# Patient Record
Sex: Male | Born: 1955 | Race: Black or African American | Hispanic: No | Marital: Single | Smoking: Former smoker
Health system: Southern US, Community
[De-identification: ages and names within clinical notes are randomized; demographics above are authoritative.]

## PROBLEM LIST (undated history)

## (undated) DIAGNOSIS — G4733 Obstructive sleep apnea (adult) (pediatric): Secondary | ICD-10-CM

## (undated) DIAGNOSIS — I5189 Other ill-defined heart diseases: Secondary | ICD-10-CM

## (undated) DIAGNOSIS — E119 Type 2 diabetes mellitus without complications: Secondary | ICD-10-CM

## (undated) DIAGNOSIS — Z9289 Personal history of other medical treatment: Secondary | ICD-10-CM

## (undated) DIAGNOSIS — I10 Essential (primary) hypertension: Secondary | ICD-10-CM

## (undated) DIAGNOSIS — I2510 Atherosclerotic heart disease of native coronary artery without angina pectoris: Secondary | ICD-10-CM

## (undated) DIAGNOSIS — E785 Hyperlipidemia, unspecified: Secondary | ICD-10-CM

## (undated) HISTORY — PX: HERNIA REPAIR: SHX51

## (undated) HISTORY — DX: Hyperlipidemia, unspecified: E78.5

## (undated) HISTORY — DX: Type 2 diabetes mellitus without complications: E11.9

## (undated) HISTORY — DX: Other ill-defined heart diseases: I51.89

---

## 2015-01-05 ENCOUNTER — Observation Stay
Admission: EM | Admit: 2015-01-05 | Discharge: 2015-01-09 | Disposition: A | Discharge status: Other Acute Inpatient Hospital | Payer: Medicaid Other | Attending: Internal Medicine | Admitting: Internal Medicine

## 2015-01-05 ENCOUNTER — Encounter: Payer: Self-pay | Admitting: *Deleted

## 2015-01-05 ENCOUNTER — Emergency Department: Payer: Medicaid Other

## 2015-01-05 DIAGNOSIS — Z6841 Body Mass Index (BMI) 40.0 and over, adult: Secondary | ICD-10-CM | POA: Diagnosis not present

## 2015-01-05 DIAGNOSIS — I25110 Atherosclerotic heart disease of native coronary artery with unstable angina pectoris: Secondary | ICD-10-CM | POA: Diagnosis not present

## 2015-01-05 DIAGNOSIS — M25512 Pain in left shoulder: Secondary | ICD-10-CM | POA: Diagnosis not present

## 2015-01-05 DIAGNOSIS — I10 Essential (primary) hypertension: Secondary | ICD-10-CM | POA: Diagnosis not present

## 2015-01-05 DIAGNOSIS — E119 Type 2 diabetes mellitus without complications: Secondary | ICD-10-CM | POA: Insufficient documentation

## 2015-01-05 DIAGNOSIS — I252 Old myocardial infarction: Secondary | ICD-10-CM | POA: Diagnosis not present

## 2015-01-05 DIAGNOSIS — I200 Unstable angina: Secondary | ICD-10-CM | POA: Diagnosis present

## 2015-01-05 DIAGNOSIS — G4733 Obstructive sleep apnea (adult) (pediatric): Secondary | ICD-10-CM | POA: Insufficient documentation

## 2015-01-05 DIAGNOSIS — I259 Chronic ischemic heart disease, unspecified: Secondary | ICD-10-CM | POA: Diagnosis not present

## 2015-01-05 DIAGNOSIS — R079 Chest pain, unspecified: Secondary | ICD-10-CM | POA: Diagnosis not present

## 2015-01-05 DIAGNOSIS — E1151 Type 2 diabetes mellitus with diabetic peripheral angiopathy without gangrene: Secondary | ICD-10-CM | POA: Diagnosis present

## 2015-01-05 DIAGNOSIS — Z8249 Family history of ischemic heart disease and other diseases of the circulatory system: Secondary | ICD-10-CM | POA: Diagnosis not present

## 2015-01-05 DIAGNOSIS — E785 Hyperlipidemia, unspecified: Secondary | ICD-10-CM | POA: Insufficient documentation

## 2015-01-05 DIAGNOSIS — R9431 Abnormal electrocardiogram [ECG] [EKG]: Secondary | ICD-10-CM | POA: Diagnosis not present

## 2015-01-05 HISTORY — DX: Personal history of other medical treatment: Z92.89

## 2015-01-05 HISTORY — DX: Morbid (severe) obesity due to excess calories: E66.01

## 2015-01-05 HISTORY — DX: Obstructive sleep apnea (adult) (pediatric): G47.33

## 2015-01-05 HISTORY — DX: Essential (primary) hypertension: I10

## 2015-01-05 HISTORY — DX: Atherosclerotic heart disease of native coronary artery without angina pectoris: I25.10

## 2015-01-05 MED ORDER — ONDANSETRON HCL 4 MG PO TABS
4.0000 mg | ORAL_TABLET | Freq: Four times a day (QID) | ORAL | Status: DC | PRN
Start: 2015-01-05 — End: 2015-01-09

## 2015-01-05 MED ORDER — NITROGLYCERIN 2 % TD OINT
0.5000 [in_us] | TOPICAL_OINTMENT | Freq: Once | TRANSDERMAL | Status: AC
  Administered 2015-01-05: 0.5 [in_us] via TOPICAL

## 2015-01-05 MED ORDER — SODIUM CHLORIDE 0.9 % IJ SOLN
3.0000 mL | Freq: Two times a day (BID) | INTRAMUSCULAR | Status: DC
Start: 2015-01-05 — End: 2015-01-09
  Administered 2015-01-06 – 2015-01-08 (×5): 3 mL via INTRAVENOUS

## 2015-01-05 MED ORDER — ACETAMINOPHEN 325 MG PO TABS
650.0000 mg | ORAL_TABLET | Freq: Four times a day (QID) | ORAL | Status: DC | PRN
Start: 2015-01-05 — End: 2015-01-09
  Administered 2015-01-07: 650 mg via ORAL
  Filled 2015-01-05: qty 2

## 2015-01-05 MED ORDER — SODIUM CHLORIDE 0.9 % IV SOLN
Freq: Once | INTRAVENOUS | Status: AC
  Administered 2015-01-05: 22:00:00 via INTRAVENOUS

## 2015-01-05 MED ORDER — ASPIRIN EC 81 MG PO TBEC
81.0000 mg | DELAYED_RELEASE_TABLET | Freq: Every day | ORAL | Status: DC
Start: 2015-01-06 — End: 2015-01-09
  Administered 2015-01-06 – 2015-01-09 (×4): 81 mg via ORAL
  Filled 2015-01-05 (×4): qty 1

## 2015-01-05 MED ORDER — ACETAMINOPHEN 650 MG RE SUPP
650.0000 mg | Freq: Four times a day (QID) | RECTAL | Status: DC | PRN
Start: 2015-01-05 — End: 2015-01-09

## 2015-01-05 MED ORDER — ONDANSETRON HCL 4 MG/2ML IJ SOLN: 4.0000 mg | Freq: Four times a day (QID) | INTRAMUSCULAR | Status: DC | PRN

## 2015-01-05 MED ORDER — NITROGLYCERIN 2 % TD OINT
TOPICAL_OINTMENT | TRANSDERMAL | Status: AC
  Filled 2015-01-05: qty 1

## 2015-01-05 MED ORDER — MORPHINE SULFATE 2 MG/ML IJ SOLN: 2.0000 mg | INTRAMUSCULAR | Status: DC | PRN

## 2015-01-05 MED ORDER — HEPARIN SODIUM (PORCINE) 5000 UNIT/ML IJ SOLN
5000.0000 [IU] | Freq: Three times a day (TID) | INTRAMUSCULAR | Status: DC
  Administered 2015-01-06 – 2015-01-09 (×12): 5000 [IU] via SUBCUTANEOUS
  Filled 2015-01-05 (×11): qty 1

## 2015-01-05 NOTE — ED Provider Notes (Signed)
[facility] Emergency Department Provider Note  ____________________________________________  Time seen: Approximately 11:07 PM  I have reviewed the triage vital signs and the nursing notes.   HISTORY  Chief Complaint Chest Pain    HPI Craig Harvey is a 59 y.o. male who reports he developed pain in his chest and left shoulder starting last week while he was walking. The pain comes on with shortness of breath and some lightheadedness it appears to get worse with exercise and better when he rests. It also hurts worse if he moves his left arm. HEENT she is becoming more intense and come on after less exercise in the last few days. The patient does not smoke. He does have a history of hypertension. The pain is heavy and tight in nature. Currently there is no pain. Although there was some pain with any came into the emergency room. He reported there was pain initially and went away with nitroglycerin in EMS and then came back again patient has had aspirin with EMS.   Past Medical History  Diagnosis Date  . Hypertension     Patient Active Problem List   Diagnosis Date Noted  . Chest pain, central 01/05/2015    Past Surgical History  Procedure Laterality Date  . Hernia repair      No current outpatient prescriptions on file.  Allergies Review of patient's allergies indicates no known allergies.  Family History  Problem Relation Age of Onset  . Diabetes Neg Hx   . CAD Mother   . CAD Brother     Social History History  Substance Use Topics  . Smoking status: Never Smoker   . Smokeless tobacco: Not on file  . Alcohol Use: No    Review of Systems Constitutional: No fever/chills Eyes: No visual changes. ENT: No sore throat. Cardiovascular: Denies chest pain. Respiratory: Denies shortness of breath. Gastrointestinal: No abdominal pain.  No nausea, no vomiting.  No diarrhea.  No constipation. Genitourinary: Negative for  dysuria. Musculoskeletal: Negative for back pain. Skin: Negative for rash. Neurological: Negative for headaches, focal weakness or numbness.  10-point ROS otherwise negative.  ____________________________________________   PHYSICAL EXAM:  VITAL SIGNS: ED Triage Vitals  Enc Vitals Group     BP 01/05/15 2201 174/92 mmHg     Pulse Rate 01/05/15 2201 101     Resp 01/05/15 2201 16     Temp 01/05/15 2201 99.5 F (37.5 C)     Temp Source 01/05/15 2201 Oral     SpO2 01/05/15 2201 96 %     Weight 01/05/15 2201 320 lb (145.151 kg)     Height 01/05/15 2201 5\' 9"  (1.753 m)     Head Cir --      Peak Flow --      Pain Score --      Pain Loc --      Pain Edu? --      Excl. in GC? --    Constitutional: Alert and oriented. Well appearing and in no acute distress. Eyes: Conjunctivae are normal. PERRL. EOMI. Head: Atraumatic. Nose: No congestion/rhinnorhea. Mouth/Throat: Mucous membranes are moist.  Oropharynx non-erythematous. Neck: No stridor.  Cardiovascular: Normal rate, regular rhythm. Grossly normal heart sounds.  Good peripheral circulation. Respiratory: Normal respiratory effort.  No retractions. Lungs CTAB. Gastrointestinal: Soft and nontender. No distention. No abdominal bruits. No CVA tenderness. Musculoskeletal: No lower extremity tenderness nor edema.  No joint effusions. Neurologic:  Normal speech and language. No gross focal neurologic deficits are appreciated.  Speech is normal. No gait instability. Skin:  Skin is warm, dry and intact. No rash noted. Psychiatric: Mood and affect are normal. Speech and behavior are normal.  ____________________________________________   LABS (all labs ordered are listed, but only abnormal results are displayed)  Labs Reviewed  COMPREHENSIVE METABOLIC PANEL - Abnormal; Notable for the following:    Sodium 132 (*)    Chloride 97 (*)    Glucose, Bld 240 (*)    AST 86 (*)    ALT 102 (*)    All other components within normal limits   CBC WITH DIFFERENTIAL/PLATELET - Abnormal; Notable for the following:    Platelets 135 (*)    All other components within normal limits  TROPONIN I  TROPONIN I  TROPONIN I  TROPONIN I   ____________________________________________  EKG  EKG read and interpreted by me shows sinus tach at a rate of 101 normal axis he has flattened T waves in lead 2 and flipped T waves in lead III and F possibly inferior ischemic changes is also flattening in V5 and V6 of the T waves ____________________________________________  RADIOLOGY  Radiology reads chest x-ray is normal   PROCEDURES  ____________________________________________   INITIAL IMPRESSION / ASSESSMENT AND PLAN / ED COURSE  Pertinent labs & imaging results that were available during my care of the patient were reviewed by me and considered in my medical decision making (see chart for details).   ____________________________________________   FINAL CLINICAL IMPRESSION(S) / ED DIAGNOSES  Final diagnoses:  Crescendo angina      Arnaldo Natal, MD 01/06/15 [phone]

## 2015-01-05 NOTE — H&P (Signed)
[facility] - [place] at [facility]   PATIENT NAME: Craig Harvey    MR#:  [id number]  DATE OF BIRTH:  06/25/1956   DATE OF ADMISSION:  01/05/2015  PRIMARY CARE PHYSICIAN: No primary care provider on file.   REQUESTING/REFERRING PHYSICIAN: Malinda  CHIEF COMPLAINT:   Chief Complaint  Patient presents with  . Chest Pain    HISTORY OF PRESENT ILLNESS:  Craig Harvey  is a 59 y.o. male with a known history of essential hypertension who is presenting with chest pain. Patient presented complaining of chest pain which is retrosternal in location, described as tightness in quality, 7 out of 10 intensity, radiation to left sided chest shoulder no worsening factors some relief with rest. Describes chest pain as exertional in nature worsening in intensity over the last 1 day. He had intermittent symptoms for about one week and total however as intensity worsens at present to Hospital further workup and evaluation. Denies any shortness of breath, nausea, vomiting, diaphoresis for this pathology at this time currently chest pain-free  PAST MEDICAL HISTORY:   Past Medical History  Diagnosis Date  . Hypertension     PAST SURGICAL HISTORY:   Past Surgical History  Procedure Laterality Date  . Hernia repair      SOCIAL HISTORY:   History  Substance Use Topics  . Smoking status: Never Smoker   . Smokeless tobacco: Not on file  . Alcohol Use: No    FAMILY HISTORY:   Family History  Problem Relation Age of Onset  . Diabetes Neg Hx   . CAD Mother   . CAD Brother     DRUG ALLERGIES:  No Known Allergies  REVIEW OF SYSTEMS:  REVIEW OF SYSTEMS:  CONSTITUTIONAL: Denies fevers, chills, fatigue, weakness.  EYES: Denies blurred vision, double vision, or eye pain.  EARS, NOSE, THROAT: Denies tinnitus, ear pain, hearing loss.  RESPIRATORY: denies cough, shortness of breath, wheezing  CARDIOVASCULAR: Positive chest pain, denies palpitations, edema.   GASTROINTESTINAL: Denies nausea, vomiting, diarrhea, abdominal pain.  GENITOURINARY: Denies dysuria, hematuria.  ENDOCRINE: Denies nocturia or thyroid problems. HEMATOLOGIC AND LYMPHATIC: Denies easy bruising or bleeding.  SKIN: Denies rash or lesions.  MUSCULOSKELETAL: Denies pain in neck, back, shoulder, knees, hips, or further arthritic symptoms.  NEUROLOGIC: Denies paralysis, paresthesias.  PSYCHIATRIC: Denies anxiety or depressive symptoms. Otherwise full review of systems performed by me is negative.   MEDICATIONS AT HOME:   Prior to Admission medications   Not on File      VITAL SIGNS:  Blood pressure 148/93, pulse 92, temperature 99.5 F (37.5 C), temperature source Oral, resp. rate 22, height  (1.753 m), weight 320 lb (145.151 kg), SpO2 97 %.  PHYSICAL EXAMINATION:  VITAL SIGNS: Filed Vitals:   01/05/15 2300  BP: 148/93  Pulse: 92  Temp:   Resp: 22   GENERAL:58 y.o.male currently in no acute distress.  HEAD: Normocephalic, atraumatic.  EYES: Pupils equal, round, reactive to light. Extraocular muscles intact. No scleral icterus.  MOUTH: Moist mucosal membrane. Dentition intact. No abscess noted.  EAR, NOSE, THROAT: Clear without exudates. No external lesions.  NECK: Supple. No thyromegaly. No nodules. No JVD.  PULMONARY: Clear to ascultation, without wheeze rails or rhonci. No use of accessory muscles, Good respiratory effort. good air entry bilaterally CHEST: Nontender to palpation.  CARDIOVASCULAR: S1 and S2. Regular rate and rhythm. No murmurs, rubs, or gallops. No edema. Pedal pulses 2+ bilaterally.  GASTROINTESTINAL: Soft, nontender, nondistended. No masses. Positive bowel  sounds. No hepatosplenomegaly.  MUSCULOSKELETAL: No swelling, clubbing, or edema. Range of motion full in all extremities. Able to elicit some pain left shoulder with abduction and internal rotation however minimal and not in the same spot/quality compared to previous  symptoms NEUROLOGIC: Cranial nerves II through XII are intact. No gross focal neurological deficits. Sensation intact. Reflexes intact.  SKIN: No ulceration, lesions, rashes, or cyanosis. Skin warm and dry. Turgor intact.  PSYCHIATRIC: Mood, affect within normal limits. The patient is awake, alert and oriented x 3. Insight, judgment intact.    LABORATORY PANEL:   CBC No results for input(s): WBC, HGB, HCT, PLT in the last 168 hours. ------------------------------------------------------------------------------------------------------------------  Chemistries  No results for input(s): NA, K, CL, CO2, GLUCOSE, BUN, CREATININE, CALCIUM, MG, AST, ALT, ALKPHOS, BILITOT in the last 168 hours.  Invalid input(s): GFRCGP ------------------------------------------------------------------------------------------------------------------  Cardiac Enzymes No results for input(s): TROPONINI in the last 168 hours. ------------------------------------------------------------------------------------------------------------------  RADIOLOGY:  Dg Chest Portable 1 View  01/05/2015   CLINICAL DATA:  Intermittent chest pain for 3 days. History of hypertension.  EXAM: PORTABLE CHEST - 1 VIEW  COMPARISON:  None.  FINDINGS: The heart size and mediastinal contours are within normal limits. Both lungs are clear. The visualized skeletal structures are unremarkable.  IMPRESSION: Normal portable chest radiograph.   Electronically Signed   By: Awilda Metroourtnay  Bloomer M.D.   On: 01/05/2015 22:39    EKG:   Orders placed or performed during the hospital encounter of 01/05/15  . ED EKG  . ED EKG    IMPRESSION AND PLAN:   59 year old gentleman history of essential hypertension with chest pain.  1. Chest pain, central: Aspirin statin therapy, place on telemetry, trend cardiac enzymes 3, exercise stress test if enzymes are normal 2. Venous thromboembolism prophylactic: Heparin subcutaneous    All the records are  reviewed and case discussed with ED provider. Management plans discussed with the patient, family and they are in agreement.  CODE STATUS: Full  TOTAL TIME TAKING CARE OF THIS PATIENT: 35 minutes.    Hower,  Mardi MainlandDavid K M.D on 01/05/2015 at 11:55 PM  Between 7am to 6pm - Pager - [phone]  After 6pm: House Pager: - [phone]  [facility]  Office  [phone]  CC: Primary care physician; No primary care provider on file.

## 2015-01-05 NOTE — ED Notes (Signed)
MD Malinda at bedside at this time.  

## 2015-01-05 NOTE — ED Notes (Signed)
Pt to ED from home with chest pain intermittent since Friday. Pt denies SOB, n/v, diaphoresis. Pt states pain left upper central chest, "tightness" Pt given 325 mg aspirin and 1 nitro and feeling better since then. Pt AAOx3, vitals wnl. Pt with hx of hypertension, under extra stress recently.

## 2015-01-06 ENCOUNTER — Observation Stay (HOSPITAL_BASED_OUTPATIENT_CLINIC_OR_DEPARTMENT_OTHER)
Admit: 2015-01-06 | Discharge: 2015-01-06 | Disposition: A | Discharge status: Home / Self Care | Payer: Medicaid Other | Attending: Cardiovascular Disease | Admitting: Cardiovascular Disease

## 2015-01-06 DIAGNOSIS — G4733 Obstructive sleep apnea (adult) (pediatric): Secondary | ICD-10-CM | POA: Insufficient documentation

## 2015-01-06 DIAGNOSIS — E119 Type 2 diabetes mellitus without complications: Secondary | ICD-10-CM

## 2015-01-06 DIAGNOSIS — E1151 Type 2 diabetes mellitus with diabetic peripheral angiopathy without gangrene: Secondary | ICD-10-CM | POA: Diagnosis present

## 2015-01-06 DIAGNOSIS — Z6841 Body Mass Index (BMI) 40.0 and over, adult: Secondary | ICD-10-CM

## 2015-01-06 DIAGNOSIS — R9431 Abnormal electrocardiogram [ECG] [EKG]: Secondary | ICD-10-CM

## 2015-01-06 DIAGNOSIS — R079 Chest pain, unspecified: Secondary | ICD-10-CM

## 2015-01-06 LAB — CBC WITH DIFFERENTIAL/PLATELET
BASOS ABS: 0.1 10*3/uL (ref 0–0.1)
Basophils Relative: 2 %
Eosinophils Absolute: 0.1 10*3/uL (ref 0–0.7)
Eosinophils Relative: 2 %
HEMATOCRIT: 44.1 % (ref 40.0–52.0)
Hemoglobin: 14.4 g/dL (ref 13.0–18.0)
Lymphocytes Relative: 28 %
Lymphs Abs: 1.5 10*3/uL (ref 1.0–3.6)
MCH: 28.6 pg (ref 26.0–34.0)
MCHC: 32.5 g/dL (ref 32.0–36.0)
MCV: 88 fL (ref 80.0–100.0)
Monocytes Absolute: 0.5 10*3/uL (ref 0.2–1.0)
Monocytes Relative: 8 %
NEUTROS ABS: 3.3 10*3/uL (ref 1.4–6.5)
NEUTROS PCT: 60 %
Platelets: 135 10*3/uL — ABNORMAL LOW (ref 150–440)
RBC: 5.01 MIL/uL (ref 4.40–5.90)
RDW: 14.3 % (ref 11.5–14.5)
WBC: 5.6 10*3/uL (ref 3.8–10.6)

## 2015-01-06 LAB — COMPREHENSIVE METABOLIC PANEL
ALT: 102 U/L — ABNORMAL HIGH (ref 17–63)
AST: 86 U/L — AB (ref 15–41)
Albumin: 4.1 g/dL (ref 3.5–5.0)
Alkaline Phosphatase: 71 U/L (ref 38–126)
Anion gap: 10 (ref 5–15)
BILIRUBIN TOTAL: 0.5 mg/dL (ref 0.3–1.2)
BUN: 11 mg/dL (ref 6–20)
CALCIUM: 8.9 mg/dL (ref 8.9–10.3)
CHLORIDE: 97 mmol/L — AB (ref 101–111)
CO2: 25 mmol/L (ref 22–32)
Creatinine, Ser: 0.92 mg/dL (ref 0.61–1.24)
GFR calc non Af Amer: >60 mL/min (ref >60)
GLUCOSE: 240 mg/dL — AB (ref 65–99)
POTASSIUM: 3.7 mmol/L (ref 3.5–5.1)
Sodium: 132 mmol/L — ABNORMAL LOW (ref 135–145)
Total Protein: 7.7 g/dL (ref 6.5–8.1)

## 2015-01-06 LAB — TROPONIN I
Troponin I: <0.03 ng/mL (ref <0.031)
Troponin I: <0.03 ng/mL (ref <0.031)

## 2015-01-06 MED ORDER — NITROGLYCERIN 0.4 MG SL SUBL
0.4000 mg | SUBLINGUAL_TABLET | SUBLINGUAL | Status: DC | PRN
Start: 2015-01-06 — End: 2015-01-09

## 2015-01-06 MED ORDER — HEPARIN SODIUM (PORCINE) 5000 UNIT/ML IJ SOLN
INTRAMUSCULAR | Status: AC
  Filled 2015-01-06: qty 1

## 2015-01-06 MED ORDER — METOPROLOL SUCCINATE ER 25 MG PO TB24
25.0000 mg | ORAL_TABLET | Freq: Every day | ORAL | Status: DC
  Administered 2015-01-06 – 2015-01-09 (×4): 25 mg via ORAL
  Filled 2015-01-06 (×4): qty 1

## 2015-01-06 NOTE — ED Notes (Signed)
Report given to 2A 

## 2015-01-06 NOTE — H&P (Addendum)
[facility] - [place] at [facility]  Progress note   PATIENT NAME: Craig Harvey    MR#:  [id number]  DATE OF BIRTH:  [birth year]  SUBJECTIVE:  CHIEF COMPLAINT:   Chief Complaint  Patient presents with  . Chest Pain   No further chest pain today at rest. Has typical symptoms  left-sided pain radiating to left arm on exertion. Relieved with rest. REVIEW OF SYSTEMS:    Review of Systems  Constitutional: Negative for fever and chills.  HENT: Negative for sore throat.   Eyes: Negative for blurred vision, double vision and pain.  Respiratory: Negative for cough, hemoptysis, shortness of breath and wheezing.   Cardiovascular: Positive for chest pain (now resolved). Negative for palpitations, orthopnea and leg swelling.  Gastrointestinal: Negative for heartburn, nausea, vomiting, abdominal pain, diarrhea and constipation.  Genitourinary: Negative for dysuria and hematuria.  Musculoskeletal: Negative for back pain and joint pain.  Skin: Negative for rash.  Neurological: Negative for sensory change, speech change, focal weakness and headaches.  Endo/Heme/Allergies: Does not bruise/bleed easily.  Psychiatric/Behavioral: Negative for depression. The patient is not nervous/anxious.       DRUG ALLERGIES:  No Known Allergies  VITALS:  Blood pressure 149/88, pulse 71, temperature 97.7 F (36.5 C), temperature source Oral, resp. rate 20, height  (1.753 m), weight 145.151 kg (320 lb), SpO2 99 %.  PHYSICAL EXAMINATION:   Physical Exam  GENERAL:  59 y.o.-year-old patient lying in the bed with no acute distress. OBESE EYES: Pupils equal, round, reactive to light and accommodation. No scleral icterus. Extraocular muscles intact.  HEENT: Head atraumatic, normocephalic. Oropharynx and nasopharynx clear.  NECK:  Supple, no jugular venous distention. No thyroid enlargement, no tenderness.  LUNGS: Normal breath sounds bilaterally, no wheezing, rales, rhonchi. No  use of accessory muscles of respiration.  CARDIOVASCULAR: S1, S2 normal. No murmurs, rubs, or gallops.  ABDOMEN: Soft, nontender, nondistended. Bowel sounds present. No organomegaly or mass.  EXTREMITIES: No cyanosis, clubbing or edema b/l.    NEUROLOGIC: Cranial nerves II through XII are intact. No focal Motor or sensory deficits b/l.   PSYCHIATRIC: The patient is alert and oriented x 3.  SKIN: No obvious rash, lesion, or ulcer.    LABORATORY PANEL:   CBC  Recent Labs Lab 01/05/15 2206  WBC 5.6  HGB 14.4  HCT 44.1  PLT 135*   ------------------------------------------------------------------------------------------------------------------  Chemistries   Recent Labs Lab 01/05/15 2206  NA 132*  K 3.7  CL 97*  CO2 25  GLUCOSE 240*  BUN 11  CREATININE 0.92  CALCIUM 8.9  AST 86*  ALT 102*  ALKPHOS 71  BILITOT 0.5   ------------------------------------------------------------------------------------------------------------------  Cardiac Enzymes  Recent Labs Lab 01/06/15 0703  TROPONINI <0.03   ------------------------------------------------------------------------------------------------------------------  RADIOLOGY:  Dg Chest Portable 1 View  01/05/2015   CLINICAL DATA:  Intermittent chest pain for 3 days. History of hypertension.  EXAM: PORTABLE CHEST - 1 VIEW  COMPARISON:  None.  FINDINGS: The heart size and mediastinal contours are within normal limits. Both lungs are clear. The visualized skeletal structures are unremarkable.  IMPRESSION: Normal portable chest radiograph.   Electronically Signed   By: Awilda Metro M.D.   On: 01/05/2015 22:39     ASSESSMENT AND PLAN:   59 year old obese patient with untreated hypertension and sleep apnea admitted for chest pain  * Typical chest pain Exertional, left-sided, radiates to his left arm. Significant risk factors. Although his troponin and EKG are normal which are reassuring I believe  his typical chest  pain needs further investigation. I discussed with Dr. Mariah MillingGollan regarding a cardiac catheterization. Further management as per cardiology recommendation. Start aspirin, statin, beta blocker. Nitroglycerin when necessary  * Hypertension Start Toprol. Add hydrochlorothiazide if still uncontrolled.  * Sleep apnea Patient is on CPAP at home.  All the records are reviewed and case discussed with Care Management/Social Workerr. Management plans discussed with the patient, family and they are in agreement.  CODE STATUS: FULL  DVT Prophylaxis: SCDs  TOTAL TIME TAKING CARE OF THIS PATIENT: 40 minutes.   POSSIBLE D/C IN 1-2 DAYS, DEPENDING ON CLINICAL CONDITION.   Milagros LollSudini, Merik Mignano R M.D on 01/06/2015 at 10:59 AM  Between 7am to 6pm - Pager - [phone]  After 6pm go to [email or web address] - password [email or web address] [facility]  [facility]  Office  [phone]  CC: Primary care physician; No primary care provider on file.

## 2015-01-06 NOTE — Consult Note (Signed)
Cardiology Consultation Note  Patient ID: Craig Harvey, MRN: [id number], DOB/AGE: 57-Jun-1957 57 y.o. Admit date: 01/05/2015   Date of Consult: 01/06/2015 Primary Physician: No primary care provider on file. Primary Cardiologist: none  Chief Complaint: nasuea, hot, left shoulder pain Reason for Consult: chest and left shoulder pain  HPI: 59 y.o. male with h/o known history of essential hypertension who is presenting with nausea, feeling hot, left shoulder and left chest pain.Cardiology consulted for left side chest pain, possible angina.  He reports lifting heavy newspapers several weeks ago, 4 bags, each was 100 to 150 pounds. He is uncertain, but his left shoulder and chest pain seemed to start after that time. He lifts weight, and has noticed that doing standing butterflies also hurts in the same area. He goes walking daily, typically with no problems, recently felt hot. He reports mother is a Ecologist"hoarder" and barely enough room to get around in his room. Minimal AC in the house. Brother sits in front of his [facility] all day. He is very uncomfortably hot without AC, yesterday was hot and developed nausea, felt like he was going to pass out.  Since he has been in the hospital, he was felt well with no sx of SOB, feeling hot or chest pain. Cardiac enz neg x 3   Past Medical History  Diagnosis Date  . Hypertension       Most Recent Cardiac Studies: ECHO pending   Surgical History:  Past Surgical History  Procedure Laterality Date  . Hernia repair       Home Meds: Prior to Admission medications   Not on File    Inpatient Medications:  . aspirin EC  81 mg Oral Daily  . heparin      . heparin      . heparin  5,000 Units Subcutaneous 3 times per day  . metoprolol succinate  25 mg Oral Daily  . sodium chloride  3 mL Intravenous Q12H      Allergies: No Known Allergies  History   Social History  . Marital Status: Single    Spouse Name: N/A  . Number of Children: N/A  . Years  of Education: N/A   Occupational History  . Not on file.   Social History Main Topics  . Smoking status: Never Smoker   . Smokeless tobacco: Not on file  . Alcohol Use: No  . Drug Use: Not on file  . Sexual Activity: Not on file   Other Topics Concern  . Not on file   Social History Narrative  . No narrative on file     Family History  Problem Relation Age of Onset  . Diabetes Neg Hx   . CAD Mother   . CAD Brother      Review of Systems:NSR rate 82 bpm Review of Systems  Constitutional: Negative.   Respiratory: Negative.   Cardiovascular: Positive for chest pain.  Gastrointestinal: Positive for nausea.  Musculoskeletal: Positive for myalgias.  Neurological: Negative.    All other systems were reviewed and negative.   Labs:  Recent Labs  01/05/15 2206 01/06/15 0144 01/06/15 0703  TROPONINI <0.03 <0.03 <0.03   Lab Results  Component Value Date   WBC 5.6 01/05/2015   HGB 14.4 01/05/2015   HCT 44.1 01/05/2015   MCV 88.0 01/05/2015   PLT 135* 01/05/2015    Recent Labs Lab 01/05/15 2206  NA 132*  K 3.7  CL 97*  CO2 25  BUN 11  CREATININE 0.92  CALCIUM 8.9  PROT 7.7  BILITOT 0.5  ALKPHOS 71  ALT 102*  AST 86*  GLUCOSE 240*   No results found for: CHOL, HDL, LDLCALC, TRIG No results found for: DDIMER  Radiology/Studies:  Dg Chest Portable 1 View  01/05/2015   CLINICAL DATA:  Intermittent chest pain for 3 days. History of hypertension.  EXAM: PORTABLE CHEST - 1 VIEW  COMPARISON:  None.  FINDINGS: The heart size and mediastinal contours are within normal limits. Both lungs are clear. The visualized skeletal structures are unremarkable.  IMPRESSION: Normal portable chest radiograph.   Electronically Signed   By: Awilda Metro M.D.   On: 01/05/2015 22:39    EKG: NSR with possible old inferior MI  Weights: Filed Weights   01/05/15 2201  Weight: 320 lb (145.151 kg)     Physical Exam:NSR ON TELE  Blood pressure 149/88, pulse 71, temperature  97.7 F (36.5 C), temperature source Oral, resp. rate 20, height  (1.753 m), weight 320 lb (145.151 kg), SpO2 99 %. Body mass index is 47.23 kg/(m^2). General: Well developed, well nourished, in no acute distress. Head: Normocephalic, atraumatic, sclera non-icteric, no xanthomas, nares are without discharge.  Neck: Negative for carotid bruits. JVD not elevated. Lungs: Clear bilaterally to auscultation without wheezes, rales, or rhonchi. Breathing is unlabored. Heart: RRR with S1 S2. No murmurs, rubs, or gallops appreciated. Abdomen: Soft, non-tender, non-distended with normoactive bowel sounds. No hepatomegaly. No rebound/guarding. No obvious abdominal masses. Msk:  Strength and tone appear normal for age. Extremities: No clubbing or cyanosis. No edema.  Distal pedal pulses are 2+ and equal bilaterally. Neuro: Alert and oriented X 3. No facial asymmetry. No focal deficit. Moves all extremities spontaneously. Psych:  Responds to questions appropriately with a normal affect.    Assessment and Plan:  1) Chest pain, Some atypical features, exacerbated by weight lifting, carrying wet papers, carrying jugs of water Left pectoral area, some discomfort on palpation at rest family hx of CAD, ABN EKG (inferior area),  --To rule out ischemia will order a treadmill myoview tomorrow.  2) Nausea, Felt hot yesterday No AC in his house, mom does not turn it on, house is hot Felt like he was going to pass out (main reason he came in to hospital) He and his other brother live with mom  3) HTN: He has no PMD No primary care Not on meds to the notes On metoprolol here, Could add amlodipine 10 mg daily  4) OSA: Does not use CPAP. Often wakes up feeling SOB Recommended compliance  5)Morbid obesity: Likely contributing to most of his medical issues: OSA, HTN, diabetes  6) Diabetes: Possibly undiagnosed, Needs HGA1C   Signed, Dossie Arbour [facility] 01/06/2015, 10:57 AM

## 2015-01-07 ENCOUNTER — Observation Stay: Payer: Medicaid Other

## 2015-01-07 ENCOUNTER — Other Ambulatory Visit: Payer: Self-pay

## 2015-01-07 LAB — GLUCOSE, CAPILLARY
GLUCOSE-CAPILLARY: 189 mg/dL — AB (ref 65–99)
GLUCOSE-CAPILLARY: 253 mg/dL — AB (ref 65–99)
Glucose-Capillary: 193 mg/dL — ABNORMAL HIGH (ref 65–99)
Glucose-Capillary: 135 mg/dL — ABNORMAL HIGH (ref 65–99)

## 2015-01-07 LAB — HEMOGLOBIN A1C: Hgb A1c MFr Bld: 10.1 % — ABNORMAL HIGH (ref 4.0–6.0)

## 2015-01-07 MED ORDER — ASPIRIN 81 MG PO TBEC: 81.0000 mg | DELAYED_RELEASE_TABLET | Freq: Every day | ORAL | Status: DC

## 2015-01-07 MED ORDER — AMLODIPINE BESYLATE 5 MG PO TABS: 5.0000 mg | ORAL_TABLET | Freq: Every day | ORAL | Status: DC

## 2015-01-07 MED ORDER — METFORMIN HCL 1000 MG PO TABS: 1000.0000 mg | ORAL_TABLET | Freq: Two times a day (BID) | ORAL | Status: DC

## 2015-01-07 MED ORDER — INSULIN ASPART 100 UNIT/ML ~~LOC~~ SOLN: 0.0000 [IU] | Freq: Every day | SUBCUTANEOUS | Status: DC

## 2015-01-07 MED ORDER — TECHNETIUM TC 99M SESTAMIBI - CARDIOLITE
30.6050 | Freq: Once | INTRAVENOUS | Status: AC | PRN
  Administered 2015-01-07: 30.605 via INTRAVENOUS

## 2015-01-07 MED ORDER — METOPROLOL SUCCINATE ER 25 MG PO TB24: 25.0000 mg | ORAL_TABLET | Freq: Every day | ORAL | Status: DC

## 2015-01-07 MED ORDER — METFORMIN HCL 500 MG PO TABS
1000.0000 mg | ORAL_TABLET | Freq: Two times a day (BID) | ORAL | Status: DC
  Administered 2015-01-07: 1000 mg via ORAL
  Filled 2015-01-07: qty 2

## 2015-01-07 MED ORDER — INSULIN ASPART 100 UNIT/ML ~~LOC~~ SOLN
0.0000 [IU] | Freq: Three times a day (TID) | SUBCUTANEOUS | Status: DC
  Administered 2015-01-07: 8 [IU] via SUBCUTANEOUS
  Administered 2015-01-07: 3 [IU] via SUBCUTANEOUS
  Administered 2015-01-08: 2 [IU] via SUBCUTANEOUS
  Administered 2015-01-08 – 2015-01-09 (×3): 3 [IU] via SUBCUTANEOUS
  Filled 2015-01-07: qty 3
  Filled 2015-01-07: qty 8
  Filled 2015-01-07 (×2): qty 3
  Filled 2015-01-07 (×2): qty 2

## 2015-01-07 NOTE — Progress Notes (Signed)
Stress test ordered to rule out ischemia Given his size, he requires a 2 day Myoview perfusion study stress images performed today showing mid to distal anterior wall, apical perfusion defect This will require resting images performed tomorrow for comparison If resting images show normal perfusion, this would imply ischemia and the need for cardiac catheterization

## 2015-01-07 NOTE — Progress Notes (Signed)
[facility] - [place] at [facility]   PATIENT NAME: Craig Harvey    MR#:  [id number]  DATE OF BIRTH:  [birth year]  SUBJECTIVE:  CHIEF COMPLAINT:   Chief Complaint  Patient presents with  . Chest Pain   Had some chest pain with stress test. No SOB. REVIEW OF SYSTEMS:    Review of Systems  Constitutional: Negative for fever, chills, weight loss and malaise/fatigue.  HENT: Negative for hearing loss and nosebleeds.   Eyes: Negative for blurred vision, double vision and pain.  Respiratory: Negative for cough, hemoptysis, sputum production, shortness of breath and wheezing.   Cardiovascular: Negative for chest pain, palpitations, orthopnea and leg swelling.  Gastrointestinal: Negative for nausea, vomiting, abdominal pain, diarrhea and constipation.  Genitourinary: Negative for dysuria and hematuria.  Musculoskeletal: Negative for myalgias, back pain and falls.  Skin: Negative for rash.  Neurological: Negative for dizziness, tremors, sensory change, speech change, focal weakness, seizures and headaches.  Endo/Heme/Allergies: Does not bruise/bleed easily.  Psychiatric/Behavioral: Negative for depression and memory loss. The patient is not nervous/anxious.       DRUG ALLERGIES:  No Known Allergies  VITALS:  Blood pressure 143/83, pulse 77, temperature 98.3 F (36.8 C), temperature source Oral, resp. rate 18, height  (1.753 m), weight 145.151 kg (320 lb), SpO2 97 %.  PHYSICAL EXAMINATION:   Physical Exam  GENERAL:  59 y.o.-year-old patient lying in the bed with no acute distress.  EYES: Pupils equal, round, reactive to light and accommodation. No scleral icterus. Extraocular muscles intact.  HEENT: Head atraumatic, normocephalic. Oropharynx and nasopharynx clear.  NECK:  Supple, no jugular venous distention. No thyroid enlargement, no tenderness.  LUNGS: Normal breath sounds bilaterally, no wheezing, rales, rhonchi. No use of accessory muscles of  respiration.  CARDIOVASCULAR: S1, S2 normal. No murmurs, rubs, or gallops.  ABDOMEN: Soft, nontender, nondistended. Bowel sounds present. No organomegaly or mass.  EXTREMITIES: No cyanosis, clubbing or edema b/l.    NEUROLOGIC: Cranial nerves II through XII are intact. No focal Motor or sensory deficits b/l.   PSYCHIATRIC: The patient is alert and oriented x 3.  SKIN: No obvious rash, lesion, or ulcer.    LABORATORY PANEL:   CBC  Recent Labs Lab 01/05/15 2206  WBC 5.6  HGB 14.4  HCT 44.1  PLT 135*   ------------------------------------------------------------------------------------------------------------------  Chemistries   Recent Labs Lab 01/05/15 2206  NA 132*  K 3.7  CL 97*  CO2 25  GLUCOSE 240*  BUN 11  CREATININE 0.92  CALCIUM 8.9  AST 86*  ALT 102*  ALKPHOS 71  BILITOT 0.5   ------------------------------------------------------------------------------------------------------------------  Cardiac Enzymes  Recent Labs Lab 01/06/15 0703  TROPONINI <0.03   ------------------------------------------------------------------------------------------------------------------  RADIOLOGY:  Dg Chest Portable 1 View  01/05/2015   CLINICAL DATA:  Intermittent chest pain for 3 days. History of hypertension.  EXAM: PORTABLE CHEST - 1 VIEW  COMPARISON:  None.  FINDINGS: The heart size and mediastinal contours are within normal limits. Both lungs are clear. The visualized skeletal structures are unremarkable.  IMPRESSION: Normal portable chest radiograph.   Electronically Signed   By: Awilda Metro M.D.   On: 01/05/2015 22:39     ASSESSMENT AND PLAN:   * Chest pain Has abnormal stress images. Due to obesity he has 2 day test. Rest images tomorrow. Further cath or medical management per tomorrows results. ASA, Statin, BB.  * HTN, Uncontrolled ON Toprol. Added norvasc  * New onset DM Start metformin Check HbA1c  *  Sleep apnea  * Morbid  obesity     All the records are reviewed and case discussed with Care Management/Social Workerr. Management plans discussed with the patient, family and they are in agreement.  CODE STATUS: FULL  DVT Prophylaxis: SCDs  TOTAL TIME TAKING CARE OF THIS PATIENT: 30 minutes.   POSSIBLE D/C IN 1-2 DAYS, DEPENDING ON CLINICAL CONDITION.   Milagros LollSudini, Birdia Jaycox R M.D on 01/07/2015 at 4:04 PM  Between 7am to 6pm - Pager - [phone]  After 6pm go to [email or web address] - password [email or web address] [facility]  [facility]  Office  [phone]  CC: Primary care physician; No primary care provider on file.

## 2015-01-08 ENCOUNTER — Encounter: Payer: Self-pay | Admitting: Physician Assistant

## 2015-01-08 ENCOUNTER — Encounter: Payer: Medicaid Other | Attending: Cardiovascular Disease

## 2015-01-08 DIAGNOSIS — Z951 Presence of aortocoronary bypass graft: Secondary | ICD-10-CM | POA: Diagnosis not present

## 2015-01-08 DIAGNOSIS — R079 Chest pain, unspecified: Secondary | ICD-10-CM

## 2015-01-08 LAB — NM MYOCAR MULTI W/SPECT W/WALL MOTION / EF
CHL CUP RESTING HR STRESS: 82 {beats}/min
Estimated workload: 6 METS
Exercise duration (min): 4 min
LVDIAVOL: 125 mL
LVSYSVOL: 50 mL
Peak HR: 144 {beats}/min
Percent HR: 87 %
SDS: 12
SRS: 0
SSS: 12
TID: 1

## 2015-01-08 LAB — GLUCOSE, CAPILLARY
Glucose-Capillary: 141 mg/dL — ABNORMAL HIGH (ref 65–99)
Glucose-Capillary: 176 mg/dL — ABNORMAL HIGH (ref 65–99)
Glucose-Capillary: 200 mg/dL — ABNORMAL HIGH (ref 65–99)
Glucose-Capillary: 177 mg/dL — ABNORMAL HIGH (ref 65–99)

## 2015-01-08 MED ORDER — SODIUM CHLORIDE 0.9 % IV SOLN: 250.0000 mL | INTRAVENOUS | Status: DC | PRN

## 2015-01-08 MED ORDER — SODIUM CHLORIDE 0.9 % IJ SOLN: 3.0000 mL | INTRAMUSCULAR | Status: DC | PRN

## 2015-01-08 MED ORDER — SODIUM CHLORIDE 0.9 % IJ SOLN
3.0000 mL | Freq: Two times a day (BID) | INTRAMUSCULAR | Status: DC
  Administered 2015-01-08 (×2): 3 mL via INTRAVENOUS

## 2015-01-08 MED ORDER — LIVING WELL WITH DIABETES BOOK
Freq: Once | Status: AC
  Administered 2015-01-08: 12:00:00
  Filled 2015-01-08: qty 1

## 2015-01-08 MED ORDER — ASPIRIN 81 MG PO CHEW
81.0000 mg | CHEWABLE_TABLET | ORAL | Status: AC
  Administered 2015-01-09: 81 mg via ORAL
  Filled 2015-01-08: qty 1

## 2015-01-08 MED ORDER — SODIUM CHLORIDE 0.9 % WEIGHT BASED INFUSION
1.0000 mL/kg/h | INTRAVENOUS | Status: DC
  Administered 2015-01-09 (×2): 1 mL/kg/h via INTRAVENOUS

## 2015-01-08 MED ORDER — ATORVASTATIN CALCIUM 20 MG PO TABS
40.0000 mg | ORAL_TABLET | Freq: Every day | ORAL | Status: DC
  Administered 2015-01-08: 40 mg via ORAL
  Filled 2015-01-08: qty 2

## 2015-01-08 MED ORDER — TECHNETIUM TC 99M SESTAMIBI - CARDIOLITE
32.3280 | Freq: Once | INTRAVENOUS | Status: AC | PRN
  Administered 2015-01-08: 10:00:00 32.328 via INTRAVENOUS

## 2015-01-08 MED ORDER — SODIUM CHLORIDE 0.9 % WEIGHT BASED INFUSION
3.0000 mL/kg/h | INTRAVENOUS | Status: AC
  Administered 2015-01-09: 3 mL/kg/h via INTRAVENOUS

## 2015-01-08 NOTE — Plan of Care (Signed)
Problem: Consults Goal: Nutrition Consult-if indicated Outcome: Progressing Pt placed on carb control diet, dietician consulted for diabetic diet education, pt eager to learn

## 2015-01-08 NOTE — Progress Notes (Signed)
    Nuclear stress test is high risk study with a large defect of moderate severity in the mid anterior, mid anteroseptal, apical anterior, apical inferior, apical lateral, and apex location. Findings are consistent with ischemia and prior myocardial infarction with peri-infarct ischemia. There was upsloping ST segment depression noted during stress in the inferolateral leads, returning to baseline after 5-9 minutes of recovery. Blood pressure demonstrated a hypertensive response with exercise with poor exercise tolerance. LVEF estimated at 54% (45-54%).   Based on the above findings it was recommended that he undergo cardiac catheterization. He is scheduled for cardiac cath on 7/7, third case with Dr. Mariah MillingGollan, MD. Risks and benefits of cardiac catheterization have been discussed with the patient including risks of bleeding, bruising, infection, kidney damage, stroke, heart attack, and death. The patient understands these risks and is willing to proceed with the procedure. All questions have been answered and concerns listened to. He is currently chest pain free. Should he develop any chest pain would start heparin gtt and work to get chest pain free. His metformin has been stopped as of this morning and will need to be held for 48 hours post cardiac cath. Long discussion with patient regarding his risk factors and lifestyle modification.    Eula Listenyan Taliana Mersereau, PA-C 01/08/2015 2:10 PM

## 2015-01-08 NOTE — Progress Notes (Signed)
Pt alert and oriented, NSR, no complaints on pain.  Lungs clear on room air, tolerating diet, bm today.  Pt to undergo cardiac cath tomorrow, consent obtained and pt educated on cath.  Pt newly diagnosed diabetic, pt provided education on diabetes and lifestyle modifications.  Dietician consulted pt for healthy eating habits, and awaiting diabetes nurse to come and see pt.  Pt very eager to learn about diabetes and make changes.

## 2015-01-08 NOTE — Discharge Instructions (Signed)

## 2015-01-08 NOTE — Progress Notes (Signed)
  RD consulted for nutrition education regarding diabetes.   Lab Results  Component Value Date   HGBA1C 10.1* 01/06/2015    RD provided "Carbohydrate Counting for People with Diabetes" handout from the Academy of Nutrition and Dietetics. Discussed different food groups and their effects on blood sugar, emphasizing carbohydrate-containing foods. Provided list of carbohydrates and recommended serving sizes of common foods.  Discussed importance of controlled and consistent carbohydrate intake throughout the day. Provided examples of ways to balance meals/snacks and encouraged intake of high-fiber, whole grain complex carbohydrates. Teach back method used.  Expect good compliance.  Romelle Starcherate Randon Somera MS, RD, LDN [phone] Pager

## 2015-01-08 NOTE — Progress Notes (Signed)
[facility] - [place] at [facility]   PATIENT NAME: Craig Harvey    MR#:  [id number]  DATE OF BIRTH:  12-12-1955  SUBJECTIVE:  CHIEF COMPLAINT:   Chief Complaint  Patient presents with  . Chest Pain   No CP today.  REVIEW OF SYSTEMS:    Review of Systems  Constitutional: Negative for fever, chills, weight loss and malaise/fatigue.  HENT: Negative for hearing loss and nosebleeds.   Eyes: Negative for blurred vision, double vision and pain.  Respiratory: Negative for cough, hemoptysis, sputum production, shortness of breath and wheezing.   Cardiovascular: Negative for chest pain, palpitations, orthopnea and leg swelling.  Gastrointestinal: Negative for nausea, vomiting, abdominal pain, diarrhea and constipation.  Genitourinary: Negative for dysuria and hematuria.  Musculoskeletal: Negative for myalgias, back pain and falls.  Skin: Negative for rash.  Neurological: Negative for dizziness, tremors, sensory change, speech change, focal weakness, seizures and headaches.  Endo/Heme/Allergies: Does not bruise/bleed easily.  Psychiatric/Behavioral: Negative for depression and memory loss. The patient is not nervous/anxious.       DRUG ALLERGIES:  No Known Allergies  VITALS:  Blood pressure 115/67, pulse 61, temperature 97.9 F (36.6 C), temperature source Oral, resp. rate 18, height  (1.753 m), weight 145.151 kg (320 lb), SpO2 98 %.  PHYSICAL EXAMINATION:   Physical Exam  GENERAL:  59 y.o.-year-old patient lying in the bed with no acute distress.  EYES: Pupils equal, round, reactive to light and accommodation. No scleral icterus. Extraocular muscles intact.  HEENT: Head atraumatic, normocephalic. Oropharynx and nasopharynx clear.  NECK:  Supple, no jugular venous distention. No thyroid enlargement, no tenderness.  LUNGS: Normal breath sounds bilaterally, no wheezing, rales, rhonchi. No use of accessory muscles of respiration.  CARDIOVASCULAR:  S1, S2 normal. No murmurs, rubs, or gallops.  ABDOMEN: Soft, nontender, nondistended. Bowel sounds present. No organomegaly or mass.  EXTREMITIES: No cyanosis, clubbing or edema b/l.    NEUROLOGIC: Cranial nerves II through XII are intact. No focal Motor or sensory deficits b/l.   PSYCHIATRIC: The patient is alert and oriented x 3.  SKIN: No obvious rash, lesion, or ulcer.    LABORATORY PANEL:   CBC  Recent Labs Lab 01/05/15 2206  WBC 5.6  HGB 14.4  HCT 44.1  PLT 135*   ------------------------------------------------------------------------------------------------------------------  Chemistries   Recent Labs Lab 01/05/15 2206  NA 132*  K 3.7  CL 97*  CO2 25  GLUCOSE 240*  BUN 11  CREATININE 0.92  CALCIUM 8.9  AST 86*  ALT 102*  ALKPHOS 71  BILITOT 0.5   ------------------------------------------------------------------------------------------------------------------  Cardiac Enzymes  Recent Labs Lab 01/06/15 0703  TROPONINI <0.03   ------------------------------------------------------------------------------------------------------------------  RADIOLOGY:  Nm Myocar Multi W/spect W/wall Motion / Ef  01/08/2015    This is a HIGH RISK study.  Defect 1: There is a large defect of moderate severity present in the  mid anterior, mid anteroseptal, mid anterolateral, apical anterior, apical  inferior, apical lateral and apex location.  Findings consistent with ischemia and prior myocardial infarction with  peri-infarct ischemia.  Upsloping ST segment depression ST segment depression was noted during  stress in the inferolateral leads (II, III, aVF, V5 and V6), and returning  to baseline after 5-9 minutes of recovery.  Blood pressure demonstrated a hypertensive response to exercise with  Poor Exercise tolerance.  The left ventricular ejection fraction is low normal - estimated EF ~53%  (45-54%).   HIGH RISK Test due to Poor Exercise Tolerance, Large-partially  reversible  perfusion defect in the likely LAD distribution.  Would consider Cardiac Catheterization.     ASSESSMENT AND PLAN:   * Chest pain Has abnormal stress test. Cath tomorrow ASA, Statin, BB.  * HTN, Uncontrolled On Toprol. Added norvasc  * New onset DM Started metformin. Now on hold for cath. HbA1c - 10  * Sleep apnea  * Morbid obesity     All the records are reviewed and case discussed with Care Management/Social Workerr. Management plans discussed with the patient, family and they are in agreement.  CODE STATUS: FULL  DVT Prophylaxis: SCDs  TOTAL TIME TAKING CARE OF THIS PATIENT: 30 minutes.   POSSIBLE D/C IN 1-2 DAYS, DEPENDING ON CLINICAL CONDITION.   Milagros LollSudini, Tanishi Nault R M.D on 01/08/2015 at 2:42 PM  Between 7am to 6pm - Pager - [phone]  After 6pm go to [email or web address] - password [email or web address] [facility]  [facility]  Office  [phone]  CC: Primary care physician; No primary care provider on file.

## 2015-01-08 NOTE — Progress Notes (Signed)
Nutrition Follow-up     INTERVENTION:   Education: provided verbal/written education on diabetic diet; discussed sources of carbs, basic carb counting, meal planning, label reading. Pt very receptive to education, all questions answered. Pt received Living Well with DM booklet and was reviewing upon visit this afternoon. Adherance likely. Pt would benefit from further education as outpatient; noted diabetes coordinator consulted this admission as well.   NUTRITION DIAGNOSIS:  Food and nutrition related knowledge deficit related to  (diabetes) as evidenced by  (newly dx DM, no previous education).   GOAL:  Patient will meet greater than or equal to 90% of their needs  MONITOR:   (Energy Intake, Anthropometrics, Glucose Profile, Electrolyte/Renal profile, Digestive System, Knowledge)  REASON FOR ASSESSMENT:  Consult Diet education  ASSESSMENT:  Pt admitted chest pain,stress test today, new onset DM with HgbA1c 10.1  PMHx:  Past Medical History  Diagnosis Date  . Hypertension   . History of echocardiogram     a. echo 01/2015: EF 60-65%, no RWMA, LA nl, nl PASP - nl study    Diet Order:  Diet - low sodium heart healthy Diet Carb Modified Diet Carb Modified Fluid consistency:: Thin; Room service appropriate?: Yes  Current Nutrition: reports good appetite, ate 100% at lunch today  Food/Nutrition-Related History: good appetite   Medications: metformin  Electrolyte/Renal Profile and Glucose Profile:   Recent Labs Lab 01/05/15 2206  NA 132*  K 3.7  CL 97*  CO2 25  BUN 11  CREATININE 0.92  CALCIUM 8.9  GLUCOSE 240*   Lab Results  Component Value Date   HGBA1C 10.1* 01/06/2015     Protein Profile:  Recent Labs Lab 01/05/15 2206  ALBUMIN 4.1    Nutrition-Focused Physical Exam Findings: Nutrition-Focused physical exam completed. Findings are WDL for fat depletion, muscle depletion, and edema.     Weight Change: pt reports weight fluctuates up and down  but no major changes Anthropometrics:   Height:  Ht Readings from Last 1 Encounters:  01/05/15 5\' 9"  (1.753 m)    Weight:  Wt Readings from Last 1 Encounters:  01/05/15 320 lb (145.151 kg)    Ideal Body Weight:     Wt Readings from Last 10 Encounters:  01/05/15 320 lb (145.151 kg)    BMI:  Body mass index is 47.23 kg/(m^2).  Diet Order:  Diet - low sodium heart healthy Diet Carb Modified Diet Carb Modified Fluid consistency:: Thin; Room service appropriate?: Yes  LOW Care Level  Romelle Starcherate Rhylei Mcquaig MS, RD, LDN [phone] Pager

## 2015-01-08 NOTE — Plan of Care (Signed)
Problem: Consults Goal: Tobacco Cessation referral if indicated Outcome: Not Applicable Date Met:  25/05/39 Pt non-smoker

## 2015-01-08 NOTE — Progress Notes (Signed)
Inpatient Diabetes Program Recommendations  AACE/ADA: New Consensus Statement on Inpatient Glycemic Control (2013)  Target Ranges:  Prepandial:   less than 140 mg/dL      Peak postprandial:   less than 180 mg/dL (1-2 hours)      Critically ill patients:  140 - 180 mg/dL   Reason for Visit: Consult for New-Onset DM This coordinator met with patient to discuss basic survival skills for DM management.  Pt is thinking about his "procedure" on his heart and it appears he is feeling somewhat overwhelmed.  He was open to the DM information and asked appropriate questions.  Living Well with Diabetes patient ed workbook was at bedside.  We reviewed the basics: what an A1C means, foot care, healthy eating, and exercise.  Pt has plans to increase his exercise routine (he walks almost everyday), and make healthy changes to his diet.  He is aware that he will need a glucose meter, lancets and strips RX at discharge. No further questions/concerns at the end of our visit.  Thank you  Raoul Pitch BSN, RN,CDE Inpatient Diabetes Coordinator [phone] (team pager)

## 2015-01-08 NOTE — Plan of Care (Signed)
Problem: Consults Goal: Cardiac Cath Patient Education (See Patient Education module for education specifics.)  Outcome: Completed/Met Date Met:  01/08/15 Pt educated on cardiac anatomy and disease process, cath procedure with possibility of stents

## 2015-01-08 NOTE — Progress Notes (Signed)
Patient: Craig Harvey / Admit Date: 01/05/2015 / Date of Encounter: 01/08/2015, 9:36 AM   Subjective: No further chest pain since his admission. He underwent treadmill nuclear stress test on 7/5 that needed to be 2 day study given body habitus. He exercised for 4+ minutes without any reproducibility of his symptoms, though did have a hypertensive response upon receiving target heart rate, thus the study was stopped. Stress images were abnormal showing distal anterior wall and apical perfusion defect. He will undergo resting images today for comparison. If final study is abnormal he will need cardiac catheterization.   Review of Systems: Review of Systems  Constitutional: Negative for fever, chills, weight loss, malaise/fatigue and diaphoresis.  HENT: Negative for congestion.   Eyes: Negative for discharge and redness.  Respiratory: Negative for cough, hemoptysis, sputum production, shortness of breath and wheezing.   Cardiovascular: Negative for chest pain, palpitations, orthopnea, claudication, leg swelling and PND.  Gastrointestinal: Negative for heartburn, nausea and vomiting.  Musculoskeletal: Negative for myalgias and falls.  Skin: Negative for rash.  Neurological: Negative for sensory change, speech change, weakness and headaches.  Endo/Heme/Allergies: Does not bruise/bleed easily.  Psychiatric/Behavioral: Negative for substance abuse. The patient is not nervous/anxious.   All other systems reviewed and are negative.   Objective: Telemetry: NSR 60's to 70's Physical Exam: Blood pressure 132/84, pulse 67, temperature 97.9 F (36.6 C), temperature source Oral, resp. rate 16, height  (1.753 m), weight 320 lb (145.151 kg), SpO2 97 %. Body mass index is 47.23 kg/(m^2). General: Well developed, well nourished, in no acute distress. Head: Normocephalic, atraumatic, sclera non-icteric, no xanthomas, nares are without discharge. Neck: Negative for carotid bruits. JVP not  elevated. Lungs: Clear bilaterally to auscultation without wheezes, rales, or rhonchi. Breathing is unlabored. Heart: RRR S1 S2 without murmurs, rubs, or gallops.  Abdomen: Obese, soft, non-tender, non-distended with normoactive bowel sounds. No rebound/guarding. Extremities: No clubbing or cyanosis. No edema. Distal pedal pulses are 2+ and equal bilaterally. Neuro: Alert and oriented X 3. Moves all extremities spontaneously. Psych:  Responds to questions appropriately with a normal affect.   Intake/Output Summary (Last 24 hours) at 01/08/15 0936 Last data filed at 01/08/15 0918  Gross per 24 hour  Intake    480 ml  Output   2575 ml  Net  -2095 ml    Inpatient Medications:  . aspirin EC  81 mg Oral Daily  . heparin  5,000 Units Subcutaneous 3 times per day  . insulin aspart  0-15 Units Subcutaneous TID WC  . insulin aspart  0-5 Units Subcutaneous QHS  . metoprolol succinate  25 mg Oral Daily  . sodium chloride  3 mL Intravenous Q12H   Infusions:    Labs:  Recent Labs  01/05/15 2206  NA 132*  K 3.7  CL 97*  CO2 25  GLUCOSE 240*  BUN 11  CREATININE 0.92  CALCIUM 8.9    Recent Labs  01/05/15 2206  AST 86*  ALT 102*  ALKPHOS 71  BILITOT 0.5  PROT 7.7  ALBUMIN 4.1    Recent Labs  01/05/15 2206  WBC 5.6  NEUTROABS 3.3  HGB 14.4  HCT 44.1  MCV 88.0  PLT 135*    Recent Labs  01/05/15 2206 01/06/15 0144 01/06/15 0703  TROPONINI <0.03 <0.03 <0.03   Invalid input(s): POCBNP  Recent Labs  01/06/15 0703  HGBA1C 10.1*     Weights: Filed Weights   01/05/15 2201  Weight: 320 lb (145.151 kg)  Radiology/Studies:  Dg Chest Portable 1 View  01/05/2015   CLINICAL DATA:  Intermittent chest pain for 3 days. History of hypertension.  EXAM: PORTABLE CHEST - 1 VIEW  COMPARISON:  None.  FINDINGS: The heart size and mediastinal contours are within normal limits. Both lungs are clear. The visualized skeletal structures are unremarkable.  IMPRESSION: Normal  portable chest radiograph.   Electronically Signed   By: Awilda Metroourtnay  Bloomer M.D.   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 - though he was able to perform on the treadmill without any reproducibility) Stress images abnormal showing mid to distal anterior wall , apical perfusion defect, await resting images for comparison - if abnormal he will need cardiac cath given abnormal nuc and risk factors  family hx of CAD, ABN EKG (inferior area),   2) Nausea: Resolved No AC in his house, mom does not turn it on, house is hot Felt like he was going to pass out (main reason he came in to hospital) He and his other brother live with mom  3) HTN: Hypertensive response to exercise without reproducibility of symptoms He has no PMD No primary care Not on meds to the notes On metoprolol here, Could add amlodipine 10 mg daily  4) OSA: Does not use CPAP. Often wakes up feeling SOB Recommended compliance  5) Morbid obesity: Likely contributing to most of his medical issues: OSA, HTN, diabetes  6) Diabetes: Poorly controlled HGA1C 10.1 Per IM Held metformin for possible cardiac cath  SignedEula Listen, Ayelen Sciortino, PA-C Pager: [phone] 01/08/2015, 9:36 AM

## 2015-01-09 ENCOUNTER — Encounter
Admission: EM | Disposition: A | Discharge status: Other Acute Inpatient Hospital | Payer: Self-pay | Source: Home / Self Care | Attending: Emergency Medicine

## 2015-01-09 ENCOUNTER — Encounter (HOSPITAL_COMMUNITY): Payer: Self-pay | Admitting: Family Medicine

## 2015-01-09 ENCOUNTER — Encounter: Payer: Self-pay | Admitting: Physician Assistant

## 2015-01-09 ENCOUNTER — Inpatient Hospital Stay (HOSPITAL_COMMUNITY)
Admission: AD | Admit: 2015-01-09 | Discharge: 2015-01-23 | DRG: 236 | Disposition: A | Discharge status: Home / Self Care | Payer: Medicaid Other | Source: Other Acute Inpatient Hospital | Attending: Cardiothoracic Surgery | Admitting: Cardiothoracic Surgery

## 2015-01-09 DIAGNOSIS — I4891 Unspecified atrial fibrillation: Secondary | ICD-10-CM | POA: Diagnosis not present

## 2015-01-09 DIAGNOSIS — I2510 Atherosclerotic heart disease of native coronary artery without angina pectoris: Secondary | ICD-10-CM

## 2015-01-09 DIAGNOSIS — E1151 Type 2 diabetes mellitus with diabetic peripheral angiopathy without gangrene: Secondary | ICD-10-CM

## 2015-01-09 DIAGNOSIS — I10 Essential (primary) hypertension: Secondary | ICD-10-CM | POA: Diagnosis present

## 2015-01-09 DIAGNOSIS — I25110 Atherosclerotic heart disease of native coronary artery with unstable angina pectoris: Secondary | ICD-10-CM

## 2015-01-09 DIAGNOSIS — Z01818 Encounter for other preprocedural examination: Secondary | ICD-10-CM

## 2015-01-09 DIAGNOSIS — Z951 Presence of aortocoronary bypass graft: Secondary | ICD-10-CM

## 2015-01-09 DIAGNOSIS — R0789 Other chest pain: Secondary | ICD-10-CM | POA: Diagnosis present

## 2015-01-09 DIAGNOSIS — Z6841 Body Mass Index (BMI) 40.0 and over, adult: Secondary | ICD-10-CM

## 2015-01-09 DIAGNOSIS — J9811 Atelectasis: Secondary | ICD-10-CM | POA: Diagnosis not present

## 2015-01-09 DIAGNOSIS — E785 Hyperlipidemia, unspecified: Secondary | ICD-10-CM | POA: Diagnosis present

## 2015-01-09 DIAGNOSIS — Z9689 Presence of other specified functional implants: Secondary | ICD-10-CM

## 2015-01-09 DIAGNOSIS — E119 Type 2 diabetes mellitus without complications: Secondary | ICD-10-CM | POA: Diagnosis present

## 2015-01-09 DIAGNOSIS — E8770 Fluid overload, unspecified: Secondary | ICD-10-CM | POA: Diagnosis not present

## 2015-01-09 DIAGNOSIS — G4733 Obstructive sleep apnea (adult) (pediatric): Secondary | ICD-10-CM | POA: Diagnosis present

## 2015-01-09 DIAGNOSIS — R7989 Other specified abnormal findings of blood chemistry: Secondary | ICD-10-CM | POA: Diagnosis present

## 2015-01-09 DIAGNOSIS — D62 Acute posthemorrhagic anemia: Secondary | ICD-10-CM | POA: Diagnosis not present

## 2015-01-09 DIAGNOSIS — Z8249 Family history of ischemic heart disease and other diseases of the circulatory system: Secondary | ICD-10-CM | POA: Diagnosis not present

## 2015-01-09 DIAGNOSIS — Z23 Encounter for immunization: Secondary | ICD-10-CM

## 2015-01-09 HISTORY — PX: CARDIAC CATHETERIZATION: SHX172

## 2015-01-09 LAB — CBC WITH DIFFERENTIAL/PLATELET
BASOS ABS: 0 10*3/uL (ref 0.0–0.1)
BASOS PCT: 1 % (ref 0–1)
Eosinophils Absolute: 0.1 10*3/uL (ref 0.0–0.7)
Eosinophils Relative: 1 % (ref 0–5)
HCT: 42.2 % (ref 39.0–52.0)
Hemoglobin: 14.6 g/dL (ref 13.0–17.0)
Lymphocytes Relative: 35 % (ref 12–46)
Lymphs Abs: 1.9 10*3/uL (ref 0.7–4.0)
MCH: 29 pg (ref 26.0–34.0)
MCHC: 34.6 g/dL (ref 30.0–36.0)
MCV: 83.9 fL (ref 78.0–100.0)
MONOS PCT: 10 % (ref 3–12)
Monocytes Absolute: 0.5 10*3/uL (ref 0.1–1.0)
NEUTROS PCT: 53 % (ref 43–77)
Neutro Abs: 2.9 10*3/uL (ref 1.7–7.7)
Platelets: 155 10*3/uL (ref 150–400)
RBC: 5.03 MIL/uL (ref 4.22–5.81)
RDW: 13.8 % (ref 11.5–15.5)
WBC: 5.3 10*3/uL (ref 4.0–10.5)

## 2015-01-09 LAB — LIPID PANEL
CHOL/HDL RATIO: 5.8 ratio
Cholesterol: 269 mg/dL — ABNORMAL HIGH (ref 0–200)
HDL: 46 mg/dL (ref >40)
LDL Cholesterol: 193 mg/dL — ABNORMAL HIGH (ref 0–99)
Triglycerides: 148 mg/dL (ref <150)
VLDL: 30 mg/dL (ref 0–40)

## 2015-01-09 LAB — MAGNESIUM: Magnesium: 1.9 mg/dL (ref 1.7–2.4)

## 2015-01-09 LAB — COMPREHENSIVE METABOLIC PANEL
ALBUMIN: 3.3 g/dL — AB (ref 3.5–5.0)
ALT: 84 U/L — ABNORMAL HIGH (ref 17–63)
ANION GAP: 9 (ref 5–15)
AST: 53 U/L — ABNORMAL HIGH (ref 15–41)
Alkaline Phosphatase: 60 U/L (ref 38–126)
BUN: 9 mg/dL (ref 6–20)
CHLORIDE: 98 mmol/L — AB (ref 101–111)
CO2: 24 mmol/L (ref 22–32)
CREATININE: 0.8 mg/dL (ref 0.61–1.24)
Calcium: 9.3 mg/dL (ref 8.9–10.3)
GFR calc Af Amer: >60 mL/min (ref >60)
GFR calc non Af Amer: >60 mL/min (ref >60)
Glucose, Bld: 201 mg/dL — ABNORMAL HIGH (ref 65–99)
POTASSIUM: 3.9 mmol/L (ref 3.5–5.1)
SODIUM: 131 mmol/L — AB (ref 135–145)
Total Bilirubin: 0.6 mg/dL (ref 0.3–1.2)
Total Protein: 7 g/dL (ref 6.5–8.1)

## 2015-01-09 LAB — GLUCOSE, CAPILLARY
GLUCOSE-CAPILLARY: 153 mg/dL — AB (ref 65–99)
GLUCOSE-CAPILLARY: 180 mg/dL — AB (ref 65–99)
GLUCOSE-CAPILLARY: 264 mg/dL — AB (ref 65–99)
Glucose-Capillary: 225 mg/dL — ABNORMAL HIGH (ref 65–99)

## 2015-01-09 LAB — MRSA PCR SCREENING: MRSA BY PCR: NEGATIVE

## 2015-01-09 LAB — BRAIN NATRIURETIC PEPTIDE: B Natriuretic Peptide: 13.2 pg/mL (ref 0.0–100.0)

## 2015-01-09 LAB — TSH: TSH: 0.741 u[IU]/mL (ref 0.350–4.500)

## 2015-01-09 LAB — PLATELET COUNT: Platelets: 156 10*3/uL (ref 150–440)

## 2015-01-09 SURGERY — LEFT HEART CATH AND CORONARY ANGIOGRAPHY
Anesthesia: Moderate Sedation

## 2015-01-09 MED ORDER — ASPIRIN EC 81 MG PO TBEC
81.0000 mg | DELAYED_RELEASE_TABLET | Freq: Every day | ORAL | Status: DC
  Administered 2015-01-10 – 2015-01-13 (×4): 81 mg via ORAL
  Filled 2015-01-09 (×6): qty 1

## 2015-01-09 MED ORDER — HEPARIN (PORCINE) IN NACL 100-0.45 UNIT/ML-% IJ SOLN
1650.0000 [IU]/h | INTRAMUSCULAR | Status: DC
  Administered 2015-01-09: 1800 [IU]/h via INTRAVENOUS
  Administered 2015-01-10: 1650 [IU]/h via INTRAVENOUS
  Administered 2015-01-10: 1800 [IU]/h via INTRAVENOUS
  Administered 2015-01-11 – 2015-01-13 (×4): 1650 [IU]/h via INTRAVENOUS
  Filled 2015-01-09 (×15): qty 250

## 2015-01-09 MED ORDER — IOHEXOL 300 MG/ML  SOLN
INTRAMUSCULAR | Status: DC | PRN
  Administered 2015-01-09: 100 mL via INTRA_ARTERIAL
  Administered 2015-01-09: 40 mL via INTRA_ARTERIAL

## 2015-01-09 MED ORDER — MIDAZOLAM HCL 2 MG/2ML IJ SOLN
INTRAMUSCULAR | Status: AC
  Filled 2015-01-09: qty 2

## 2015-01-09 MED ORDER — HEPARIN (PORCINE) IN NACL 2-0.9 UNIT/ML-% IJ SOLN
INTRAMUSCULAR | Status: AC
  Filled 2015-01-09: qty 1000

## 2015-01-09 MED ORDER — ASPIRIN 81 MG PO CHEW
CHEWABLE_TABLET | ORAL | Status: AC
  Administered 2015-01-09: 81 mg
  Filled 2015-01-09: qty 1

## 2015-01-09 MED ORDER — ONDANSETRON HCL 4 MG/2ML IJ SOLN
4.0000 mg | Freq: Four times a day (QID) | INTRAMUSCULAR | Status: DC | PRN
  Administered 2015-01-12: 4 mg via INTRAVENOUS
  Filled 2015-01-09: qty 2

## 2015-01-09 MED ORDER — SODIUM CHLORIDE 0.9 % WEIGHT BASED INFUSION: 3.0000 mL/kg/h | INTRAVENOUS | Status: DC

## 2015-01-09 MED ORDER — LABETALOL HCL 5 MG/ML IV SOLN
INTRAVENOUS | Status: DC | PRN
  Administered 2015-01-09 (×2): 10 mg via INTRAVENOUS

## 2015-01-09 MED ORDER — FENTANYL CITRATE (PF) 100 MCG/2ML IJ SOLN
INTRAMUSCULAR | Status: DC | PRN
  Administered 2015-01-09 (×2): 50 ug via INTRAVENOUS

## 2015-01-09 MED ORDER — ACETAMINOPHEN 325 MG PO TABS: 650.0000 mg | ORAL_TABLET | ORAL | Status: DC | PRN

## 2015-01-09 MED ORDER — MIDAZOLAM HCL 2 MG/2ML IJ SOLN
INTRAMUSCULAR | Status: DC | PRN
  Administered 2015-01-09: 1 mg via INTRAVENOUS
  Administered 2015-01-09: 0.5 mg via INTRAVENOUS

## 2015-01-09 MED ORDER — AMLODIPINE BESYLATE 5 MG PO TABS
5.0000 mg | ORAL_TABLET | Freq: Every day | ORAL | Status: DC
  Administered 2015-01-09: 5 mg via ORAL
  Filled 2015-01-09 (×2): qty 1

## 2015-01-09 MED ORDER — NITROGLYCERIN 0.4 MG SL SUBL
0.4000 mg | SUBLINGUAL_TABLET | SUBLINGUAL | Status: DC | PRN
  Administered 2015-01-11 – 2015-01-12 (×2): 0.4 mg via SUBLINGUAL
  Filled 2015-01-09 (×2): qty 1

## 2015-01-09 MED ORDER — ONDANSETRON HCL 4 MG/2ML IJ SOLN: 4.0000 mg | Freq: Four times a day (QID) | INTRAMUSCULAR | Status: DC | PRN

## 2015-01-09 MED ORDER — FENTANYL CITRATE (PF) 100 MCG/2ML IJ SOLN
INTRAMUSCULAR | Status: AC
  Filled 2015-01-09: qty 2

## 2015-01-09 MED ORDER — ROSUVASTATIN CALCIUM 20 MG PO TABS: 40.0000 mg | ORAL_TABLET | Freq: Every day | ORAL | Status: DC

## 2015-01-09 MED ORDER — PNEUMOCOCCAL VAC POLYVALENT 25 MCG/0.5ML IJ INJ
0.5000 mL | INJECTION | INTRAMUSCULAR | Status: AC
  Administered 2015-01-12: 0.5 mL via INTRAMUSCULAR
  Filled 2015-01-09: qty 0.5

## 2015-01-09 MED ORDER — INSULIN ASPART 100 UNIT/ML ~~LOC~~ SOLN
0.0000 [IU] | Freq: Three times a day (TID) | SUBCUTANEOUS | Status: DC
  Administered 2015-01-10 (×2): 3 [IU] via SUBCUTANEOUS

## 2015-01-09 MED ORDER — ASPIRIN 81 MG PO CHEW: 81.0000 mg | CHEWABLE_TABLET | Freq: Once | ORAL | Status: DC

## 2015-01-09 MED ORDER — METOPROLOL SUCCINATE ER 25 MG PO TB24
25.0000 mg | ORAL_TABLET | Freq: Every day | ORAL | Status: DC
  Administered 2015-01-09 – 2015-01-13 (×5): 25 mg via ORAL
  Filled 2015-01-09 (×6): qty 1

## 2015-01-09 MED ORDER — LABETALOL HCL 5 MG/ML IV SOLN
INTRAVENOUS | Status: AC
  Filled 2015-01-09: qty 4

## 2015-01-09 SURGICAL SUPPLY — 10 items
CATH INFINITI 5FR ANG PIGTAIL (CATHETERS) ×3 IMPLANT
CATH INFINITI 5FR JL4 (CATHETERS) ×3 IMPLANT
CATH INFINITI JR4 5F (CATHETERS) ×3 IMPLANT
DEVICE CLOSURE MYNXGRIP 5F (Vascular Products) ×3 IMPLANT
KIT MANI 3VAL PERCEP (MISCELLANEOUS) ×3 IMPLANT
NEEDLE PERC 18GX7CM (NEEDLE) ×3 IMPLANT
NEEDLE SMART 18G ACCESS (NEEDLE) IMPLANT
PACK CARDIAC CATH (CUSTOM PROCEDURE TRAY) ×3 IMPLANT
SHEATH AVANTI 5FR X 11CM (SHEATH) ×3 IMPLANT
WIRE EMERALD 3MM-J .035X150CM (WIRE) ×3 IMPLANT

## 2015-01-09 NOTE — Progress Notes (Signed)
ANTICOAGULATION CONSULT NOTE - Initial Consult  Pharmacy Consult for Heparin  Indication: CP -- needs CABG  No Known Allergies  Patient Measurements: Height: 5\' 9"  (175.3 cm) Weight: (!) 317 lb 3.9 oz (143.9 kg) IBW/kg (Calculated) : 70.7 Heparin Dosing Weight: 107 kg  Vital Signs: Temp: 98.4 F (36.9 C) (07/07 1520) Temp Source: Oral (07/07 1520) BP: 129/71 mmHg (07/07 1700) Pulse Rate: 100 (07/07 1700)  Labs:  Recent Labs  01/09/15 0425  PLT 156    Estimated Creatinine Clearance: 126.8 mL/min (by C-G formula based on Cr of 0.92).   Medical History: Past Medical History  Diagnosis Date  . Hypertension   . History of echocardiogram     a. echo 01/2015: EF 60-65%, no RWMA, LA nl, nl PASP - nl study  . CAD (coronary artery disease)     a. treadmill myoview 01/2015: high risk study, lg defect of mod severity present along mid ant, mid anteroseptal, mid anterolateral, apical ant, apical inf, apical lat and apex, c/w ischemia & prior MI with peri-infarct ischemia, hypertensive response; b. cardiac cath 01/09/2015: ostLAD 70:, mLAD 99%, ost to pLCx 95%, mLCx 80%, LVEF nl, recommend CABG  . Morbid obesity   . DM (diabetes mellitus)     a. newly diagnosed 01/2015  . HLD (hyperlipidemia)   . OSA (obstructive sleep apnea)     a. does not use cpap    Assessment: 59 year old male transferred [facility].  Pharmacy asked to begin heparin.  Goal of Therapy:  Heparin level 0.3-0.7 units/ml Monitor platelets by anticoagulation protocol: Yes   Plan:  Heparin at 1800 units / hr Daily Heparin level, CBC  Thank you. Okey RegalLisa Diamonds L[place]ard, PharmD [phone]  01/09/2015,5:07 PM

## 2015-01-09 NOTE — Progress Notes (Signed)
Inpatient Diabetes Program Recommendations  AACE/ADA: New Consensus Statement on Inpatient Glycemic Control (2013)  Target Ranges:  Prepandial:   less than 140 mg/dL      Peak postprandial:   less than 180 mg/dL (1-2 hours)      Critically ill patients:  140 - 180 mg/dL   Please consider ordering outpatient diabetes education at the [facility] for diabetes and diet education.  Susette RacerJulie Carsyn Taubman, RN, BA, MHA, CDE Diabetes Coordinator Inpatient Diabetes Program  [phone] (Team Pager) [phone] [facility] Office) 01/09/2015 12:07 PM

## 2015-01-09 NOTE — Care Management (Signed)
Informed that arrangements have been made for patient [facility].  Completed transfer form

## 2015-01-09 NOTE — Progress Notes (Signed)
Patient transfer to [place]  [facility], report call to receiving nurse.

## 2015-01-09 NOTE — Progress Notes (Signed)
Interval Coverage Note:  Patient arrived from [place] [facility] for consideration of CABG. Cath note below:  Conclusion     Ost Cx to Prox Cx lesion, 95% stenosed.  Ost LAD lesion, 70% stenosed.  The left ventricular systolic function is normal.  Mid Cx lesion, 80% stenosed.  Mid LAD lesion, 99% stenosed.  Right dominant coronary arterial system Severe two-vessel disease: Ostial LAD estimated at 70-80%, eccentric calcification, LAD is a large vessel Also with critical proximal LAD lesion estimated at 95-99%. Proximal circumflex with severe 95% lesion after the takeoff of OM1, long, calcified, circumflex is a large vessel Mid circumflex also with 80% lesion, focal, prior to the takeoff of OM 2 Right coronary artery with mild diffuse disease  Case was discussed with Dr. Kirke CorinArida. Given his unstable angina, severity of the proximal LAD lesion, presence of a calcified ostial LAD stenosis, as well as multiple regions of stenosis requiring intervention, it was suggested he be evaluated for possible bypass surgery. This would likely provide him a better long-term solution to his diffuse 2 vessel CAD.  Given his unstable angina, degree of stenosis, he will be [facility].   Results were discussed with the patient       Discussed with Nancie Neasyan Dun PA-C at [facility] who has placed admission order.   Subjective: Comfortable, no chest pain or SOB at this time. Family at bedside.   Physical exam:  Heart: RRR, no murmur Lung: CTA anteriorly General: NAD, pleasant HEENT: normal Abdomen: Nontender, nondistended Extremity: no edema  Plan:  Workup underway by CT surgery. Continue Heparin  Craig DialSigned, Craig Clarkston PA Pager: [phone]

## 2015-01-09 NOTE — Progress Notes (Signed)
Cardiac catheterization Details severe ostial LAD, critical proximal LAD disease Also with severe proximal left circumflex disease Severe mid to distal left circumflex disease  Case discussed with Dr. Kirke CorinArida, Given his unstable angina, positive stress test, Disease in the ostial LAD, numerous lesions requiring intervention, One of which is a critical LAD lesion, Would recommend transfer to [facility] for [facility]G

## 2015-01-09 NOTE — Care Management (Signed)
Patient has positive stress test 7/6 and scheduled for cardiac cath today

## 2015-01-09 NOTE — OR Nursing (Signed)
md informed that received asa 81 mg twice, and Heparin 5000 units sq at [phone]

## 2015-01-09 NOTE — Progress Notes (Addendum)
Patient: Craig Harvey / Admit Date: 01/05/2015 / Date of Encounter: 01/09/2015, 7:47 AM   Subjective: No chest pain or SOB overnight or since his admission. He is for cardiac cath today in the setting of high risk nuclear stress test that showed large defect of moderate severity in the mid anterior, mid anteroseptal, apical anterior, apical inferior, apical lateral, and apex location. Findings are consistent with ischemia and prior myocardial infarction with peri-infarct ischemia.    Review of Systems: Review of Systems  Constitutional: Negative for fever, chills, weight loss, malaise/fatigue and diaphoresis.  HENT: Negative for congestion.   Eyes: Negative for blurred vision, discharge and redness.  Respiratory: Negative for cough, hemoptysis, sputum production, shortness of breath and wheezing.   Cardiovascular: Negative for chest pain, palpitations, orthopnea, claudication, leg swelling and PND.  Gastrointestinal: Negative for heartburn, nausea, vomiting, blood in stool and melena.  Genitourinary: Negative for hematuria.  Musculoskeletal: Negative for myalgias and falls.  Skin: Negative for rash.  Neurological: Negative for sensory change, speech change, weakness and headaches.  Endo/Heme/Allergies: Does not bruise/bleed easily.  Psychiatric/Behavioral: Negative for substance abuse. The patient is not nervous/anxious.   All other systems reviewed and are negative.   Objective: Telemetry: NSR, 60's to 70's, 1st degree heart block  Physical Exam: Blood pressure 107/65, pulse 74, temperature 98.1 F (36.7 C), temperature source Oral, resp. rate 19, height 5\' 9"  (1.753 m), weight 320 lb (145.151 kg), SpO2 96 %. Body mass index is 47.23 kg/(m^2). General: Well developed, well nourished, in no acute distress. Head: Normocephalic, atraumatic, sclera non-icteric, no xanthomas, nares are without discharge. Neck: Negative for carotid bruits. JVP not elevated. Lungs: Clear bilaterally to  auscultation without wheezes, rales, or rhonchi. Breathing is unlabored. Heart: RRR S1 S2 without murmurs, rubs, or gallops.  Abdomen: Obese, soft, non-tender, non-distended with normoactive bowel sounds. No rebound/guarding. Extremities: No clubbing or cyanosis. No edema. Distal pedal pulses are 2+ and equal bilaterally. Neuro: Alert and oriented X 3. Moves all extremities spontaneously. Psych:  Responds to questions appropriately with a normal affect.   Intake/Output Summary (Last 24 hours) at 01/09/15 0747 Last data filed at 01/09/15 0500  Gross per 24 hour  Intake    486 ml  Output   2925 ml  Net  -2439 ml    Inpatient Medications:  . aspirin EC  81 mg Oral Daily  . heparin  5,000 Units Subcutaneous 3 times per day  . insulin aspart  0-15 Units Subcutaneous TID WC  . insulin aspart  0-5 Units Subcutaneous QHS  . metoprolol succinate  25 mg Oral Daily  . rosuvastatin  40 mg Oral q1800  . sodium chloride  3 mL Intravenous Q12H  . sodium chloride  3 mL Intravenous Q12H   Infusions:  . sodium chloride 1 mL/kg/hr (01/09/15 0727)    Labs: No results for input(s): NA, K, CL, CO2, GLUCOSE, BUN, CREATININE, CALCIUM, MG, PHOS in the last 72 hours. No results for input(s): AST, ALT, ALKPHOS, BILITOT, PROT, ALBUMIN in the last 72 hours.  Recent Labs  01/09/15 0425  PLT 156   No results for input(s): CKTOTAL, CKMB, TROPONINI in the last 72 hours. Invalid input(s): POCBNP No results for input(s): HGBA1C in the last 72 hours.   Weights: Filed Weights   01/05/15 2201  Weight: 320 lb (145.151 kg)     Radiology/Studies:  Nm Myocar Multi W/spect W/wall Motion / Ef  01/08/2015    This is a HIGH RISK study.  Defect  1: There is a large defect of moderate severity present in the  mid anterior, mid anteroseptal, mid anterolateral, apical anterior, apical  inferior, apical lateral and apex location.  Findings consistent with ischemia and prior myocardial infarction with  peri-infarct  ischemia.  Upsloping ST segment depression ST segment depression was noted during  stress in the inferolateral leads (II, III, aVF, V5 and V6), and returning  to baseline after 5-9 minutes of recovery.  Blood pressure demonstrated a hypertensive response to exercise with  Poor Exercise tolerance.  The left ventricular ejection fraction is low normal - estimated EF ~53%  (45-54%).   HIGH RISK Test due to Poor Exercise Tolerance, Large-partially reversible  perfusion defect in the likely LAD distribution.  Would consider Cardiac Catheterization.   Dg Chest Portable 1 View  01/05/2015   CLINICAL DATA:  Intermittent chest pain for 3 days. History of hypertension.  EXAM: PORTABLE CHEST - 1 VIEW  COMPARISON:  None.  FINDINGS: The heart size and mediastinal contours are within normal limits. Both lungs are clear. The visualized skeletal structures are unremarkable.  IMPRESSION: Normal portable chest radiograph.   Electronically Signed   By: Awilda Metro M.D.   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 Findings are consistent with ischemia and prior myocardial infarction with peri-infarct ischemia. There was upsloping ST segment depression noted during stress in the inferolateral leads, returning to baseline after 5-9 minutes of recovery. Blood pressure demonstrated a hypertensive response with exercise with poor exercise tolerance. LVEF estimated at 54% (45-54%)  -He is for  cardiac cath today -Risks and benefits of cardiac catheterization have been discussed with the patient including risks of bleeding, bruising, infection, kidney damage, stroke, heart attack, and death. The patient understands these risks and is willing to proceed with the procedure. All questions have been answered and concerns listened to.   2) Nausea: -Resolved -No AC in his house, mom does not turn it on, house is hot -Felt like he was going to pass out (main reason he came in to hospital) -He and his other brother live with mom  3) HTN: -Hypertensive response to exercise without reproducibility of symptoms -He has no PMD -No primary care -Not on meds to the notes -On metoprolol here, -Could add amlodipine 10 mg daily  4) OSA: -Does not use CPAP. -Often wakes up feeling SOB -Recommended compliance  5) Morbid obesity: -Likely contributing to most of his medical issues: -OSA, HTN, diabetes  6) Newly diagnosed diabetes: -Poorly controlled -HGA1C 10.1 -Per IM -Held metformin for possible cardiac cath -Diabetic education consult placed  7) HLD: -Poorly controlled -LDL 193 -Changed Lipitor to Crestor 40 mg daily   Signed, Eula Listen, PA-C Pager: [phone] 01/09/2015, 7:47 AM  Attending Note Patient seen and examined, agree with detailed note above,  Patient presentation and plan discussed on rounds.   For cardiac catheterization today Dr. Herbie Baltimore red stress test yesterday showing  Ischemia,  Also with underlying new diabetes, poorly controlled, hyperlipidemia High  risk of CAD  Signed: Dossie Arbourim Gollan  M.D., Ph.D.

## 2015-01-09 NOTE — Discharge Summary (Signed)
[facility] - [place] at [facility]   PATIENT NAME: Craig Harvey    MR#:  [id number]  DATE OF BIRTH:  [birth year]  DATE OF ADMISSION:  01/05/2015 ADMITTING PHYSICIAN: Wyatt Haste, MD  DATE OF DISCHARGE/  Transfer 01/09/2015 PRIMARY CARE PHYSICIAN: No primary care provider on file.    ADMISSION DIAGNOSIS:  Crescendo angina [I20.0]  DISCHARGE DIAGNOSIS:  Principal Problem:  Unstable angina with abnormal cardiac catheterization needing coronary artery bypass grafting  Active Problems:   Chest pain on exertion   Type 2 diabetes mellitus without complication   OSA (obstructive sleep apnea)   Morbid obesity   Abnormal EKG   SECONDARY DIAGNOSIS:   Past Medical History  Diagnosis Date  . Hypertension   . History of echocardiogram     a. echo 01/2015: EF 60-65%, no RWMA, LA nl, nl PASP - nl study  . CAD (coronary artery disease)     a. treadmill myoview 01/2015: high risk study, lg defect of mod severity present along mid ant, mid anteroseptal, mid anterolateral, apical ant, apical inf, apical lat and apex, c/w ischemia & prior MI with peri-infarct ischemia, hypertensive response; b. cardiac cath 01/09/2015: ostLAD 70:, mLAD 99%, ost to pLCx 95%, mLCx 80%, LVEF nl, recommend CABG  . Morbid obesity   . DM (diabetes mellitus)     a. newly diagnosed 01/2015  . HLD (hyperlipidemia)   . OSA (obstructive sleep apnea)     a. does not use cpap    HOSPITAL COURSE:  Unstable angina  Nuclear stress test is high risk study with a large defect of moderate severity in the mid anterior, mid anteroseptal, apical anterior, apical inferior, apical lateral, and apex location. Findings are consistent with ischemia and prior myocardial infarction with peri-infarct ischemia. There was upsloping ST segment depression noted during stress in the inferolateral leads, returning to baseline after 5-9 minutes of recovery. Blood pressure demonstrated a hypertensive response with exercise  with poor exercise tolerance. LVEF estimated at 54%  Abnormal cardiac cathete today, cardiology has recommended three-vessel coronary artery bypass grafting at [facility]. Patient will be transferred to [facility] soon after bed is available Continue current management  * HTN, Uncontrolled On Toprol. Added norvasc and titrate as needed,  * New onset DM Started metformin. Now on hold for cath. HbA1c - 10  * Sleep apnea- outpatient follow-up with pulmonology  * Morbid obesity- life still changes once patient is clinically stable     DISCHARGE CONDITIONS:   transfer to [facility] for coronary artery bypass grafting  CONSULTS OBTAINED:  Treatment Team:  Wyatt Haste, MD Antonieta Iba, MD Ramonita Lab, MD   PROCEDURES cardiac catheterization-01/09/2015  DRUG ALLERGIES:  No Known Allergies  DISCHARGE MEDICATIONS:   Current Discharge Medication List    START taking these medications   Details  amLODipine (NORVASC) 5 MG tablet Take 1 tablet (5 mg total) by mouth daily. Qty: 30 tablet, Refills: 0    aspirin EC 81 MG EC tablet Take 1 tablet (81 mg total) by mouth daily.    metFORMIN (GLUCOPHAGE) 1000 MG tablet Take 1 tablet (1,000 mg total) by mouth 2 (two) times daily with a meal. Qty: 60 tablet, Refills: 0    metoprolol succinate (TOPROL-XL) 25 MG 24 hr tablet Take 1 tablet (25 mg total) by mouth daily. Qty: 30 tablet, Refills: 0         DISCHARGE INSTRUCTIONS:    transfer to [facility]  for coronary artery bypass grafting   DIET:  Cardiac and ADA   DISCHARGE CONDITION:  Satisfactory   ACTIVITY:  Bedrest   OXYGEN:   2 L via nasal cannula  DISCHARGE LOCATION:   [facility] artery [facility]   If you experience worsening of your admission symptoms, develop shortness of breath, life threatening emergency, suicidal or homicidal thoughts you must seek medical attention immediately  by calling 911 or calling your MD immediately  if symptoms less severe.  You Must read complete instructions/literature along with all the possible adverse reactions/side effects for all the Medicines you take and that have been prescribed to you. Take any new Medicines after you have completely understood and accpet all the possible adverse reactions/side effects.   Please note  You were cared for by a hospitalist during your hospital stay. If you have any questions about your discharge medications or the care you received while you were in the hospital after you are discharged, you can call the unit and asked to speak with the hospitalist on call if the hospitalist that took care of you is not available. Once you are discharged, your primary care physician will handle any further medical issues. Please note that NO REFILLS for any discharge medications will be authorized once you are discharged, as it is imperative that you return to your primary care physician (or establish a relationship with a primary care physician if you do not have one) for your aftercare needs so that they can reassess your need for medications and monitor your lab values.     Today  Chief Complaint  Patient presents with  . Chest Pain     ROS:  CONSTITUTIONAL: Denies fevers, chills. Denies any fatigue, weakness.  EYES: Denies blurry vision, double vision, eye pain. EARS, NOSE, THROAT: Denies tinnitus, ear pain, hearing loss. RESPIRATORY: Denies cough, wheeze, shortness of breath.  CARDIOVASCULAR: Denies chest pain, palpitations, edema.  GASTROINTESTINAL: Denies nausea, vomiting, diarrhea, abdominal pain. Denies bright red blood per rectum. GENITOURINARY: Denies dysuria, hematuria. ENDOCRINE: Denies nocturia or thyroid problems. HEMATOLOGIC AND LYMPHATIC: Denies easy bruising or bleeding. SKIN: Denies rash or lesion. MUSCULOSKELETAL: Denies pain in neck, back, shoulder, knees, hips or arthritic symptoms.   NEUROLOGIC: Denies paralysis, paresthesias.  PSYCHIATRIC: Denies anxiety or depressive symptoms.   VITAL SIGNS:  Blood pressure 151/98, pulse 71, temperature 98.2 F (36.8 C), temperature source Oral, resp. rate 18, height 5\' 9"  (1.753 m), weight 145.151 kg (320 lb), SpO2 97 %.  I/O:   Intake/Output Summary (Last 24 hours) at 01/09/15 1338 Last data filed at 01/09/15 1332  Gross per 24 hour  Intake    246 ml  Output   2850 ml  Net  -2604 ml    PHYSICAL EXAMINATION:  GENERAL:  59 y.o.-year-old patient lying in the bed with no acute distress.  EYES: Pupils equal, round, reactive to light and accommodation. No scleral icterus. Extraocular muscles intact.  HEENT: Head atraumatic, normocephalic. Oropharynx and nasopharynx clear.  NECK:  Supple, no jugular venous distention. No thyroid enlargement, no tenderness.  LUNGS: Normal breath sounds bilaterally, no wheezing, rales,rhonchi or crepitation. No use of accessory muscles of respiration.  CARDIOVASCULAR: S1, S2 normal. No murmurs, rubs, or gallops.  ABDOMEN: Soft, non-tender, non-distended. Bowel sounds present. No organomegaly or mass.  EXTREMITIES: No pedal edema, cyanosis, or clubbing.  NEUROLOGIC: Cranial nerves II through XII are intact. Muscle strength 5/5 in all extremities. Sensation intact. Gait not checked.  PSYCHIATRIC: The  patient is alert and oriented x 3.  SKIN: No obvious rash, lesion, or ulcer.   DATA REVIEW:   CBC  Recent Labs Lab 01/05/15 2206 01/09/15 0425  WBC 5.6  --   HGB 14.4  --   HCT 44.1  --   PLT 135* 156    Chemistries   Recent Labs Lab 01/05/15 2206  NA 132*  K 3.7  CL 97*  CO2 25  GLUCOSE 240*  BUN 11  CREATININE 0.92  CALCIUM 8.9  AST 86*  ALT 102*  ALKPHOS 71  BILITOT 0.5    Cardiac Enzymes  Recent Labs Lab 01/06/15 0703  TROPONINI <0.03    Microbiology Results  No results found for this or any previous visit.  RADIOLOGY:  Nm Myocar Multi W/spect W/wall Motion /  Ef  01/08/2015    This is a HIGH RISK study.  Defect 1: There is a large defect of moderate severity present in the  mid anterior, mid anteroseptal, mid anterolateral, apical anterior, apical  inferior, apical lateral and apex location.  Findings consistent with ischemia and prior myocardial infarction with  peri-infarct ischemia.  Upsloping ST segment depression ST segment depression was noted during  stress in the inferolateral leads (II, III, aVF, V5 and V6), and returning  to baseline after 5-9 minutes of recovery.  Blood pressure demonstrated a hypertensive response to exercise with  Poor Exercise tolerance.  The left ventricular ejection fraction is low normal - estimated EF ~53%  (45-54%).   HIGH RISK Test due to Poor Exercise Tolerance, Large-partially reversible  perfusion defect in the likely LAD distribution.  Would consider Cardiac Catheterization.   Dg Chest Portable 1 View  01/05/2015   CLINICAL DATA:  Intermittent chest pain for 3 days. History of hypertension.  EXAM: PORTABLE CHEST - 1 VIEW  COMPARISON:  None.  FINDINGS: The heart size and mediastinal contours are within normal limits. Both lungs are clear. The visualized skeletal structures are unremarkable.  IMPRESSION: Normal portable chest radiograph.   Electronically Signed   By: Awilda Metro M.D.   On: 01/05/2015 22:39    EKG:   Orders placed or performed during the hospital encounter of 01/05/15  . ED EKG  . ED EKG  . EKG 12-Lead  . EKG 12-Lead  . EKG      Management plans discussed with the patient, family and they are in agreement.  CODE STATUS:     Code Status Orders        Start     Ordered   01/09/15 1327  Full code   Continuous     01/09/15 1327      TOTAL TIME TAKING CARE OF THIS PATIENT: 45 minutes.    @[facility] @  on 01/09/2015 at 1:38 PM  Between 7am to 6pm - Pager - [phone]  After 6pm go to [email or web address] - password [email or web address] [facility]  [facility]  Office   [phone]  CC: Primary care physician; No primary care provider on file.

## 2015-01-09 NOTE — OR Nursing (Signed)
Ns infusing via left arm piv, right saline lock flushed without difficulty

## 2015-01-09 NOTE — Progress Notes (Signed)
Patient C/O chest soreness (8/10) when he pushes on his sternum.  EKG obtained and no obvious changes. PA notified.  Will continue to monitor. [place], Mitzi HansenJessica Marie

## 2015-01-10 ENCOUNTER — Other Ambulatory Visit: Payer: Self-pay | Admitting: *Deleted

## 2015-01-10 ENCOUNTER — Encounter: Payer: Self-pay | Admitting: Cardiovascular Disease

## 2015-01-10 ENCOUNTER — Inpatient Hospital Stay (HOSPITAL_COMMUNITY): Payer: Medicaid Other

## 2015-01-10 DIAGNOSIS — I200 Unstable angina: Secondary | ICD-10-CM

## 2015-01-10 DIAGNOSIS — I2510 Atherosclerotic heart disease of native coronary artery without angina pectoris: Secondary | ICD-10-CM

## 2015-01-10 DIAGNOSIS — E119 Type 2 diabetes mellitus without complications: Secondary | ICD-10-CM

## 2015-01-10 DIAGNOSIS — G4733 Obstructive sleep apnea (adult) (pediatric): Secondary | ICD-10-CM

## 2015-01-10 LAB — SPIROMETRY WITH GRAPH
FEF 25-75 Post: 3.2 L/sec
FEF 25-75 Pre: 2.96 L/sec
FEF2575-%Change-Post: 8 %
FEF2575-%Pred-Post: 108 %
FEF2575-%Pred-Pre: 99 %
FEV1-%Change-Post: 2 %
FEV1-%Pred-Post: 103 %
FEV1-%Pred-Pre: 100 %
FEV1-Post: 3.21 L
FEV1-Pre: 3.11 L
FEV1FVC-%Change-Post: 1 %
FEV1FVC-%Pred-Pre: 100 %
FEV6-%Change-Post: 2 %
FEV6-%Pred-Post: 104 %
FEV6-%Pred-Pre: 102 %
FEV6-Post: 3.97 L
FEV6-Pre: 3.88 L
FEV6FVC-%Change-Post: 0 %
FEV6FVC-%Pred-Post: 103 %
FEV6FVC-%Pred-Pre: 102 %
FVC-%Change-Post: 1 %
FVC-%Pred-Post: 101 %
FVC-%Pred-Pre: 99 %
FVC-Post: 3.97 L
FVC-Pre: 3.93 L
Post FEV1/FVC ratio: 81 %
Post FEV6/FVC ratio: 100 %
Pre FEV1/FVC ratio: 79 %
Pre FEV6/FVC Ratio: 99 %

## 2015-01-10 LAB — GLUCOSE, CAPILLARY
GLUCOSE-CAPILLARY: 177 mg/dL — AB (ref 65–99)
GLUCOSE-CAPILLARY: 165 mg/dL — AB (ref 65–99)
GLUCOSE-CAPILLARY: 220 mg/dL — AB (ref 65–99)
Glucose-Capillary: 192 mg/dL — ABNORMAL HIGH (ref 65–99)

## 2015-01-10 LAB — BASIC METABOLIC PANEL
ANION GAP: 9 (ref 5–15)
BUN: 7 mg/dL (ref 6–20)
CHLORIDE: 95 mmol/L — AB (ref 101–111)
CO2: 27 mmol/L (ref 22–32)
Calcium: 8.8 mg/dL — ABNORMAL LOW (ref 8.9–10.3)
Creatinine, Ser: 0.87 mg/dL (ref 0.61–1.24)
GFR calc Af Amer: >60 mL/min (ref >60)
Glucose, Bld: 172 mg/dL — ABNORMAL HIGH (ref 65–99)
POTASSIUM: 3.6 mmol/L (ref 3.5–5.1)
Sodium: 131 mmol/L — ABNORMAL LOW (ref 135–145)

## 2015-01-10 LAB — CBC
HCT: 41.9 % (ref 39.0–52.0)
Hemoglobin: 14.3 g/dL (ref 13.0–17.0)
MCH: 29.4 pg (ref 26.0–34.0)
MCHC: 34.1 g/dL (ref 30.0–36.0)
MCV: 86 fL (ref 78.0–100.0)
PLATELETS: 138 10*3/uL — AB (ref 150–400)
RBC: 4.87 MIL/uL (ref 4.22–5.81)
RDW: 14 % (ref 11.5–15.5)
WBC: 6.4 10*3/uL (ref 4.0–10.5)

## 2015-01-10 LAB — HEPARIN LEVEL (UNFRACTIONATED)
HEPARIN UNFRACTIONATED: 0.76 [IU]/mL — AB (ref 0.30–0.70)
Heparin Unfractionated: 0.56 IU/mL (ref 0.30–0.70)
Heparin Unfractionated: 0.66 IU/mL (ref 0.30–0.70)

## 2015-01-10 MED ORDER — ATORVASTATIN CALCIUM 80 MG PO TABS
80.0000 mg | ORAL_TABLET | Freq: Every day | ORAL | Status: DC
  Administered 2015-01-10 – 2015-01-22 (×12): 80 mg via ORAL
  Filled 2015-01-10 (×14): qty 1

## 2015-01-10 MED ORDER — INSULIN GLARGINE 100 UNIT/ML ~~LOC~~ SOLN
24.0000 [IU] | Freq: Every day | SUBCUTANEOUS | Status: DC
  Administered 2015-01-10 – 2015-01-13 (×4): 24 [IU] via SUBCUTANEOUS
  Filled 2015-01-10 (×5): qty 0.24

## 2015-01-10 MED ORDER — ALBUTEROL SULFATE (2.5 MG/3ML) 0.083% IN NEBU
2.5000 mg | INHALATION_SOLUTION | Freq: Once | RESPIRATORY_TRACT | Status: AC
  Administered 2015-01-10: 2.5 mg via RESPIRATORY_TRACT

## 2015-01-10 MED ORDER — INSULIN ASPART 100 UNIT/ML ~~LOC~~ SOLN
0.0000 [IU] | Freq: Three times a day (TID) | SUBCUTANEOUS | Status: DC
  Administered 2015-01-10: 5 [IU] via SUBCUTANEOUS
  Administered 2015-01-10: 3 [IU] via SUBCUTANEOUS
  Administered 2015-01-11: 5 [IU] via SUBCUTANEOUS
  Administered 2015-01-11 (×2): 2 [IU] via SUBCUTANEOUS
  Administered 2015-01-11 – 2015-01-12 (×4): 3 [IU] via SUBCUTANEOUS
  Administered 2015-01-13 (×2): 2 [IU] via SUBCUTANEOUS
  Administered 2015-01-13: 3 [IU] via SUBCUTANEOUS

## 2015-01-10 NOTE — Progress Notes (Signed)
CARDIAC REHAB PHASE I   PRE:  Rate/Rhythm: 72 SR  BP:  Supine:   Sitting: 151/84  Standing:    SaO2: 96%RA  MODE:  Ambulation: 350 ft   POST:  Rate/Rhythm: 88 SR  BP:  Supine:   Sitting: 131/81  Standing:    SaO2: 99%RA 1110-1145 Pt walked 350 ft with steady gait. No CP. Tolerated well. Discussed importance of walking and using IS after surgery. Discussed sternal precautions. Gave pt IS and able to get to 2500 ml. Stated has already seen pre op video. Gave [facility] booklet and care guide. Pt will have mother and brother available after discharge. Pt thought he was going to be here 6 to 7 weeks to recover. Discussed that we will take good care of him and make sure he is ready before sending home but usually about 6 days after surgery. To chair with call bell.   Luetta Nuttingharlene Nelda Luckey, RN BSN  01/10/2015 11:41 AM

## 2015-01-10 NOTE — Progress Notes (Addendum)
CPAP setup at bedside with nasal mask. 10cm H20 per home settings. NO O2. Humidity chamber filled. Pt resting. Pt understands to call with questions/concerns.

## 2015-01-10 NOTE — Progress Notes (Signed)
Pt request CPAP be setup at bedside in reach. He wants to put it on when he is ready. He uses at home and understands how to operate. CPAP is ready for use. Humidity chamber filled. No O2. Pt understands to call with questions/concerns.

## 2015-01-10 NOTE — Progress Notes (Signed)
DAILY PROGRESS NOTE  Subjective:  No chest pain overnight. BP soft this morning, but re-check was 119/64.  Will hold amlodipine. On IV heparin. Cath site looks intact - mynx closure device noted.  Objective:  Temp:  [97.8 F (36.6 C)-98.4 F (36.9 C)] 98.2 F (36.8 C) (07/08 0737) Pulse Rate:  [58-104] 73 (07/08 0800) Resp:  [8-28] 20 (07/08 0800) BP: (85-188)/(51-126) 101/71 mmHg (07/08 0800) SpO2:  [93 %-100 %] 98 % (07/08 0800) FiO2 (%):  [21 %] 21 % (07/08 0400) Weight:  [317 lb 3.9 oz (143.9 kg)] 317 lb 3.9 oz (143.9 kg) (07/07 1520) Weight change:   Intake/Output from previous day: 07/07 0701 - 07/08 0700 In: 465.9 [P.O.:240; I.V.:225.9] Out: -   Intake/Output from this shift: Total I/O In: 276 [P.O.:240; I.V.:36] Out: -   Medications: Current Facility-Administered Medications  Medication Dose Route Frequency Provider Last Rate Last Dose  . acetaminophen (TYLENOL) tablet 650 mg  650 mg Oral Q4H PRN Areta Haber Dunn, PA-C      . amLODipine (NORVASC) tablet 5 mg  5 mg Oral Daily Areta Haber Dunn, PA-C   5 mg at 01/09/15 1804  . aspirin EC tablet 81 mg  81 mg Oral Daily Ryan M Dunn, PA-C   81 mg at 01/10/15 9169  . heparin ADULT infusion 100 units/mL (25000 units/250 mL)  1,800 Units/hr Intravenous Continuous Jerline Pain, MD 18 mL/hr at 01/10/15 0631 1,800 Units/hr at 01/10/15 0631  . insulin aspart (novoLOG) injection 0-15 Units  0-15 Units Subcutaneous TID WC Brett Canales, PA-C   3 Units at 01/10/15 [phone]  . metoprolol succinate (TOPROL-XL) 24 hr tablet 25 mg  25 mg Oral Daily Ryan M Dunn, PA-C   25 mg at 01/10/15 8882  . nitroGLYCERIN (NITROSTAT) SL tablet 0.4 mg  0.4 mg Sublingual Q5 Min x 3 PRN Ryan M Dunn, PA-C      . ondansetron [facility]) injection 4 mg  4 mg Intravenous Q6H PRN Areta Haber Dunn, PA-C      . pneumococcal 23 valent vaccine (PNU-IMMUNE) injection 0.5 mL  0.5 mL Intramuscular Tomorrow-1000 Jerline Pain, MD        Physical Exam: General appearance: alert  and no distress Lungs: clear to auscultation bilaterally Heart: regular rate and rhythm, S1, S2 normal, no murmur, click, rub or gallop Extremities: extremities normal, atraumatic, no cyanosis or edema and cath site without hematoma, ecchymosis or bruit Pulses: 2+ and symmetric Neurologic: Grossly normal  Lab Results: Results for orders placed or performed during the hospital encounter of 01/09/15 (from the past 48 hour(s))  MRSA PCR Screening     Status: None   Collection Time: 01/09/15  3:25 PM  Result Value Ref Range   MRSA by PCR NEGATIVE NEGATIVE    Comment:        The GeneXpert MRSA Assay (FDA approved for NASAL specimens only), is one component of a comprehensive MRSA colonization surveillance program. It is not intended to diagnose MRSA infection nor to guide or monitor treatment for MRSA infections.   Glucose, capillary     Status: Abnormal   Collection Time: 01/09/15  3:54 PM  Result Value Ref Range   Glucose-Capillary 264 (H) 65 - 99 mg/dL  Comprehensive metabolic panel     Status: Abnormal   Collection Time: 01/09/15  6:25 PM  Result Value Ref Range   Sodium 131 (L) 135 - 145 mmol/L   Potassium 3.9 3.5 - 5.1 mmol/L   Chloride 98 (  L) 101 - 111 mmol/L   CO2 24 22 - 32 mmol/L   Glucose, Bld 201 (H) 65 - 99 mg/dL   BUN 9 6 - 20 mg/dL   Creatinine, Ser 0.80 0.61 - 1.24 mg/dL   Calcium 9.3 8.9 - 10.3 mg/dL   Total Protein 7.0 6.5 - 8.1 g/dL   Albumin 3.3 (L) 3.5 - 5.0 g/dL   AST 53 (H) 15 - 41 U/L   ALT 84 (H) 17 - 63 U/L   Alkaline Phosphatase 60 38 - 126 U/L   Total Bilirubin 0.6 0.3 - 1.2 mg/dL   GFR calc non Af Amer >60 >60 mL/min   GFR calc Af Amer >60 >60 mL/min    Comment: (NOTE) The eGFR has been calculated using the CKD EPI equation. This calculation has not been validated in all clinical situations. eGFR's persistently <60 mL/min signify possible Chronic Kidney Disease.    Anion gap 9 5 - 15  Magnesium     Status: None   Collection Time: 01/09/15   6:25 PM  Result Value Ref Range   Magnesium 1.9 1.7 - 2.4 mg/dL  TSH     Status: None   Collection Time: 01/09/15  6:25 PM  Result Value Ref Range   TSH 0.741 0.350 - 4.500 uIU/mL  Brain natriuretic peptide     Status: None   Collection Time: 01/09/15  6:25 PM  Result Value Ref Range   B Natriuretic Peptide 13.2 0.0 - 100.0 pg/mL  CBC WITH DIFFERENTIAL     Status: None   Collection Time: 01/09/15  6:25 PM  Result Value Ref Range   WBC 5.3 4.0 - 10.5 K/uL   RBC 5.03 4.22 - 5.81 MIL/uL   Hemoglobin 14.6 13.0 - 17.0 g/dL   HCT 42.2 39.0 - 52.0 %   MCV 83.9 78.0 - 100.0 fL   MCH 29.0 26.0 - 34.0 pg   MCHC 34.6 30.0 - 36.0 g/dL   RDW 13.8 11.5 - 15.5 %   Platelets 155 150 - 400 K/uL   Neutrophils Relative % 53 43 - 77 %   Neutro Abs 2.9 1.7 - 7.7 K/uL   Lymphocytes Relative 35 12 - 46 %   Lymphs Abs 1.9 0.7 - 4.0 K/uL   Monocytes Relative 10 3 - 12 %   Monocytes Absolute 0.5 0.1 - 1.0 K/uL   Eosinophils Relative 1 0 - 5 %   Eosinophils Absolute 0.1 0.0 - 0.7 K/uL   Basophils Relative 1 0 - 1 %   Basophils Absolute 0.0 0.0 - 0.1 K/uL  Glucose, capillary     Status: Abnormal   Collection Time: 01/09/15  9:34 PM  Result Value Ref Range   Glucose-Capillary 225 (H) 65 - 99 mg/dL   Comment 1 Capillary Specimen   Basic metabolic panel     Status: Abnormal   Collection Time: 01/10/15  2:57 AM  Result Value Ref Range   Sodium 131 (L) 135 - 145 mmol/L   Potassium 3.6 3.5 - 5.1 mmol/L   Chloride 95 (L) 101 - 111 mmol/L   CO2 27 22 - 32 mmol/L   Glucose, Bld 172 (H) 65 - 99 mg/dL   BUN 7 6 - 20 mg/dL   Creatinine, Ser 0.87 0.61 - 1.24 mg/dL   Calcium 8.8 (L) 8.9 - 10.3 mg/dL   GFR calc non Af Amer >60 >60 mL/min   GFR calc Af Amer >60 >60 mL/min    Comment: (NOTE) The eGFR has  been calculated using the CKD EPI equation. This calculation has not been validated in all clinical situations. eGFR's persistently <60 mL/min signify possible Chronic Kidney Disease.    Anion gap 9 5 -  15  CBC     Status: Abnormal   Collection Time: 01/10/15  2:57 AM  Result Value Ref Range   WBC 6.4 4.0 - 10.5 K/uL   RBC 4.87 4.22 - 5.81 MIL/uL   Hemoglobin 14.3 13.0 - 17.0 g/dL   HCT 41.9 39.0 - 52.0 %   MCV 86.0 78.0 - 100.0 fL   MCH 29.4 26.0 - 34.0 pg   MCHC 34.1 30.0 - 36.0 g/dL   RDW 14.0 11.5 - 15.5 %   Platelets 138 (L) 150 - 400 K/uL  Heparin level (unfractionated)     Status: None   Collection Time: 01/10/15  2:57 AM  Result Value Ref Range   Heparin Unfractionated 0.56 0.30 - 0.70 IU/mL    Comment:        IF HEPARIN RESULTS ARE BELOW EXPECTED VALUES, AND PATIENT DOSAGE HAS BEEN CONFIRMED, SUGGEST FOLLOW UP TESTING OF ANTITHROMBIN III LEVELS.   Glucose, capillary     Status: Abnormal   Collection Time: 01/10/15  7:36 AM  Result Value Ref Range   Glucose-Capillary 192 (H) 65 - 99 mg/dL   Comment 1 Capillary Specimen     Imaging: Nm Myocar Multi W/spect W/wall Motion / Ef  01/08/2015    This is a HIGH RISK study.  Defect 1: There is a large defect of moderate severity present in the  mid anterior, mid anteroseptal, mid anterolateral, apical anterior, apical  inferior, apical lateral and apex location.  Findings consistent with ischemia and prior myocardial infarction with  peri-infarct ischemia.  Upsloping ST segment depression ST segment depression was noted during  stress in the inferolateral leads (II, III, aVF, V5 and V6), and returning  to baseline after 5-9 minutes of recovery.  Blood pressure demonstrated a hypertensive response to exercise with  Poor Exercise tolerance.  The left ventricular ejection fraction is low normal - estimated EF ~53%  (45-54%).   HIGH RISK Test due to Poor Exercise Tolerance, Large-partially reversible  perfusion defect in the likely LAD distribution.  Would consider Cardiac Catheterization.    Assessment:  Principal Problem:   Unstable angina Active Problems:   Type 2 diabetes mellitus without complication   OSA (obstructive  sleep apnea)   Morbid obesity   CAD, multiple vessel   Plan:  D/c amlodipine due to low BP overnight. Continue heparin, b-blocker, aspirin. On CPAP for OSA. A1c was 10, however, CBG is 172. Continue moderate insulin sliding scale. Metformin on hold.  Plan for CABG next week - patient has spoken with Dr. Prescott Gum.  Time Spent Directly with Patient:  15 minutes  Length of Stay:  LOS: 1 day   Pixie Casino, MD, [facility] Attending Cardiologist [place] 01/10/2015, 9:44 AM

## 2015-01-10 NOTE — Progress Notes (Signed)
ANTICOAGULATION CONSULT NOTE - Follow Up Consult  Pharmacy Consult for Heparin  Indication: Awaiting CABG   No Known Allergies  Patient Measurements: Height: 5\' 9"  (175.3 cm) Weight: (!) 317 lb 3.9 oz (143.9 kg) IBW/kg (Calculated) : 70.7  Vital Signs: Temp: 98 F (36.7 C) (07/08 0002) Temp Source: Oral (07/08 0002) BP: 99/68 mmHg (07/08 0400) Pulse Rate: 59 (07/08 0400)  Labs:  Recent Labs  01/09/15 0425 01/09/15 1825 01/10/15 0257  HGB  --  14.6 14.3  HCT  --  42.2 41.9  PLT 156 155 138*  HEPARINUNFRC  --   --  0.56  CREATININE  --  0.80 0.87    Estimated Creatinine Clearance: 134.1 mL/min (by C-G formula based on Cr of 0.87).  Assessment: Therapeutic heparin level  Goal of Therapy:  Heparin level 0.3-0.7 units/ml Monitor platelets by anticoagulation protocol: Yes   Plan:  -Continue heparin at 1800 units/hr -1200 HL -Daily CBC/HL -Monitor for bleeding -F/U plans after TCTS consult  Abran DukeLedford, Cherylin Waguespack 01/10/2015,5:38 AM

## 2015-01-10 NOTE — Progress Notes (Signed)
ANTICOAGULATION CONSULT NOTE - Follow Up Consult  Pharmacy Consult for Heparin  Indication: Awaiting CABG   No Known Allergies  Labs:  Recent Labs  01/09/15 0425 01/09/15 1825 01/10/15 0257 01/10/15 1250 01/10/15 1955  HGB  --  14.6 14.3  --   --   HCT  --  42.2 41.9  --   --   PLT 156 155 138*  --   --   HEPARINUNFRC  --   --  0.56 0.76* 0.66  CREATININE  --  0.80 0.87  --   --     Estimated Creatinine Clearance: 134.1 mL/min (by C-G formula based on Cr of 0.87).  Assessment: 59 year old male transferred from [facility] and admitted 01/09/2015 for 3v CABG. Presented w/ 7/10 CP at OSH. Pharmacy consulted to dose heparin.  PMH: CAD, HTN, HLD, sleep apnea, morbid obesity, newly diagnosed DM  PM HL therapeutic   Goal of Therapy:  Heparin level 0.3-0.7 units/ml Monitor platelets by anticoagulation protocol: Yes   Plan:  Continue heparin at 1650 units / hr Follow up AM labs  Thank you Okey RegalLisa Soraya Paquette, PharmD [phone]  01/10/2015,8:40 PM

## 2015-01-10 NOTE — Progress Notes (Addendum)
ANTICOAGULATION CONSULT NOTE - Follow Up Consult  Pharmacy Consult for Heparin  Indication: Awaiting CABG   No Known Allergies  Labs:  Recent Labs  01/09/15 0425 01/09/15 1825 01/10/15 0257 01/10/15 1250  HGB  --  14.6 14.3  --   HCT  --  42.2 41.9  --   PLT 156 155 138*  --   HEPARINUNFRC  --   --  0.56 0.76*  CREATININE  --  0.80 0.87  --     Estimated Creatinine Clearance: 134.1 mL/min (by C-G formula based on Cr of 0.87).  Assessment: 59 year old male transferred from [facility] and admitted 01/09/2015 for 3v CABG. Presented w/ 7/10 CP at OSH. Pharmacy consulted to dose heparin.  PMH: CAD, HTN, HLD, sleep apnea, morbid obesity, newly diagnosed DM  Anticoagulation/Heme: Heparin for ACS Initial heparin level w/in goal (0.3-0.7 units/ml) H/H stable, plt trending down to 138 On heparin 1800 units/h  Goal of Therapy:  Heparin level 0.3-0.7 units/ml Monitor platelets by anticoagulation protocol: Yes   Plan:  -Continue heparin at 1800 units/hr -Check 1200 HL -Daily CBC/HL -Monitor plt, bleeding -F/U plans after TCTS consult  Floria Ravelinglizabeth T Tien 01/10/2015,12:02 PM  Patient seen and reviewed with Raymondo BandLiz Tien PharmD.  Agree with note and plan.    2:20 PM  Heparin level just above goal.  Will decrease to 1650units/hr and recheck heparin level 6h after rate change.  Angeligue Bowne C

## 2015-01-10 NOTE — Progress Notes (Signed)
Inpatient Diabetes Program Recommendations  AACE/ADA: New Consensus Statement on Inpatient Glycemic Control (2013)  Target Ranges:  Prepandial:   less than 140 mg/dL      Peak postprandial:   less than 180 mg/dL (1-2 hours)      Critically ill patients:  140 - 180 mg/dL   Reason for Visit: Hyperglycemia  Diabetes history: Newly-diagnosed DM Outpatient Diabetes medications: Metformin 1000 mg bid Current orders for Inpatient glycemic control: Novolog moderate tidwc  Results for Craig Harvey, Craig Harvey (MRN [id number]) as of 01/10/2015 10:04  Ref. Range 01/06/2015 07:03  Hemoglobin A1C Latest Ref Range: 4.0-6.0 % 10.1 (H)  Results for Craig Harvey, Craig Harvey (MRN [id number]) as of 01/10/2015 10:04  Ref. Range 01/09/2015 07:29 01/09/2015 13:11 01/09/2015 15:54 01/09/2015 21:34 01/10/2015 07:36  Glucose-Capillary Latest Ref Range: 65-99 mg/dL 153 (H) 180 (H) 264 (H) 225 (H) 192 (H)   Elevated blood sugars.  Inpatient Diabetes Program Recommendations Insulin - Basal: Add Lantus 20 units QHS Correction (SSI): Add HS correction Insulin - Meal Coverage: Add Novolog 3 units tidwc for meal coverage insulin if pt eats > 50% meal HgbA1C: 10.1% - uncontrolled  Note: Diabetes Coordinator at [facility] met with pt and reviewed Survival Skills for Diabetes. Pt aware that he will need a glucose meter, lancets and strips RX at discharge.  Will continue to follow. Thank you. Lorenda Peck, RD, LDN, CDE Inpatient Diabetes Coordinator [phone]

## 2015-01-10 NOTE — Progress Notes (Addendum)
VASCULAR LAB PRELIMINARY  PRELIMINARY  PRELIMINARY  PRELIMINARY  Pre-op Cardiac Surgery  Carotid Findings:  Bilateral:  1-39% ICA stenosis.  Right vertebral artery could not be visualized well enough to evaluate. Left Vertebral artery flow is antegrade.   Upper Extremity Right Left  Brachial Pressures 123 Triphasic 127 Triphasic  Radial Waveforms Triphasic Triphasic  Ulnar Waveforms Triphasic Triphasic  Palmar Arch (Allen's Test) Normal Normal   Findings:  Doppler waveforms remained normal bilaterally with both radial and ulnar compressions    Lower  Extremity Right Left  Dorsalis Pedis 157 Triphasic 138 Triphasic  Anterior Tibial 152 Triphasic 151 Triphasic  Posterior Tibial 152 Triphasic 151 Triphasic  Ankle/Brachial Indices 1.24 1.19    Findings:  ABIs and Doppler waveforms are within normal limits bilaterally at rest   Rachal Dvorsky, RVS 01/10/2015, 2:17 PM

## 2015-01-11 ENCOUNTER — Encounter (HOSPITAL_COMMUNITY): Payer: Self-pay | Admitting: Cardiothoracic Surgery

## 2015-01-11 DIAGNOSIS — I25110 Atherosclerotic heart disease of native coronary artery with unstable angina pectoris: Secondary | ICD-10-CM

## 2015-01-11 LAB — URINALYSIS, ROUTINE W REFLEX MICROSCOPIC
Bilirubin Urine: NEGATIVE
Glucose, UA: 100 mg/dL — AB
Hgb urine dipstick: NEGATIVE
Ketones, ur: NEGATIVE mg/dL
Leukocytes, UA: NEGATIVE
Nitrite: NEGATIVE
Protein, ur: NEGATIVE mg/dL
Specific Gravity, Urine: 1.011 (ref 1.005–1.030)
Urobilinogen, UA: 1 mg/dL (ref 0.0–1.0)
pH: 6 (ref 5.0–8.0)

## 2015-01-11 LAB — CBC
HEMATOCRIT: 41.6 % (ref 39.0–52.0)
HEMOGLOBIN: 14.2 g/dL (ref 13.0–17.0)
MCH: 28.9 pg (ref 26.0–34.0)
MCHC: 34.1 g/dL (ref 30.0–36.0)
MCV: 84.7 fL (ref 78.0–100.0)
Platelets: 134 10*3/uL — ABNORMAL LOW (ref 150–400)
RBC: 4.91 MIL/uL (ref 4.22–5.81)
RDW: 13.9 % (ref 11.5–15.5)
WBC: 6 10*3/uL (ref 4.0–10.5)

## 2015-01-11 LAB — SURGICAL PCR SCREEN
MRSA, PCR: NEGATIVE
Staphylococcus aureus: NEGATIVE

## 2015-01-11 LAB — GLUCOSE, CAPILLARY
GLUCOSE-CAPILLARY: 141 mg/dL — AB (ref 65–99)
Glucose-Capillary: 148 mg/dL — ABNORMAL HIGH (ref 65–99)
Glucose-Capillary: 220 mg/dL — ABNORMAL HIGH (ref 65–99)

## 2015-01-11 LAB — HEPARIN LEVEL (UNFRACTIONATED): Heparin Unfractionated: 0.41 IU/mL (ref 0.30–0.70)

## 2015-01-11 NOTE — Progress Notes (Signed)
Procedure(s) (LRB): CORONARY ARTERY BYPASS GRAFTING (CABG) (N/A) TRANSESOPHAGEAL ECHOCARDIOGRAM (TEE) (N/A) Bjective:\  Patient examined and cath, echocardiogram reviewed Obese diabetic with unstable angina , 2 vessel CAD Plan CABG tues 7-12 Patient stable on iv heparin, NTG   Objective: Vital signs in last 24 hours: Temp:  [97.9 F (36.6 C)-98.5 F (36.9 C)] 97.9 F (36.6 C) (07/09 0745) Pulse Rate:  [63-83] 83 (07/09 0800) Cardiac Rhythm:  [-] Normal sinus rhythm (07/09 0800) Resp:  [10-24] 21 (07/09 0800) BP: (104-173)/(57-89) 125/77 mmHg (07/09 0800) SpO2:  [96 %-100 %] 98 % (07/09 0800)  Hemodynamic parameters for last 24 hours:  stable  Intake/Output from previous day: 07/08 0701 - 07/09 0700 In: 648.5 [P.O.:240; I.V.:408.5] Out: -  Intake/Output this shift: Total I/O In: 33 [I.V.:33] Out: -   No hematoma at cath site  Lab Results:  Recent Labs  01/10/15 0257 01/11/15 0230  WBC 6.4 6.0  HGB 14.3 14.2  HCT 41.9 41.6  PLT 138* 134*   BMET:  Recent Labs  01/09/15 1825 01/10/15 0257  NA 131* 131*  K 3.9 3.6  CL 98* 95*  CO2 24 27  GLUCOSE 201* 172*  BUN 9 7  CREATININE 0.80 0.87  CALCIUM 9.3 8.8*    PT/INR: No results for input(s): LABPROT, INR in the last 72 hours. ABG No results found for: PHART, HCO3, TCO2, ACIDBASEDEF, O2SAT CBG (last 3)   Recent Labs  01/10/15 1720 01/10/15 2237 01/11/15 0742  GLUCAP 165* 220* 148*    Assessment/Plan: S/P Procedure(s) (LRB): CORONARY ARTERY BYPASS GRAFTING (CABG) (N/A) TRANSESOPHAGEAL ECHOCARDIOGRAM (TEE) (N/A) CAD for CABG tues 7-12 Optimize DM control- A-1c 10.0   LOS: 2 days    [name] 01/11/2015

## 2015-01-11 NOTE — Progress Notes (Signed)
At 1610 pt began complaining of 6/10 chest tightness. STAT EKG done and 1 SL nitroglycerin given. Pain relieved. Will continue to assess and monitor the pt closely.

## 2015-01-11 NOTE — Progress Notes (Signed)
CPAP setup at bedside in reach. Humidity chamber filled. No O2 needed. Pt is able to put on CPAP when ready. RT will monitor.

## 2015-01-11 NOTE — Progress Notes (Signed)
Patient Name: Craig ChallengerDavid L Harvey Date of Encounter: 01/11/2015     Principal Problem:   Unstable angina Active Problems:   Type 2 diabetes mellitus without complication   OSA (obstructive sleep apnea)   Morbid obesity   CAD, multiple vessel    SUBJECTIVE  Patient feels well.  No chest pain or shortness of breath.  Awaiting CABG next week.  On IV heparin drip.  CURRENT MEDS . aspirin EC  81 mg Oral Daily  . atorvastatin  80 mg Oral q1800  . insulin aspart  0-15 Units Subcutaneous TID WC & HS  . insulin glargine  24 Units Subcutaneous QHS  . metoprolol succinate  25 mg Oral Daily  . pneumococcal 23 valent vaccine  0.5 mL Intramuscular Tomorrow-1000    OBJECTIVE  Filed Vitals:   01/11/15 0400 01/11/15 0607 01/11/15 0745 01/11/15 0800  BP: 145/74 104/57  125/77  Pulse: 70 71 69 83  Temp:   97.9 F (36.6 C)   TempSrc:   Oral   Resp: 12 24 15 21   Height:      Weight:      SpO2: 100% 98% 97% 98%    Intake/Output Summary (Last 24 hours) at 01/11/15 1013 Last data filed at 01/11/15 0800  Gross per 24 hour  Intake 369.48 ml  Output      0 ml  Net 369.48 ml   Filed Weights   01/09/15 1520  Weight: 317 lb 3.9 oz (143.9 kg)    PHYSICAL EXAM  General: Pleasant, NAD.  Morbidly obese Neuro: Alert and oriented X 3. Moves all extremities spontaneously. Psych: Normal affect. HEENT:  Normal  Neck: Supple without bruits or JVD. Lungs:  Resp regular and unlabored, CTA. Heart: RRR no s3, s4, or murmurs. Abdomen: Soft, non-tender, non-distended, BS + x 4.  Extremities: No clubbing, cyanosis or edema. DP/PT/Radials 2+ and equal bilaterally.  Right groin stable  Accessory Clinical Findings  CBC  Recent Labs  01/09/15 1825 01/10/15 0257 01/11/15 0230  WBC 5.3 6.4 6.0  NEUTROABS 2.9  --   --   HGB 14.6 14.3 14.2  HCT 42.2 41.9 41.6  MCV 83.9 86.0 84.7  PLT 155 138* 134*   Basic Metabolic Panel  Recent Labs  01/09/15 1825 01/10/15 0257  NA 131* 131*  K 3.9  3.6  CL 98* 95*  CO2 24 27  GLUCOSE 201* 172*  BUN 9 7  CREATININE 0.80 0.87  CALCIUM 9.3 8.8*  MG 1.9  --    Liver Function Tests  Recent Labs  01/09/15 1825  AST 53*  ALT 84*  ALKPHOS 60  BILITOT 0.6  PROT 7.0  ALBUMIN 3.3*   No results for input(s): LIPASE, AMYLASE in the last 72 hours. Cardiac Enzymes No results for input(s): CKTOTAL, CKMB, CKMBINDEX, TROPONINI in the last 72 hours. BNP Invalid input(s): POCBNP D-Dimer No results for input(s): DDIMER in the last 72 hours. Hemoglobin A1C No results for input(s): HGBA1C in the last 72 hours. Fasting Lipid Panel  Recent Labs  01/09/15 0425  CHOL 269*  HDL 46  LDLCALC 193*  TRIG 148  CHOLHDL 5.8   Thyroid Function Tests  Recent Labs  01/09/15 1825  TSH 0.741    TELE  Normal sinus rhythm  ECG    Radiology/Studies  Nm Myocar Multi W/spect W/wall Motion / Ef  01/08/2015    This is a HIGH RISK study.  Defect 1: There is a large defect of moderate severity present in the  mid anterior, mid anteroseptal, mid anterolateral, apical anterior, apical  inferior, apical lateral and apex location.  Findings consistent with ischemia and prior myocardial infarction with  peri-infarct ischemia.  Upsloping ST segment depression ST segment depression was noted during  stress in the inferolateral leads (II, III, aVF, V5 and V6), and returning  to baseline after 5-9 minutes of recovery.  Blood pressure demonstrated a hypertensive response to exercise with  Poor Exercise tolerance.  The left ventricular ejection fraction is low normal - estimated EF ~53%  (45-54%).   HIGH RISK Test due to Poor Exercise Tolerance, Large-partially reversible  perfusion defect in the likely LAD distribution.  Would consider Cardiac Catheterization.   Dg Chest Portable 1 View  01/05/2015   CLINICAL DATA:  Intermittent chest pain for 3 days. History of hypertension.  EXAM: PORTABLE CHEST - 1 VIEW  COMPARISON:  None.  FINDINGS: The heart size  and mediastinal contours are within normal limits. Both lungs are clear. The visualized skeletal structures are unremarkable.  IMPRESSION: Normal portable chest radiograph.   Electronically Signed   By: Awilda Metro M.D.   On: 01/05/2015 22:39    ASSESSMENT AND PLAN 1.  Unstable angina.  Multivessel coronary artery disease 2.  Type 2 diabetes mellitus.  A1c is 10 3.  Morbid obesity 4.  Obstructive sleep apnea uses CPAP 5.  Hyperlipidemia on atorvastatin 80 mg daily.   6.  Elevated liver function tests.  Will recheck tomorrow   Signed, Cassell Clement MD

## 2015-01-11 NOTE — Progress Notes (Signed)
ANTICOAGULATION CONSULT NOTE - Follow Up Consult  Pharmacy Consult for Heparin  Indication: Awaiting CABG   No Known Allergies  Labs:  Recent Labs  01/09/15 1825  01/10/15 0257 01/10/15 1250 01/10/15 1955 01/11/15 0230  HGB 14.6  --  14.3  --   --  14.2  HCT 42.2  --  41.9  --   --  41.6  PLT 155  --  138*  --   --  134*  HEPARINUNFRC  --   < > 0.56 0.76* 0.66 0.41  CREATININE 0.80  --  0.87  --   --   --   < > = values in this interval not displayed.  Estimated Creatinine Clearance: 134.1 mL/min (by C-G formula based on Cr of 0.87).  Assessment: 59 year old male transferred from [facility] and admitted 01/09/2015 for 3v CABG. Presented w/ 7/10 CP at OSH. Pharmacy consulted to dose heparin.  PMH: CAD, HTN, HLD, sleep apnea, morbid obesity, newly diagnosed DM  Anticoagulation/Heme: Heparin for ACS Heparin level w/in goal (0.3-0.7 units/ml) H/H stable, plt trending down to 134 On heparin 1800 units/h  Goal of Therapy:  Heparin level 0.3-0.7 units/ml Monitor platelets by anticoagulation protocol: Yes   Plan:  Continue heparin at 1800 units/hr Check heparin level daily, CBC periodically Monitor plt  Thank you, Hillery AldoElizabeth Tien, Pharm.D.  01/11/2015,2:19 PM  Seen with Raymondo BandLiz Tien Pharm.D., Agree with assessment and plan.  Thank you for allowing pharmacy to be a part of this patients care team.  Lovenia KimJulie Aubrey Voong Pharm.D., BCPS, AQ-Cardiology Clinical Pharmacist 01/11/2015 2:52 PM Pager: [phone] Phone: [phone]

## 2015-01-12 LAB — CBC
HCT: 43.7 % (ref 39.0–52.0)
HEMOGLOBIN: 14.9 g/dL (ref 13.0–17.0)
MCH: 29.2 pg (ref 26.0–34.0)
MCHC: 34.1 g/dL (ref 30.0–36.0)
MCV: 85.7 fL (ref 78.0–100.0)
Platelets: 139 10*3/uL — ABNORMAL LOW (ref 150–400)
RBC: 5.1 MIL/uL (ref 4.22–5.81)
RDW: 14 % (ref 11.5–15.5)
WBC: 5.7 10*3/uL (ref 4.0–10.5)

## 2015-01-12 LAB — COMPREHENSIVE METABOLIC PANEL
ALBUMIN: 3.3 g/dL — AB (ref 3.5–5.0)
ALT: 71 U/L — ABNORMAL HIGH (ref 17–63)
ANION GAP: 10 (ref 5–15)
AST: 43 U/L — AB (ref 15–41)
Alkaline Phosphatase: 57 U/L (ref 38–126)
BILIRUBIN TOTAL: 0.7 mg/dL (ref 0.3–1.2)
CO2: 25 mmol/L (ref 22–32)
Calcium: 8.9 mg/dL (ref 8.9–10.3)
Chloride: 98 mmol/L — ABNORMAL LOW (ref 101–111)
Creatinine, Ser: 0.84 mg/dL (ref 0.61–1.24)
GFR calc Af Amer: >60 mL/min (ref >60)
Glucose, Bld: 134 mg/dL — ABNORMAL HIGH (ref 65–99)
Potassium: 3.6 mmol/L (ref 3.5–5.1)
SODIUM: 133 mmol/L — AB (ref 135–145)
Total Protein: 6.9 g/dL (ref 6.5–8.1)

## 2015-01-12 LAB — GLUCOSE, CAPILLARY
GLUCOSE-CAPILLARY: 129 mg/dL — AB (ref 65–99)
GLUCOSE-CAPILLARY: 184 mg/dL — AB (ref 65–99)
GLUCOSE-CAPILLARY: 160 mg/dL — AB (ref 65–99)
GLUCOSE-CAPILLARY: 158 mg/dL — AB (ref 65–99)

## 2015-01-12 LAB — HEPARIN LEVEL (UNFRACTIONATED): HEPARIN UNFRACTIONATED: 0.58 [IU]/mL (ref 0.30–0.70)

## 2015-01-12 NOTE — Progress Notes (Signed)
Patient Name: Craig Harvey Date of Encounter: 01/12/2015     Principal Problem:   Unstable angina Active Problems:   Type 2 diabetes mellitus without complication   OSA (obstructive sleep apnea)   Morbid obesity   CAD, multiple vessel    SUBJECTIVE  The patient feels well today.  He is sitting up in a chair.  He had one episode of chest discomfort yesterday which responded to a single sublingual nitroglycerin.  EKG done at the time showed no new acute changes.  He has had no further discomfort since yesterday.  His rhythm is stable normal sinus rhythm with occasional PACs and blocked PACs  CURRENT MEDS . aspirin EC  81 mg Oral Daily  . atorvastatin  80 mg Oral q1800  . insulin aspart  0-15 Units Subcutaneous TID WC & HS  . insulin glargine  24 Units Subcutaneous QHS  . metoprolol succinate  25 mg Oral Daily  . pneumococcal 23 valent vaccine  0.5 mL Intramuscular Tomorrow-1000    OBJECTIVE  Filed Vitals:   01/12/15 0551 01/12/15 0600 01/12/15 0800 01/12/15 1000  BP: 100/65 106/66 102/63 109/60  Pulse: 66 63    Temp:   98.6 F (37 C)   TempSrc:   Oral   Resp: Height:      Weight:      SpO2: 98% 99% 97% 96%    Intake/Output Summary (Last 24 hours) at 01/12/15 1100 Last data filed at 01/12/15 1000  Gross per 24 hour  Intake 1219.5 ml  Output   2901 ml  Net -1681.5 ml   Filed Weights   01/09/15 1520  Weight: 317 lb 3.9 oz (143.9 kg)    PHYSICAL EXAM  General: Pleasant, NAD. Neuro: Alert and oriented X 3. Moves all extremities spontaneously. Psych: Normal affect. HEENT:  Normal  Neck: Supple without bruits or JVD. Lungs:  Resp regular and unlabored, CTA. Heart: RRR no s3, s4, or murmurs. Abdomen: Soft, non-tender, non-distended, BS + x 4.  Extremities: No clubbing, cyanosis or edema. DP/PT/Radials 2+ and equal bilaterally.  Accessory Clinical Findings  CBC  Recent Labs  01/09/15 1825  01/11/15 0230 01/12/15 0459  WBC 5.3  < > 6.0  5.7  NEUTROABS 2.9  --   --   --   HGB 14.6  < > 14.2 14.9  HCT 42.2  < > 41.6 43.7  MCV 83.9  < > 84.7 85.7  PLT 155  < > 134* 139*  < > = values in this interval not displayed. Basic Metabolic Panel  Recent Labs  01/09/15 1825 01/10/15 0257 01/12/15 0459  NA 131* 131* 133*  K 3.9 3.6 3.6  CL 98* 95* 98*  CO2 GLUCOSE 201* 172* 134*  BUN 9 7 <5*  CREATININE 0.80 0.87 0.84  CALCIUM 9.3 8.8* 8.9  MG 1.9  --   --    Liver Function Tests  Recent Labs  01/09/15 1825 01/12/15 0459  AST 53* 43*  ALT 84* 71*  ALKPHOS 60 57  BILITOT 0.6 0.7  PROT 7.0 6.9  ALBUMIN 3.3* 3.3*   No results for input(s): LIPASE, AMYLASE in the last 72 hours. Cardiac Enzymes No results for input(s): CKTOTAL, CKMB, CKMBINDEX, TROPONINI in the last 72 hours. BNP Invalid input(s): POCBNP D-Dimer No results for input(s): DDIMER in the last 72 hours. Hemoglobin A1C No results for input(s): HGBA1C in the last 72 hours. Fasting Lipid Panel No results for input(s):  CHOL, HDL, LDLCALC, TRIG, CHOLHDL, LDLDIRECT in the last 72 hours. Thyroid Function Tests  Recent Labs  01/09/15 1825  TSH 0.741    TELE  Normal sinus rhythm with PACs and blocked PACs  ECG    Radiology/Studies  Nm Myocar Multi W/spect W/wall Motion / Ef  01/08/2015    This is a HIGH RISK study.  Defect 1: There is a large defect of moderate severity present in the  mid anterior, mid anteroseptal, mid anterolateral, apical anterior, apical  inferior, apical lateral and apex location.  Findings consistent with ischemia and prior myocardial infarction with  peri-infarct ischemia.  Upsloping ST segment depression ST segment depression was noted during  stress in the inferolateral leads (II, III, aVF, V5 and V6), and returning  to baseline after 5-9 minutes of recovery.  Blood pressure demonstrated a hypertensive response to exercise with  Poor Exercise tolerance.  The left ventricular ejection fraction is low normal  - estimated EF ~53%  (45-54%).   HIGH RISK Test due to Poor Exercise Tolerance, Large-partially reversible  perfusion defect in the likely LAD distribution.  Would consider Cardiac Catheterization.   Dg Chest Portable 1 View  01/05/2015   CLINICAL DATA:  Intermittent chest pain for 3 days. History of hypertension.  EXAM: PORTABLE CHEST - 1 VIEW  COMPARISON:  None.  FINDINGS: The heart size and mediastinal contours are within normal limits. Both lungs are clear. The visualized skeletal structures are unremarkable.  IMPRESSION: Normal portable chest radiograph.   Electronically Signed   By: Awilda Metroourtnay  Bloomer M.D.   On: 01/05/2015 22:39    ASSESSMENT AND PLAN  1. Unstable angina. Multivessel coronary artery disease.  Awaiting CABG Tuesday 2. Type 2 diabetes mellitus. A1c is 10.  Blood sugars in the hospital are improving.  Blood sugar 134 today. 3. Morbid obesity 4. Obstructive sleep apnea uses CPAP 5. Hyperlipidemia on atorvastatin 80 mg daily.  6. Elevated liver function tests. Improving.  May be secondary to fatty liver from poorly controlled diabetes.  Plan: Continue current medication.  CABG Tuesday  Signed, Cassell Clementhomas Bentli Llorente MD

## 2015-01-12 NOTE — Progress Notes (Addendum)
ANTICOAGULATION CONSULT NOTE - Follow Up Consult  Pharmacy Consult for Heparin  Indication: Awaiting CABG   No Known Allergies  Labs:  Recent Labs  01/09/15 1825 01/10/15 0257  01/10/15 1955 01/11/15 0230 01/12/15 0459  HGB 14.6 14.3  --   --  14.2 14.9  HCT 42.2 41.9  --   --  41.6 43.7  PLT 155 138*  --   --  134* 139*  HEPARINUNFRC  --  0.56  < > 0.66 0.41 0.58  CREATININE 0.80 0.87  --   --   --  0.84  < > = values in this interval not displayed.  Estimated Creatinine Clearance: 138.9 mL/min (by C-G formula based on Cr of 0.84).  Assessment: 59 year old male transferred from [facility] and admitted 01/09/2015 for 3v CABG. Presented w/ 7/10 CP at OSH. Pharmacy consulted to dose heparin.  PMH: CAD, HTN, HLD, sleep apnea, morbid obesity, newly diagnosed DM  Anticoagulation/Heme: Heparin for ACS Heparin level w/in goal (0.3-0.7 units/ml) H/H stable, plt stable On heparin 1650 units/h  Goal of Therapy:  Heparin level 0.3-0.7 units/ml Monitor platelets by anticoagulation protocol: Yes   Plan:  Continue heparin at 1650 units/hr Check heparin level daily, CBC daily Monitor plt  Thank you, Hillery AldoElizabeth Tien, Pharm.D.   Patient seen and chart reviewed with Hillery AldoElizabeth Tien, Pharm.D.  Agree with assessment and plan above.  Thank you for allowing pharmacy to be a part of this patients care team.  Lovenia KimJulie Daaiyah Baumert Pharm.D., BCPS, AQ-Cardiology Clinical Pharmacist 01/12/2015 3:31 PM Pager: [phone]) [phone] Phone: [phone]) [phone]

## 2015-01-13 ENCOUNTER — Inpatient Hospital Stay (HOSPITAL_COMMUNITY): Payer: Medicaid Other

## 2015-01-13 LAB — HEPARIN LEVEL (UNFRACTIONATED): HEPARIN UNFRACTIONATED: 0.46 [IU]/mL (ref 0.30–0.70)

## 2015-01-13 LAB — GLUCOSE, CAPILLARY
GLUCOSE-CAPILLARY: 132 mg/dL — AB (ref 65–99)
GLUCOSE-CAPILLARY: 141 mg/dL — AB (ref 65–99)
GLUCOSE-CAPILLARY: 172 mg/dL — AB (ref 65–99)
Glucose-Capillary: 166 mg/dL — ABNORMAL HIGH (ref 65–99)

## 2015-01-13 LAB — CBC
HCT: 40.4 % (ref 39.0–52.0)
Hemoglobin: 13.8 g/dL (ref 13.0–17.0)
MCH: 29.2 pg (ref 26.0–34.0)
MCHC: 34.2 g/dL (ref 30.0–36.0)
MCV: 85.6 fL (ref 78.0–100.0)
Platelets: 137 10*3/uL — ABNORMAL LOW (ref 150–400)
RBC: 4.72 MIL/uL (ref 4.22–5.81)
RDW: 13.9 % (ref 11.5–15.5)
WBC: 6.7 10*3/uL (ref 4.0–10.5)

## 2015-01-13 LAB — ABO/RH: ABO/RH(D): A POS

## 2015-01-13 LAB — POCT I-STAT 3, ART BLOOD GAS (G3+)
Acid-base deficit: 1 mmol/L (ref 0.0–2.0)
Bicarbonate: 22.2 mEq/L (ref 20.0–24.0)
O2 Saturation: 98 %
PO2 ART: 97 mmHg (ref 80.0–100.0)
Patient temperature: 98.6
TCO2: 23 mmol/L (ref 0–100)
pCO2 arterial: 31.4 mmHg — ABNORMAL LOW (ref 35.0–45.0)
pH, Arterial: 7.457 — ABNORMAL HIGH (ref 7.350–7.450)

## 2015-01-13 LAB — PREPARE RBC (CROSSMATCH)

## 2015-01-13 MED ORDER — PHENYLEPHRINE HCL 10 MG/ML IJ SOLN
30.0000 ug/min | INTRAVENOUS | Status: AC
  Administered 2015-01-14: 10 ug/min via INTRAVENOUS
  Filled 2015-01-13: qty 2

## 2015-01-13 MED ORDER — TEMAZEPAM 15 MG PO CAPS: 15.0000 mg | ORAL_CAPSULE | Freq: Once | ORAL | Status: AC | PRN

## 2015-01-13 MED ORDER — ALPRAZOLAM 0.25 MG PO TABS: 0.2500 mg | ORAL_TABLET | ORAL | Status: DC | PRN

## 2015-01-13 MED ORDER — MAGNESIUM SULFATE 50 % IJ SOLN
40.0000 meq | INTRAMUSCULAR | Status: DC
  Filled 2015-01-13: qty 10

## 2015-01-13 MED ORDER — PLASMA-LYTE 148 IV SOLN
INTRAVENOUS | Status: AC
  Administered 2015-01-14: 500 mL
  Filled 2015-01-13: qty 2.5

## 2015-01-13 MED ORDER — DEXTROSE 5 % IV SOLN
1.5000 g | INTRAVENOUS | Status: AC
  Administered 2015-01-14: .75 g via INTRAVENOUS
  Administered 2015-01-14: 1.5 g via INTRAVENOUS
  Filled 2015-01-13: qty 1.5

## 2015-01-13 MED ORDER — VANCOMYCIN HCL 10 G IV SOLR
1500.0000 mg | INTRAVENOUS | Status: AC
  Administered 2015-01-14: 1500 mg via INTRAVENOUS
  Filled 2015-01-13: qty 1500

## 2015-01-13 MED ORDER — SODIUM CHLORIDE 0.9 % IV SOLN
INTRAVENOUS | Status: DC
  Filled 2015-01-13: qty 30

## 2015-01-13 MED ORDER — CHLORHEXIDINE GLUCONATE 4 % EX LIQD
60.0000 mL | Freq: Once | CUTANEOUS | Status: AC
  Administered 2015-01-13: 4 via TOPICAL
  Filled 2015-01-13: qty 60

## 2015-01-13 MED ORDER — NITROGLYCERIN IN D5W 200-5 MCG/ML-% IV SOLN
2.0000 ug/min | INTRAVENOUS | Status: AC
  Administered 2015-01-14: 5 ug/min via INTRAVENOUS
  Filled 2015-01-13: qty 250

## 2015-01-13 MED ORDER — METOPROLOL TARTRATE 12.5 MG HALF TABLET
12.5000 mg | ORAL_TABLET | Freq: Once | ORAL | Status: AC
  Administered 2015-01-14: 12.5 mg via ORAL
  Filled 2015-01-13: qty 1

## 2015-01-13 MED ORDER — DOPAMINE-DEXTROSE 3.2-5 MG/ML-% IV SOLN
0.0000 ug/kg/min | INTRAVENOUS | Status: DC
Start: 2015-01-14 — End: 2015-01-14
  Filled 2015-01-13: qty 250

## 2015-01-13 MED ORDER — SODIUM CHLORIDE 0.9 % IV SOLN
INTRAVENOUS | Status: AC
  Administered 2015-01-14: 2.1 [IU]/h via INTRAVENOUS
  Filled 2015-01-13: qty 2.5

## 2015-01-13 MED ORDER — DEXTROSE 5 % IV SOLN
0.0000 ug/min | INTRAVENOUS | Status: DC
  Filled 2015-01-13: qty 4

## 2015-01-13 MED ORDER — DIAZEPAM 5 MG PO TABS
5.0000 mg | ORAL_TABLET | Freq: Once | ORAL | Status: AC
  Administered 2015-01-14: 5 mg via ORAL
  Filled 2015-01-13: qty 1

## 2015-01-13 MED ORDER — DEXMEDETOMIDINE HCL IN NACL 400 MCG/100ML IV SOLN
0.1000 ug/kg/h | INTRAVENOUS | Status: AC
  Administered 2015-01-14: .3 ug/kg/h via INTRAVENOUS
  Filled 2015-01-13: qty 100

## 2015-01-13 MED ORDER — SODIUM CHLORIDE 0.9 % IV SOLN
INTRAVENOUS | Status: AC
  Administered 2015-01-14: 69.8 mL/h via INTRAVENOUS
  Filled 2015-01-13: qty 40

## 2015-01-13 MED ORDER — CHLORHEXIDINE GLUCONATE 4 % EX LIQD
60.0000 mL | Freq: Once | CUTANEOUS | Status: AC
  Administered 2015-01-14: 4 via TOPICAL
  Filled 2015-01-13: qty 60

## 2015-01-13 MED ORDER — POTASSIUM CHLORIDE 2 MEQ/ML IV SOLN
80.0000 meq | INTRAVENOUS | Status: DC
  Filled 2015-01-13: qty 40

## 2015-01-13 MED ORDER — BISACODYL 5 MG PO TBEC: 5.0000 mg | DELAYED_RELEASE_TABLET | Freq: Once | ORAL | Status: DC

## 2015-01-13 MED ORDER — DEXTROSE 5 % IV SOLN
750.0000 mg | INTRAVENOUS | Status: DC
  Filled 2015-01-13: qty 750

## 2015-01-13 NOTE — Progress Notes (Signed)
CARDIAC REHAB PHASE I   PRE:  Rate/Rhythm: 95 SR  BP:  Supine:   Sitting: 125/79  Standing:    SaO2: 97%RA  MODE:  Ambulation: 510 ft   POST:  Rate/Rhythm: 114 ST  BP:  Supine:   Sitting: 147/85  Standing:    SaO2: 99%RA 0940-1000 Pt walked 510 ft on RA with steady gait. No CP. Tolerated well. Stated he was going to watch pre op video in a short while. To recliner with call bell. Will follow up after surgery.   Luetta Nuttingharlene Lorynn Moeser, RN BSN  01/13/2015 9:58 AM

## 2015-01-13 NOTE — Progress Notes (Signed)
Pt watching post-op cardiac surgery video

## 2015-01-13 NOTE — Progress Notes (Signed)
Patient Name: Craig Harvey Date of Encounter: 01/13/2015     Principal Problem:   Unstable angina Active Problems:   Type 2 diabetes mellitus without complication   OSA (obstructive sleep apnea)   Morbid obesity   CAD, multiple vessel    SUBJECTIVE  59 yo with hx of HTN, presented to [facility] ER with CP  Cath revealed : Right dominant coronary arterial system Severe two-vessel disease: Ostial LAD estimated at 70-80%, eccentric calcification, LAD is a large vessel Also with critical proximal LAD lesion estimated at 95-99%. Proximal circumflex with severe 95% lesion after the takeoff of OM1, long, calcified, circumflex is a large vessel Mid circumflex also with 80% lesion, focal, prior to the takeoff of OM 2 Right coronary artery with mild diffuse disease.  Scheduled for CABG on July 12.    The patient feels well today.  He ambulated over 500 feet today with cardiac rehabilitation. He did well and did not have any angina.  CURRENT MEDS . aspirin EC  81 mg Oral Daily  . atorvastatin  80 mg Oral q1800  . bisacodyl  5 mg Oral Once  . chlorhexidine  60 mL Topical Once   And  . [START ON 01/14/2015] chlorhexidine  60 mL Topical Once  . [START ON 01/14/2015] diazepam  5 mg Oral Once  . insulin aspart  0-15 Units Subcutaneous TID WC & HS  . insulin glargine  24 Units Subcutaneous QHS  . metoprolol succinate  25 mg Oral Daily  . [START ON 01/14/2015] metoprolol tartrate  12.5 mg Oral Once    OBJECTIVE  Filed Vitals:   01/13/15 0200 01/13/15 0400 01/13/15 0600 01/13/15 0802  BP: 92/46 99/52 115/64 116/75  Pulse: 60 61 62 68  Temp:  98.5 F (36.9 C)  97.8 F (36.6 C)  TempSrc:  Oral    Resp: 0 Height:      Weight:      SpO2: 94% 96% 96% 100%    Intake/Output Summary (Last 24 hours) at 01/13/15 0952 Last data filed at 01/13/15 0900  Gross per 24 hour  Intake 1639.5 ml  Output   2751 ml  Net -1111.5 ml   Filed Weights   01/09/15 1520  Weight: 143.9 kg  (317 lb 3.9 oz)    PHYSICAL EXAM  General: Pleasant, NAD. Neuro: Alert and oriented X 3. Moves all extremities spontaneously. Psych: Normal affect. HEENT:  Normal  Neck: Supple without bruits or JVD. Lungs:  Resp regular and unlabored, CTA. Heart: RRR no s3, s4, or murmurs. Abdomen: Soft, non-tender, non-distended, BS + x 4.  Extremities: No clubbing, cyanosis or edema. DP/PT/Radials 2+ and equal bilaterally.  Accessory Clinical Findings  CBC  Recent Labs  01/12/15 0459 01/13/15 0244  WBC 5.7 6.7  HGB 14.9 13.8  HCT 43.7 40.4  MCV 85.7 85.6  PLT 139* 137*   Basic Metabolic Panel  Recent Labs  01/12/15 0459  NA 133*  K 3.6  CL 98*  CO2 25  GLUCOSE 134*  BUN <5*  CREATININE 0.84  CALCIUM 8.9   Liver Function Tests  Recent Labs  01/12/15 0459  AST 43*  ALT 71*  ALKPHOS 57  BILITOT 0.7  PROT 6.9  ALBUMIN 3.3*   No results for input(s): LIPASE, AMYLASE in the last 72 hours. Cardiac Enzymes No results for input(s): CKTOTAL, CKMB, CKMBINDEX, TROPONINI in the last 72 hours. BNP Invalid input(s): POCBNP D-Dimer No results for input(s): DDIMER in the last 72  hours. Hemoglobin A1C No results for input(s): HGBA1C in the last 72 hours. Fasting Lipid Panel No results for input(s): CHOL, HDL, LDLCALC, TRIG, CHOLHDL, LDLDIRECT in the last 72 hours. Thyroid Function Tests No results for input(s): TSH, T4TOTAL, T3FREE, THYROIDAB in the last 72 hours.  Invalid input(s): FREET3  TELE  Normal sinus rhythm with PACs and blocked PACs  ECG    Radiology/Studies  Nm Myocar Multi W/spect W/wall Motion / Ef  01/08/2015    This is a HIGH RISK study.  Defect 1: There is a large defect of moderate severity present in the  mid anterior, mid anteroseptal, mid anterolateral, apical anterior, apical  inferior, apical lateral and apex location.  Findings consistent with ischemia and prior myocardial infarction with  peri-infarct ischemia.  Upsloping ST segment  depression ST segment depression was noted during  stress in the inferolateral leads (II, III, aVF, V5 and V6), and returning  to baseline after 5-9 minutes of recovery.  Blood pressure demonstrated a hypertensive response to exercise with  Poor Exercise tolerance.  The left ventricular ejection fraction is low normal - estimated EF ~53%  (45-54%).   HIGH RISK Test due to Poor Exercise Tolerance, Large-partially reversible  perfusion defect in the likely LAD distribution.  Would consider Cardiac Catheterization.   Dg Chest Port 1 View  01/13/2015   CLINICAL DATA:  Coronary artery disease. Preoperative for coronary artery bypass grafting  EXAM: PORTABLE CHEST - 1 VIEW  COMPARISON:  January 05, 2015  FINDINGS: The lungs are clear. The heart size and pulmonary vascularity are normal. No adenopathy. No bone lesions.  IMPRESSION: No abnormality noted.   Electronically Signed   By: Bretta BangWilliam  Woodruff III M.D.   On: 01/13/2015 09:06   Dg Chest Portable 1 View  01/05/2015   CLINICAL DATA:  Intermittent chest pain for 3 days. History of hypertension.  EXAM: PORTABLE CHEST - 1 VIEW  COMPARISON:  None.  FINDINGS: The heart size and mediastinal contours are within normal limits. Both lungs are clear. The visualized skeletal structures are unremarkable.  IMPRESSION: Normal portable chest radiograph.   Electronically Signed   By: Awilda Metroourtnay  Bloomer M.D.   On: 01/05/2015 22:39    ASSESSMENT AND PLAN  1. Unstable angina. Multivessel coronary artery disease.  Awaiting CABG Tuesday 2. Type 2 diabetes mellitus. A1c is 10.  Blood sugars in the hospital are improving.  Blood sugar 134 today. 3. Morbid obesity 4. Obstructive sleep apnea uses CPAP 5. Hyperlipidemia on atorvastatin 80 mg daily.  6. Elevated liver function tests. Improving.  May be secondary to fatty liver from poorly controlled diabetes.  Plan: Continue current medication.  CABG Tuesday     Fidelia Cathers, Deloris PingPhilip J, MD  01/13/2015 9:56 AM    [facility] [address],  [address] [place], [place]  [address] Pager [phone] Phone: [phone](336) [phone]; Fax: [phone]   [facility]  [address] [place], [place]  [phone]    Fax [phone]

## 2015-01-13 NOTE — Progress Notes (Signed)
Pt watching pre-op cardiac surgery video

## 2015-01-13 NOTE — Progress Notes (Signed)
ANTICOAGULATION CONSULT NOTE - Follow Up Consult  Pharmacy Consult for Heparin  Indication: Awaiting CABG   No Known Allergies  Labs:  Recent Labs  01/11/15 0230 01/12/15 0459 01/13/15 0244  HGB 14.2 14.9 13.8  HCT 41.6 43.7 40.4  PLT 134* 139* 137*  HEPARINUNFRC 0.41 0.58 0.46  CREATININE  --  0.84  --     Estimated Creatinine Clearance: 138.9 mL/min (by C-G formula based on Cr of 0.84).  Assessment: 59 year old male transferred [facility] and admitted 01/09/2015 for 3v CABG. Presented w/ 7/10 CP at OSH. Pharmacy consulted to dose heparin.  PMH: CAD, HTN, HLD, sleep apnea, morbid obesity, newly diagnosed DM  Anticoagulation/Heme: Heparin for ACS Heparin level w/in goal (0.3-0.7 units/ml) H/H stable, plt stable On heparin 1650 units/h. No bleeding noted.   Goal of Therapy:  Heparin level 0.3-0.7 units/ml Monitor platelets by anticoagulation protocol: Yes   Plan:  Continue heparin at 1650 units/hr Check heparin level daily, CBC daily Monitor plt  Vinnie LevelBenjamin Shakeila Pfarr, PharmD., BCPS Clinical Pharmacist Pager [phone]

## 2015-01-13 NOTE — Progress Notes (Signed)
Procedure(s) (LRB): CORONARY ARTERY BYPASS GRAFTING (CABG) (N/A) TRANSESOPHAGEAL ECHOCARDIOGRAM (TEE) (N/A) Subjective: No angina on iv heparin  Objective: Vital signs in last 24 hours: Temp:  [96.8 F (36 C)-98.5 F (36.9 C)] 96.8 F (36 C) (07/11 1310) Pulse Rate:  [60-104] 72 (07/11 1400) Cardiac Rhythm:  [-] Normal sinus rhythm (07/11 1400) Resp:  [0-24] 17 (07/11 1400) BP: (92-166)/(46-90) 139/69 mmHg (07/11 1400) SpO2:  [94 %-100 %] 97 % (07/11 1400)  Hemodynamic parameters for last 24 hours:   stable, nsr Intake/Output from previous day: 07/10 0701 - 07/11 0700 In: 1879.5 [P.O.:1500; I.V.:379.5] Out: 2351 [Urine:2350; Stool:1] Intake/Output this shift: Total I/O In: 115.5 [I.V.:115.5] Out: 1400 [Urine:1400]  Lungs clear No pedal edema  Lab Results:  Recent Labs  01/12/15 0459 01/13/15 0244  WBC 5.7 6.7  HGB 14.9 13.8  HCT 43.7 40.4  PLT 139* 137*   BMET:  Recent Labs  01/12/15 0459  NA 133*  K 3.6  CL 98*  CO2 25  GLUCOSE 134*  BUN <5*  CREATININE 0.84  CALCIUM 8.9    PT/INR: No results for input(s): LABPROT, INR in the last 72 hours. ABG    Component Value Date/Time   PHART 7.457* 01/13/2015 1509   HCO3 22.2 01/13/2015 1509   TCO2 23 01/13/2015 1509   ACIDBASEDEF 1.0 01/13/2015 1509   O2SAT 98.0 01/13/2015 1509   CBG (last 3)   Recent Labs  01/12/15 2156 01/13/15 0801 01/13/15 1254  GLUCAP 158* 132* 166*    Assessment/Plan: S/P Procedure(s) (LRB): CORONARY ARTERY BYPASS GRAFTING (CABG) (N/A) TRANSESOPHAGEAL ECHOCARDIOGRAM (TEE) (N/A)  CABG x 3 in am I ha ve discussed the details of surgery asa well as the benefits/ risks of CABG with patient and he agrees to procede   LOS: 4 days    [name] 01/13/2015

## 2015-01-13 NOTE — Progress Notes (Signed)
Pt had 5 beat run of Vtach, baseline rhythm sinus 60s. BP 142/90. MD notified. No orders received.

## 2015-01-13 NOTE — Progress Notes (Signed)
ABG results obtained on patient on room air:   Ref. Range 01/13/2015 15:09  Sample type Unknown ARTERIAL  pH, Arterial Latest Ref Range: 7.350-7.450  7.457 (H)  pCO2 arterial Latest Ref Range: 35.0-45.0 mmHg 31.4 (L)  pO2, Arterial Latest Ref Range: 80.0-100.0 mmHg 97.0  Bicarbonate Latest Ref Range: 20.0-24.0 mEq/L 22.2  TCO2 Latest Ref Range: 0-100 mmol/L 23  Acid-base deficit Latest Ref Range: 0.0-2.0 mmol/L 1.0  O2 Saturation Latest Units: % 98.0  Patient temperature Unknown 98.6 F  Collection site Unknown RADIAL, ALLEN'S T.Marland Kitchen..Marland Kitchen

## 2015-01-13 NOTE — Progress Notes (Signed)
Inpatient Diabetes Program Recommendations  AACE/ADA: New Consensus Statement on Inpatient Glycemic Control (2013)  Target Ranges:  Prepandial:   less than 140 mg/dL      Peak postprandial:   less than 180 mg/dL (1-2 hours)      Critically ill patients:  140 - 180 mg/dL   Reason for DM Coordinator Visit: A1c level 10.1% and lack of insurance  Diabetes history: None Current orders for Inpatient glycemic control: Lantus 24 units QHS, Novolog Moderate scale (0-15 units) TID  Note: Spoke with pt again about new diabetes diagnosis. Discussed A1C results and explained what an A1C is, basic pathophysiology of DM Type 2, basic home care, importance of checking CBGs and maintaining good CBG control to prevent long-term and short-term complications. Reviewed signs and symptoms of hyperglycemia and hypoglycemia along with treatment for both. RNs to provide ongoing basic DM education at bedside with this patient and engage patient to actively check blood glucose and administer insulin injections. Discussed impact of nutrition, exercise, and medications on diabetes control.  Discussed carbohydrates, carbohydrate goals per day and meal, along with portion sizes. Due to lack of insurance, patient will need to be converted to 70/30 insulin while inpatient a couple of days after surgery when diet is restarted and has good PO intake. . Due to glucose control on current regimen the conversion to 70/30 will be 17 units BID. The patient will need a prescription for the [employer] Novolin brand of 70/30 at time of discharge (out of pocket cost $25). Patient told me lifestyle modifications, checking glucose, and taking medication daily was very doable.   At time of Discharge patient will need Glucose meter kit (ReliOn meter at [employer] $15 and strips 50 ct.for $9) ReliOn Novolin 70/30 17 units BID  (Or patient will need to follow up at the [facility] and get the insulin samples and meter that they  have)  Thanks,  Tama Headings RN, MSN, [facility] Inpatient Diabetes Coordinator Team Pager [phone]

## 2015-01-14 ENCOUNTER — Inpatient Hospital Stay (HOSPITAL_COMMUNITY): Payer: Medicaid Other

## 2015-01-14 ENCOUNTER — Inpatient Hospital Stay (HOSPITAL_COMMUNITY): Payer: Medicaid Other | Admitting: Anesthesiology

## 2015-01-14 ENCOUNTER — Encounter (HOSPITAL_COMMUNITY)
Admission: AD | Disposition: A | Discharge status: Home / Self Care | Payer: Self-pay | Source: Other Acute Inpatient Hospital | Attending: Cardiothoracic Surgery

## 2015-01-14 DIAGNOSIS — Z951 Presence of aortocoronary bypass graft: Secondary | ICD-10-CM

## 2015-01-14 HISTORY — PX: CORONARY ARTERY BYPASS GRAFT: SHX141

## 2015-01-14 HISTORY — PX: TEE WITHOUT CARDIOVERSION: SHX5443

## 2015-01-14 LAB — POCT I-STAT 4, (NA,K, GLUC, HGB,HCT)
GLUCOSE: 150 mg/dL — AB (ref 65–99)
HCT: 32 % — ABNORMAL LOW (ref 39.0–52.0)
Hemoglobin: 10.9 g/dL — ABNORMAL LOW (ref 13.0–17.0)
Potassium: 3.4 mmol/L — ABNORMAL LOW (ref 3.5–5.1)
Sodium: 140 mmol/L (ref 135–145)

## 2015-01-14 LAB — POCT I-STAT, CHEM 8
BUN: 7 mg/dL (ref 6–20)
BUN: 6 mg/dL (ref 6–20)
BUN: 6 mg/dL (ref 6–20)
BUN: 6 mg/dL (ref 6–20)
BUN: 6 mg/dL (ref 6–20)
BUN: 7 mg/dL (ref 6–20)
CALCIUM ION: 1.21 mmol/L (ref 1.12–1.23)
CALCIUM ION: 1.1 mmol/L — AB (ref 1.12–1.23)
CALCIUM ION: 1.13 mmol/L (ref 1.12–1.23)
CALCIUM ION: 1.15 mmol/L (ref 1.12–1.23)
CHLORIDE: 98 mmol/L — AB (ref 101–111)
CREATININE: 0.7 mg/dL (ref 0.61–1.24)
CREATININE: 0.7 mg/dL (ref 0.61–1.24)
Calcium, Ion: 1.2 mmol/L (ref 1.12–1.23)
Calcium, Ion: 1.09 mmol/L — ABNORMAL LOW (ref 1.12–1.23)
Chloride: 97 mmol/L — ABNORMAL LOW (ref 101–111)
Chloride: 94 mmol/L — ABNORMAL LOW (ref 101–111)
Chloride: 98 mmol/L — ABNORMAL LOW (ref 101–111)
Chloride: 96 mmol/L — ABNORMAL LOW (ref 101–111)
Chloride: 101 mmol/L (ref 101–111)
Creatinine, Ser: 0.7 mg/dL (ref 0.61–1.24)
Creatinine, Ser: 0.7 mg/dL (ref 0.61–1.24)
Creatinine, Ser: 0.6 mg/dL — ABNORMAL LOW (ref 0.61–1.24)
Creatinine, Ser: 0.7 mg/dL (ref 0.61–1.24)
GLUCOSE: 204 mg/dL — AB (ref 65–99)
GLUCOSE: 139 mg/dL — AB (ref 65–99)
Glucose, Bld: 163 mg/dL — ABNORMAL HIGH (ref 65–99)
Glucose, Bld: 138 mg/dL — ABNORMAL HIGH (ref 65–99)
Glucose, Bld: 153 mg/dL — ABNORMAL HIGH (ref 65–99)
Glucose, Bld: 177 mg/dL — ABNORMAL HIGH (ref 65–99)
HCT: 42 % (ref 39.0–52.0)
HCT: 32 % — ABNORMAL LOW (ref 39.0–52.0)
HCT: 33 % — ABNORMAL LOW (ref 39.0–52.0)
HCT: 39 % (ref 39.0–52.0)
HEMATOCRIT: 39 % (ref 39.0–52.0)
HEMATOCRIT: 32 % — AB (ref 39.0–52.0)
HEMOGLOBIN: 10.9 g/dL — AB (ref 13.0–17.0)
HEMOGLOBIN: 13.3 g/dL (ref 13.0–17.0)
Hemoglobin: 14.3 g/dL (ref 13.0–17.0)
Hemoglobin: 10.9 g/dL — ABNORMAL LOW (ref 13.0–17.0)
Hemoglobin: 13.3 g/dL (ref 13.0–17.0)
Hemoglobin: 11.2 g/dL — ABNORMAL LOW (ref 13.0–17.0)
Potassium: 4.4 mmol/L (ref 3.5–5.1)
Potassium: 3.8 mmol/L (ref 3.5–5.1)
Potassium: 4 mmol/L (ref 3.5–5.1)
Potassium: 4.7 mmol/L (ref 3.5–5.1)
Potassium: 4.4 mmol/L (ref 3.5–5.1)
Potassium: 4.5 mmol/L (ref 3.5–5.1)
Sodium: 132 mmol/L — ABNORMAL LOW (ref 135–145)
Sodium: 132 mmol/L — ABNORMAL LOW (ref 135–145)
Sodium: 133 mmol/L — ABNORMAL LOW (ref 135–145)
Sodium: 130 mmol/L — ABNORMAL LOW (ref 135–145)
Sodium: 133 mmol/L — ABNORMAL LOW (ref 135–145)
Sodium: 136 mmol/L (ref 135–145)
TCO2: 27 mmol/L (ref 0–100)
TCO2: 25 mmol/L (ref 0–100)
TCO2: 27 mmol/L (ref 0–100)
TCO2: 25 mmol/L (ref 0–100)
TCO2: 22 mmol/L (ref 0–100)
TCO2: 22 mmol/L (ref 0–100)

## 2015-01-14 LAB — CBC
HCT: 33.6 % — ABNORMAL LOW (ref 39.0–52.0)
HCT: 37 % — ABNORMAL LOW (ref 39.0–52.0)
HEMATOCRIT: 39.3 % (ref 39.0–52.0)
Hemoglobin: 13.5 g/dL (ref 13.0–17.0)
Hemoglobin: 11.5 g/dL — ABNORMAL LOW (ref 13.0–17.0)
Hemoglobin: 12.9 g/dL — ABNORMAL LOW (ref 13.0–17.0)
MCH: 28.9 pg (ref 26.0–34.0)
MCH: 28.8 pg (ref 26.0–34.0)
MCH: 29.5 pg (ref 26.0–34.0)
MCHC: 34.4 g/dL (ref 30.0–36.0)
MCHC: 34.2 g/dL (ref 30.0–36.0)
MCHC: 34.9 g/dL (ref 30.0–36.0)
MCV: 84.2 fL (ref 78.0–100.0)
MCV: 84.2 fL (ref 78.0–100.0)
MCV: 84.5 fL (ref 78.0–100.0)
Platelets: 122 10*3/uL — ABNORMAL LOW (ref 150–400)
Platelets: 88 10*3/uL — ABNORMAL LOW (ref 150–400)
Platelets: 150 10*3/uL (ref 150–400)
RBC: 4.67 MIL/uL (ref 4.22–5.81)
RBC: 3.99 MIL/uL — AB (ref 4.22–5.81)
RBC: 4.38 MIL/uL (ref 4.22–5.81)
RDW: 13.9 % (ref 11.5–15.5)
RDW: 13.9 % (ref 11.5–15.5)
RDW: 13.9 % (ref 11.5–15.5)
WBC: 5.7 10*3/uL (ref 4.0–10.5)
WBC: 7.2 10*3/uL (ref 4.0–10.5)
WBC: 12.1 10*3/uL — ABNORMAL HIGH (ref 4.0–10.5)

## 2015-01-14 LAB — POCT I-STAT 3, ART BLOOD GAS (G3+)
ACID-BASE DEFICIT: 8 mmol/L — AB (ref 0.0–2.0)
Acid-base deficit: 2 mmol/L (ref 0.0–2.0)
Acid-base deficit: 1 mmol/L (ref 0.0–2.0)
Acid-base deficit: 2 mmol/L (ref 0.0–2.0)
BICARBONATE: 16.1 meq/L — AB (ref 20.0–24.0)
BICARBONATE: 23.8 meq/L (ref 20.0–24.0)
BICARBONATE: 23.2 meq/L (ref 20.0–24.0)
Bicarbonate: 26.1 mEq/L — ABNORMAL HIGH (ref 20.0–24.0)
Bicarbonate: 23.1 mEq/L (ref 20.0–24.0)
O2 SAT: 100 %
O2 SAT: 99 %
O2 SAT: 94 %
O2 SAT: 96 %
O2 SAT: 95 %
PCO2 ART: 41.7 mmHg (ref 35.0–45.0)
PCO2 ART: 40.8 mmHg (ref 35.0–45.0)
PH ART: 7.369 (ref 7.350–7.450)
PH ART: 7.363 (ref 7.350–7.450)
PO2 ART: 68 mmHg — AB (ref 80.0–100.0)
PO2 ART: 80 mmHg (ref 80.0–100.0)
Patient temperature: 35.9
Patient temperature: 36.9
TCO2: 27 mmol/L (ref 0–100)
TCO2: 24 mmol/L (ref 0–100)
TCO2: 17 mmol/L (ref 0–100)
TCO2: 25 mmol/L (ref 0–100)
TCO2: 24 mmol/L (ref 0–100)
pCO2 arterial: 45.5 mmHg — ABNORMAL HIGH (ref 35.0–45.0)
pCO2 arterial: 27.5 mmHg — ABNORMAL LOW (ref 35.0–45.0)
pCO2 arterial: 37.5 mmHg (ref 35.0–45.0)
pH, Arterial: 7.368 (ref 7.350–7.450)
pH, Arterial: 7.352 (ref 7.350–7.450)
pH, Arterial: 7.408 (ref 7.350–7.450)
pO2, Arterial: 278 mmHg — ABNORMAL HIGH (ref 80.0–100.0)
pO2, Arterial: 134 mmHg — ABNORMAL HIGH (ref 80.0–100.0)
pO2, Arterial: 77 mmHg — ABNORMAL LOW (ref 80.0–100.0)

## 2015-01-14 LAB — BASIC METABOLIC PANEL
Anion gap: 9 (ref 5–15)
BUN: 7 mg/dL (ref 6–20)
CO2: 25 mmol/L (ref 22–32)
Calcium: 9.2 mg/dL (ref 8.9–10.3)
Chloride: 98 mmol/L — ABNORMAL LOW (ref 101–111)
Creatinine, Ser: 0.68 mg/dL (ref 0.61–1.24)
GFR calc Af Amer: >60 mL/min (ref >60)
GFR calc non Af Amer: >60 mL/min (ref >60)
Glucose, Bld: 136 mg/dL — ABNORMAL HIGH (ref 65–99)
Potassium: 3.8 mmol/L (ref 3.5–5.1)
Sodium: 132 mmol/L — ABNORMAL LOW (ref 135–145)

## 2015-01-14 LAB — HEMOGLOBIN AND HEMATOCRIT, BLOOD
HCT: 29.1 % — ABNORMAL LOW (ref 39.0–52.0)
Hemoglobin: 9.9 g/dL — ABNORMAL LOW (ref 13.0–17.0)

## 2015-01-14 LAB — GLUCOSE, CAPILLARY: Glucose-Capillary: 164 mg/dL — ABNORMAL HIGH (ref 65–99)

## 2015-01-14 LAB — HEPARIN LEVEL (UNFRACTIONATED): Heparin Unfractionated: 0.41 IU/mL (ref 0.30–0.70)

## 2015-01-14 LAB — MAGNESIUM: Magnesium: 2.8 mg/dL — ABNORMAL HIGH (ref 1.7–2.4)

## 2015-01-14 LAB — PROTIME-INR
INR: 1.31 (ref 0.00–1.49)
INR: 1.65 — ABNORMAL HIGH (ref 0.00–1.49)
PROTHROMBIN TIME: 19.5 s — AB (ref 11.6–15.2)
Prothrombin Time: 16.4 seconds — ABNORMAL HIGH (ref 11.6–15.2)

## 2015-01-14 LAB — APTT
APTT: 110 s — AB (ref 24–37)
APTT: 35 s (ref 24–37)

## 2015-01-14 LAB — CREATININE, SERUM
Creatinine, Ser: 0.77 mg/dL (ref 0.61–1.24)
GFR calc Af Amer: >60 mL/min (ref >60)
GFR calc non Af Amer: >60 mL/min (ref >60)

## 2015-01-14 LAB — PLATELET COUNT: Platelets: 145 10*3/uL — ABNORMAL LOW (ref 150–400)

## 2015-01-14 SURGERY — CORONARY ARTERY BYPASS GRAFTING (CABG)
Anesthesia: General | Site: Chest

## 2015-01-14 MED ORDER — PROPOFOL 10 MG/ML IV BOLUS
INTRAVENOUS | Status: AC
  Filled 2015-01-14: qty 20

## 2015-01-14 MED ORDER — DEXMEDETOMIDINE HCL IN NACL 200 MCG/50ML IV SOLN
0.0000 ug/kg/h | INTRAVENOUS | Status: DC
  Administered 2015-01-14: 0.5 ug/kg/h via INTRAVENOUS
  Filled 2015-01-14: qty 50

## 2015-01-14 MED ORDER — SUCCINYLCHOLINE CHLORIDE 20 MG/ML IJ SOLN
INTRAMUSCULAR | Status: DC | PRN
  Administered 2015-01-14: 160 mg via INTRAVENOUS

## 2015-01-14 MED ORDER — LACTATED RINGERS IV SOLN
INTRAVENOUS | Status: DC
  Administered 2015-01-14: 14:00:00 via INTRAVENOUS

## 2015-01-14 MED ORDER — LIDOCAINE HCL (CARDIAC) 20 MG/ML IV SOLN
INTRAVENOUS | Status: DC | PRN
  Administered 2015-01-14: 40 mg via INTRAVENOUS

## 2015-01-14 MED ORDER — SODIUM CHLORIDE 0.9 % IJ SOLN
OROMUCOSAL | Status: DC | PRN
  Administered 2015-01-14 (×3): 4 mL via TOPICAL

## 2015-01-14 MED ORDER — ROCURONIUM BROMIDE 50 MG/5ML IV SOLN
INTRAVENOUS | Status: AC
  Filled 2015-01-14: qty 1

## 2015-01-14 MED ORDER — SODIUM CHLORIDE 0.45 % IV SOLN: INTRAVENOUS | Status: DC | PRN

## 2015-01-14 MED ORDER — ALBUMIN HUMAN 5 % IV SOLN
INTRAVENOUS | Status: DC | PRN
  Administered 2015-01-14: 12:00:00 via INTRAVENOUS

## 2015-01-14 MED ORDER — EPHEDRINE SULFATE 50 MG/ML IJ SOLN
INTRAMUSCULAR | Status: AC
  Filled 2015-01-14: qty 1

## 2015-01-14 MED ORDER — SODIUM CHLORIDE 0.9 % IV SOLN
INTRAVENOUS | Status: DC
  Administered 2015-01-14: 14:00:00 via INTRAVENOUS

## 2015-01-14 MED ORDER — ASPIRIN EC 325 MG PO TBEC
325.0000 mg | DELAYED_RELEASE_TABLET | Freq: Every day | ORAL | Status: DC
  Administered 2015-01-15 – 2015-01-18 (×4): 325 mg via ORAL
  Filled 2015-01-14 (×4): qty 1

## 2015-01-14 MED ORDER — HEPARIN SODIUM (PORCINE) 1000 UNIT/ML IJ SOLN
INTRAMUSCULAR | Status: AC
  Filled 2015-01-14: qty 1

## 2015-01-14 MED ORDER — ACETAMINOPHEN 500 MG PO TABS
1000.0000 mg | ORAL_TABLET | Freq: Four times a day (QID) | ORAL | Status: AC
  Administered 2015-01-15 – 2015-01-19 (×17): 1000 mg via ORAL
  Filled 2015-01-14 (×18): qty 2

## 2015-01-14 MED ORDER — METOPROLOL TARTRATE 12.5 MG HALF TABLET
12.5000 mg | ORAL_TABLET | Freq: Two times a day (BID) | ORAL | Status: DC
  Administered 2015-01-15 – 2015-01-18 (×6): 12.5 mg via ORAL
  Filled 2015-01-14 (×9): qty 1

## 2015-01-14 MED ORDER — SODIUM CHLORIDE 0.9 % IV SOLN
INTRAVENOUS | Status: DC
  Administered 2015-01-14: 4.8 [IU]/h via INTRAVENOUS
  Administered 2015-01-14: 4.5 [IU]/h via INTRAVENOUS
  Filled 2015-01-14: qty 2.5

## 2015-01-14 MED ORDER — METOPROLOL TARTRATE 25 MG/10 ML ORAL SUSPENSION
12.5000 mg | Freq: Two times a day (BID) | ORAL | Status: DC
  Filled 2015-01-14 (×9): qty 5

## 2015-01-14 MED ORDER — MORPHINE SULFATE 2 MG/ML IJ SOLN
2.0000 mg | INTRAMUSCULAR | Status: DC | PRN
  Administered 2015-01-14 – 2015-01-15 (×3): 2 mg via INTRAVENOUS
  Filled 2015-01-14 (×3): qty 1

## 2015-01-14 MED ORDER — LIDOCAINE HCL (CARDIAC) 20 MG/ML IV SOLN
INTRAVENOUS | Status: AC
  Filled 2015-01-14: qty 5

## 2015-01-14 MED ORDER — METOPROLOL TARTRATE 1 MG/ML IV SOLN: 2.5000 mg | INTRAVENOUS | Status: DC | PRN

## 2015-01-14 MED ORDER — SODIUM CHLORIDE 0.9 % IV SOLN
1500.0000 mg | Freq: Two times a day (BID) | INTRAVENOUS | Status: AC
  Administered 2015-01-14 – 2015-01-15 (×3): 1500 mg via INTRAVENOUS
  Filled 2015-01-14 (×4): qty 1500

## 2015-01-14 MED ORDER — FENTANYL CITRATE (PF) 100 MCG/2ML IJ SOLN
INTRAMUSCULAR | Status: DC | PRN
  Administered 2015-01-14 (×2): 250 ug via INTRAVENOUS
  Administered 2015-01-14: 100 ug via INTRAVENOUS
  Administered 2015-01-14: 250 ug via INTRAVENOUS
  Administered 2015-01-14: 50 ug via INTRAVENOUS
  Administered 2015-01-14: 200 ug via INTRAVENOUS
  Administered 2015-01-14: 150 ug via INTRAVENOUS
  Administered 2015-01-14: 200 ug via INTRAVENOUS
  Administered 2015-01-14: 150 ug via INTRAVENOUS
  Administered 2015-01-14: 50 ug via INTRAVENOUS
  Administered 2015-01-14: 100 ug via INTRAVENOUS
  Administered 2015-01-14: 250 ug via INTRAVENOUS

## 2015-01-14 MED ORDER — CHLORHEXIDINE GLUCONATE 0.12 % MT SOLN
15.0000 mL | Freq: Two times a day (BID) | OROMUCOSAL | Status: DC
  Administered 2015-01-14: 15 mL via OROMUCOSAL
  Filled 2015-01-14: qty 15

## 2015-01-14 MED ORDER — CEFUROXIME SODIUM 1.5 G IJ SOLR
1.5000 g | Freq: Two times a day (BID) | INTRAMUSCULAR | Status: AC
  Administered 2015-01-14 – 2015-01-16 (×4): 1.5 g via INTRAVENOUS
  Filled 2015-01-14 (×4): qty 1.5

## 2015-01-14 MED ORDER — MIDAZOLAM HCL 2 MG/2ML IJ SOLN: 2.0000 mg | INTRAMUSCULAR | Status: DC | PRN

## 2015-01-14 MED ORDER — VANCOMYCIN HCL IN DEXTROSE 1-5 GM/200ML-% IV SOLN
1000.0000 mg | Freq: Two times a day (BID) | INTRAVENOUS | Status: DC
  Filled 2015-01-14 (×2): qty 200

## 2015-01-14 MED ORDER — DOCUSATE SODIUM 100 MG PO CAPS
200.0000 mg | ORAL_CAPSULE | Freq: Every day | ORAL | Status: DC
  Administered 2015-01-15 – 2015-01-23 (×9): 200 mg via ORAL
  Filled 2015-01-14 (×9): qty 2

## 2015-01-14 MED ORDER — SODIUM CHLORIDE 0.9 % IJ SOLN
INTRAMUSCULAR | Status: AC
  Filled 2015-01-14: qty 10

## 2015-01-14 MED ORDER — LACTATED RINGERS IV SOLN: 500.0000 mL | Freq: Once | INTRAVENOUS | Status: DC | PRN

## 2015-01-14 MED ORDER — CETYLPYRIDINIUM CHLORIDE 0.05 % MT LIQD
7.0000 mL | Freq: Four times a day (QID) | OROMUCOSAL | Status: DC
  Administered 2015-01-14 – 2015-01-15 (×3): 7 mL via OROMUCOSAL

## 2015-01-14 MED ORDER — HEPARIN SODIUM (PORCINE) 1000 UNIT/ML IJ SOLN
INTRAMUSCULAR | Status: DC | PRN
  Administered 2015-01-14: 5000 [IU] via INTRAVENOUS
  Administered 2015-01-14: 45000 [IU] via INTRAVENOUS

## 2015-01-14 MED ORDER — INSULIN REGULAR BOLUS VIA INFUSION
0.0000 [IU] | Freq: Three times a day (TID) | INTRAVENOUS | Status: DC
  Administered 2015-01-15: 6 [IU] via INTRAVENOUS
  Filled 2015-01-14: qty 10

## 2015-01-14 MED ORDER — MIDAZOLAM HCL 5 MG/5ML IJ SOLN
INTRAMUSCULAR | Status: DC | PRN
Start: 2015-01-14 — End: 2015-01-14
  Administered 2015-01-14: 2 mg via INTRAVENOUS
  Administered 2015-01-14: 1 mg via INTRAVENOUS
  Administered 2015-01-14: 2 mg via INTRAVENOUS
  Administered 2015-01-14: 5 mg via INTRAVENOUS

## 2015-01-14 MED ORDER — ARTIFICIAL TEARS OP OINT
TOPICAL_OINTMENT | OPHTHALMIC | Status: DC | PRN
  Administered 2015-01-14: 1 via OPHTHALMIC

## 2015-01-14 MED ORDER — LACTATED RINGERS IV SOLN
INTRAVENOUS | Status: DC | PRN
  Administered 2015-01-14: 08:00:00 via INTRAVENOUS

## 2015-01-14 MED ORDER — ROCURONIUM BROMIDE 50 MG/5ML IV SOLN
INTRAVENOUS | Status: AC
  Filled 2015-01-14: qty 2

## 2015-01-14 MED ORDER — PROPOFOL 10 MG/ML IV BOLUS
INTRAVENOUS | Status: DC | PRN
  Administered 2015-01-14: 70 mg via INTRAVENOUS

## 2015-01-14 MED ORDER — SODIUM CHLORIDE 0.9 % IJ SOLN: 3.0000 mL | INTRAMUSCULAR | Status: DC | PRN

## 2015-01-14 MED ORDER — FENTANYL CITRATE (PF) 250 MCG/5ML IJ SOLN
INTRAMUSCULAR | Status: AC
  Filled 2015-01-14: qty 5

## 2015-01-14 MED ORDER — NITROGLYCERIN IN D5W 200-5 MCG/ML-% IV SOLN
0.0000 ug/min | INTRAVENOUS | Status: DC
  Administered 2015-01-14: 0 ug/min via INTRAVENOUS

## 2015-01-14 MED ORDER — SODIUM CHLORIDE 0.9 % IJ SOLN
3.0000 mL | Freq: Two times a day (BID) | INTRAMUSCULAR | Status: DC
  Administered 2015-01-15 – 2015-01-23 (×11): 3 mL via INTRAVENOUS

## 2015-01-14 MED ORDER — SODIUM BICARBONATE 8.4 % IV SOLN
50.0000 meq | Freq: Once | INTRAVENOUS | Status: AC
  Administered 2015-01-14: 50 meq via INTRAVENOUS

## 2015-01-14 MED ORDER — ACETAMINOPHEN 160 MG/5ML PO SOLN: 1000.0000 mg | Freq: Four times a day (QID) | ORAL | Status: DC

## 2015-01-14 MED ORDER — BISACODYL 10 MG RE SUPP
10.0000 mg | Freq: Every day | RECTAL | Status: DC
Start: 2015-01-15 — End: 2015-01-23

## 2015-01-14 MED ORDER — FENTANYL CITRATE (PF) 250 MCG/5ML IJ SOLN
INTRAMUSCULAR | Status: AC
Start: 2015-01-14 — End: 2015-01-14
  Filled 2015-01-14: qty 5

## 2015-01-14 MED ORDER — POTASSIUM CHLORIDE 10 MEQ/50ML IV SOLN
10.0000 meq | Freq: Once | INTRAVENOUS | Status: AC
  Administered 2015-01-14: 10 meq via INTRAVENOUS

## 2015-01-14 MED ORDER — MAGNESIUM SULFATE 4 GM/100ML IV SOLN
4.0000 g | Freq: Once | INTRAVENOUS | Status: AC
  Administered 2015-01-14: 4 g via INTRAVENOUS
  Filled 2015-01-14: qty 100

## 2015-01-14 MED ORDER — ACETAMINOPHEN 160 MG/5ML PO SOLN: 650.0000 mg | Freq: Once | ORAL | Status: AC

## 2015-01-14 MED ORDER — DEXTROSE 5 % IV SOLN
0.0000 ug/min | INTRAVENOUS | Status: DC
  Administered 2015-01-14 (×2): 0 ug/min via INTRAVENOUS
  Filled 2015-01-14: qty 2

## 2015-01-14 MED ORDER — TRAMADOL HCL 50 MG PO TABS
50.0000 mg | ORAL_TABLET | ORAL | Status: DC | PRN
  Administered 2015-01-15 – 2015-01-18 (×3): 100 mg via ORAL
  Filled 2015-01-14 (×3): qty 2

## 2015-01-14 MED ORDER — SUCCINYLCHOLINE CHLORIDE 20 MG/ML IJ SOLN
INTRAMUSCULAR | Status: AC
  Filled 2015-01-14: qty 1

## 2015-01-14 MED ORDER — POTASSIUM CHLORIDE 10 MEQ/50ML IV SOLN
10.0000 meq | INTRAVENOUS | Status: AC
  Administered 2015-01-14 (×3): 10 meq via INTRAVENOUS

## 2015-01-14 MED ORDER — PROTAMINE SULFATE 10 MG/ML IV SOLN
INTRAVENOUS | Status: AC
  Filled 2015-01-14: qty 25

## 2015-01-14 MED ORDER — 0.9 % SODIUM CHLORIDE (POUR BTL) OPTIME
TOPICAL | Status: DC | PRN
  Administered 2015-01-14: 6000 mL

## 2015-01-14 MED ORDER — ONDANSETRON HCL 4 MG/2ML IJ SOLN
4.0000 mg | Freq: Four times a day (QID) | INTRAMUSCULAR | Status: DC | PRN
  Administered 2015-01-16: 4 mg via INTRAVENOUS
  Filled 2015-01-14: qty 2

## 2015-01-14 MED ORDER — SODIUM CHLORIDE 0.9 % IV SOLN: 250.0000 mL | INTRAVENOUS | Status: DC

## 2015-01-14 MED ORDER — METOCLOPRAMIDE HCL 5 MG/ML IJ SOLN
10.0000 mg | Freq: Four times a day (QID) | INTRAMUSCULAR | Status: AC
  Administered 2015-01-14 – 2015-01-16 (×9): 10 mg via INTRAVENOUS
  Filled 2015-01-14 (×9): qty 2

## 2015-01-14 MED ORDER — DOPAMINE-DEXTROSE 3.2-5 MG/ML-% IV SOLN
INTRAVENOUS | Status: DC | PRN
  Administered 2015-01-14: 3 ug/kg/min via INTRAVENOUS

## 2015-01-14 MED ORDER — OXYCODONE HCL 5 MG PO TABS
5.0000 mg | ORAL_TABLET | ORAL | Status: DC | PRN
  Administered 2015-01-15 – 2015-01-16 (×3): 10 mg via ORAL
  Administered 2015-01-17 – 2015-01-18 (×3): 5 mg via ORAL
  Administered 2015-01-18 – 2015-01-23 (×15): 10 mg via ORAL
  Filled 2015-01-14 (×11): qty 2
  Filled 2015-01-14: qty 1
  Filled 2015-01-14: qty 2
  Filled 2015-01-14: qty 1
  Filled 2015-01-14 (×6): qty 2
  Filled 2015-01-14: qty 1

## 2015-01-14 MED ORDER — BISACODYL 5 MG PO TBEC
10.0000 mg | DELAYED_RELEASE_TABLET | Freq: Every day | ORAL | Status: DC
  Administered 2015-01-15 – 2015-01-19 (×5): 10 mg via ORAL
  Filled 2015-01-14 (×6): qty 2

## 2015-01-14 MED ORDER — PANTOPRAZOLE SODIUM 40 MG PO TBEC
40.0000 mg | DELAYED_RELEASE_TABLET | Freq: Every day | ORAL | Status: DC
Start: 2015-01-16 — End: 2015-01-23
  Administered 2015-01-16 – 2015-01-23 (×8): 40 mg via ORAL
  Filled 2015-01-14 (×8): qty 1

## 2015-01-14 MED ORDER — HEMOSTATIC AGENTS (NO CHARGE) OPTIME
TOPICAL | Status: DC | PRN
  Administered 2015-01-14 (×2): 1 via TOPICAL

## 2015-01-14 MED ORDER — MIDAZOLAM HCL 10 MG/2ML IJ SOLN
INTRAMUSCULAR | Status: AC
  Filled 2015-01-14: qty 2

## 2015-01-14 MED ORDER — ACETAMINOPHEN 650 MG RE SUPP
650.0000 mg | Freq: Once | RECTAL | Status: AC
  Administered 2015-01-14: 650 mg via RECTAL

## 2015-01-14 MED ORDER — PROTAMINE SULFATE 10 MG/ML IV SOLN
INTRAVENOUS | Status: DC | PRN
  Administered 2015-01-14: 450 mg via INTRAVENOUS

## 2015-01-14 MED ORDER — FAMOTIDINE IN NACL 20-0.9 MG/50ML-% IV SOLN
20.0000 mg | Freq: Two times a day (BID) | INTRAVENOUS | Status: AC
  Administered 2015-01-14 (×2): 20 mg via INTRAVENOUS
  Filled 2015-01-14: qty 50

## 2015-01-14 MED ORDER — HEPARIN SODIUM (PORCINE) 1000 UNIT/ML IJ SOLN
INTRAMUSCULAR | Status: AC
  Filled 2015-01-14: qty 2

## 2015-01-14 MED ORDER — MORPHINE SULFATE 2 MG/ML IJ SOLN: 1.0000 mg | INTRAMUSCULAR | Status: AC | PRN

## 2015-01-14 MED ORDER — VANCOMYCIN HCL IN DEXTROSE 1-5 GM/200ML-% IV SOLN
1000.0000 mg | Freq: Once | INTRAVENOUS | Status: DC
  Filled 2015-01-14: qty 200

## 2015-01-14 MED ORDER — ALBUMIN HUMAN 5 % IV SOLN
250.0000 mL | INTRAVENOUS | Status: AC | PRN
  Administered 2015-01-14: 250 mL via INTRAVENOUS

## 2015-01-14 MED ORDER — ASPIRIN 81 MG PO CHEW: 324.0000 mg | CHEWABLE_TABLET | Freq: Every day | ORAL | Status: DC

## 2015-01-14 MED ORDER — ROCURONIUM BROMIDE 100 MG/10ML IV SOLN
INTRAVENOUS | Status: DC | PRN
  Administered 2015-01-14: 50 mg via INTRAVENOUS
  Administered 2015-01-14: 100 mg via INTRAVENOUS
  Administered 2015-01-14 (×2): 50 mg via INTRAVENOUS

## 2015-01-14 MED FILL — Sodium Chloride IV Soln 0.9%: INTRAVENOUS | Qty: 2000 | Status: AC

## 2015-01-14 MED FILL — Heparin Sodium (Porcine) Inj 1000 Unit/ML: INTRAMUSCULAR | Qty: 10 | Status: AC

## 2015-01-14 MED FILL — Sodium Bicarbonate IV Soln 8.4%: INTRAVENOUS | Qty: 50 | Status: AC

## 2015-01-14 MED FILL — Mannitol IV Soln 20%: INTRAVENOUS | Qty: 500 | Status: AC

## 2015-01-14 MED FILL — Electrolyte-R (PH 7.4) Solution: INTRAVENOUS | Qty: 4000 | Status: AC

## 2015-01-14 MED FILL — Lidocaine HCl IV Inj 20 MG/ML: INTRAVENOUS | Qty: 10 | Status: AC

## 2015-01-14 SURGICAL SUPPLY — 104 items
ADAPTER CARDIO PERF ANTE/RETRO (ADAPTER) ×4 IMPLANT
BAG DECANTER FOR FLEXI CONT (MISCELLANEOUS) ×4 IMPLANT
BANDAGE ELASTIC 4 VELCRO ST LF (GAUZE/BANDAGES/DRESSINGS) ×4 IMPLANT
BANDAGE ELASTIC 6 VELCRO ST LF (GAUZE/BANDAGES/DRESSINGS) ×4 IMPLANT
BASKET HEART  (ORDER IN 25'S) (MISCELLANEOUS) ×1
BASKET HEART (ORDER IN 25'S) (MISCELLANEOUS) ×1
BASKET HEART (ORDER IN 25S) (MISCELLANEOUS) ×2 IMPLANT
BENZOIN TINCTURE PRP APPL 2/3 (GAUZE/BANDAGES/DRESSINGS) ×4 IMPLANT
BLADE STERNUM SYSTEM 6 (BLADE) ×4 IMPLANT
BLADE SURG 11 STRL SS (BLADE) ×4 IMPLANT
BLADE SURG 12 STRL SS (BLADE) ×4 IMPLANT
BLADE SURG ROTATE 9660 (MISCELLANEOUS) IMPLANT
BNDG GAUZE ELAST 4 BULKY (GAUZE/BANDAGES/DRESSINGS) ×4 IMPLANT
CANISTER SUCTION 2500CC (MISCELLANEOUS) ×4 IMPLANT
CANNULA ARTERIAL NVNT 3/8 22FR (MISCELLANEOUS) ×4 IMPLANT
CANNULA GUNDRY RCSP 15FR (MISCELLANEOUS) ×4 IMPLANT
CATH CPB KIT VANTRIGT (MISCELLANEOUS) ×4 IMPLANT
CATH ROBINSON RED A/P 18FR (CATHETERS) ×12 IMPLANT
CATH THORACIC 36FR RT ANG (CATHETERS) ×4 IMPLANT
CLIP TI WIDE RED SMALL 24 (CLIP) ×4 IMPLANT
CLOSURE WOUND 1/2 X4 (GAUZE/BANDAGES/DRESSINGS) ×1
COVER SURGICAL LIGHT HANDLE (MISCELLANEOUS) ×4 IMPLANT
CRADLE DONUT ADULT HEAD (MISCELLANEOUS) ×4 IMPLANT
DRAIN CHANNEL 32F RND 10.7 FF (WOUND CARE) ×4 IMPLANT
DRAPE CARDIOVASCULAR INCISE (DRAPES) ×2
DRAPE SLUSH/WARMER DISC (DRAPES) ×4 IMPLANT
DRAPE SRG 135X102X78XABS (DRAPES) ×2 IMPLANT
DRSG AQUACEL AG ADV 3.5X14 (GAUZE/BANDAGES/DRESSINGS) ×4 IMPLANT
ELECT BLADE 4.0 EZ CLEAN MEGAD (MISCELLANEOUS) ×4
ELECT BLADE 6.5 EXT (BLADE) ×4 IMPLANT
ELECT CAUTERY BLADE 6.4 (BLADE) ×4 IMPLANT
ELECT REM PT RETURN 9FT ADLT (ELECTROSURGICAL) ×8
ELECTRODE BLDE 4.0 EZ CLN MEGD (MISCELLANEOUS) ×2 IMPLANT
ELECTRODE REM PT RTRN 9FT ADLT (ELECTROSURGICAL) ×4 IMPLANT
GAUZE SPONGE 4X4 12PLY STRL (GAUZE/BANDAGES/DRESSINGS) ×8 IMPLANT
GLOVE BIO SURGEON STRL SZ 6 (GLOVE) ×16 IMPLANT
GLOVE BIO SURGEON STRL SZ 6.5 (GLOVE) ×6 IMPLANT
GLOVE BIO SURGEON STRL SZ7.5 (GLOVE) ×12 IMPLANT
GLOVE BIO SURGEONS STRL SZ 6.5 (GLOVE) ×2
GLOVE BIOGEL PI IND STRL 6 (GLOVE) ×2 IMPLANT
GLOVE BIOGEL PI IND STRL 7.5 (GLOVE) ×4 IMPLANT
GLOVE BIOGEL PI INDICATOR 6 (GLOVE) ×2
GLOVE BIOGEL PI INDICATOR 7.5 (GLOVE) ×4
GOWN STRL REUS W/ TWL LRG LVL3 (GOWN DISPOSABLE) ×16 IMPLANT
GOWN STRL REUS W/TWL LRG LVL3 (GOWN DISPOSABLE) ×16
HEMOSTAT POWDER SURGIFOAM 1G (HEMOSTASIS) ×12 IMPLANT
HEMOSTAT SURGICEL 2X14 (HEMOSTASIS) ×4 IMPLANT
INSERT FOGARTY XLG (MISCELLANEOUS) ×4 IMPLANT
KIT BASIN OR (CUSTOM PROCEDURE TRAY) ×4 IMPLANT
KIT ROOM TURNOVER OR (KITS) ×4 IMPLANT
KIT SUCTION CATH 14FR (SUCTIONS) ×4 IMPLANT
KIT VASOVIEW W/TROCAR VH 2000 (KITS) ×4 IMPLANT
LEAD PACING MYOCARDI (MISCELLANEOUS) ×8 IMPLANT
MARKER GRAFT CORONARY BYPASS (MISCELLANEOUS) ×12 IMPLANT
NS IRRIG 1000ML POUR BTL (IV SOLUTION) ×20 IMPLANT
PACK OPEN HEART (CUSTOM PROCEDURE TRAY) ×4 IMPLANT
PAD ARMBOARD 7.5X6 YLW CONV (MISCELLANEOUS) ×8 IMPLANT
PAD ELECT DEFIB RADIOL ZOLL (MISCELLANEOUS) ×4 IMPLANT
PENCIL BUTTON HOLSTER BLD 10FT (ELECTRODE) ×4 IMPLANT
PUNCH AORTIC ROTATE  4.5MM 8IN (MISCELLANEOUS) ×4 IMPLANT
PUNCH AORTIC ROTATE 4.0MM (MISCELLANEOUS) IMPLANT
PUNCH AORTIC ROTATE 4.5MM 8IN (MISCELLANEOUS) IMPLANT
PUNCH AORTIC ROTATE 5MM 8IN (MISCELLANEOUS) IMPLANT
SET CARDIOPLEGIA MPS 5001102 (MISCELLANEOUS) ×4 IMPLANT
SPONGE LAP 18X18 X RAY DECT (DISPOSABLE) ×4 IMPLANT
SPONGE LAP 4X18 X RAY DECT (DISPOSABLE) ×4 IMPLANT
STRIP CLOSURE SKIN 1/2X4 (GAUZE/BANDAGES/DRESSINGS) ×3 IMPLANT
SURGIFLO W/THROMBIN 8M KIT (HEMOSTASIS) ×8 IMPLANT
SUT BONE WAX W31G (SUTURE) ×4 IMPLANT
SUT MNCRL AB 4-0 PS2 18 (SUTURE) ×4 IMPLANT
SUT PROLENE 3 0 SH DA (SUTURE) IMPLANT
SUT PROLENE 3 0 SH1 36 (SUTURE) ×4 IMPLANT
SUT PROLENE 4 0 RB 1 (SUTURE) ×2
SUT PROLENE 4 0 SH DA (SUTURE) ×4 IMPLANT
SUT PROLENE 4-0 RB1 .5 CRCL 36 (SUTURE) ×2 IMPLANT
SUT PROLENE 5 0 C 1 36 (SUTURE) IMPLANT
SUT PROLENE 6 0 C 1 30 (SUTURE) ×4 IMPLANT
SUT PROLENE 6 0 CC (SUTURE) ×12 IMPLANT
SUT PROLENE 8 0 BV175 6 (SUTURE) ×12 IMPLANT
SUT PROLENE BLUE 7 0 (SUTURE) ×8 IMPLANT
SUT SILK  1 MH (SUTURE)
SUT SILK 1 MH (SUTURE) IMPLANT
SUT SILK 2 0 SH CR/8 (SUTURE) ×4 IMPLANT
SUT SILK 3 0 SH CR/8 (SUTURE) IMPLANT
SUT STEEL 6MS V (SUTURE) ×8 IMPLANT
SUT STEEL SZ 6 DBL 3X14 BALL (SUTURE) ×4 IMPLANT
SUT VIC AB 1 CTX 18 (SUTURE) ×4 IMPLANT
SUT VIC AB 1 CTX 36 (SUTURE) ×4
SUT VIC AB 1 CTX36XBRD ANBCTR (SUTURE) ×4 IMPLANT
SUT VIC AB 2-0 CT1 27 (SUTURE) ×2
SUT VIC AB 2-0 CT1 TAPERPNT 27 (SUTURE) ×2 IMPLANT
SUT VIC AB 2-0 CTX 27 (SUTURE) IMPLANT
SUT VIC AB 3-0 X1 27 (SUTURE) IMPLANT
SUTURE E-PAK OPEN HEART (SUTURE) ×4 IMPLANT
SYR 5ML LL (SYRINGE) ×4 IMPLANT
SYSTEM SAHARA CHEST DRAIN ATS (WOUND CARE) ×4 IMPLANT
TAPE CLOTH SURG 4X10 WHT LF (GAUZE/BANDAGES/DRESSINGS) ×4 IMPLANT
TOWEL OR 17X24 6PK STRL BLUE (TOWEL DISPOSABLE) ×8 IMPLANT
TOWEL OR 17X26 10 PK STRL BLUE (TOWEL DISPOSABLE) ×8 IMPLANT
TRAY FOLEY IC TEMP SENS 16FR (CATHETERS) ×4 IMPLANT
TUBING INSUFFLATION (TUBING) ×4 IMPLANT
UNDERPAD 30X30 INCONTINENT (UNDERPADS AND DIAPERS) ×4 IMPLANT
WATER STERILE IRR 1000ML POUR (IV SOLUTION) ×8 IMPLANT
YANKAUER SUCT BULB TIP NO VENT (SUCTIONS) ×4 IMPLANT

## 2015-01-14 NOTE — Anesthesia Preprocedure Evaluation (Addendum)
Anesthesia Evaluation  Patient identified by MRN, date of birth, ID band Patient awake    Reviewed: Allergy & Precautions, NPO status , Patient's Chart, lab work & pertinent test results  Airway Mallampati: II  TM Distance: >3 FB     Dental  (+) Partial Lower, Partial Upper   Pulmonary    Pulmonary exam normal + decreased breath sounds      Cardiovascular hypertension, Pt. on home beta blockers + angina + CAD Rhythm:Regular Rate:Normal  LVEF  60-65%   Neuro/Psych    GI/Hepatic negative GI ROS, Neg liver ROS,   Endo/Other  diabetes, Type 2  Renal/GU Creatinine .68     Musculoskeletal   Abdominal (+) + obese,   Peds  Hematology negative hematology ROS (+)   Anesthesia Other Findings   Reproductive/Obstetrics                            Anesthesia Physical Anesthesia Plan  ASA: III  Anesthesia Plan: General   Post-op Pain Management:    Induction: Intravenous  Airway Management Planned: Mask and Oral ETT  Additional Equipment: Arterial line, CVP, PA Cath, TEE, 3D TEE and Ultrasound Guidance Line Placement  Intra-op Plan:   Post-operative Plan: Post-operative intubation/ventilation  Informed Consent: I have reviewed the patients History and Physical, chart, labs and discussed the procedure including the risks, benefits and alternatives for the proposed anesthesia with the patient or authorized representative who has indicated his/her understanding and acceptance.   Dental advisory given  Plan Discussed with: CRNA, Anesthesiologist and Surgeon  Anesthesia Plan Comments:         Anesthesia Quick Evaluation

## 2015-01-14 NOTE — Progress Notes (Signed)
CT surgery p.m. Rounds  Status post CABG 3 Extubated, stable hemodynamics Chest tube output satisfactory Postop labs reviewed

## 2015-01-14 NOTE — Progress Notes (Signed)
The patient was examined and preop studies reviewed. There has been no change from the prior exam and the patient is ready for surgery. Plan CABG on D B today

## 2015-01-14 NOTE — Progress Notes (Signed)
Rapid wean protocol started at this time.  Fio2 dec to 40%, RR dec to 4, pt tolerating well, will monitor

## 2015-01-14 NOTE — Brief Op Note (Signed)
01/09/2015 - 01/14/2015  11:35 AM  PATIENT:  Harrington Challengeravid L Pepin  59 y.o. male  PRE-OPERATIVE DIAGNOSIS:  CAD  POST-OPERATIVE DIAGNOSIS:  CAD  PROCEDURE:   CORONARY ARTERY BYPASS GRAFTING x 3 (LIMA-LAD, SVG-OM, SVG-Ramus)  ENDOSCOPIC RIGHT GREATER SAPHENOUS VEIN HARVEST   SURGEON:  Kerin PernaPeter Van Trigt, MD  ASSISTANT:  Coral CeoGina Devone Tousley, PA-C  ANESTHESIA:   general  PATIENT CONDITION:  ICU - intubated and hemodynamically stable.  PRE-OPERATIVE WEIGHT: 144 kg

## 2015-01-14 NOTE — Transfer of Care (Signed)
Immediate Anesthesia Transfer of Care Note  Patient: Craig Harvey  Procedure(s) Performed: Procedure(s): CORONARY ARTERY BYPASS GRAFTING (CABG), ON PUMP, TIMES THREE, USING LEFT INTERNAL MAMMARY ARTERY, RIGHT GREATER SAPHENOUS VEIN HARVESTED ENDOSCOPICALLY (N/A) TRANSESOPHAGEAL ECHOCARDIOGRAM (TEE) (N/A)  Patient Location: ICU  Anesthesia Type:General  Level of Consciousness: Patient remains intubated per anesthesia plan  Airway & Oxygen Therapy: Patient remains intubated per anesthesia plan and Patient placed on Ventilator (see vital sign flow sheet for setting)  Post-op Assessment: Report given to RN and Post -op Vital signs reviewed and stable  Post vital signs: Reviewed and stable  Last Vitals:  Filed Vitals:   01/14/15 1343  BP: 102/58  Pulse: 80  Temp:   Resp: 12    Complications: No apparent anesthesia complications

## 2015-01-14 NOTE — Procedures (Signed)
Extubation Procedure Note  Patient Details:   Name: Craig Harvey DOB: 1955/12/28 MRN: [id number]   Airway Documentation:     Evaluation  O2 sats: stable throughout Complications: No apparent complications Patient did tolerate procedure well. Bilateral Breath Sounds: Clear, Diminished   Yes   Pt extubated per rapid wean protocol at this time.  Pt placed on 4L [place] tolerating well.  NIF -30, VC 1L  Cherylin MylarDoyle, Craig Harvey 01/14/2015, 06:08PM

## 2015-01-14 NOTE — Anesthesia Procedure Notes (Signed)
Procedure Name: Intubation Date/Time: 01/14/2015 7:45 AM Performed by: Coralee RudFLORES, Tex Conroy Pre-anesthesia Checklist: Patient identified, Emergency Drugs available, Suction available and Patient being monitored Patient Re-evaluated:Patient Re-evaluated prior to inductionOxygen Delivery Method: Circle system utilized Preoxygenation: Pre-oxygenation with 100% oxygen Intubation Type: IV induction Ventilation: Mask ventilation without difficulty and Oral airway inserted - appropriate to patient size Laryngoscope Size: Glidescope (Elective Glidescope) Grade View: Grade I Tube type: Oral Tube size: 8.0 mm Number of attempts: 1 Airway Equipment and Method: Stylet and Video-laryngoscopy Placement Confirmation: ETT inserted through vocal cords under direct vision,  positive ETCO2 and breath sounds checked- equal and bilateral Secured at: 23 cm Tube secured with: Tape Dental Injury: Teeth and Oropharynx as per pre-operative assessment

## 2015-01-14 NOTE — Progress Notes (Signed)
Echocardiogram Echocardiogram Transesophageal has been performed.  Craig Harvey, [name] M 01/14/2015, 8:28 AM

## 2015-01-14 NOTE — OR Nursing (Signed)
40 minute call to SICU charge nurse at 1216.

## 2015-01-14 NOTE — Progress Notes (Signed)
Placed patient on CPAP for the night. Patient tolerating well.  

## 2015-01-14 NOTE — Anesthesia Postprocedure Evaluation (Signed)
  Anesthesia Post-op Note  Patient: Craig Harvey  Procedure(s) Performed: Procedure(s): CORONARY ARTERY BYPASS GRAFTING (CABG), ON PUMP, TIMES THREE, USING LEFT INTERNAL MAMMARY ARTERY, RIGHT GREATER SAPHENOUS VEIN HARVESTED ENDOSCOPICALLY (N/A) TRANSESOPHAGEAL ECHOCARDIOGRAM (TEE) (N/A)  Patient Location: SICU  Anesthesia Type:General  Level of Consciousness: Patient remains intubated per anesthesia plan  Airway and Oxygen Therapy: Patient remains intubated per anesthesia plan and Patient placed on Ventilator (see vital sign flow sheet for setting)  Post-op Pain: none  Post-op Assessment: Post-op Vital signs reviewed, Patient's Cardiovascular Status Stable, Respiratory Function Stable and Patent Airway              Post-op Vital Signs: Reviewed and stable  Last Vitals:  Filed Vitals:   01/14/15 1343  BP: 102/58  Pulse: 80  Temp:   Resp: 12    Complications: No apparent anesthesia complications

## 2015-01-14 NOTE — OR Nursing (Signed)
Off pump call to SICU charge nurse at 1205.

## 2015-01-15 ENCOUNTER — Encounter (HOSPITAL_COMMUNITY): Payer: Self-pay | Admitting: Cardiothoracic Surgery

## 2015-01-15 ENCOUNTER — Inpatient Hospital Stay (HOSPITAL_COMMUNITY): Payer: Medicaid Other

## 2015-01-15 LAB — POCT I-STAT, CHEM 8
BUN: 11 mg/dL (ref 6–20)
CALCIUM ION: 1.17 mmol/L (ref 1.12–1.23)
CHLORIDE: 98 mmol/L — AB (ref 101–111)
Creatinine, Ser: 0.9 mg/dL (ref 0.61–1.24)
Glucose, Bld: 244 mg/dL — ABNORMAL HIGH (ref 65–99)
HEMATOCRIT: 41 % (ref 39.0–52.0)
Hemoglobin: 13.9 g/dL (ref 13.0–17.0)
POTASSIUM: 4.7 mmol/L (ref 3.5–5.1)
Sodium: 132 mmol/L — ABNORMAL LOW (ref 135–145)
TCO2: 22 mmol/L (ref 0–100)

## 2015-01-15 LAB — CBC
HCT: 37.4 % — ABNORMAL LOW (ref 39.0–52.0)
HEMATOCRIT: 35.9 % — AB (ref 39.0–52.0)
HEMOGLOBIN: 12.2 g/dL — AB (ref 13.0–17.0)
Hemoglobin: 12.6 g/dL — ABNORMAL LOW (ref 13.0–17.0)
MCH: 29.2 pg (ref 26.0–34.0)
MCH: 28.9 pg (ref 26.0–34.0)
MCHC: 34 g/dL (ref 30.0–36.0)
MCHC: 33.7 g/dL (ref 30.0–36.0)
MCV: 85.9 fL (ref 78.0–100.0)
MCV: 85.8 fL (ref 78.0–100.0)
Platelets: 127 10*3/uL — ABNORMAL LOW (ref 150–400)
Platelets: 130 10*3/uL — ABNORMAL LOW (ref 150–400)
RBC: 4.18 MIL/uL — ABNORMAL LOW (ref 4.22–5.81)
RBC: 4.36 MIL/uL (ref 4.22–5.81)
RDW: 14.2 % (ref 11.5–15.5)
RDW: 14.5 % (ref 11.5–15.5)
WBC: 11.7 10*3/uL — ABNORMAL HIGH (ref 4.0–10.5)
WBC: 14 10*3/uL — AB (ref 4.0–10.5)

## 2015-01-15 LAB — TYPE AND SCREEN
ABO/RH(D): A POS
Antibody Screen: NEGATIVE
Unit division: 0
Unit division: 0

## 2015-01-15 LAB — MAGNESIUM
Magnesium: 2.4 mg/dL (ref 1.7–2.4)
Magnesium: 2.2 mg/dL (ref 1.7–2.4)

## 2015-01-15 LAB — CREATININE, SERUM
CREATININE: 1.06 mg/dL (ref 0.61–1.24)
GFR calc Af Amer: >60 mL/min (ref >60)
GFR calc non Af Amer: >60 mL/min (ref >60)

## 2015-01-15 LAB — GLUCOSE, CAPILLARY
GLUCOSE-CAPILLARY: 140 mg/dL — AB (ref 65–99)
GLUCOSE-CAPILLARY: 141 mg/dL — AB (ref 65–99)
GLUCOSE-CAPILLARY: 128 mg/dL — AB (ref 65–99)
GLUCOSE-CAPILLARY: 138 mg/dL — AB (ref 65–99)
GLUCOSE-CAPILLARY: 143 mg/dL — AB (ref 65–99)
GLUCOSE-CAPILLARY: 228 mg/dL — AB (ref 65–99)
Glucose-Capillary: 112 mg/dL — ABNORMAL HIGH (ref 65–99)
Glucose-Capillary: 127 mg/dL — ABNORMAL HIGH (ref 65–99)
Glucose-Capillary: 134 mg/dL — ABNORMAL HIGH (ref 65–99)
Glucose-Capillary: 119 mg/dL — ABNORMAL HIGH (ref 65–99)
Glucose-Capillary: 129 mg/dL — ABNORMAL HIGH (ref 65–99)
Glucose-Capillary: 135 mg/dL — ABNORMAL HIGH (ref 65–99)
Glucose-Capillary: 136 mg/dL — ABNORMAL HIGH (ref 65–99)
Glucose-Capillary: 137 mg/dL — ABNORMAL HIGH (ref 65–99)
Glucose-Capillary: 116 mg/dL — ABNORMAL HIGH (ref 65–99)
Glucose-Capillary: 123 mg/dL — ABNORMAL HIGH (ref 65–99)
Glucose-Capillary: 125 mg/dL — ABNORMAL HIGH (ref 65–99)
Glucose-Capillary: 129 mg/dL — ABNORMAL HIGH (ref 65–99)
Glucose-Capillary: 133 mg/dL — ABNORMAL HIGH (ref 65–99)
Glucose-Capillary: 134 mg/dL — ABNORMAL HIGH (ref 65–99)
Glucose-Capillary: 136 mg/dL — ABNORMAL HIGH (ref 65–99)
Glucose-Capillary: 138 mg/dL — ABNORMAL HIGH (ref 65–99)
Glucose-Capillary: 147 mg/dL — ABNORMAL HIGH (ref 65–99)
Glucose-Capillary: 198 mg/dL — ABNORMAL HIGH (ref 65–99)
Glucose-Capillary: 245 mg/dL — ABNORMAL HIGH (ref 65–99)

## 2015-01-15 LAB — BASIC METABOLIC PANEL
ANION GAP: 9 (ref 5–15)
BUN: 8 mg/dL (ref 6–20)
CALCIUM: 8 mg/dL — AB (ref 8.9–10.3)
CO2: 21 mmol/L — AB (ref 22–32)
CREATININE: 0.79 mg/dL (ref 0.61–1.24)
Chloride: 104 mmol/L (ref 101–111)
GFR calc Af Amer: >60 mL/min (ref >60)
GFR calc non Af Amer: >60 mL/min (ref >60)
Glucose, Bld: 137 mg/dL — ABNORMAL HIGH (ref 65–99)
POTASSIUM: 4.3 mmol/L (ref 3.5–5.1)
Sodium: 134 mmol/L — ABNORMAL LOW (ref 135–145)

## 2015-01-15 LAB — POCT I-STAT GLUCOSE
GLUCOSE: 158 mg/dL — AB (ref 65–99)
GLUCOSE: 199 mg/dL — AB (ref 65–99)
Operator id: 173792
Operator id: 173792

## 2015-01-15 MED ORDER — SODIUM CHLORIDE 0.45 % IV SOLN
INTRAVENOUS | Status: DC
  Administered 2015-01-15: 15:00:00 via INTRAVENOUS

## 2015-01-15 MED ORDER — INSULIN ASPART 100 UNIT/ML ~~LOC~~ SOLN: 0.0000 [IU] | SUBCUTANEOUS | Status: DC

## 2015-01-15 MED ORDER — CETYLPYRIDINIUM CHLORIDE 0.05 % MT LIQD
7.0000 mL | Freq: Two times a day (BID) | OROMUCOSAL | Status: DC
  Administered 2015-01-15 – 2015-01-20 (×9): 7 mL via OROMUCOSAL

## 2015-01-15 MED ORDER — INSULIN ASPART 100 UNIT/ML ~~LOC~~ SOLN
0.0000 [IU] | SUBCUTANEOUS | Status: DC
  Administered 2015-01-15: 4 [IU] via SUBCUTANEOUS
  Administered 2015-01-15 (×2): 8 [IU] via SUBCUTANEOUS

## 2015-01-15 MED ORDER — INSULIN DETEMIR 100 UNIT/ML ~~LOC~~ SOLN
24.0000 [IU] | Freq: Two times a day (BID) | SUBCUTANEOUS | Status: DC
  Administered 2015-01-15 – 2015-01-18 (×6): 24 [IU] via SUBCUTANEOUS
  Filled 2015-01-15 (×7): qty 0.24

## 2015-01-15 MED ORDER — FUROSEMIDE 10 MG/ML IJ SOLN
40.0000 mg | Freq: Two times a day (BID) | INTRAMUSCULAR | Status: DC
  Administered 2015-01-15 – 2015-01-16 (×3): 40 mg via INTRAVENOUS
  Filled 2015-01-15 (×5): qty 4

## 2015-01-15 MED ORDER — INSULIN DETEMIR 100 UNIT/ML ~~LOC~~ SOLN
18.0000 [IU] | Freq: Two times a day (BID) | SUBCUTANEOUS | Status: DC
Start: 2015-01-15 — End: 2015-01-15
  Administered 2015-01-15: 18 [IU] via SUBCUTANEOUS
  Filled 2015-01-15 (×2): qty 0.18

## 2015-01-15 MED ORDER — INSULIN DETEMIR 100 UNIT/ML ~~LOC~~ SOLN
18.0000 [IU] | Freq: Two times a day (BID) | SUBCUTANEOUS | Status: DC
  Filled 2015-01-15 (×2): qty 0.18

## 2015-01-15 MED ORDER — POTASSIUM CHLORIDE 10 MEQ/50ML IV SOLN
10.0000 meq | INTRAVENOUS | Status: AC
  Administered 2015-01-15 (×2): 10 meq via INTRAVENOUS
  Filled 2015-01-15: qty 50

## 2015-01-15 MED ORDER — INSULIN ASPART 100 UNIT/ML ~~LOC~~ SOLN
0.0000 [IU] | SUBCUTANEOUS | Status: DC
Start: 2015-01-15 — End: 2015-01-16
  Administered 2015-01-15: 8 [IU] via SUBCUTANEOUS
  Administered 2015-01-16: 4 [IU] via SUBCUTANEOUS

## 2015-01-15 MED FILL — Heparin Sodium (Porcine) Inj 1000 Unit/ML: INTRAMUSCULAR | Qty: 30 | Status: AC

## 2015-01-15 MED FILL — Magnesium Sulfate Inj 50%: INTRAMUSCULAR | Qty: 10 | Status: AC

## 2015-01-15 MED FILL — Potassium Chloride Inj 2 mEq/ML: INTRAVENOUS | Qty: 40 | Status: AC

## 2015-01-15 NOTE — Op Note (Signed)
NAME:  Craig Harvey, Craig Harvey NO.:  [id number]  MEDICAL RECORD NO.:  [id number]  LOCATION:  2S04C                        FACILITY:  [facility]  PHYSICIAN:  Kerin Perna, M.D.  DATE OF BIRTH:  Sep 11, 1955  DATE OF PROCEDURE:  01/14/2015 DATE OF DISCHARGE:                              OPERATIVE REPORT   OPERATIONS: 1. Coronary artery bypass grafting x3 (left internal mammary artery to     the distal left anterior descending, saphenous vein graft to     circumflex, saphenous vein graft to ramus intermediate). 2. Endoscopic harvest of right leg greater saphenous vein.  PREOPERATIVE DIAGNOSES:  Class 4 unstable angina, severe 2-vessel coronary artery disease.  POSTOPERATIVE DIAGNOSES:  Class 4 unstable angina, severe 2-vessel coronary artery disease.  SURGEON:  Kerin Perna, M.D.  ASSISTANT:  Coral Ceo, PA-C.  ANESTHESIA:  General by Guadalupe Maple, M.D.  INDICATIONS:  The patient is a 59 year old diabetic, obese 325-pound male, who presents with unstable angina.  He underwent cardiac catheterization at [facility] [place] which demonstrated a 95% LAD stenosis and an ostial 90% stenosis of the circumflex.  He was not felt to be a candidate for PCI and surgical revascularization was recommended.  He was transferred to this hospital for surgical evaluation therapy.  I reviewed the results of the cardiac catheterization and examined the patient after his transfer.  I discussed the indications and expected benefits of coronary artery bypass surgery for treatment of his severe coronary artery disease.  I reviewed with the patient major details of surgery including use of general anesthesia and cardiopulmonary bypass, the location of the surgical incisions, and the expected postoperative hospital recovery.  I reviewed with the patient the risks to him of coronary artery bypass grafting including risks of MI, stroke, bleeding, blood transfusion requirement, infection,  postoperative pulmonary problems including pleural effusion, and death.  After reviewing these issues, he demonstrated his understanding and agreed to proceed with surgery under what I felt was an informed consent.  OPERATIVE FINDINGS: 1. Adequate conduit. 2. Small poor quality circumflex vessel would not be candidate for     redo CABG. 3. No blood products required for this operation.  OPERATIVE PROCEDURE:  The patient was brought to the operating room, placed supine on the operating table.  General anesthesia was induced under invasive hemodynamic monitoring.  A transesophageal echo probe was placed by the anesthesiologist.  The patient was positioned and prepped and draped as a sterile field.  A proper time-out was performed.  A sternal incision was made as the saphenous vein was harvested endoscopically from the right leg.  The left internal mammary artery was harvested as a pedicle graft from its origin at the subclavian vessels. It was 1.4-mm vessel with adequate flow.  The sternal retractor was placed using the deep blades.  The pericardium was opened and suspended. Access to the heart was difficult due to the patient's obese body habitus.  Pursestrings were placed in the ascending aorta and right atrium and after the vein had been harvested, heparin was administered and the ACT was documented as being therapeutic.  The patient was cannulated through the pursestrings and placed on cardiopulmonary bypass.  The  coronaries were identified for grafting.  The circumflex anastomosis was placed high in the AV groove.  Cardioplegia cannulas were placed both antegrade and retrograde cold blood cardioplegia and the patient was cooled to 32 degrees.  Aortic crossclamp was applied and 1 L of cold blood cardioplegia was delivered in split doses between the antegrade aortic and retrograde coronary sinus catheters.  There was a good cardioplegic arrest and septal temperature dropped less than  12 degrees.  Cardioplegia was delivered every 20 minutes or less while the crossclamp was placed.  The first distal anastomosis was to the distal circumflex.  This was a 1.5-mm vessel high in the AV groove.  It had a proximal ostial 90% stenosis.  A reverse saphenous vein was sewn end-to-side with running 7- 0 Prolene with good flow through the graft.  Cardioplegia was redosed. The second distal anastomosis was the ramus intermediate of the left coronary.  This had a proximal 50% to 60% stenosis.  It was 1.75 mm vessel.  A reverse saphenous vein was sewn end-to-side with running 7-0 Prolene.  The ramus intermediate had a proximal stenosis as determined by a 1.5 mm dilator passed proximally to a point of stenosis. Cardioplegia was redosed.  The third distal anastomosis was the distal LAD.  This was a heavily diffusely diseased vessel.  The left IMA pedicle was brought through an opening in the left lateral pericardium and was brought down onto the LAD and sewn end-to-side with running 8-0 Prolene to this 1.5-mm vessel. There was good flow through the anastomosis after the pedicle bulldog was briefly removed from the pedicle and then reapplied.  The pedicle was secured to the epicardium with 6-0 Prolene.  Cardioplegia was redosed.  While the crossclamp was still in place, 2 proximal vein anastomoses were performed on the ascending aorta with a 4.5 mm punch running 6-0 Prolene.  Prior to tying down the final proximal anastomosis, the air was vented from the coronaries with a dose of retrograde warm blood cardioplegia.  The crossclamp was removed.  Heart resumed a spontaneous rhythm.  The vein grafts were de-aired and opened, each had good flow.  Hemostasis was documented to the proximal and distal anastomoses.  The cardioplegia cannulas were removed.  The patient was rewarmed and reperfused.  Temporary pacing wires were applied.  The lungs re-expanded and the ventilator was resumed.   The patient was then weaned off cardiopulmonary bypass without difficulty. The echo showed good LV global function.  Protamine was administered without adverse reaction.  The cannulas removed.  The mediastinum was irrigated with warm saline.  The superior pericardial fat was closed over the aorta.  An anterior mediastinal and left pleural chest tube were placed and brought through separate incisions.  The sternum was closed with interrupted steel wire.  The pectoralis fascia was closed with a running #1 Vicryl.  The subcutaneous and skin layers were closed in running Vicryl and sterile dressings were applied.  Total cardiopulmonary bypass time was 110 minutes.     Kerin PernaPeter Van Trigt, M.D.     PV/MEDQ  D:  01/14/2015  T:  01/15/2015  Job:  [id number]  cc:   Julien Nordmannimothy Gollan, M.D.

## 2015-01-15 NOTE — CV Procedure (Signed)
Intra-operative Transesophageal Echocardiography Report:  Mr. Craig Harvey is a 59 year old male with obesity, essential hypertension, and type 2 diabetes who was admitted on 01/05/2015 with symptoms of unstable angina. Cardiac catheterization revealed a 95% ostial left circumflex lesion . An ostial LAD lesion 70%, a mid left circumflex lesion 80% and a mid LAD lesion 99%. Left ventricular function was normal. There was a right dominant coronary artery system. He is now scheduled to undergo coronary artery bypass grafting by Dr. Donata Clay. Intraoperative transesophageal echocardiography was indicated to evaluate the right and left ventricular function, to serve as a monitor for intraoperative volume status, and to assess for any valvular pathology.   The patient was brought to the operating room at [facility] and general anesthesia was induced without difficulty. Following endotracheal intubation and orogastric suctioning, the transesophageal echocardiography probe was inserted into the esophagus without difficulty.  Impression:Pre-bypass findings:  1. Aortic valve: Aortic valve was normal appearing. The leaflets opened normally without restriction and there was no aortic insufficiency. There was no thickening or calcification of the leaflets noted.   2. Mitral valve: The mitral valve was normal appearing. The leaflets opened normally without restriction. There  were no prolapsing or flail segments noted. There was trace mitral insufficiency. There was no significant mitral annular calcification noted.   3. Left ventricle: There was normal left ventricular systolic function. Left ventricular ejection fraction was estimated at 60-65%. Left ventricular end-diastolic diameter measured 4.35 cm at end diastole at the mid-papillary level in the transgastric short axis view. There was moderate concentric  left ventricular hypertrophy. Left ventricular wall thickness measured 1.15 cm at the posterior  wall at end diastole at the mid-papillary level and 1.20 cm of the anterior wall at end diastole at the mid-papillary level. There were no regional wall motion abnormalities.  4. Right ventricle: The right ventricular cavity was of normal size. There was normal contractility of the right ventricular free wall and normal appearing right ventricular function.  5. Tricuspid valve: The tricuspid valve appeared structurally normal and there was trace tricuspid insufficiency.  6. Interatrial septum: The the interatrial septum was interrogated with color Doppler and bubble study. With color Doppler there was a  possible patent foramen ovale which was small.  7. Left atrium: The left atrial cavity was appeared normal in size and there was no thrombus noted within the  left atrium or left atrial appendage.  8. Ascending aorta: There was a well-defined aortic root and sinotubular ridge. There was no significant  atheromatous disease in the wall of the ascending aorta. There was no effacement of the aortic root.  9. Descending aorta: The descending aorta appeared normal. There was no significant atheromatous disease appreciated in the descending aorta. The aortic diameter was 2.18 cm.  Post-bypass findings:  1. Aortic valve: The aortic valve appeared unchanged from the pre-bypass study. There was no aortic insufficiency. There was normal opening of the aortic valve leaflets.  2. Mitral valve: The mitral leaflets appeared to open normally without restriction. There was trace mitral insufficiency.  3. Left ventricle: There was normal left ventricular systolic function. The ejection fraction was estimated at 60-65%. There was moderate concentric left ventricular hypertrophy which was unchanged from the pre-bypass study.   4. Right ventricle: Right ventricular function appeared normal and unchanged from the pre-bypass study. The right ventricular cavity was of normal size. There was normal appearing  contractility of the right ventricular free wall.Marland Kitchen   5.Tricuspid valve: The tricuspid valve  appeared structurally normal with trace tricuspid insufficiency.  Kipp Broodavid Dae Highley, M.D.

## 2015-01-15 NOTE — Progress Notes (Signed)
RT brought CPAP to patients room. Pt stated that he would put it on before he goes to bed. CPAP setting of 6. Oxygen connector is hooked up and RN has been notified.

## 2015-01-15 NOTE — Progress Notes (Signed)
EKG CRITICAL VALUE     12 lead EKG performed.  Critical value noted.  Lynetta MareHeather Bowman, RN notified.   Wandalee FerdinandKimberly Logan, [place] 01/15/2015 7:48 AM

## 2015-01-15 NOTE — Progress Notes (Signed)
      [address]       [place] [id number]             [phone]       POD # 1 CABG  BP 102/48 mmHg  Pulse 75  Temp(Src) 98.2 F (36.8 C) (Oral)  Resp 39  Ht 5\' 9"  (1.753 m)  Wt 320 lb 6.4 oz (145.332 kg)  BMI 47.29 kg/m2  SpO2 94%   Intake/Output Summary (Last 24 hours) at 01/15/15 1904 Last data filed at 01/15/15 1500  Gross per 24 hour  Intake 2394.3 ml  Output   2860 ml  Net -465.7 ml   Hct= 37 Creatinine 1.06  Continue present care  Viviann SpareSteven C. Dorris FetchHendrickson, MD [facility] [phone]

## 2015-01-16 ENCOUNTER — Inpatient Hospital Stay (HOSPITAL_COMMUNITY): Payer: Medicaid Other

## 2015-01-16 LAB — BASIC METABOLIC PANEL
Anion gap: 6 (ref 5–15)
BUN: 8 mg/dL (ref 6–20)
CO2: 27 mmol/L (ref 22–32)
Calcium: 8.3 mg/dL — ABNORMAL LOW (ref 8.9–10.3)
Chloride: 99 mmol/L — ABNORMAL LOW (ref 101–111)
Creatinine, Ser: 0.83 mg/dL (ref 0.61–1.24)
GFR calc Af Amer: >60 mL/min (ref >60)
GFR calc non Af Amer: >60 mL/min (ref >60)
Glucose, Bld: 192 mg/dL — ABNORMAL HIGH (ref 65–99)
Potassium: 4.7 mmol/L (ref 3.5–5.1)
Sodium: 132 mmol/L — ABNORMAL LOW (ref 135–145)

## 2015-01-16 LAB — GLUCOSE, CAPILLARY
GLUCOSE-CAPILLARY: 221 mg/dL — AB (ref 65–99)
GLUCOSE-CAPILLARY: 169 mg/dL — AB (ref 65–99)
Glucose-Capillary: 173 mg/dL — ABNORMAL HIGH (ref 65–99)
Glucose-Capillary: 181 mg/dL — ABNORMAL HIGH (ref 65–99)
Glucose-Capillary: 189 mg/dL — ABNORMAL HIGH (ref 65–99)
Glucose-Capillary: 227 mg/dL — ABNORMAL HIGH (ref 65–99)

## 2015-01-16 LAB — CBC
HCT: 36 % — ABNORMAL LOW (ref 39.0–52.0)
Hemoglobin: 11.9 g/dL — ABNORMAL LOW (ref 13.0–17.0)
MCH: 28.5 pg (ref 26.0–34.0)
MCHC: 33.1 g/dL (ref 30.0–36.0)
MCV: 86.3 fL (ref 78.0–100.0)
Platelets: 121 10*3/uL — ABNORMAL LOW (ref 150–400)
RBC: 4.17 MIL/uL — ABNORMAL LOW (ref 4.22–5.81)
RDW: 14.8 % (ref 11.5–15.5)
WBC: 13.3 10*3/uL — ABNORMAL HIGH (ref 4.0–10.5)

## 2015-01-16 MED ORDER — ENOXAPARIN SODIUM 40 MG/0.4ML ~~LOC~~ SOLN
40.0000 mg | SUBCUTANEOUS | Status: DC
  Administered 2015-01-16 – 2015-01-23 (×7): 40 mg via SUBCUTANEOUS
  Filled 2015-01-16 (×9): qty 0.4

## 2015-01-16 MED ORDER — MORPHINE SULFATE 2 MG/ML IJ SOLN
2.0000 mg | INTRAMUSCULAR | Status: DC | PRN
  Administered 2015-01-16: 2 mg via INTRAVENOUS
  Filled 2015-01-16: qty 1

## 2015-01-16 MED ORDER — FUROSEMIDE 10 MG/ML IJ SOLN: 80.0000 mg | Freq: Two times a day (BID) | INTRAMUSCULAR | Status: DC

## 2015-01-16 MED ORDER — INSULIN ASPART 100 UNIT/ML ~~LOC~~ SOLN
0.0000 [IU] | SUBCUTANEOUS | Status: DC
Start: 2015-01-16 — End: 2015-01-18
  Administered 2015-01-16: 8 [IU] via SUBCUTANEOUS
  Administered 2015-01-16: 4 [IU] via SUBCUTANEOUS
  Administered 2015-01-16: 2 [IU] via SUBCUTANEOUS
  Administered 2015-01-16 (×2): 4 [IU] via SUBCUTANEOUS
  Administered 2015-01-16 – 2015-01-17 (×2): 8 [IU] via SUBCUTANEOUS
  Administered 2015-01-17: 2 [IU] via SUBCUTANEOUS
  Administered 2015-01-17: 8 [IU] via SUBCUTANEOUS
  Administered 2015-01-17 (×2): 4 [IU] via SUBCUTANEOUS
  Administered 2015-01-18: 2 [IU] via SUBCUTANEOUS
  Administered 2015-01-18: 4 [IU] via SUBCUTANEOUS

## 2015-01-16 MED ORDER — METOLAZONE 5 MG PO TABS
5.0000 mg | ORAL_TABLET | Freq: Every day | ORAL | Status: AC
  Administered 2015-01-16 – 2015-01-18 (×3): 5 mg via ORAL
  Filled 2015-01-16 (×3): qty 1

## 2015-01-16 MED ORDER — FUROSEMIDE 10 MG/ML IJ SOLN
80.0000 mg | Freq: Two times a day (BID) | INTRAMUSCULAR | Status: DC
  Administered 2015-01-16: 40 mg via INTRAVENOUS
  Administered 2015-01-16 – 2015-01-17 (×3): 80 mg via INTRAVENOUS
  Filled 2015-01-16 (×7): qty 8

## 2015-01-16 MED ORDER — METFORMIN HCL 500 MG PO TABS
500.0000 mg | ORAL_TABLET | Freq: Two times a day (BID) | ORAL | Status: DC
  Administered 2015-01-16 – 2015-01-18 (×5): 500 mg via ORAL
  Filled 2015-01-16 (×7): qty 1

## 2015-01-16 NOTE — Care Management Note (Signed)
Case Management Note  Patient Details  Name: Craig ChallengerDavid L Rivers MRN: [id number] Date of Birth: 10/13/1955  Subjective/Objective:      Independent prior to admission - lived with Mom.  Anticipate discharge home with mom and brother.               Action/Plan:   Expected Discharge Date:                  Expected Discharge Plan:  Home/Self Care  In-House Referral:     Discharge planning Services     Post Acute Care Choice:    Choice offered to:     DME Arranged:    DME Agency:     HH Arranged:    HH Agency:     Status of Service:  In process, will continue to follow  Medicare Important Message Given:    Date Medicare IM Given:    Medicare IM give by:    Date Additional Medicare IM Given:    Additional Medicare Important Message give by:     If discussed at Long Length of Stay Meetings, dates discussed:    Additional Comments:  Vangie BickerBrown, Siniyah Evangelist Jane, RN 01/16/2015, 10:28 AM

## 2015-01-16 NOTE — Progress Notes (Signed)
Anesthesiology Follow-up:  Awake and alert, neuro intact sitting in chair in good spirits.   VS: T-36.8 BP- 120/63 HR- 87 (SR)  RR- 24 O2 Sat 96% on 4L  K-4.7 BUN/Cr. 8/0.83 glucose 192 H/H 11.9/36.0 platelts 121,000  Extubated 5 1/2 hours post-op.  59 year old male 2 days S/P CABG X 3, doing well overall no apparent complications.  Craig Harvey

## 2015-01-16 NOTE — Progress Notes (Signed)
Inpatient Diabetes Program Recommendations  AACE/ADA: New Consensus Statement on Inpatient Glycemic Control (2013)  Target Ranges:  Prepandial:   less than 140 mg/dL      Peak postprandial:   less than 180 mg/dL (1-2 hours)      Critically ill patients:  140 - 180 mg/dL   Results for Craig Harvey, Craig Harvey (MRN [id number]) as of 01/16/2015 14:51  Ref. Range 01/15/2015 22:09 01/15/2015 23:59 01/16/2015 04:09 01/16/2015 08:32 01/16/2015 12:36  Glucose-Capillary Latest Ref Range: 65-99 mg/dL 829221 (H) 562173 (H) 130181 (H) 169 (H) 227 (H)   Please consider adding Novolog 5 units tid with meals to cover carbohydrate intake and prevent post-prandial rises in blood sugar levels.  Thanks, Beryl MeagerJenny Hilberto Burzynski, RN, BC-ADM Inpatient Diabetes Coordinator Pager [phone] (8a-5p)

## 2015-01-16 NOTE — Progress Notes (Addendum)
[facility] DAILY ICU PROGRESS NOTE                   [address]            [place], [id number]          [phone]   2 Days Post-Op Procedure(s) (LRB): CORONARY ARTERY BYPASS GRAFTING (CABG), ON PUMP, TIMES THREE, USING LEFT INTERNAL MAMMARY ARTERY, RIGHT GREATER SAPHENOUS VEIN HARVESTED ENDOSCOPICALLY (N/A) TRANSESOPHAGEAL ECHOCARDIOGRAM (TEE) (N/A)  Total Length of Stay:  LOS: 7 days   Subjective: OOB in chair.  Rested poorly last night, but no complaints this am.    Objective: Vital signs in last 24 hours: Temp:  [97.9 F (36.6 C)-99.8 F (37.7 C)] 99.8 F (37.7 C) (07/14 0400) Pulse Rate:  [74-101] 93 (07/14 0700) Cardiac Rhythm:  [-] Normal sinus rhythm (07/14 0734) Resp:  [20-41] 30 (07/14 0700) BP: (96-141)/(48-81) 106/58 mmHg (07/14 0700) SpO2:  [88 %-96 %] 91 % (07/14 0700) Arterial Line BP: (153)/(57) 153/57 mmHg (07/13 0900) Weight:  [321 lb 11.2 oz (145.922 kg)] 321 lb 11.2 oz (145.922 kg) (07/14 0600)  Filed Weights   01/09/15 1520 01/15/15 0215 01/16/15 0600  Weight: 317 lb 3.9 oz (143.9 kg) 320 lb 6.4 oz (145.332 kg) 321 lb 11.2 oz (145.922 kg)  PRE-OPERATIVE WEIGHT: 144 kg  Weight change: 1 lb 4.8 oz (0.59 kg)   Hemodynamic parameters for last 24 hours: PAP: (27-30)/(12-14) 30/14 mmHg  Intake/Output from previous day: 07/13 0701 - 07/14 0700 In: 1839.5 [P.O.:720; I.V.:319.5; IV Piggyback:800] Out: 3075 [Urine:2565; Chest Tube:510]  CBGs [phone]    Current Meds: Scheduled Meds: . acetaminophen  1,000 mg Oral 4 times per day   Or  . acetaminophen (TYLENOL) oral liquid 160 mg/5 mL  1,000 mg Per Tube 4 times per day  . antiseptic oral rinse  7 mL Mouth Rinse BID  . aspirin EC  325 mg Oral Daily   Or  . aspirin  324 mg Per Tube Daily  . atorvastatin  80 mg Oral q1800  . bisacodyl  10 mg Oral Daily   Or  . bisacodyl  10 mg Rectal Daily  . cefUROXime (ZINACEF)  IV  1.5 g Intravenous Q12H  . docusate sodium  200 mg  Oral Daily  . furosemide  40 mg Intravenous BID  . insulin aspart  0-24 Units Subcutaneous Q4H  . insulin detemir  24 Units Subcutaneous BID  . insulin regular  0-10 Units Intravenous TID WC  . metoCLOPramide (REGLAN) injection  10 mg Intravenous 4 times per day  . metoprolol tartrate  12.5 mg Oral BID   Or  . metoprolol tartrate  12.5 mg Per Tube BID  . pantoprazole  40 mg Oral Daily  . sodium chloride  3 mL Intravenous Q12H   Continuous Infusions: . sodium chloride 10 mL/hr at 01/16/15 0600  . sodium chloride    . insulin (NOVOLIN-R) infusion 6.6 Units/hr (01/15/15 1300)  . lactated ringers 10 mL/hr at 01/15/15 0700  . lactated ringers Stopped (01/15/15 1100)  . nitroGLYCERIN Stopped (01/14/15 1900)  . phenylephrine (NEO-SYNEPHRINE) Adult infusion 0 mcg/min (01/14/15 1603)   PRN Meds:.metoprolol, morphine injection, ondansetron (ZOFRAN) IV, oxyCODONE, sodium chloride, traMADol   Physical Exam: General appearance: alert, cooperative and no distress Heart: regular rate and rhythm Lungs: Slightly diminished BS in bases, overall clear Extremities: Minimal LE edema Wound: Dressed and dry   Lab Results: CBC: Recent Labs  01/15/15 1708 01/16/15 0445  WBC 14.0*  13.3*  HGB 12.6* 11.9*  HCT 37.4* 36.0*  PLT 130* 121*   BMET:  Recent Labs  01/15/15 0415 01/15/15 1652 01/15/15 1708 01/16/15 0445  NA 134* 132*  --  132*  K 4.3 4.7  --  4.7  CL 104 98*  --  99*  CO2 21*  --   --  27  GLUCOSE 137* 244*  --  192*  BUN 8 11  --  8  CREATININE 0.79 0.90 1.06 0.83  CALCIUM 8.0*  --   --  8.3*    PT/INR:  Recent Labs  01/14/15 1345  LABPROT 19.5*  INR 1.65*   Radiology: Dg Chest Port 1 View  01/16/2015   CLINICAL DATA:  Status post CABG, history of diabetes and morbid obesity.  EXAM: PORTABLE CHEST - 1 VIEW  COMPARISON:  Portable chest x-ray of January 15, 2015  FINDINGS: The lungs remain mildly hypoinflated. The left heart border is less well demonstrated today and the  left hemidiaphragm is now obscured. The left-sided chest tube is unchanged in position. There is no pneumothorax. The cardiac silhouette remains enlarged. The pulmonary vascularity is mildly prominent centrally. The right internal jugular venous catheter tip projects over the junction of the middle and distal thirds of the SVC. There are 7 intact sternal wires.  IMPRESSION: Interval deterioration in the left lower lobe consistent with atelectasis or pneumonia. A small left pleural effusion is suspected. Low-grade CHF.   Electronically Signed   By: Dorell  [place] M.D.   On: 01/16/2015 07:51     Assessment/Plan: S/P Procedure(s) (LRB): CORONARY ARTERY BYPASS GRAFTING (CABG), ON PUMP, TIMES THREE, USING LEFT INTERNAL MAMMARY ARTERY, RIGHT GREATER SAPHENOUS VEIN HARVESTED ENDOSCOPICALLY (N/A) TRANSESOPHAGEAL ECHOCARDIOGRAM (TEE) (N/A)  CV- SR, BPs stable.  Continue Lopressor, will add ACE-I as able.  Pulm- still on 6L. Continue pulm toilet/IS.  Vol overload- diurese.  DM- CBGs elevated. Continue Levemir, will resume po metformin since Cr is stable and titrate dose as able. A1C=10.1  CT output decreasing, 210/last shift.  No air leak and CXR stable.  Hopefully can d/c CT soon.  Possibly d/c Foley and tx to floor if he remains stable today.  CRPI/ambulation.   COLLINS,GINA H 01/16/2015 8:17 AM  CT output remains significant- leave in place Wt up postop-  Increase diuretic doses Keep foley for diuresis CXR w/ atelectasis L base    keep in ICU for increased O2 requirement

## 2015-01-16 NOTE — Progress Notes (Signed)
Patient ID: Craig Harvey, male   DOB: 07/17/1955, 59 y.o.   MRN: [id number] EVENING ROUNDS NOTE :     [address]       [place] [id number]             [phone]                 2 Days Post-Op Procedure(s) (LRB): CORONARY ARTERY BYPASS GRAFTING (CABG), ON PUMP, TIMES THREE, USING LEFT INTERNAL MAMMARY ARTERY, RIGHT GREATER SAPHENOUS VEIN HARVESTED ENDOSCOPICALLY (N/A) TRANSESOPHAGEAL ECHOCARDIOGRAM (TEE) (N/A)  Total Length of Stay:  LOS: 7 days  BP 129/77 mmHg  Pulse 98  Temp(Src) 98.9 F (37.2 C) (Oral)  Resp 39  Ht 5\' 9"  (1.753 m)  Wt 321 lb 11.2 oz (145.922 kg)  BMI 47.49 kg/m2  SpO2 92%  .Intake/Output      07/13 0701 - 07/14 0700 07/14 0701 - 07/15 0700   P.O. 720    I.V. (mL/kg) 319.5 (2.2) 40 (0.3)   Blood     IV Piggyback 800    Total Intake(mL/kg) 1839.5 (12.6) 40 (0.3)   Urine (mL/kg/hr) 2565 (0.7) 1340 (0.8)   Blood     Chest Tube 510 (0.1) 80 (0)   Total Output 3075 1420   Net -1235.5 -1380          . sodium chloride Stopped (01/16/15 1200)  . sodium chloride    . insulin (NOVOLIN-R) infusion 6.6 Units/hr (01/15/15 1300)  . lactated ringers 10 mL/hr at 01/15/15 0700  . lactated ringers Stopped (01/15/15 1100)  . phenylephrine (NEO-SYNEPHRINE) Adult infusion 0 mcg/min (01/14/15 1603)     Lab Results  Component Value Date   WBC 13.3* 01/16/2015   HGB 11.9* 01/16/2015   HCT 36.0* 01/16/2015   PLT 121* 01/16/2015   GLUCOSE 192* 01/16/2015   CHOL 269* 01/09/2015   TRIG 148 01/09/2015   HDL 46 01/09/2015   LDLCALC 193* 01/09/2015   ALT 71* 01/12/2015   AST 43* 01/12/2015   NA 132* 01/16/2015   K 4.7 01/16/2015   CL 99* 01/16/2015   CREATININE 0.83 01/16/2015   BUN 8 01/16/2015   CO2 27 01/16/2015   TSH 0.741 01/09/2015   INR 1.65* 01/14/2015   HGBA1C 10.1* 01/06/2015   o2 sat ok , walked around unit Stable sinus rhythm  Craig OvensEdward B Deyja Sochacki MD  Beeper [phone] Office [phone] 01/16/2015 6:31 PM

## 2015-01-16 NOTE — Progress Notes (Signed)
EKG CRITICAL VALUE     12 lead EKG performed.  Critical value noted.  Marlane HatcherPatty Keaton, RN notified.   Renda Pohlman C, CCT 01/16/2015 9:11 AM

## 2015-01-17 ENCOUNTER — Inpatient Hospital Stay (HOSPITAL_COMMUNITY): Payer: Medicaid Other

## 2015-01-17 LAB — GLUCOSE, CAPILLARY
GLUCOSE-CAPILLARY: 158 mg/dL — AB (ref 65–99)
GLUCOSE-CAPILLARY: 212 mg/dL — AB (ref 65–99)
Glucose-Capillary: 213 mg/dL — ABNORMAL HIGH (ref 65–99)
Glucose-Capillary: 144 mg/dL — ABNORMAL HIGH (ref 65–99)
Glucose-Capillary: 167 mg/dL — ABNORMAL HIGH (ref 65–99)
Glucose-Capillary: 202 mg/dL — ABNORMAL HIGH (ref 65–99)

## 2015-01-17 LAB — BASIC METABOLIC PANEL
Anion gap: 7 (ref 5–15)
BUN: 12 mg/dL (ref 6–20)
CO2: 31 mmol/L (ref 22–32)
Calcium: 8.5 mg/dL — ABNORMAL LOW (ref 8.9–10.3)
Chloride: 94 mmol/L — ABNORMAL LOW (ref 101–111)
Creatinine, Ser: 0.84 mg/dL (ref 0.61–1.24)
GFR calc Af Amer: >60 mL/min (ref >60)
GFR calc non Af Amer: >60 mL/min (ref >60)
Glucose, Bld: 151 mg/dL — ABNORMAL HIGH (ref 65–99)
Potassium: 3.6 mmol/L (ref 3.5–5.1)
Sodium: 132 mmol/L — ABNORMAL LOW (ref 135–145)

## 2015-01-17 LAB — CBC
HCT: 32.5 % — ABNORMAL LOW (ref 39.0–52.0)
Hemoglobin: 10.9 g/dL — ABNORMAL LOW (ref 13.0–17.0)
MCH: 28.7 pg (ref 26.0–34.0)
MCHC: 33.5 g/dL (ref 30.0–36.0)
MCV: 85.5 fL (ref 78.0–100.0)
Platelets: 108 10*3/uL — ABNORMAL LOW (ref 150–400)
RBC: 3.8 MIL/uL — ABNORMAL LOW (ref 4.22–5.81)
RDW: 14.3 % (ref 11.5–15.5)
WBC: 11 10*3/uL — ABNORMAL HIGH (ref 4.0–10.5)

## 2015-01-17 LAB — MAGNESIUM: Magnesium: 2.1 mg/dL (ref 1.7–2.4)

## 2015-01-17 LAB — TSH: TSH: 0.924 u[IU]/mL (ref 0.350–4.500)

## 2015-01-17 MED ORDER — POTASSIUM CHLORIDE 10 MEQ/50ML IV SOLN
10.0000 meq | INTRAVENOUS | Status: AC
  Administered 2015-01-17 (×3): 10 meq via INTRAVENOUS
  Filled 2015-01-17 (×2): qty 50

## 2015-01-17 MED ORDER — MORPHINE SULFATE 2 MG/ML IJ SOLN: 2.0000 mg | INTRAMUSCULAR | Status: AC | PRN

## 2015-01-17 MED ORDER — POTASSIUM CHLORIDE 10 MEQ/50ML IV SOLN
10.0000 meq | INTRAVENOUS | Status: AC
Start: 2015-01-17 — End: 2015-01-17
  Administered 2015-01-17 (×2): 10 meq via INTRAVENOUS
  Filled 2015-01-17: qty 50

## 2015-01-17 MED ORDER — POTASSIUM CHLORIDE 10 MEQ/50ML IV SOLN
10.0000 meq | INTRAVENOUS | Status: AC
  Administered 2015-01-17 (×2): 10 meq via INTRAVENOUS

## 2015-01-17 MED ORDER — AMIODARONE HCL 200 MG PO TABS: 400.0000 mg | ORAL_TABLET | Freq: Every day | ORAL | Status: DC

## 2015-01-17 MED ORDER — AMIODARONE HCL IN DEXTROSE 360-4.14 MG/200ML-% IV SOLN
30.0000 mg/h | INTRAVENOUS | Status: DC
  Administered 2015-01-17 (×2): 30 mg/h via INTRAVENOUS
  Filled 2015-01-17 (×6): qty 200

## 2015-01-17 MED ORDER — AMIODARONE LOAD VIA INFUSION
150.0000 mg | Freq: Once | INTRAVENOUS | Status: AC
  Administered 2015-01-17: 150 mg via INTRAVENOUS
  Filled 2015-01-17: qty 83.34

## 2015-01-17 MED ORDER — AMIODARONE HCL 200 MG PO TABS
400.0000 mg | ORAL_TABLET | Freq: Two times a day (BID) | ORAL | Status: DC
  Administered 2015-01-17 – 2015-01-20 (×7): 400 mg via ORAL
  Filled 2015-01-17 (×9): qty 2

## 2015-01-17 MED ORDER — AMIODARONE HCL IN DEXTROSE 360-4.14 MG/200ML-% IV SOLN
60.0000 mg/h | INTRAVENOUS | Status: AC
  Administered 2015-01-17 (×2): 60 mg/h via INTRAVENOUS
  Filled 2015-01-17: qty 200

## 2015-01-17 NOTE — Progress Notes (Signed)
TCTS BRIEF SICU PROGRESS NOTE  3 Days Post-Op  S/P Procedure(s) (LRB): CORONARY ARTERY BYPASS GRAFTING (CABG), ON PUMP, TIMES THREE, USING LEFT INTERNAL MAMMARY ARTERY, RIGHT GREATER SAPHENOUS VEIN HARVESTED ENDOSCOPICALLY (N/A) TRANSESOPHAGEAL ECHOCARDIOGRAM (TEE) (N/A)   Stable day NSR most of the day w/ stable BP Diuresing very well  Plan: Continue current plan  Craig Harvey 01/17/2015 9:32 PM

## 2015-01-17 NOTE — Progress Notes (Signed)
During ambulation this am, pt converted to Afib rates 110s-140s.  BP 106/56 (66).  Pt asymptomatic.  Pt walked back to chair in room.  Dr. Tyrone SageGerhardt notiifed, orders received.  Roselie AwkwardShannon Mersadez Linden, RN

## 2015-01-17 NOTE — Progress Notes (Signed)
3 Days Post-Op Procedure(s) (LRB): CORONARY ARTERY BYPASS GRAFTING (CABG), ON PUMP, TIMES THREE, USING LEFT INTERNAL MAMMARY ARTERY, RIGHT GREATER SAPHENOUS VEIN HARVESTED ENDOSCOPICALLY (N/A) TRANSESOPHAGEAL ECHOCARDIOGRAM (TEE) (N/A) Subjective: Morbid obesity, DM A-1 C 10.2 Postop afib on iv amiodarone, lovenox 40 Fluid overload on lasix 80 bidafebrile   Objective: Vital signs in last 24 hours: Temp:  [98.2 F (36.8 C)-98.9 F (37.2 C)] 98.2 F (36.8 C) (07/15 0400) Pulse Rate:  [87-132] 132 (07/15 0640) Cardiac Rhythm:  [-] Atrial fibrillation (07/15 0640) Resp:  [0-39] 26 (07/15 0640) BP: (103-135)/(52-78) 106/59 mmHg (07/15 0640) SpO2:  [88 %-96 %] 92 % (07/15 0640) Weight:  [316 lb 2.2 oz (143.4 kg)] 316 lb 2.2 oz (143.4 kg) (07/15 0600)  Hemodynamic parameters for last 24 hours:   afebrile, atrial fibrillation Intake/Output from previous day: 07/14 0701 - 07/15 0700 In: 450 [P.O.:360; I.V.:40; IV Piggyback:50] Out: 3825 [Urine:3715; Chest Tube:110] Intake/Output this shift:    OOB to chair Decreased BS L base Neuro intact  Lab Results:  Recent Labs  01/16/15 0445 01/17/15 0455  WBC 13.3* 11.0*  HGB 11.9* 10.9*  HCT 36.0* 32.5*  PLT 121* 108*   BMET:  Recent Labs  01/16/15 0445 01/17/15 0455  NA 132* 132*  K 4.7 3.6  CL 99* 94*  CO2 27 31  GLUCOSE 192* 151*  BUN 8 12  CREATININE 0.83 0.84  CALCIUM 8.3* 8.5*    PT/INR:  Recent Labs  01/14/15 1345  LABPROT 19.5*  INR 1.65*   ABG    Component Value Date/Time   PHART 7.363 01/14/2015 1920   HCO3 23.2 01/14/2015 1920   TCO2 22 01/15/2015 1652   ACIDBASEDEF 2.0 01/14/2015 1920   O2SAT 95.0 01/14/2015 1920   CBG (last 3)   Recent Labs  01/16/15 1956 01/16/15 2334 01/17/15 0346  GLUCAP 213* 158* 144*    Assessment/Plan: S/P Procedure(s) (LRB): CORONARY ARTERY BYPASS GRAFTING (CABG), ON PUMP, TIMES THREE, USING LEFT INTERNAL MAMMARY ARTERY, RIGHT GREATER SAPHENOUS VEIN HARVESTED  ENDOSCOPICALLY (N/A) TRANSESOPHAGEAL ECHOCARDIOGRAM (TEE) (N/A) Mobilize Diuresis Diabetes control d/c tubes/lines     Remove L pleural tube, foley and neck sleeve  LOS: 8 days    [name] 01/17/2015

## 2015-01-17 NOTE — Progress Notes (Signed)
Per Dr. Donata ClayVan Trigt, give po Amio x2 doses then okay to discontinue amio drip. Darrel HooverWilson,Luisdaniel Kenton S 5:55 PM

## 2015-01-17 NOTE — Progress Notes (Signed)
  Amiodarone Drug - Drug Interaction Consult Note  Recommendations: MONITOR POTASSIUM Amiodarone is metabolized by the cytochrome P450 system and therefore has the potential to cause many drug interactions. Amiodarone has an average plasma half-life of 50 days (range 20 to 100 days).   There is potential for drug interactions to occur several weeks or months after stopping treatment and the onset of drug interactions may be slow after initiating amiodarone.   []  Statins: Increased risk of myopathy. Simvastatin- restrict dose to 20mg  daily. Other statins: counsel patients to report any muscle pain or weakness immediately.  []  Anticoagulants: Amiodarone can increase anticoagulant effect. Consider warfarin dose reduction. Patients should be monitored closely and the dose of anticoagulant altered accordingly, remembering that amiodarone levels take several weeks to stabilize.  []  Antiepileptics: Amiodarone can increase plasma concentration of phenytoin, the dose should be reduced. Note that small changes in phenytoin dose can result in large changes in levels. Monitor patient and counsel on signs of toxicity.  [x]  Beta blockers (LOPRESSOR): increased risk of bradycardia, AV block and myocardial depression. Sotalol - avoid concomitant use.  []   Calcium channel blockers (diltiazem and verapamil): increased risk of bradycardia, AV block and myocardial depression.  []   Cyclosporine: Amiodarone increases levels of cyclosporine. Reduced dose of cyclosporine is recommended.  []  Digoxin dose should be halved when amiodarone is started.  [x]  Diuretics (LASIX, ZAROXOLYN): increased risk of cardiotoxicity if hypokalemia occurs.  []  Oral hypoglycemic agents (glyburide, glipizide, glimepiride): increased risk of hypoglycemia. Patient's glucose levels should be monitored closely when initiating amiodarone therapy.   []  Drugs that prolong the QT interval:  Torsades de pointes risk may be increased with  concurrent use - avoid if possible.  Monitor QTc, also keep magnesium/potassium WNL if concurrent therapy can't be avoided. Marland Kitchen. Antibiotics: e.g. fluoroquinolones, erythromycin. . Antiarrhythmics: e.g. quinidine, procainamide, disopyramide, sotalol. . Antipsychotics: e.g. phenothiazines, haloperidol.  . Lithium, tricyclic antidepressants, and methadone.  Thank You,  Colleen CanBryk, Craig Harvey Craig Harvey  01/17/2015 6:43 AM

## 2015-01-17 NOTE — Progress Notes (Signed)
Pt placed on CPAP for the night @ previous settings. Tol well

## 2015-01-18 ENCOUNTER — Inpatient Hospital Stay (HOSPITAL_COMMUNITY): Payer: Medicaid Other

## 2015-01-18 LAB — COMPREHENSIVE METABOLIC PANEL
ALT: 38 U/L (ref 17–63)
AST: 29 U/L (ref 15–41)
Albumin: 2.8 g/dL — ABNORMAL LOW (ref 3.5–5.0)
Alkaline Phosphatase: 49 U/L (ref 38–126)
Anion gap: 9 (ref 5–15)
BUN: 17 mg/dL (ref 6–20)
CO2: 32 mmol/L (ref 22–32)
Calcium: 8.7 mg/dL — ABNORMAL LOW (ref 8.9–10.3)
Chloride: 88 mmol/L — ABNORMAL LOW (ref 101–111)
Creatinine, Ser: 1.11 mg/dL (ref 0.61–1.24)
GFR calc Af Amer: >60 mL/min (ref >60)
GFR calc non Af Amer: >60 mL/min (ref >60)
Glucose, Bld: 175 mg/dL — ABNORMAL HIGH (ref 65–99)
Potassium: 3.5 mmol/L (ref 3.5–5.1)
Sodium: 129 mmol/L — ABNORMAL LOW (ref 135–145)
Total Bilirubin: 0.8 mg/dL (ref 0.3–1.2)
Total Protein: 7 g/dL (ref 6.5–8.1)

## 2015-01-18 LAB — GLUCOSE, CAPILLARY
GLUCOSE-CAPILLARY: 223 mg/dL — AB (ref 65–99)
Glucose-Capillary: 169 mg/dL — ABNORMAL HIGH (ref 65–99)
Glucose-Capillary: 146 mg/dL — ABNORMAL HIGH (ref 65–99)
Glucose-Capillary: 149 mg/dL — ABNORMAL HIGH (ref 65–99)

## 2015-01-18 LAB — CBC
HCT: 33.2 % — ABNORMAL LOW (ref 39.0–52.0)
Hemoglobin: 11.3 g/dL — ABNORMAL LOW (ref 13.0–17.0)
MCH: 29.1 pg (ref 26.0–34.0)
MCHC: 34 g/dL (ref 30.0–36.0)
MCV: 85.6 fL (ref 78.0–100.0)
Platelets: 164 10*3/uL (ref 150–400)
RBC: 3.88 MIL/uL — ABNORMAL LOW (ref 4.22–5.81)
RDW: 14.1 % (ref 11.5–15.5)
WBC: 11.1 10*3/uL — ABNORMAL HIGH (ref 4.0–10.5)

## 2015-01-18 MED ORDER — ASPIRIN EC 325 MG PO TBEC
325.0000 mg | DELAYED_RELEASE_TABLET | Freq: Every day | ORAL | Status: DC
  Administered 2015-01-19 – 2015-01-23 (×5): 325 mg via ORAL
  Filled 2015-01-18 (×6): qty 1

## 2015-01-18 MED ORDER — POTASSIUM CHLORIDE 10 MEQ/50ML IV SOLN
10.0000 meq | INTRAVENOUS | Status: AC
  Administered 2015-01-18 (×3): 10 meq via INTRAVENOUS
  Filled 2015-01-18 (×5): qty 50

## 2015-01-18 MED ORDER — SODIUM CHLORIDE 0.9 % IJ SOLN: 3.0000 mL | INTRAMUSCULAR | Status: DC | PRN

## 2015-01-18 MED ORDER — METFORMIN HCL 500 MG PO TABS
1000.0000 mg | ORAL_TABLET | Freq: Two times a day (BID) | ORAL | Status: DC
  Administered 2015-01-18 – 2015-01-23 (×10): 1000 mg via ORAL
  Filled 2015-01-18 (×12): qty 2

## 2015-01-18 MED ORDER — SODIUM CHLORIDE 0.9 % IV SOLN: 250.0000 mL | INTRAVENOUS | Status: DC | PRN

## 2015-01-18 MED ORDER — INSULIN DETEMIR 100 UNIT/ML ~~LOC~~ SOLN
32.0000 [IU] | Freq: Two times a day (BID) | SUBCUTANEOUS | Status: DC
  Administered 2015-01-18 – 2015-01-20 (×4): 32 [IU] via SUBCUTANEOUS
  Filled 2015-01-18 (×6): qty 0.32

## 2015-01-18 MED ORDER — METOPROLOL SUCCINATE ER 25 MG PO TB24
25.0000 mg | ORAL_TABLET | Freq: Every day | ORAL | Status: DC
  Administered 2015-01-18 – 2015-01-23 (×6): 25 mg via ORAL
  Filled 2015-01-18 (×6): qty 1

## 2015-01-18 MED ORDER — SODIUM CHLORIDE 0.9 % IJ SOLN
3.0000 mL | Freq: Two times a day (BID) | INTRAMUSCULAR | Status: DC
  Administered 2015-01-18 – 2015-01-23 (×7): 3 mL via INTRAVENOUS

## 2015-01-18 MED ORDER — POTASSIUM CHLORIDE CRYS ER 20 MEQ PO TBCR
20.0000 meq | EXTENDED_RELEASE_TABLET | Freq: Two times a day (BID) | ORAL | Status: DC
  Administered 2015-01-18 – 2015-01-19 (×4): 20 meq via ORAL
  Filled 2015-01-18 (×7): qty 1

## 2015-01-18 MED ORDER — MOVING RIGHT ALONG BOOK
Freq: Once | Status: AC
  Administered 2015-01-18: 10:00:00
  Filled 2015-01-18: qty 1

## 2015-01-18 MED ORDER — FUROSEMIDE 40 MG PO TABS
40.0000 mg | ORAL_TABLET | Freq: Two times a day (BID) | ORAL | Status: DC
Start: 2015-01-18 — End: 2015-01-20
  Administered 2015-01-18 – 2015-01-20 (×5): 40 mg via ORAL
  Filled 2015-01-18 (×7): qty 1

## 2015-01-18 MED ORDER — POTASSIUM CHLORIDE 10 MEQ/50ML IV SOLN
10.0000 meq | INTRAVENOUS | Status: AC
  Administered 2015-01-18 (×2): 10 meq via INTRAVENOUS

## 2015-01-18 MED ORDER — INSULIN ASPART 100 UNIT/ML ~~LOC~~ SOLN
0.0000 [IU] | Freq: Three times a day (TID) | SUBCUTANEOUS | Status: DC
  Administered 2015-01-18: 8 [IU] via SUBCUTANEOUS
  Administered 2015-01-18: 2 [IU] via SUBCUTANEOUS
  Administered 2015-01-19: 8 [IU] via SUBCUTANEOUS
  Administered 2015-01-19 (×3): 4 [IU] via SUBCUTANEOUS
  Administered 2015-01-20: 2 [IU] via SUBCUTANEOUS
  Administered 2015-01-20: 4 [IU] via SUBCUTANEOUS
  Administered 2015-01-21 (×2): 2 [IU] via SUBCUTANEOUS
  Administered 2015-01-21: 4 [IU] via SUBCUTANEOUS
  Administered 2015-01-22: 2 [IU] via SUBCUTANEOUS

## 2015-01-18 NOTE — Progress Notes (Signed)
      [address]       [place] [id number]             [phone]        CARDIOTHORACIC SURGERY PROGRESS NOTE   R4 Days Post-Op Procedure(s) (LRB): CORONARY ARTERY BYPASS GRAFTING (CABG), ON PUMP, TIMES THREE, USING LEFT INTERNAL MAMMARY ARTERY, RIGHT GREATER SAPHENOUS VEIN HARVESTED ENDOSCOPICALLY (N/A) TRANSESOPHAGEAL ECHOCARDIOGRAM (TEE) (N/A)  Subjective: Looks good and feels well.  No complaints.  Objective: Vital signs: BP Readings from Last 1 Encounters:  01/18/15 109/66   Pulse Readings from Last 1 Encounters:  01/18/15 76   Resp Readings from Last 1 Encounters:  01/18/15 14   Temp Readings from Last 1 Encounters:  01/18/15 97.4 F (36.3 C) Oral    Hemodynamics:    Physical Exam:  Rhythm:   sinus  Breath sounds: clear  Heart sounds:  RRR  Incisions:  Clean and dry  Abdomen:  Soft, non-distended, non-tender  Extremities:  Warm, well-perfused    Intake/Output from previous day: 07/15 0701 - 07/16 0700 In: 2127.1 [P.O.:1320; I.V.:707.1; IV Piggyback:100] Out: 3495 [Urine:3435; Chest Tube:60] Intake/Output this shift: Total I/O In: -  Out: 475 [Urine:475]  Lab Results:  CBC: Recent Labs  01/17/15 0455 01/18/15 0510  WBC 11.0* 11.1*  HGB 10.9* 11.3*  HCT 32.5* 33.2*  PLT 108* 164    BMET:  Recent Labs  01/17/15 0455 01/18/15 0510  NA 132* 129*  K 3.6 3.5  CL 94* 88*  CO2 31 32  GLUCOSE 151* 175*  BUN 12 17  CREATININE 0.84 1.11  CALCIUM 8.5* 8.7*     PT/INR:  No results for input(s): LABPROT, INR in the last 72 hours.  CBG (last 3)   Recent Labs  01/17/15 1717 01/18/15 0434 01/18/15 0833  GLUCAP 212* 169* 146*    ABG    Component Value Date/Time   PHART 7.363 01/14/2015 1920   PCO2ART 40.8 01/14/2015 1920   PO2ART 77.0* 01/14/2015 1920   HCO3 23.2 01/14/2015 1920   TCO2 22 01/15/2015 1652   ACIDBASEDEF 2.0 01/14/2015 1920   O2SAT 95.0 01/14/2015 1920    CXR: CHEST 2 VIEW  COMPARISON:  01/17/2015  FINDINGS: Right IJ central line tip to the level of the superior vena cava. Status post median sternotomy and CABG. Left-sided chest tube has been removed.  Heart is enlarged. There has been improvement in aeration of the left lung. No significant pulmonary edema. Suspect less than 5% left apical pneumothorax. Small bilateral effusions.  IMPRESSION: 1. Status post removal of left-sided chest tube. Less than 5% apical pneumothorax identified. 2. Cardiomegaly without pulmonary edema. 3. Small bilateral pleural effusions.   Electronically Signed  By: Norva PavlovElizabeth Brown M.D.  On: 01/18/2015 07:58   Assessment/Plan: S/P Procedure(s) (LRB): CORONARY ARTERY BYPASS GRAFTING (CABG), ON PUMP, TIMES THREE, USING LEFT INTERNAL MAMMARY ARTERY, RIGHT GREATER SAPHENOUS VEIN HARVESTED ENDOSCOPICALLY (N/A) TRANSESOPHAGEAL ECHOCARDIOGRAM (TEE) (N/A)  Overall doing well POD4 Post-op Afib - now maintaining NSR on amiodarone Expected post op acute blood loss anemia, stable Expected post op volume excess, mild, diuresing well Type II diabetes mellitus, adequate glycemic control although CBG's up some   Mobilize  Diuresis  Restart home dose metformin  Increase levimir  Transfer step down   Purcell Nailslarence H Steve Youngberg 01/18/2015 10:00 AM

## 2015-01-18 NOTE — Progress Notes (Addendum)
[facility]. Craig Harvey,Craig Harvey 4:28 PM   Pt ambulated to 2West, tolerated well, on 2L, SR, pt left sitting on sob, RN and NT at bedside. 1700

## 2015-01-19 ENCOUNTER — Inpatient Hospital Stay (HOSPITAL_COMMUNITY): Payer: Medicaid Other

## 2015-01-19 LAB — BASIC METABOLIC PANEL
ANION GAP: 11 (ref 5–15)
BUN: 20 mg/dL (ref 6–20)
CALCIUM: 8.9 mg/dL (ref 8.9–10.3)
CO2: 32 mmol/L (ref 22–32)
CREATININE: 1.14 mg/dL (ref 0.61–1.24)
Chloride: 86 mmol/L — ABNORMAL LOW (ref 101–111)
GFR calc Af Amer: >60 mL/min (ref >60)
GFR calc non Af Amer: >60 mL/min (ref >60)
GLUCOSE: 156 mg/dL — AB (ref 65–99)
Potassium: 3.3 mmol/L — ABNORMAL LOW (ref 3.5–5.1)
Sodium: 129 mmol/L — ABNORMAL LOW (ref 135–145)

## 2015-01-19 LAB — CBC
HCT: 32.9 % — ABNORMAL LOW (ref 39.0–52.0)
Hemoglobin: 10.9 g/dL — ABNORMAL LOW (ref 13.0–17.0)
MCH: 28.1 pg (ref 26.0–34.0)
MCHC: 33.1 g/dL (ref 30.0–36.0)
MCV: 84.8 fL (ref 78.0–100.0)
PLATELETS: 162 10*3/uL (ref 150–400)
RBC: 3.88 MIL/uL — ABNORMAL LOW (ref 4.22–5.81)
RDW: 13.9 % (ref 11.5–15.5)
WBC: 9.3 10*3/uL (ref 4.0–10.5)

## 2015-01-19 LAB — GLUCOSE, CAPILLARY
GLUCOSE-CAPILLARY: 187 mg/dL — AB (ref 65–99)
Glucose-Capillary: 164 mg/dL — ABNORMAL HIGH (ref 65–99)

## 2015-01-19 NOTE — Progress Notes (Signed)
Pt. Walked 4400ft. With walker and no o2 saturation stayed above 91%

## 2015-01-19 NOTE — Progress Notes (Signed)
Patient ambulated 150 ft with 1 assist using a rollator on 2 L of oxygen. Patient tolerated well no c/o, VSS.

## 2015-01-19 NOTE — Discharge Summary (Signed)
Physician Discharge Summary  Patient ID: Craig Harvey MRN: [id number] DOB/AGE: 57-Mar-1957 57 y.o.  Admit date: 01/09/2015 Discharge date: 01/23/2015  Admission Diagnoses:  Patient Active Problem List   Diagnosis Date Noted  . 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 CAD, multiple vessel 01/10/2015  . Unstable angina 01/09/2015  . Crescendo angina   . Chest pain on exertion   . Type 2 diabetes mellitus without complication   . OSA (obstructive sleep apnea)   . Morbid obesity   . Abnormal EKG   . Chest pain, central 01/05/2015   Discharge Diagnoses:   Patient Active Problem List   Diagnosis Date Noted  . 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 CAD, multiple vessel 01/10/2015  . Unstable angina 01/09/2015  . Crescendo angina   . Chest pain on exertion   . Type 2 diabetes mellitus without complication   . OSA (obstructive sleep apnea)   . Morbid obesity   . Abnormal EKG   . Chest pain, central 01/05/2015   Discharged Condition: good  History Present Illness:  Craig Harvey is a 59 yo African American male with known history of HTN.  He presented to OSH ED with complaints of nausea, feeling hot, and chest pain with radiation into his left shoulder.  He states about 1 month ago his lifted heavy sacks of newspaper about 100-150 lbs each.  He thinks his pain developed at that time.  The patient also noticed similar symptoms when lifting weights.  The patient is a hoarder and has minimal room to move around his room.  His home also has little air conditioning.  The patient got hot while at home with associated nausea and felt like he was going to pass out.  This prompted the patient to present for evaluation.  EKG was unremarkable in ED and Cardiac enzymes were negative.  He was evaluated by Dr. Mariah Milling who felt exercise stress test should be done to evaluate for evidence of ischemia.  This was done and was positive.  Therefore, he underwent cardiac catheterization which showed 3 vessel CAD.  It was felt CABG  would be indicated and the patient was transferred to [facility] for further care.      Hospital Course:   The patient arrived at [facility] on Heparin.  TCTS consult was obtained.  He was evaluated by Dr. Donata Clay who felt the patient would benefit from coronary bypass procedure.  The patient had intermittent chest pain during his admission.  He was felt medically stable for surgery and was taken to the operating room on 01/14/2015.  He underwent CABG x 3 utilizing LIMA to LAD, SVG to OM, and SVG to Ramus.  He also underwent endoscopic harvest of the greater saphenous vein from the right leg.  He tolerated the procedure without difficulty.  He was extubated the evening of surgery.  During his stay in the SICU he was weaned off all drips as tolerated.  His blood pressure was elevated and his medications were adjusted accordingly.  His chest tubes and arterial lines were removed without difficulty.  He developed Atrial Fibrillation and was treated with IV Amiodarone.  He successfully converted to NSR.  He was diuresed with Lasix with good results.  He was ambulating in the SICU without difficulty.  He was transferred to the stepdown unit instable condition.  The patient has continued to progress.  He is maintaining NSR and his pacing wires were removed without difficulty.  His preoperative A1c was 10.0.  His sugars have been controlled with insulin and Metformin.  Per diabetes coordinator recommendation, he will be discharged on Novolin 70/30 17 units bid. He was educated on the importance of strict dietary adherence.  He will need to follow up with PCP for further adjustment of diabetes medications.  He continues to ambulate without difficulty.  His QTc was elevated so his Amiodarone was decreased to 200 mg daily. His QTc was over 520 on 07/20. He continued to maintain SR. As a result, I stopped his Amiodarone. He does continue to desat with ambulation. He was on 2 liters of oxygen for several days post op. He does  not have insurance and was unable to pay for home oxygen. As a result, he had to stay in the hospital until he was weaned off oxygen. He is felt surgically stable for discharge today.          Significant Diagnostic Studies: angiography:   Right dominant coronary arterial system Severe two-vessel disease: Ostial LAD estimated at 70-80%, eccentric calcification, LAD is a large vessel Also with critical proximal LAD lesion estimated at 95-99%. Proximal circumflex with severe 95% lesion after the takeoff of OM1, long, calcified, circumflex is a large vessel Mid circumflex also with 80% lesion, focal, prior to the takeoff of OM 2 Right coronary artery with mild diffuse disease  Treatments: surgery:   1. Coronary artery bypass grafting x3 (left internal mammary artery to  the distal left anterior descending, saphenous vein graft to  circumflex, saphenous vein graft to ramus intermediate). 2. Endoscopic harvest of right leg greater saphenous vein  Disposition: Stable and discharged to home       Discharge Instructions    Amb Referral to Cardiac Rehabilitation    Complete by:  As directed   Congestive Heart Failure: If diagnosis is Heart Failure, patient MUST meet each of the CMS criteria: 1. Left Ventricular Ejection Fraction </= 35% 2. NYHA class II-IV symptoms despite being on optimal heart failure therapy for at least 6 weeks. 3. Stable = have not had a recent (<6 weeks) or planned (<6 months) major cardiovascular hospitalization or procedure  Program Details: - Physician supervised classes - 1-3 classes per week over a 12-18 week period, generally for a total of 36 sessions  Physician Certification: I certify that the above Cardiac Rehabilitation treatment is medically necessary and is medically approved by me for treatment of this patient. The patient is willing and cooperative, able to ambulate and medically stable to participate in exercise rehabilitation. The  participant's progress and Individualized Treatment Plan will be reviewed by the Medical Director, Cardiac Rehab staff and as indicated by the Referring/Ordering Physician.  Diagnosis:  CABG            Medication List    STOP taking these medications        amLODipine 5 MG tablet  Commonly known as:  NORVASC     YOHIMBE PO      TAKE these medications        acetaminophen 650 MG CR tablet  Commonly known as:  TYLENOL  Take 1,300 mg by mouth daily as needed for pain.     aspirin 325 MG EC tablet  Take 1 tablet (325 mg total) by mouth daily.     atorvastatin 80 MG tablet  Commonly known as:  LIPITOR  Take 1 tablet (80 mg total) by mouth daily at 6 PM.     insulin NPH-regular Human (70-30) 100 UNIT/ML injection  Commonly known as:  NOVOLIN 70/30 RELION  Inject 17 Units into the skin 2 (two) times daily with a meal.     INSULIN SYRINGE .3CC/31GX5/16" 31G X 5/16" 0.3 ML Misc  Please dispense appropriate syringes for administration of Novolin 70/30     metFORMIN 1000 MG tablet  Commonly known as:  GLUCOPHAGE  Take 1 tablet (1,000 mg total) by mouth 2 (two) times daily with a meal.     metoprolol succinate 25 MG 24 hr tablet  Commonly known as:  TOPROL-XL  Take 1 tablet (25 mg total) by mouth daily.     oxyCODONE 5 MG immediate release tablet  Commonly known as:  Oxy IR/ROXICODONE  Take 1-2 tablets (5-10 mg total) by mouth every 4 (four) hours as needed for severe pain.       The patient has been discharged on:   1.Beta Blocker:  Yes [ x  ]                              No   [   ]                              If No, reason:  2.Ace Inhibitor/ARB: Yes [   ]                                     No  [  x  ]                                     If No, reason: labile blood pressure  3.Statin:   Yes [x   ]                  No  [   ]                  If No, reason:  4.Ecasa:  Yes  [ x  ]                  No   [   ]                  If No, reason:     Follow-up  Information    Follow up with Kerin Perna III, MD In 4 weeks.   Specialty:  Cardiothoracic Surgery   Why:  PA/LAT CXR to be taken (at [facility] which is in the same building as Dr. Zenaida Niece Trigt's office) on 02/17/2015 at 12:15 pm;Appointment time is on 02/17/2015 at 1:00 pm   Contact information:   [address] [employer] [address] [place] [address] [phone]       Follow up with Julien Nordmann, MD.   Specialty:  Cardiology   Why:  Call for a follow up appointment for 2 weeks   Contact information:   [address] [place] [address] [phone]       Follow up with Medical Doctor.   Why:  Obtain a medical doctor for further surveillance of HGA1C 10.1 and diabetes management      Signed: Ardelle Balls PA-C 01/23/2015, 7:38 AM

## 2015-01-19 NOTE — Progress Notes (Signed)
Pt is able to place himself on CPAP when ready. Pt encouraged to call RT if pt needs any assistance

## 2015-01-19 NOTE — Progress Notes (Signed)
dc'ed pacing wires pt. tolerated well 

## 2015-01-19 NOTE — Progress Notes (Addendum)
      [address]       [place] [id number]             [phone]      5 Days Post-Op Procedure(s) (LRB): CORONARY ARTERY BYPASS GRAFTING (CABG), ON PUMP, TIMES THREE, USING LEFT INTERNAL MAMMARY ARTERY, RIGHT GREATER SAPHENOUS VEIN HARVESTED ENDOSCOPICALLY (N/A) TRANSESOPHAGEAL ECHOCARDIOGRAM (TEE) (N/A)   Subjective:  Mr. Cathlean CowerBaldwin has no complaints this morning.  He was ambulating in the hallway this morning without difficulty.  Objective: Vital signs in last 24 hours: Temp:  [97.5 F (36.4 C)-97.8 F (36.6 C)] 97.8 F (36.6 C) (07/17 0350) Pulse Rate:  [66-82] 73 (07/17 0350) Cardiac Rhythm:  [-] Normal sinus rhythm (07/17 0803) Resp:  [18-25] 18 (07/17 0350) BP: (104-135)/(58-76) 104/58 mmHg (07/17 0350) SpO2:  [92 %-97 %] 97 % (07/17 0350) Weight:  [314 lb 6 oz (142.6 kg)] 314 lb 6 oz (142.6 kg) (07/17 0203)  Intake/Output from previous day: 07/16 0701 - 07/17 0700 In: 550.1 [P.O.:480; I.V.:70.1] Out: 3900 [Urine:3900]  General appearance: alert, cooperative and no distress Heart: regular rate and rhythm Lungs: clear to auscultation bilaterally Abdomen: soft, non-tender; bowel sounds normal; no masses,  no organomegaly Extremities: edema trace Wound: clean and dry  Lab Results:  Recent Labs  01/18/15 0510 01/19/15 0311  WBC 11.1* 9.3  HGB 11.3* 10.9*  HCT 33.2* 32.9*  PLT 164 162   BMET:  Recent Labs  01/18/15 0510 01/19/15 0311  NA 129* 129*  K 3.5 3.3*  CL 88* 86*  CO2 32 32  GLUCOSE 175* 156*  BUN 17 20  CREATININE 1.11 1.14  CALCIUM 8.7* 8.9    PT/INR: No results for input(s): LABPROT, INR in the last 72 hours. ABG    Component Value Date/Time   PHART 7.363 01/14/2015 1920   HCO3 23.2 01/14/2015 1920   TCO2 22 01/15/2015 1652   ACIDBASEDEF 2.0 01/14/2015 1920   O2SAT 95.0 01/14/2015 1920   CBG (last 3)   Recent Labs  01/18/15 0833 01/18/15 1213 01/18/15 2049  GLUCAP 146* 149* 223*    Assessment/Plan: S/P  Procedure(s) (LRB): CORONARY ARTERY BYPASS GRAFTING (CABG), ON PUMP, TIMES THREE, USING LEFT INTERNAL MAMMARY ARTERY, RIGHT GREATER SAPHENOUS VEIN HARVESTED ENDOSCOPICALLY (N/A) TRANSESOPHAGEAL ECHOCARDIOGRAM (TEE) (N/A)  1. CV- previous A. Fib, maintaining NSR- continue Amiodarone, Lopressor 2. Pulm- wean oxygen as tolerated, continue IS 3. Renal- creatinine stable, weight is below baseline on lasix  4. DM- sugars are erratic, A1c was 10.0 preop, on Levemir and Metformin currently 5. Dispo- patient stable, maintaining NSR, d/c epw today, likely home in Am   LOS: 10 days    BARRETT, ERIN 01/19/2015  I have seen and examined the patient and agree with the assessment and plan as outlined.  Doing well.  Possibly ready for d/c home tomorrow.  Purcell NailsClarence H Khamil Lamica 01/19/2015 12:07 PM

## 2015-01-20 LAB — GLUCOSE, CAPILLARY
GLUCOSE-CAPILLARY: 177 mg/dL — AB (ref 65–99)
GLUCOSE-CAPILLARY: 141 mg/dL — AB (ref 65–99)
Glucose-Capillary: 188 mg/dL — ABNORMAL HIGH (ref 65–99)
Glucose-Capillary: 168 mg/dL — ABNORMAL HIGH (ref 65–99)
Glucose-Capillary: 112 mg/dL — ABNORMAL HIGH (ref 65–99)
Glucose-Capillary: 173 mg/dL — ABNORMAL HIGH (ref 65–99)
Glucose-Capillary: 155 mg/dL — ABNORMAL HIGH (ref 65–99)

## 2015-01-20 MED ORDER — FUROSEMIDE 40 MG PO TABS: 40.0000 mg | ORAL_TABLET | Freq: Every day | ORAL | Status: DC

## 2015-01-20 MED ORDER — ASPIRIN 325 MG PO TBEC: 325.0000 mg | DELAYED_RELEASE_TABLET | Freq: Every day | ORAL | Status: DC

## 2015-01-20 MED ORDER — FUROSEMIDE 40 MG PO TABS
40.0000 mg | ORAL_TABLET | Freq: Every day | ORAL | Status: DC
  Administered 2015-01-21 – 2015-01-22 (×2): 40 mg via ORAL
  Filled 2015-01-20 (×3): qty 1

## 2015-01-20 MED ORDER — OXYCODONE HCL 5 MG PO TABS: 5.0000 mg | ORAL_TABLET | ORAL | Status: DC | PRN

## 2015-01-20 MED ORDER — POTASSIUM CHLORIDE CRYS ER 20 MEQ PO TBCR
20.0000 meq | EXTENDED_RELEASE_TABLET | Freq: Every day | ORAL | Status: DC
  Administered 2015-01-20 – 2015-01-21 (×2): 20 meq via ORAL
  Filled 2015-01-20 (×3): qty 1

## 2015-01-20 MED ORDER — POTASSIUM CHLORIDE CRYS ER 20 MEQ PO TBCR: 20.0000 meq | EXTENDED_RELEASE_TABLET | Freq: Every day | ORAL | Status: DC

## 2015-01-20 MED ORDER — AMIODARONE HCL 400 MG PO TABS: 400.0000 mg | ORAL_TABLET | Freq: Two times a day (BID) | ORAL | Status: DC

## 2015-01-20 MED ORDER — ATORVASTATIN CALCIUM 80 MG PO TABS: 80.0000 mg | ORAL_TABLET | Freq: Every day | ORAL | Status: DC

## 2015-01-20 NOTE — Discharge Instructions (Signed)
Activity: 1.May walk up steps °               2.No lifting more than ten pounds for four weeks.  °               3.No driving for four weeks. °               4.Stop any activity that causes chest pain, shortness of breath, dizziness, sweating or excessive weakness. °               5.Avoid straining. °               6.Continue with your breathing exercises daily. ° °Diet: Diabetic diet and Low fat, Low salt  diet ° °Wound Care: May shower.  Clean wounds with mild soap and water daily. Contact the office at [phone] if any problems arise. ° °Coronary Artery Bypass Grafting, Care After °Refer to this sheet in the next few weeks. These instructions provide you with information on caring for yourself after your procedure. Your health care provider may also give you more specific instructions. Your treatment has been planned according to current medical practices, but problems sometimes occur. Call your health care provider if you have any problems or questions after your procedure. °WHAT TO EXPECT AFTER THE PROCEDURE °Recovery from surgery will be different for everyone. Some people feel well after 3 or 4 weeks, while for others it takes longer. After your procedure, it is typical to have the following: °· Nausea and a lack of appetite.   °· Constipation. °· Weakness and fatigue.   °· Depression or irritability.   °· Pain or discomfort at your incision site. °HOME CARE INSTRUCTIONS °· Take medicines only as directed by your health care provider. Do not stop taking medicines or start any new medicines without first checking with your health care provider. °· Take your pulse as directed by your health care provider. °· Perform deep breathing as directed by your health care provider. If you were given a device called an incentive spirometer, use it to practice deep breathing several times a day. Support your chest with a pillow or your arms when you take deep breaths or cough. °· Keep incision areas clean, dry, and  protected. Remove or change any bandages (dressings) only as directed by your health care provider. You may have skin adhesive strips over the incision areas. Do not take the strips off. They will fall off on their own. °· Check incision areas daily for any swelling, redness, or drainage. °· If incisions were made in your legs, do the following: °¨ Avoid crossing your legs.   °¨ Avoid sitting for long periods of time. Change positions every 30 minutes.   °¨ Elevate your legs when you are sitting. °· Wear compression stockings as directed by your health care provider. These stockings help keep blood clots from forming in your legs. °· Take showers once your health care provider approves. Until then, only take sponge baths. Pat incisions dry. Do not rub incisions with a washcloth or towel. Do not take baths, swim, or use a hot tub until your health care provider approves. °· Eat foods that are high in fiber, such as raw fruits and vegetables, whole grains, beans, and nuts. Meats should be lean cut. Avoid canned, processed, and fried foods. °· Drink enough fluid to keep your urine clear or pale yellow. °· Weigh yourself every day. This helps identify if you are retaining fluid that may make your heart and   lungs work harder. °· Rest and limit activity as directed by your health care provider. You may be instructed to: °¨ Stop any activity at once if you have chest pain, shortness of breath, irregular heartbeats, or dizziness. Get help right away if you have any of these symptoms. °¨ Move around frequently for short periods or take short walks as directed by your health care provider. Increase your activities gradually. You may need physical therapy or cardiac rehabilitation to help strengthen your muscles and build your endurance. °¨ Avoid lifting, pushing, or pulling anything heavier than 10 lb (4.5 kg) for at least 6 weeks after surgery. °· Do not drive until your health care provider approves.  °· Ask your health  care provider when you may return to work. °· Ask your health care provider when you may resume sexual activity. °· Keep all follow-up visits as directed by your health care provider. This is important. °SEEK MEDICAL CARE IF: °· You have swelling, redness, increasing pain, or drainage at the site of an incision. °· You have a fever. °· You have swelling in your ankles or legs. °· You have pain in your legs.   °· You gain 2 or more pounds (0.9 kg) a day. °· You are nauseous or vomit. °· You have diarrhea.  °SEEK IMMEDIATE MEDICAL CARE IF: °· You have chest pain that goes to your jaw or arms. °· You have shortness of breath.   °· You have a fast or irregular heartbeat.   °· You notice a "clicking" in your breastbone (sternum) when you move.   °· You have numbness or weakness in your arms or legs. °· You feel dizzy or light-headed.   °MAKE SURE YOU: °· Understand these instructions. °· Will watch your condition. °· Will get help right away if you are not doing well or get worse. °Document Released: 01/08/2005 Document Revised: 11/05/2013 Document Reviewed: 11/28/2012 °ExitCare® Patient Information ©2015 [employer]. This information is not intended to replace advice given to you by your health care provider. Make sure you discuss any questions you have with your health care provider. ° ° °Endoscopic Saphenous Vein Harvesting °Care After °Refer to this sheet in the next few weeks. These instructions provide you with information on caring for yourself after your procedure. Your health care provider may also give you more specific instructions. Your treatment has been planned according to current medical practices, but problems sometimes occur. Call your health care provider if you have any problems or questions after your procedure. °HOME CARE INSTRUCTIONS °Medicine °Take whatever pain medicine your surgeon prescribes. Follow the directions carefully. Do not take over-the-counter pain medicine unless your surgeon says  it is okay. Some pain medicine can cause bleeding problems for several weeks after surgery. °Follow your surgeon's instructions about driving. You will probably not be permitted to drive after heart surgery. °Take any medicines your surgeon prescribes. Any medicines you took before your heart surgery should be checked with your health care provider before you start taking them again. °Wound care °If your surgeon has prescribed an elastic bandage or stocking, ask how long you should wear it. °Check the area around your surgical cuts (incisions) whenever your bandages (dressings) are changed. Look for any redness or swelling. °You will need to return to have the stitches (sutures) or staples taken out. Ask your surgeon when to do that. °Ask your surgeon when you can shower or bathe. °Activity °Try to keep your legs raised when you are sitting. °Do any exercises your health care providers have given   you. These may include deep breathing exercises, coughing, walking, or other exercises. °SEEK MEDICAL CARE IF: °You have any questions about your medicines. °You have more leg pain, especially if your pain medicine stops working. °New or growing bruises develop on your leg. °Your leg swells, feels tight, or becomes red. °You have numbness in your leg. °SEEK IMMEDIATE MEDICAL CARE IF: °Your pain gets much worse. °Blood or fluid leaks from any of the incisions. °Your incisions become warm, swollen, or red. °You have chest pain. °You have trouble breathing. °You have a fever. °You have more pain near your leg incision. °MAKE SURE YOU: °Understand these instructions. °Will watch your condition. °Will get help right away if you are not doing well or get worse. °Document Released: 03/03/2011 Document Revised: 06/26/2013 Document Reviewed: 03/03/2011 °ExitCare® Patient Information ©2015 [employer]. This information is not intended to replace advice given to you by your health care provider. Make sure you discuss any questions  you have with your health care provider. ° °

## 2015-01-20 NOTE — Progress Notes (Addendum)
CARDIAC REHAB PHASE I   PRE:  Rate/Rhythm: 80 SR  BP:  Lying: 120/54        SaO2: 92 2L, 88 on RA  MODE:  Ambulation: 450 ft   POST:  Rate/Rhythm: 102 ST  BP:  Sitting: 119/60         SaO2: 92 2L  Pt ambulated 450 ft on 2L O2 (pt oxygen dropped to 88% on RA at rest), rolling walker, steady gait, standby assist, tolerated well. Pt denies CP, dizziness, DOE, declined rest stop. [facility] discharge education completed. Reviewed IS, sternal precautions, activity progression, exercise, risk factors, diet including heart healthy, carb counting, sodium restrictions, and phase 2 cardiac rehab. Pt verbalized understanding. Pt agrees to phase 2 cardiac rehab. [facility]  Pt does not have insurance, may need financial assistance if eligible.  Pt also states he will be staying with his niece at discharge where the bedroom and bathroom are on the second floor. [facility] to [facility] and a rolling walker for mobility. Case manager notified. Pt watching cardiac surgery discharge video, in recliner, call light within reach.   SATURATION QUALIFICATIONS: (This note is used to comply with regulatory documentation for home oxygen)  Patient Saturations on Room Air at Rest = 88%  Patient Saturations on Room Air while Ambulating = N/A  Patient Saturations on 2 Liters of oxygen while Ambulating = 92%  Please briefly explain why patient needs home oxygen: pt requiring oxygen to maintain O2 sats at rest.    [id number]   Joylene GrapesMonge, Nicholad Kautzman C, RN, BSN 01/20/2015 9:23 AM

## 2015-01-20 NOTE — Progress Notes (Signed)
Pt given d/c instructions; pt verbalized understanding; pt awaiting home O2 and equipment and to discuss with CM assistance with medication and obtaining PCP; pt to d/c home with niece; will cont. To monitor.

## 2015-01-20 NOTE — Progress Notes (Signed)
PA made aware of inability for pt to pay for home O2; d/c to be cancelled today; will attempt to wean pt to RA; flutter valve and IS encouraged; will cont. To monitor.

## 2015-01-20 NOTE — Progress Notes (Signed)
Pt ambulated 300 ft 1 assist with walker. 88-90% o2 on room air, got up to 94% on 3L. Tolerated well, VSS, no complaints at this time. Pt back in bed on 2L 02 nasal cannula at 92% with call bell within reach.   Niyati Heinke, RN

## 2015-01-20 NOTE — Progress Notes (Signed)
Spoke with niece; pt to d/c home with niece; niece to arrive at hospital about 1:15; pt still awaiting O2 for home and home equipment; will cont. To monitor.

## 2015-01-20 NOTE — Care Management Note (Addendum)
Case Management Note CM note started by Craig ArenasSarah Harvey RNCM  Patient Details  Name: Craig Harvey MRN: [id number] Date of Birth: [birth year]  Subjective/Objective:      Independent prior to admission - lived with Mom.  Anticipate discharge home with mom and brother.               Action/Plan: Pt for d/c home today with mom- spoke with pt regarding d/c needs, order for home 02- [facility]- pt meets 02 sat qualification however does not have a qualifying dx- Craig Harvey to come speak with pt regarding paying out of pocket- RW and BSC to be delivered to room prior to d/c (per Craig Harvey pt did not want to pay out of pocket for 02 or BSC). In speaking with pt - and reviewing meds- pt reports that he has concernes about affording medications (in particular Amio and Lipitor) pt does qualify [facility] - program explained and letter for medication assistance given to pt- pt also states that he was going to get established at the [facility] in [place] - and prefers to have PCP close to home- printed info on [facility] for pt along with [facility] once pt has established a PCP.   Expected Discharge Date:       01/20/15           Expected Discharge Plan:  Home/Self Care  In-House Referral:     Discharge planning Services  CM Consult, MATCH Program, Medication Assistance, Indigent Health Clinic  Post Acute Care Choice:    Choice offered to:     DME Arranged:  Bedside commode, Walker rolling, Oxygen DME Agency:  [facility]  [facility]:     Status of Service:  Completed, signed off  Medicare Important Message Given:    Date Medicare IM Given:    Medicare IM give by:    Date Additional Medicare IM Given:    Additional Medicare Important Message give by:     If discussed at Long Length of Stay Meetings, dates discussed:  01/21/15  Additional Comments:  Craig SpanWebster, Craig Harvey Hall,  [phone]--[phone] 01/20/2015, 2:47 PM

## 2015-01-20 NOTE — Progress Notes (Signed)
Chest tube sutures d/c at this time per MD order; steri strips applied to site; will cont. To monitor.

## 2015-01-20 NOTE — Progress Notes (Addendum)
      [address]       [place] [id number]             [phone]        6 Days Post-Op Procedure(s) (LRB): CORONARY ARTERY BYPASS GRAFTING (CABG), ON PUMP, TIMES THREE, USING LEFT INTERNAL MAMMARY ARTERY, RIGHT GREATER SAPHENOUS VEIN HARVESTED ENDOSCOPICALLY (N/A) TRANSESOPHAGEAL ECHOCARDIOGRAM (TEE) (N/A)  Subjective: Patient eating breakfast. He has no complaints. He hopes to go home.  Objective: Vital signs in last 24 hours: Temp:  [98.3 F (36.8 C)-99 F (37.2 C)] 98.6 F (37 C) (07/18 0449) Pulse Rate:  [76-89] 83 (07/18 0449) Cardiac Rhythm:  [-] Normal sinus rhythm (07/17 2000) Resp:  [17-20] 18 (07/18 0449) BP: (98-133)/(36-85) 110/56 mmHg (07/18 0459) SpO2:  [88 %-96 %] 93 % (07/18 0459) Weight:  [308 lb 9.6 oz (139.98 kg)] 308 lb 9.6 oz (139.98 kg) (07/18 0449)  Pre op weight 144 kg Current Weight  01/20/15 308 lb 9.6 oz (139.98 kg)      Intake/Output from previous day: 07/17 0701 - 07/18 0700 In: 480 [P.O.:480] Out: 3375 [Urine:3375]   Physical Exam:  Cardiovascular: RRR Pulmonary: Clear to auscultation bilaterally; no rales, wheezes, or rhonchi. Abdomen: Soft, non tender, bowel sounds present. Extremities: Mild bilateral lower extremity edema. Ecchymosis right thigh. Wounds: Clean and dry.  No erythema or signs of infection.  Lab Results: CBC: Recent Labs  01/18/15 0510 01/19/15 0311  WBC 11.1* 9.3  HGB 11.3* 10.9*  HCT 33.2* 32.9*  PLT 164 162   BMET:  Recent Labs  01/18/15 0510 01/19/15 0311  NA 129* 129*  K 3.5 3.3*  CL 88* 86*  CO2 32 32  GLUCOSE 175* 156*  BUN 17 20  CREATININE 1.11 1.14  CALCIUM 8.7* 8.9    PT/INR:  Lab Results  Component Value Date   INR 1.65* 01/14/2015   INR 1.31 01/14/2015   ABG:  INR: Will add last result for INR, ABG once components are confirmed Will add last 4 CBG results once components are confirmed  Assessment/Plan:  1. CV - Previous a fib. Maintaining SR in the  70's. On Amiodarone 400 mg bid, Toprol XL 25 mg daily 2.  Pulmonary - History of OSA (on CPAP at night).On 2 liters of oxygen via [place] as he desat's with ambulation. May need home oxygen.Encourage incentive spirometer 3.  Acute blood loss anemia - H and H yesterday 10.9 and 32.9 4. DM-CBGs 223/164/187. On Metformin 1000 mg bid and Insulin. Pre op HGA1C 10.1. Will need follow up with medical doctor after discharge. 5. Will discuss discharge disposition with surgeon, but likely discharge later today  [name],DONIELLE MPA-C 01/20/2015,7:41 AM  patient examined and medical record reviewed,agree with above note.Home with O2 at 2 L/min and HHN for restorative care [name] 01/20/2015

## 2015-01-21 LAB — GLUCOSE, CAPILLARY
GLUCOSE-CAPILLARY: 186 mg/dL — AB (ref 65–99)
Glucose-Capillary: 142 mg/dL — ABNORMAL HIGH (ref 65–99)
Glucose-Capillary: 137 mg/dL — ABNORMAL HIGH (ref 65–99)
Glucose-Capillary: 125 mg/dL — ABNORMAL HIGH (ref 65–99)

## 2015-01-21 MED ORDER — AMIODARONE HCL 200 MG PO TABS
200.0000 mg | ORAL_TABLET | Freq: Two times a day (BID) | ORAL | Status: DC
Start: 2015-01-21 — End: 2015-01-21

## 2015-01-21 MED ORDER — ATORVASTATIN CALCIUM 80 MG PO TABS: 80.0000 mg | ORAL_TABLET | Freq: Every day | ORAL | Status: DC

## 2015-01-21 MED ORDER — AMIODARONE HCL 200 MG PO TABS: 400.0000 mg | ORAL_TABLET | Freq: Every day | ORAL | Status: DC

## 2015-01-21 MED ORDER — "INSULIN SYRINGE 31G X 5/16"" 0.3 ML MISC": Status: DC

## 2015-01-21 MED ORDER — AMIODARONE HCL 400 MG PO TABS: 400.0000 mg | ORAL_TABLET | Freq: Two times a day (BID) | ORAL | Status: DC

## 2015-01-21 MED ORDER — AMIODARONE HCL 200 MG PO TABS
200.0000 mg | ORAL_TABLET | Freq: Every day | ORAL | Status: DC
  Administered 2015-01-21: 200 mg via ORAL
  Filled 2015-01-21 (×2): qty 1

## 2015-01-21 MED ORDER — INSULIN NPH ISOPHANE & REGULAR (70-30) 100 UNIT/ML ~~LOC~~ SUSP: 17.0000 [IU] | Freq: Two times a day (BID) | SUBCUTANEOUS | Status: DC

## 2015-01-21 MED ORDER — INSULIN DETEMIR 100 UNIT/ML ~~LOC~~ SOLN
34.0000 [IU] | Freq: Two times a day (BID) | SUBCUTANEOUS | Status: DC
  Administered 2015-01-21 – 2015-01-23 (×5): 34 [IU] via SUBCUTANEOUS
  Filled 2015-01-21 (×7): qty 0.34

## 2015-01-21 MED ORDER — AMIODARONE HCL 200 MG PO TABS: 200.0000 mg | ORAL_TABLET | Freq: Every day | ORAL | Status: DC

## 2015-01-21 NOTE — Progress Notes (Signed)
01/21/2015 11:09 AM Nursing note At rest in bed pt. o2 saturation on room air 88%. Placed back on 2L [place]. Doree Fudgeonielle [name] [facility] made aware. Orders received no d/c today. Pt. Updated on plan of care. Pt. Verbalized understanding. Importance of incentive spirometry and continued ambulation re-enforced.  Brandice Busser, Blanchard KelchStephanie Ingold

## 2015-01-21 NOTE — Progress Notes (Signed)
Pt is able to place on CPAP machine when ready. Pt encouraged to call RT if needing any assistance. RN aware. No distress noted.

## 2015-01-21 NOTE — Progress Notes (Signed)
CARDIAC REHAB PHASE I   PRE:  Rate/Rhythm: 76 SR  BP:  Sitting: 120/89        SaO2: 87-88 on RA at rest  MODE:  Ambulation: 900 ft   POST:  Rate/Rhythm: 91 SR  BP:  Sitting: 133/65         SaO2: 93 2L  Pt lying in bed, oxygen saturation 87-88% on RA at rest. Pt placed on 2L O2 for ambulation. Pt ambulated 900 ft on 2L O2, steady gait, standby assist, rolling walker, tolerated very well. Pt denies CP, dizziness, DOE, declined rest stop. Pt states he does not feel tired today after ambulation. Pt sates 91-93% on 2L O2 while ambulating. Pt returned to room, requested to go to bathroom. Pt placed on 1L O2 in attempt to wean to RA, assisted to bathroom. RN notified. Will follow-up tomorrow.    [id number]  Joylene GrapesMonge, Neiman Roots C, RN, BSN 01/21/2015 11:26 AM

## 2015-01-21 NOTE — Progress Notes (Addendum)
      [address]       [place] [id number]             [phone]        7 Days Post-Op Procedure(s) (LRB): CORONARY ARTERY BYPASS GRAFTING (CABG), ON PUMP, TIMES THREE, USING LEFT INTERNAL MAMMARY ARTERY, RIGHT GREATER SAPHENOUS VEIN HARVESTED ENDOSCOPICALLY (N/A) TRANSESOPHAGEAL ECHOCARDIOGRAM (TEE) (N/A)  Subjective: Patient without complaints. Hopes to go home soon.  Objective: Vital signs in last 24 hours: Temp:  [98.1 F (36.7 C)-98.6 F (37 C)] 98.1 F (36.7 C) (07/19 0505) Pulse Rate:  [76-79] 77 (07/19 0505) Cardiac Rhythm:  [-] Normal sinus rhythm (07/18 2129) Resp:  [18-20] 18 (07/19 0505) BP: (102-130)/(52-68) 130/68 mmHg (07/19 0505) SpO2:  [92 %-95 %] 95 % (07/19 0505) Weight:  [305 lb 8.9 oz (138.6 kg)] 305 lb 8.9 oz (138.6 kg) (07/19 0505)  Pre op weight 144 kg Current Weight  01/21/15 305 lb 8.9 oz (138.6 kg)      Intake/Output from previous day: 07/18 0701 - 07/19 0700 In: 840 [P.O.:840] Out: 2675 [Urine:2675]   Physical Exam:  Cardiovascular: RRR Pulmonary: Clear to auscultation bilaterally; no rales, wheezes, or rhonchi. Abdomen: Soft, non tender, bowel sounds present. Extremities: Mild bilateral lower extremity edema. Ecchymosis right thigh. Wounds: Clean and dry.  No erythema or signs of infection.  Lab Results: CBC:  Recent Labs  01/19/15 0311  WBC 9.3  HGB 10.9*  HCT 32.9*  PLT 162   BMET:   Recent Labs  01/19/15 0311  NA 129*  K 3.3*  CL 86*  CO2 32  GLUCOSE 156*  BUN 20  CREATININE 1.14  CALCIUM 8.9    PT/INR:  Lab Results  Component Value Date   INR 1.65* 01/14/2015   INR 1.31 01/14/2015        .Assessment/Plan:  1. CV - Previous a fib. Maintaining SR in the 70's. On Amiodarone 400 mg bid, Toprol XL 25 mg daily. QTc is 500 so will decrease Amiodarone. 2.  Pulmonary - History of OSA (on CPAP at night).Was on 2 liters of oxygen via [place] as he did desat with ambulation. Encourage  incentive spirometer 3.  Acute blood loss anemia - H and H yesterday 10.9 and 32.9 4. DM-CBGs 173/155/142. On Metformin 1000 mg bid and Insulin. Pre op HGA1C 10.1. Will need follow up with medical doctor after discharge. 5. Regarding hold on discharge yesterday, he does not have insurance and is not able to pay for home oxygen. Need him to be weaned to room air prior to discharge.  [name],DONIELLE MPA-C 01/21/2015,7:34 AM   Patient staying in hospital because of no O2 at home I have seen and examined Craig Harvey and agree with the above assessment  and plan.  Delight OvensEdward B Annielee Jemmott MD [place] [phone] Office [phone] 01/21/2015 10:44 AM

## 2015-01-21 NOTE — Progress Notes (Signed)
Pt stated that he would put his cpap on when he is ready. Pt notified that RT is available for assistance if needed.

## 2015-01-22 LAB — BASIC METABOLIC PANEL
Anion gap: 13 (ref 5–15)
BUN: 24 mg/dL — ABNORMAL HIGH (ref 6–20)
CO2: 33 mmol/L — ABNORMAL HIGH (ref 22–32)
Calcium: 8.9 mg/dL (ref 8.9–10.3)
Chloride: 82 mmol/L — ABNORMAL LOW (ref 101–111)
Creatinine, Ser: 1.14 mg/dL (ref 0.61–1.24)
GFR calc Af Amer: >60 mL/min (ref >60)
GFR calc non Af Amer: >60 mL/min (ref >60)
Glucose, Bld: 121 mg/dL — ABNORMAL HIGH (ref 65–99)
Potassium: 3.3 mmol/L — ABNORMAL LOW (ref 3.5–5.1)
Sodium: 128 mmol/L — ABNORMAL LOW (ref 135–145)

## 2015-01-22 LAB — CBC
HCT: 33.5 % — ABNORMAL LOW (ref 39.0–52.0)
Hemoglobin: 11.3 g/dL — ABNORMAL LOW (ref 13.0–17.0)
MCH: 28.6 pg (ref 26.0–34.0)
MCHC: 33.7 g/dL (ref 30.0–36.0)
MCV: 84.8 fL (ref 78.0–100.0)
Platelets: 216 10*3/uL (ref 150–400)
RBC: 3.95 MIL/uL — ABNORMAL LOW (ref 4.22–5.81)
RDW: 13.5 % (ref 11.5–15.5)
WBC: 9.6 10*3/uL (ref 4.0–10.5)

## 2015-01-22 LAB — GLUCOSE, CAPILLARY
GLUCOSE-CAPILLARY: 116 mg/dL — AB (ref 65–99)
GLUCOSE-CAPILLARY: 145 mg/dL — AB (ref 65–99)
Glucose-Capillary: 113 mg/dL — ABNORMAL HIGH (ref 65–99)
Glucose-Capillary: 103 mg/dL — ABNORMAL HIGH (ref 65–99)

## 2015-01-22 MED ORDER — ATORVASTATIN CALCIUM 80 MG PO TABS: 80.0000 mg | ORAL_TABLET | Freq: Every day | ORAL | Status: DC

## 2015-01-22 MED ORDER — POTASSIUM CHLORIDE CRYS ER 20 MEQ PO TBCR
40.0000 meq | EXTENDED_RELEASE_TABLET | Freq: Once | ORAL | Status: AC
  Administered 2015-01-22: 40 meq via ORAL
  Filled 2015-01-22: qty 2

## 2015-01-22 MED ORDER — POTASSIUM CHLORIDE CRYS ER 20 MEQ PO TBCR
40.0000 meq | EXTENDED_RELEASE_TABLET | Freq: Once | ORAL | Status: AC
  Administered 2015-01-22: 40 meq via ORAL

## 2015-01-22 NOTE — Progress Notes (Signed)
Pt stated he would put on CPAP when he is ready. Pt notified that RT is available for assistance with setting it up if needed. RT will continue to monitor.

## 2015-01-22 NOTE — Progress Notes (Signed)
Patient ambulated 300 ft with rolling walker on room air, oxygen saturation level fluctuated between 89 and 90. Patient experience on sob and did not require a rest period. Patient returned to room and placed on 1 liter of O2 via nasal cannula, saturation level return to 92%. Call bell in reach, will continue to monitor.

## 2015-01-22 NOTE — Progress Notes (Addendum)
      [address]       [place] [id number]             [phone]        8 Days Post-Op Procedure(s) (LRB): CORONARY ARTERY BYPASS GRAFTING (CABG), ON PUMP, TIMES THREE, USING LEFT INTERNAL MAMMARY ARTERY, RIGHT GREATER SAPHENOUS VEIN HARVESTED ENDOSCOPICALLY (N/A) TRANSESOPHAGEAL ECHOCARDIOGRAM (TEE) (N/A)  Subjective: Patient without complaints. Hopes to go home soon.  Objective: Vital signs in last 24 hours: Temp:  [97.5 F (36.4 C)-98.6 F (37 C)] 98.6 F (37 C) (07/20 0255) Pulse Rate:  [77-82] 77 (07/20 0255) Cardiac Rhythm:  [-] Normal sinus rhythm (07/20 0740) Resp:  [18-19] 18 (07/20 0255) BP: (96-138)/(56-71) 96/56 mmHg (07/20 0255) SpO2:  [93 %] 93 % (07/20 0255) Weight:  [305 lb 3.2 oz (138.438 kg)] 305 lb 3.2 oz (138.438 kg) (07/20 0255)  Pre op weight 144 kg Current Weight  01/22/15 305 lb 3.2 oz (138.438 kg)      Intake/Output from previous day: 07/19 0701 - 07/20 0700 In: 1320 [P.O.:1320] Out: 2475 [Urine:2475]   Physical Exam:  Cardiovascular: RRR Pulmonary: Clear to auscultation bilaterally; no rales, wheezes, or rhonchi. Abdomen: Soft, non tender, bowel sounds present. Extremities: No lower extremity edema. Ecchymosis right thigh. Wounds: Clean and dry.  No erythema or signs of infection.  Lab Results: CBC:  Recent Labs  01/22/15 0325  WBC 9.6  HGB 11.3*  HCT 33.5*  PLT 216   BMET:   Recent Labs  01/22/15 0325  NA 128*  K 3.3*  CL 82*  CO2 33*  GLUCOSE 121*  BUN 24*  CREATININE 1.14  CALCIUM 8.9    PT/INR:  Lab Results  Component Value Date   INR 1.65* 01/14/2015   INR 1.31 01/14/2015        .Assessment/Plan:  1. CV - Previous a fib. Maintaining SR in the 70's. On Amiodarone 200 mg daily, Toprol XL 25 mg daily.  QTc is above 520. He is maintaining SR. I am going to stop Amiodarone. 2.  Pulmonary - History of OSA (on CPAP at night).Was on 2 liters of oxygen via [place] and now to down to 1 liter  of oxygen. Encourage incentive spirometer 3.  Acute blood loss anemia - H and H stable at 11.3 and 33.5 4. DM-CBGs 125/186/116. On Metformin 1000 mg bid and Insulin. Pre op HGA1C 10.1. Will need follow up with medical doctor after discharge. 5. Supplement potassium 6. Stop diuresis after today 7. Regarding hold on discharge, he does not have insurance and is not able to pay for home oxygen. Need him to be weaned to room air prior to discharge.  [name],DONIELLE MPA-C 01/22/2015,8:44 AM   O2 sats staying close to 90% with ambulation Should be ready for DC home in am patient examined and medical record reviewed,agree with above note. [name] 01/22/2015

## 2015-01-22 NOTE — Progress Notes (Signed)
CARDIAC REHAB PHASE I   PRE:  Rate/Rhythm: 90 SR  BP:  Supine:   Sitting: 122/64  Standing:    SaO2: 91%RA  MODE:  Ambulation: 600 ft   POST:  Rate/Rhythm: 95 SR  BP:  Supine:   Sitting: 134/52  Standing:    SaO2: 88-92%RA 1402-1424 Monitored sats whole walk and dropped to 88% a couple of times but quickly to 92% with pursed lip breathing. Pt did not need to rest and tolerated well. Said he felt more tired than SOB. Using IS and flutter valve and this is third walk. Sats were 90-92% a large percentage of walk. To recliner with call bell.   Luetta Nuttingharlene Nomie Buchberger, RN BSN  01/22/2015 2:24 PM

## 2015-01-22 NOTE — Progress Notes (Signed)
01/22/2015 10:41 AM Nursing note Pt. Ambulated 300 ft with RW and RN on Room air. Pt. Oxygen saturations spot checked twice during walk and both were 89% on room air. Pt. Denies SOB. Tolerated well. Encouraged use of incentive spirometer and continued ambulation today. Pt. Verbalized understanding. Will continue to monitor patient.  Tye Vigo, Blanchard KelchStephanie Ingold

## 2015-01-23 LAB — GLUCOSE, CAPILLARY
GLUCOSE-CAPILLARY: 93 mg/dL (ref 65–99)
GLUCOSE-CAPILLARY: 111 mg/dL — AB (ref 65–99)

## 2015-01-23 MED ORDER — METFORMIN HCL 1000 MG PO TABS: 1000.0000 mg | ORAL_TABLET | Freq: Two times a day (BID) | ORAL | Status: DC

## 2015-01-23 MED ORDER — METOPROLOL SUCCINATE ER 25 MG PO TB24: 25.0000 mg | ORAL_TABLET | Freq: Every day | ORAL | Status: DC

## 2015-01-23 NOTE — Anesthesia Postprocedure Evaluation (Signed)
  Anesthesia Post-op Note  Patient: Craig Harvey  Procedure(s) Performed: Procedure(s): CORONARY ARTERY BYPASS GRAFTING (CABG), ON PUMP, TIMES THREE, USING LEFT INTERNAL MAMMARY ARTERY, RIGHT GREATER SAPHENOUS VEIN HARVESTED ENDOSCOPICALLY (N/A) TRANSESOPHAGEAL ECHOCARDIOGRAM (TEE) (N/A)  Patient Location: SICU  Anesthesia Type:General  Level of Consciousness: sedated and Patient remains intubated per anesthesia plan  Airway and Oxygen Therapy: Patient remains intubated per anesthesia plan and Patient placed on Ventilator (see vital sign flow sheet for setting)  Post-op Pain: none  Post-op Assessment: Post-op Vital signs reviewed, Patient's Cardiovascular Status Stable, Respiratory Function Stable, Patent Airway and Pain level controlled              Post-op Vital Signs: stable  Last Vitals:  Filed Vitals:   01/23/15 1003  BP: 122/65  Pulse: 93  Temp:   Resp:     Complications: No apparent anesthesia complications

## 2015-01-23 NOTE — Progress Notes (Signed)
      [address]       [place] [address]             [phone]        9 Days Post-Op Procedure(s) (LRB): CORONARY ARTERY BYPASS GRAFTING (CABG), ON PUMP, TIMES THREE, USING LEFT INTERNAL MAMMARY ARTERY, RIGHT GREATER SAPHENOUS VEIN HARVESTED ENDOSCOPICALLY (N/A) TRANSESOPHAGEAL ECHOCARDIOGRAM (TEE) (N/A)  Subjective: Patient without complaints. Hopes to go home soon.  Objective: Vital signs in last 24 hours: Temp:  [98.3 F (36.8 C)-99 F (37.2 C)] 98.3 F (36.8 C) (07/21 0428) Pulse Rate:  [79-84] 79 (07/21 0428) Cardiac Rhythm:  [-] Normal sinus rhythm (07/20 2143) Resp:  [18] 18 (07/21 0428) BP: (111-134)/(52-63) 111/63 mmHg (07/21 0428) SpO2:  [92 %-94 %] 94 % (07/21 0428) Weight:  [304 lb 8 oz (138.12 kg)] 304 lb 8 oz (138.12 kg) (07/21 0423)  Pre op weight 144 kg Current Weight  01/23/15 304 lb 8 oz (138.12 kg)      Intake/Output from previous day: 07/20 0701 - 07/21 0700 In: 720 [P.O.:720] Out: 1200 [Urine:1200]   Physical Exam:  Cardiovascular: RRR Pulmonary: Clear to auscultation bilaterally; no rales, wheezes, or rhonchi. Abdomen: Soft, non tender, bowel sounds present. Extremities: No lower extremity edema. Ecchymosis right thigh. Wounds: Clean and dry.  No erythema or signs of infection.  Lab Results: CBC:  Recent Labs  01/22/15 0325  WBC 9.6  HGB 11.3*  HCT 33.5*  PLT 216   BMET:   Recent Labs  01/22/15 0325  NA 128*  K 3.3*  CL 82*  CO2 33*  GLUCOSE 121*  BUN 24*  CREATININE 1.14  CALCIUM 8.9    PT/INR:  Lab Results  Component Value Date   INR 1.65* 01/14/2015   INR 1.31 01/14/2015        .Assessment/Plan:  1. CV - Previous a fib. Maintaining SR in the 70-80's. On Toprol XL 25 mg daily.  Will not starte ACE/ARB as blood pressure still labile 2.  Pulmonary - History of OSA (on CPAP at night).Was on 2 liters of oxygen via [place] and now to down to room air. Encourage incentive spirometer and flutter  valve 3.  Acute blood loss anemia - Last H and H stable at 11.3 and 33.5 4. DM-CBGs 145/113/103. On Metformin 1000 mg bid and Insulin. Pre op HGA1C 10.1. Will need follow up with medical doctor after discharge. 5. Discharge today   Tanasha Menees MPA-C 01/23/2015,7:27 AM

## 2015-01-23 NOTE — Progress Notes (Signed)
Patient to D/C. D/C instructions given. Medication education done. Insulin education done. Patient states he understands how to draw up and inject insulin and signs and symptoms of hypoglycemia & hyperglycemia. Teach back method was used. Tele box was removed. IV was removed. Patient to D/C with niece home. Personal belongings, including wheelchair, were given to niece.  Valinda Hoar RN

## 2015-01-23 NOTE — Addendum Note (Signed)
Addendum  created 01/23/15 2233 by Kipp Brood, MD   Modules edited: Anesthesia Attestations, Notes Section   Notes Section:  File: [id number]

## 2015-01-23 NOTE — Care Management Note (Signed)
Case Management Note CM note started by Avie Arenas RNCM  Patient Details  Name: DEAVEN URWIN MRN: [id number] Date of Birth: Mar 15, 1956  Subjective/Objective:      Independent prior to admission - lived with Mom.  Anticipate discharge home with mom and brother.               Action/Plan: Pt for d/c home today with mom- spoke with pt regarding d/c needs, order for home 02- have spoken with [name] from [facility]- regarding home 02- pt meets 02 sat qualification however does not have a qualifying dx- [name] to come speak with pt regarding paying out of pocket- RW and BSC to be delivered to room prior to d/c (per [name] pt did not want to pay out of pocket for 02 or BSC). In speaking with pt - and reviewing meds- pt reports that he has concernes about affording medications (in particular Amio and Lipitor) pt does qualify for [facility] assistance - program explained and letter for medication assistance given to pt- pt also states that he was going to get established at the [facility] in [place] - and prefers to have PCP close to home- printed info on [facility] for pt along with info on [facility] once pt has established a PCP.   Expected Discharge Date:       01/20/15           Expected Discharge Plan:  Home/Self Care  In-House Referral:     Discharge planning Services  CM Consult, MATCH Program, Medication Assistance, Indigent Health Clinic  Post Acute Care Choice:    Choice offered to:     DME Arranged:  Bedside commode, Walker rolling, Oxygen DME Agency:  [facility]  HH Arranged:    [facility] Agency:     Status of Service:  Completed, signed off  Medicare Important Message Given:    Date Medicare IM Given:    Medicare IM give by:    Date Additional Medicare IM Given:    Additional Medicare Important Message give by:     If discussed at Long Length of Stay Meetings, dates discussed:  01/21/15, 01/23/15  Additional Comments:  01/23/15- per  MD note- Regarding hold on discharge, he does not have insurance and is not able to pay for home oxygen. Need him to be weaned to room air prior to discharge. Pt now able to d/c home today- 02 sats within acceptable limits- confirmed with pt that he still has MATCH letter for medication assistance- letter is still valid and explained to pt that he can still use letter for meds- (valid for 7 days and is good till 01/27/15)-   Darrold Span, [phone] 01/23/2015, 11:22 AM

## 2015-01-31 ENCOUNTER — Other Ambulatory Visit: Payer: Self-pay | Admitting: Cardiothoracic Surgery

## 2015-01-31 DIAGNOSIS — Z951 Presence of aortocoronary bypass graft: Secondary | ICD-10-CM

## 2015-02-04 ENCOUNTER — Ambulatory Visit: Payer: Self-pay | Admitting: Cardiothoracic Surgery

## 2015-02-04 ENCOUNTER — Ambulatory Visit (INDEPENDENT_AMBULATORY_CARE_PROVIDER_SITE_OTHER): Payer: Self-pay | Admitting: Physician Assistant

## 2015-02-04 ENCOUNTER — Ambulatory Visit
Admission: RE | Admit: 2015-02-04 | Discharge: 2015-02-04 | Disposition: A | Discharge status: Home / Self Care | Payer: No Typology Code available for payment source | Source: Ambulatory Visit | Attending: Cardiothoracic Surgery | Admitting: Cardiothoracic Surgery

## 2015-02-04 VITALS — BP 145/88 | HR 80 | Resp 20 | Ht 69.0 in | Wt 300.0 lb

## 2015-02-04 DIAGNOSIS — Z951 Presence of aortocoronary bypass graft: Secondary | ICD-10-CM

## 2015-02-04 NOTE — Progress Notes (Signed)
[address]       [place] [address]             [phone]          HPI: Patient returns for routine postoperative follow-up having undergone CABG x 3 by Dr. Donata Clay on 01/14/2015.  The patient's postoperative course was notable for atrial fibrillation. He was started on Amiodarone and converted to sinus rhythm, but developed prolongation of his QTc, and Amiodarone was discontinued.  He also had increased oxygen requirements, but had been weaned to room air by the date of discharge. He was able to go home on 01/23/2015.  Since hospital discharge, the patient reports doing well.  He is walking several times daily and denies shortness of breath or dizziness. He does still have some chest soreness, but is not taking any pain medications.  Appetite is slowly improving.  He was discharged home on insulin, and he brings in a list of his sugars today which show that they have been well controlled. Overall, he feels he is progressing well.   Current Outpatient Prescriptions  Medication Sig Dispense Refill  . acetaminophen (TYLENOL) 650 MG CR tablet Take 1,300 mg by mouth daily as needed for pain.    Marland Kitchen aspirin EC 325 MG EC tablet Take 1 tablet (325 mg total) by mouth daily. 30 tablet 0  . atorvastatin (LIPITOR) 80 MG tablet Take 1 tablet (80 mg total) by mouth daily at 6 PM. 30 tablet 1  . insulin NPH-regular Human (NOVOLIN 70/30 RELION) (70-30) 100 UNIT/ML injection Inject 17 Units into the skin 2 (two) times daily with a meal. 10 mL 3  . Insulin Syringe-Needle U-100 (INSULIN SYRINGE .3CC/31GX5/16") 31G X 5/16" 0.3 ML MISC Please dispense appropriate syringes for administration of Novolin 70/30 100 each 3  . metFORMIN (GLUCOPHAGE) 1000 MG tablet Take 1 tablet (1,000 mg total) by mouth 2 (two) times daily with a meal. 60 tablet 1  . metoprolol succinate (TOPROL-XL) 25 MG 24 hr tablet Take 1 tablet (25 mg total) by mouth daily. 30 tablet 1  . oxyCODONE (OXY IR/ROXICODONE) 5  MG immediate release tablet Take 1-2 tablets (5-10 mg total) by mouth every 4 (four) hours as needed for severe pain. 30 tablet 0   No current facility-administered medications for this visit.     Physical Exam: BP 145/88 HR 80 Resp 20 Wounds: Sternal and right leg wounds well healed.  Sternum is stable. Heart: regular rate and rhythm Lungs: Clear to auscultation Extremities: No significant edema.  There is a small area of swelling at his lower leg stab incision which is soft, nontender and appears to be a fluid collection.  No erythema.   Diagnostic Tests: Chest xray: No results found.     Assessment/Plan: The patient is doing well status post CABG.  His blood pressure is elevated today.  We were not able to start him on an ACE-I or ARB in the hospital as he was hypotensive.  He does have an appointment in the next 2 days with cardiology, so I will defer to them to decide if this needs to be started at this time. He is maintaining sinus rhythm.  His blood sugars appear to be controlled on insulin.  I have discussed the importance of good glucose control with him, and he will make an appointment with his primary MD for follow up.  He may begin driving at this point and increase his activity as tolerated.  I have encouraged him to proceed with outpatient cardiac rehab as able.  He will have a chest x-ray on his way out today and we will follow up on this and contact him with any abnormal results. We will see him back in 3-4 weeks or sooner if he develops problems in the interim.

## 2015-02-05 ENCOUNTER — Ambulatory Visit: Payer: Self-pay | Admitting: Cardiothoracic Surgery

## 2015-02-07 ENCOUNTER — Encounter: Payer: Self-pay | Admitting: Physician Assistant

## 2015-02-07 ENCOUNTER — Ambulatory Visit (INDEPENDENT_AMBULATORY_CARE_PROVIDER_SITE_OTHER): Payer: Self-pay | Admitting: Physician Assistant

## 2015-02-07 VITALS — BP 120/74 | HR 86 | Ht 69.0 in | Wt 303.8 lb

## 2015-02-07 DIAGNOSIS — I10 Essential (primary) hypertension: Secondary | ICD-10-CM

## 2015-02-07 DIAGNOSIS — G4733 Obstructive sleep apnea (adult) (pediatric): Secondary | ICD-10-CM

## 2015-02-07 DIAGNOSIS — I2510 Atherosclerotic heart disease of native coronary artery without angina pectoris: Secondary | ICD-10-CM

## 2015-02-07 DIAGNOSIS — E119 Type 2 diabetes mellitus without complications: Secondary | ICD-10-CM

## 2015-02-07 DIAGNOSIS — E785 Hyperlipidemia, unspecified: Secondary | ICD-10-CM

## 2015-02-07 DIAGNOSIS — R0789 Other chest pain: Secondary | ICD-10-CM

## 2015-02-07 DIAGNOSIS — Z951 Presence of aortocoronary bypass graft: Secondary | ICD-10-CM

## 2015-02-07 DIAGNOSIS — E1169 Type 2 diabetes mellitus with other specified complication: Secondary | ICD-10-CM | POA: Insufficient documentation

## 2015-02-07 DIAGNOSIS — IMO0001 Reserved for inherently not codable concepts without codable children: Secondary | ICD-10-CM

## 2015-02-07 DIAGNOSIS — Z794 Long term (current) use of insulin: Secondary | ICD-10-CM

## 2015-02-07 MED ORDER — LISINOPRIL 2.5 MG PO TABS: 2.5000 mg | ORAL_TABLET | Freq: Every day | ORAL | Status: DC

## 2015-02-07 NOTE — Patient Instructions (Addendum)
Medication Instructions:  Your physician has recommended you make the following change in your medication:  START taking lisinopril 2.5mg  once per day   Labwork: Your physician recommends that you return for a FASTING lipid and BMET on August 11. Nothing to eat or drink after midnight the evening before your labs.    Testing/Procedures: none  Follow-Up: Your physician recommends that you schedule a follow-up appointment in: two months with Eula Listen, PA-C or Dr. Mariah Milling   Any Other Special Instructions Will Be Listed Below (If Applicable).

## 2015-02-07 NOTE — Progress Notes (Signed)
Cardiology Office Note:  Date of Encounter: 02/07/2015  ID: Craig Harvey, DOB [birth year], MRN [id number]  PCP:  No primary care provider on file. Primary Cardiologist:  Dr. Ida Rogue, MD  Chief Complaint  Patient presents with  . other    Follow up from [facility] s/p CABG x 3. Meds reviewed by the patient verbally. "doing well." Pt. c/o some chest soreness/tightness.     HPI:  59 year old male with known history of HTN, obesity, and OSA not on CPAP, and recently diagnosed CAD s/p 3 vessel CABG on 01/14/2015, DM, and HLD who presents for post bypass hospital follow up.   He initially presented to [facility] on 01/06/2015 with complaints of nausea, feeling hot, and chest pain with radiation into his left shoulder.He states about 1 month ago his lifted heavy sacks of newspaper about 100-150 lbs each. The patient is a hoarder and has minimal room to move around his room. His home also has little air conditioning. The patient got hot while at home with associated nausea and felt like he was going to pass out. This prompted the patient to present for evaluation. EKG was unremarkable in ED and Cardiac enzymes were negative. Echo on 7/4 showed EF 60-65%, no regional wall motion abnormalities, no valvular abnormalities, PASP normal, normal study. He underwent nuclear stress test that showed large defect of moderate severity in the mid anterior, mid anteroseptal, mid anterolateral, apical anterior, apical lateral and apex locations. Findings were consistent with ischemia and prior MI with peri-infarct ischemia. EF was approximately 53%. Overall this was a high risk study. Therefore, he underwent cardiac catheterization which showed severe 2 vessel CAD (ost LAD 70%, mid LAD 99%, ost LCx to prox LCx 95% after the take off of OM1, mid LCx 80% prior to takeoff to OM2, normal LV function).It was felt CABG would be indicated and the patient was transferred to [facility] for further care.   He  underwent successful 3 vessel CABG (LIMA-LAD, SVG-OM, SVG-Ramus) on 01/14/2015. Post-op he developed Afib and converted on IV amiodarone. His QTc was elevated at >520, he was maintaining NSR, thus this was discontinued. His A1C was poorly controlled coming in at 10.0%. He did have issues with desats with ambulation and was on O2 for several dayspost-op. He remained in the hospital until he could be weaned off as he could not afford O2 as an outpatient. At his CVTS follow up he was doing well, ambulating without issues. His blood sugars were controlled on insulin and he plans to follow up with his. He was noted to be hypertensive, though his BP was soft in the hospital which precluded the addtion of ACEi or ARB at his follow up.  He states he is doing well.  He says his recovery from CABG has gone been without issue.  He walks a lot around the grocery store and does flights of stairs 3-4 times a day.  He denies any weakness, dizziness, chest pain, or regular shortness of breath other than after climbing stairs for excercise.  His incision sites are not sore and he has had no discharge from them.  He is only having to take oxycodone sporadically.  He is staying with his niece currently and she is making him homemade meals daily.  He is watching his sugar and salt intake and is avoiding fried and fast food.  He is pleased with his 20 pound weight loss since the beginning of July and plans to continue with his  diet, exercise, and weight loss.  He does not check his BP at home, but he does check his blood sugar and he is seeing much better glucose numbers since discharge.  He sees 120's to 170's, but his average is around 139.  He sleeps well at night and continues to sleep on his baseline of 4 pillows.  While he does not use his CPAP nightly for his OSA, he does use it ~ 4 times a week, which is more than he used to use it prior to his hospital stay.  He says it makes him hot or uncomfortable sometimes so he cannot  wear it every night.  He has been taking all of his medications as prescribed.  The only new thing he has noticed during the healing process is a knot along the medial side of his right calf, below the incision site for saphenous vein graft.  He spoke with the cardiothoracic PA about this as well at his visit on 8/2.  He is very interested in finding a PCP, and ask for our assistance today in setting one up.  He is interested in taking care of his health. He does not have any questions or concerns today.          Past Medical History  Diagnosis Date  . Hypertension   . History of echocardiogram     a. echo 01/2015: EF 60-65%, no RWMA, LA nl, nl PASP - nl study  . CAD (coronary artery disease)     a. 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 cardiac cath 01/09/2015: ostLAD 70:, mLAD 99%, ost to pLCx 95%, mLCx 80%, LVEF nl, recommend CABGl; c. s/p 3v CABG 01/14/15 LIMA-LAD, SVG-OM, SVG-Ramus  . Morbid obesity   . IDDM (insulin dependent diabetes mellitus)     a. newly diagnosed 01/2015; b. A1C 10.1% on 01/06/2015  . HLD (hyperlipidemia)   . OSA (obstructive sleep apnea)     a. does not use cpap  :  Past Surgical History  Procedure Laterality Date  . Hernia repair    . Cardiac catheterization N/A 01/09/2015    Procedure: Left Heart Cath and Coronary Angiography;  Surgeon: Minna Merritts, MD;  Location: [place] [facility];  Service: Cardiovascular;  Laterality: N/A;  . Coronary artery bypass graft N/A 01/14/2015    Procedure: CORONARY ARTERY BYPASS GRAFTING (CABG), ON PUMP, TIMES THREE, USING LEFT INTERNAL MAMMARY ARTERY, RIGHT GREATER SAPHENOUS VEIN HARVESTED ENDOSCOPICALLY;  Surgeon: Ivin Poot, MD;  Location: [place];  Service: Open Heart Surgery;  Laterality: N/A;  . Tee without cardioversion N/A  01/14/2015    Procedure: TRANSESOPHAGEAL ECHOCARDIOGRAM (TEE);  Surgeon: Ivin Poot, MD;  Location: [place];  Service: Open Heart Surgery;  Laterality: N/A;  :  Social History:  The patient  reports that he has never smoked. He does not have any smokeless tobacco history on file. He reports that he does not drink alcohol.   Family History  Problem Relation Age of Onset  . Diabetes Neg Hx   . CAD Mother   . CAD Brother      Allergies:  No Known Allergies   Home Medications:  Current Outpatient Prescriptions  Medication Sig Dispense Refill  . acetaminophen (TYLENOL) 650 MG CR tablet Take 1,300 mg by mouth daily as needed for  pain.    . aspirin EC 325 MG EC tablet Take 1 tablet (325 mg total) by mouth daily. 30 tablet 0  . atorvastatin (LIPITOR) 80 MG tablet Take 1 tablet (80 mg total) by mouth daily at 6 PM. 30 tablet 1  . insulin NPH-regular Human (NOVOLIN 70/30 RELION) (70-30) 100 UNIT/ML injection Inject 17 Units into the skin 2 (two) times daily with a meal. 10 mL 3  . Insulin Syringe-Needle U-100 (INSULIN SYRINGE .3CC/31GX5/16") 31G X 5/16" 0.3 ML MISC Please dispense appropriate syringes for administration of Novolin 70/30 100 each 3  . metFORMIN (GLUCOPHAGE) 1000 MG tablet Take 1 tablet (1,000 mg total) by mouth 2 (two) times daily with a meal. 60 tablet 1  . metoprolol succinate (TOPROL-XL) 25 MG 24 hr tablet Take 1 tablet (25 mg total) by mouth daily. 30 tablet 1  . oxyCODONE (OXY IR/ROXICODONE) 5 MG immediate release tablet Take 1-2 tablets (5-10 mg total) by mouth every 4 (four) hours as needed for severe pain. 30 tablet 0  . lisinopril (PRINIVIL,ZESTRIL) 2.5 MG tablet Take 1 tablet (2.5 mg total) by mouth daily. 30 tablet 5   No current facility-administered medications for this visit.     Review of Systems:  Review of Systems  Constitutional: Negative for fever, chills, weight loss, malaise/fatigue and diaphoresis.  HENT: Negative for congestion, ear discharge  and ear pain.   Eyes: Negative for pain, discharge and redness.  Respiratory: Negative for cough, sputum production and wheezing.   Cardiovascular: Negative for chest pain, palpitations, orthopnea, claudication, leg swelling and PND.  Gastrointestinal: Negative for nausea, vomiting and abdominal pain.  Musculoskeletal: Negative for falls.  Skin: Negative for rash.  Neurological: Negative for dizziness, loss of consciousness and weakness.  Endo/Heme/Allergies: Does not bruise/bleed easily.  Psychiatric/Behavioral: The patient is not nervous/anxious.   All other systems reviewed and are negative.    Physical Exam:  Blood pressure 120/74, pulse 86, height 5' 9"  (1.753 m), weight 303 lb 12 oz (137.78 kg). BMI: Body mass index is 44.84 kg/(m^2). General: Obese, pleasant, NAD Psych: Normal affect. Responds to questions with normal affect.  Neuro: Alert and oriented X 3. Moves all extremities spontaneously. HEENT: Normocephalic, atraumatic. EOM intact. Sclera anicteric.  Neck: Trachea midline. Supple without bruits or JVD. Lungs:  Respirations regular and unlabored. CTA bilaterally without wheezing, crackles, or rhonchi.  Heart: RRR, normal s3, s4. No murmurs, rubs, or gallops. Sternal incision without erythema, discharge, tenderness, or dehiscence; appears to be healing well. Abdomen: Obese, soft, non-tender, non-distended, BS + x 4.  Extremities: No clubbing, cyanosis or edema. DP/PT/Radials 2+ and equal bilaterally.  Saphenous vein incision without erythema, discharge, tenderness, or dehiscence; appears to be healing well.  Small nodule on the medial calf below the site of incision, which may contain a superficial clot.   Accessory Clinical Findings:  EKG: NSR, rate 86 BPM; low voltage, TWI in leads I, II, aVL, aVF, and V2-V6  Recent Labs: 01/09/2015: B Natriuretic Peptide 13.2 01/17/2015: Magnesium 2.1; TSH 0.924 01/18/2015: ALT 38 01/22/2015: BUN 24*; Creatinine, Ser 1.14; Hemoglobin  11.3*; Platelets 216; Potassium 3.3*; Sodium 128*  01/09/2015: Cholesterol 269*; HDL 46; LDL Cholesterol 193*; Total CHOL/HDL Ratio 5.8; Triglycerides 148; VLDL 30  Estimated Creatinine Clearance: 99.8 mL/min (by C-G formula based on Cr of 1.14).  Weights: Wt Readings from Last 3 Encounters:  02/07/15 303 lb 12 oz (137.78 kg)  02/04/15 300 lb (136.079 kg)  01/23/15 304 lb 8 oz (138.12 kg)    Other studies  Reviewed: Additional studies/ records that were reviewed today include: previous labs, admit/progress/discharge/procedure notes, and imaging reports.  Assessment & Plan:  1. CAD s/p CABG x 3: -No angina or anginal symptoms  -Healing well -Continue aspirin at current dose per CVTS. Will defer to them for tapering down to 81 mg daily -No indication for Plavix  -Continue Toprol XL 25 mg and Lipitor 80 mg -Encouraged cardiac rehab, he plans to attend   2. Hyperlipidemia -Cholesterol 269, LDL 193 on 7/7 -Not on lipid medication prior to hospital visit at beginning of July -Currently on Lipitor 80 mg daily, compliant with medication -Returning for lab-only visit on 8/11 to re-check fasting lipid panel -Continue current regimen if lipids improving to goal of <70; consider switching to Crestor if results are not improved vs looking into PCKS9 inhibitor   3. HTN: -Soft while inpatient, then slightly high at CVTS follow up -On no medications before hospital admission in early July -BP today is 120/74 -Cardiothoracic surgery unable to start him on an ACE-I or ARB in the hospital as he was hypotensive -Based on his renal function (SCr 1.14, EGFR > 60 on 7/20), CAD, diabetes, and hypertension, agree with starting ACE-I or ARB -Start lsinopril 2.5 mg daily  -Returning on 8/11 for a lab-only draw for BMet to re-check renal function  4. OSA: -Previously did not use CPAP -Often woke up feeling SOB -Currently using his CPAP 4 nights a week (does not use it more than that because sometimes it  make him hot or it gets uncomfortable) -Sleeping well at night -Recommend continued compliance  5. Morbid obesity: -Complicating some of his medical issues (HTN, IDDM) -BMI today 44.8, down from his BMI of 47.3 on 7/3 -He has started lifestyle modifications to include eating home cooked meals, watching sugar and salt, avoiding fast food and fried food, and walking a lot more on a daily basis -Recommend continued weight loss and diet management  6. Swelling on medial side of right calf -He was made aware that this is common with saphenous vein harvest, it should resolve in a few months -Instructed to use warm compresses at the site, as there may be a superficial clot in the area -He is willing to do this  7. IDDM: -Improving -Referred to PCP today  Dispo: -Return in ~1 week (8/11) for lab-only visit to draw fasting lipid and Bmet -Follow up in 2 months with Dr. Rockey Situ  Current medicines are reviewed at length with the patient today.  The patient did not have any concerns regarding current or new medications.  He was instructed to continue his current medications as prescribed.   Christell Faith, PA-C [facility] [place] [address] [place], [place] [address] [phone] [place] 02/07/2015, 3:35 PM

## 2015-02-13 ENCOUNTER — Other Ambulatory Visit: Payer: No Typology Code available for payment source

## 2015-02-13 ENCOUNTER — Other Ambulatory Visit (INDEPENDENT_AMBULATORY_CARE_PROVIDER_SITE_OTHER): Payer: Self-pay | Admitting: *Deleted

## 2015-02-13 DIAGNOSIS — I2510 Atherosclerotic heart disease of native coronary artery without angina pectoris: Secondary | ICD-10-CM

## 2015-02-14 LAB — BASIC METABOLIC PANEL
BUN / CREAT RATIO: 13 (ref 9–20)
BUN: 11 mg/dL (ref 6–24)
CHLORIDE: 95 mmol/L — AB (ref 97–108)
CO2: 21 mmol/L (ref 18–29)
Calcium: 9.6 mg/dL (ref 8.7–10.2)
Creatinine, Ser: 0.86 mg/dL (ref 0.76–1.27)
GFR calc Af Amer: 112 mL/min/{1.73_m2} (ref >59)
GFR, EST NON AFRICAN AMERICAN: 97 mL/min/{1.73_m2} (ref >59)
GLUCOSE: 104 mg/dL — AB (ref 65–99)
POTASSIUM: 4.4 mmol/L (ref 3.5–5.2)
SODIUM: 135 mmol/L (ref 134–144)

## 2015-02-14 LAB — LIPID PANEL
CHOL/HDL RATIO: 2.7 ratio (ref 0.0–5.0)
CHOLESTEROL TOTAL: 148 mg/dL (ref 100–199)
HDL: 54 mg/dL (ref >39)
LDL CALC: 78 mg/dL (ref 0–99)
Triglycerides: 79 mg/dL (ref 0–149)
VLDL Cholesterol Cal: 16 mg/dL (ref 5–40)

## 2015-02-17 ENCOUNTER — Telehealth: Payer: Self-pay

## 2015-02-17 ENCOUNTER — Ambulatory Visit: Payer: Self-pay

## 2015-02-17 NOTE — Telephone Encounter (Signed)
Request from Disabiltiy Determination Services , sent to [employer] on 02/18/2015 .

## 2015-02-19 ENCOUNTER — Telehealth: Payer: Self-pay | Admitting: Physician Assistant

## 2015-02-19 NOTE — Telephone Encounter (Signed)
Patient was able to fill $4 meds at [facility] (metformin and Lisinopril) but patient cannot afford to get the rest of his medicines .  Please call about financial assistance programs  Patient may also need help [facility] as they have left several msg.'s

## 2015-02-20 NOTE — Telephone Encounter (Signed)
Left Medication management forms at front desk for pick up. Pt may need to go to [facility]. We do not do referrals for them.

## 2015-02-20 NOTE — Telephone Encounter (Signed)
LMOVM to contact office concerning medication assistance.

## 2015-03-05 ENCOUNTER — Ambulatory Visit (INDEPENDENT_AMBULATORY_CARE_PROVIDER_SITE_OTHER): Payer: Self-pay | Admitting: Cardiothoracic Surgery

## 2015-03-05 ENCOUNTER — Encounter: Payer: Self-pay | Admitting: Cardiothoracic Surgery

## 2015-03-05 VITALS — BP 152/96 | HR 92 | Resp 20 | Ht 69.0 in | Wt 300.0 lb

## 2015-03-05 DIAGNOSIS — Z951 Presence of aortocoronary bypass graft: Secondary | ICD-10-CM

## 2015-03-05 NOTE — Progress Notes (Signed)
PCP is No primary care provider on file. Referring Provider is Mariah Milling, Tollie Pizza, MD  Chief Complaint  Patient presents with  . Routine Post Op    4 week f/u    [id number] doing well 6 weeks after urgent CABG. The incisions are healed well he has had no recurrent symptoms of either angina or heart f failure. The patient is fairly indigent and has tried to establish her primary care physician and receiving prescription meds [facility]. He is not smoking.   Past Medical History  Diagnosis Date  . Hypertension   . History of echocardiogram     a. echo 01/2015: EF 60-65%, no RWMA, LA nl, nl PASP - nl study  . CAD (coronary artery disease)     a. 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 cardiac cath 01/09/2015: ostLAD 70:, mLAD 99%, ost to pLCx 95%, mLCx 80%, LVEF nl, recommend CABGl; c. s/p 3v CABG 01/14/15 LIMA-LAD, SVG-OM, SVG-Ramus  . Morbid obesity   . IDDM (insulin dependent diabetes mellitus)     a. newly diagnosed 01/2015; b. A1C 10.1% on 01/06/2015  . HLD (hyperlipidemia)   . OSA (obstructive sleep apnea)     a. does not use cpap    Past Surgical History  Procedure Laterality Date  . Hernia repair    . Cardiac catheterization N/A 01/09/2015    Procedure: Left Heart Cath and Coronary Angiography;  Surgeon: Antonieta Iba, MD;  Location: [facility] INVASIVE CV LAB;  Service: Cardiovascular;  Laterality: N/A;  . Coronary artery bypass graft N/A 01/14/2015    Procedure: CORONARY ARTERY BYPASS GRAFTING (CABG), ON PUMP, TIMES THREE, USING LEFT INTERNAL MAMMARY ARTERY, RIGHT GREATER SAPHENOUS VEIN HARVESTED ENDOSCOPICALLY;  Surgeon: Kerin Perna, MD;  Location: [facility] OR;  Service: Open Heart Surgery;  Laterality: N/A;  . Tee without cardioversion N/A 01/14/2015     Procedure: TRANSESOPHAGEAL ECHOCARDIOGRAM (TEE);  Surgeon: Kerin Perna, MD;  Location: [facility] OR;  Service: Open Heart Surgery;  Laterality: N/A;    Family History  Problem Relation Age of Onset  . Diabetes Neg Hx   . CAD Mother   . CAD Brother     Social History Social History  Substance Use Topics  . Smoking status: Never Smoker   . Smokeless tobacco: None  . Alcohol Use: No    Current Outpatient Prescriptions  Medication Sig Dispense Refill  . acetaminophen (TYLENOL) 650 MG CR tablet Take 1,300 mg by mouth daily as needed for pain.    Marland Kitchen aspirin EC 325 MG EC tablet Take 1 tablet (325 mg total) by mouth daily. 30 tablet 0  . atorvastatin (LIPITOR) 80 MG tablet Take 1 tablet (80 mg total) by mouth daily at 6 PM. 30 tablet 1  . insulin NPH-regular Human (NOVOLIN 70/30 RELION) (70-30) 100 UNIT/ML injection Inject 17 Units into the skin 2 (two) times daily with a meal. 10 mL 3  . Insulin Syringe-Needle U-100 (INSULIN SYRINGE .3CC/31GX5/16") 31G X 5/16" 0.3 ML MISC Please dispense appropriate syringes for administration of Novolin 70/30 100 each 3  . lisinopril (PRINIVIL,ZESTRIL) 2.5 MG tablet Take 1 tablet (2.5 mg total) by mouth daily. 30 tablet 5  . metFORMIN (GLUCOPHAGE) 1000 MG tablet Take 1 tablet (1,000 mg total) by mouth 2 (two) times daily with a  meal. 60 tablet 1  . metoprolol succinate (TOPROL-XL) 25 MG 24 hr tablet Take 1 tablet (25 mg total) by mouth daily. 30 tablet 1  . oxyCODONE (OXY IR/ROXICODONE) 5 MG immediate release tablet Take 1-2 tablets (5-10 mg total) by mouth every 4 (four) hours as needed for severe pain. 30 tablet 0   No current facility-administered medications for this visit.    No Known Allergies  Review of Systems Improved appetite and strength Improve sleeping Small seroma in the right lower leg from vein harvest site has decreased in size BP 152/96 mmHg  Pulse 92  Resp 20  Ht  (1.753 m)  Wt 300 lb (136.079 kg)  BMI 44.28 kg/m2  SpO2  97% Physical Exam Alert and comfortable Lungs clear Heart rate regular without murmur or gallop Legs without edema Surgical incisions healed  Diagnostic Tests: No chest x-ray today  Impression: Doing well 6 weeks status post CABG He understands he should not lift more than 20 pounds until mid October. He understands the importance of nonsmoking, good diet and compliance with medications.  Plan:patient is recovered well from surgery. He will  return here as needed for a future surgical issues.   Mikey Bussing, MD [facility] [phone]

## 2015-03-06 ENCOUNTER — Telehealth: Payer: Self-pay | Admitting: *Deleted

## 2015-03-06 NOTE — Telephone Encounter (Signed)
I received a call early this morning from Craig Harvey's niece.  He is s/p CABG by Dr. Donata Clay and was seen in the office on 03/05/15. From a surgical standpoint he is progressing well and was released. He saw Dr. Donata Clay alone in the exam room and did not relay any problems to him per his niece when she questioned him after the appointment.  There are concerns regarding medication management due to lack of funds.  On discharge, a voucher was given for a limited time.  This has expired.  They did go [facility] to [facility] [place] to establish primary care and was told it would probably take  a week and a half for that to happen.  They visited SS and were given a few meds.  He used $40 of his $100 allotted for every 6 months.  He still does not have his insulin, needles, syringes, strips or the meter nor his Lisinopril. Johnny Bridge says he nor she has the money to buy these.  This is a documentation of his unfortunate status.  She wanted it known he would not have these meds to take.  Of note he has applied for medicaid but still has at least 60 days for this to be in effect.

## 2015-03-06 NOTE — Telephone Encounter (Signed)
Nurse calling from Dr Kathlee Nations Trights' office calling asking if pt can get samples of Lisinopril.  Please call her if we do or don't.

## 2015-03-06 NOTE — Telephone Encounter (Signed)
LMOM we do not sample Lisinopril.

## 2015-03-07 ENCOUNTER — Other Ambulatory Visit: Payer: Self-pay | Admitting: *Deleted

## 2015-03-07 ENCOUNTER — Telehealth: Payer: Self-pay

## 2015-03-07 NOTE — Telephone Encounter (Signed)
Received records request from [employer] , forwarded to [employer] for processing. ° °

## 2015-03-19 ENCOUNTER — Ambulatory Visit: Payer: Self-pay | Admitting: Internal Medicine

## 2015-03-28 ENCOUNTER — Telehealth: Payer: Self-pay

## 2015-03-28 NOTE — Telephone Encounter (Signed)
Received records request [employer], forwarded to [employer] for processing.  

## 2015-04-10 ENCOUNTER — Ambulatory Visit (INDEPENDENT_AMBULATORY_CARE_PROVIDER_SITE_OTHER): Payer: No Typology Code available for payment source | Admitting: Cardiovascular Disease

## 2015-04-10 ENCOUNTER — Ambulatory Visit: Payer: Self-pay

## 2015-04-10 ENCOUNTER — Encounter: Payer: Self-pay | Admitting: Cardiovascular Disease

## 2015-04-10 VITALS — BP 140/90 | HR 74 | Ht 69.0 in | Wt 307.8 lb

## 2015-04-10 DIAGNOSIS — Z794 Long term (current) use of insulin: Secondary | ICD-10-CM

## 2015-04-10 DIAGNOSIS — I10 Essential (primary) hypertension: Secondary | ICD-10-CM

## 2015-04-10 DIAGNOSIS — IMO0001 Reserved for inherently not codable concepts without codable children: Secondary | ICD-10-CM

## 2015-04-10 DIAGNOSIS — Z951 Presence of aortocoronary bypass graft: Secondary | ICD-10-CM

## 2015-04-10 DIAGNOSIS — I200 Unstable angina: Secondary | ICD-10-CM

## 2015-04-10 DIAGNOSIS — E119 Type 2 diabetes mellitus without complications: Secondary | ICD-10-CM

## 2015-04-10 DIAGNOSIS — R079 Chest pain, unspecified: Secondary | ICD-10-CM

## 2015-04-10 MED ORDER — LISINOPRIL 10 MG PO TABS: 10.0000 mg | ORAL_TABLET | Freq: Every day | ORAL | Status: DC

## 2015-04-10 NOTE — Assessment & Plan Note (Signed)
He denies any symptoms concerning for unstable angina No further workup at this time

## 2015-04-10 NOTE — Progress Notes (Signed)
Patient ID: Craig Harvey, male    DOB: 1955/09/02, 59 y.o.   MRN: [id number]  HPI Comments: 59 year old male with known history of HTN, obesity, and OSA not on CPAP, and recently diagnosed CAD s/p 3 vessel CABG on 01/14/2015, DM, and HLD who presents for post bypass hospital follow up.   In follow-up, he reports that he is doing well. Active, denies any significant chest pain with exertion. Denies any lower extremity edema, no PND or orthopnea. [facility] Does not check his blood pressure at home. Does check his blood pressure periodically and a pharmacy, running high at times Reports compliance with his insulin Has close follow-up with primary care in the next several weeks  EKG on today's visit shows normal sinus rhythm with rate 74 bpm, nonspecific T wave abnormality anterolateral leads  Other past medical history [facility]2015 with complaints of nausea, feeling hot, and chest pain with radiation into his left shoulder. EKG was unremarkable in ED and Cardiac enzymes were negative.  Echo on 7/4 showed EF 60-65%, no regional wall motion abnormalities, no valvular abnormalities, PASP normal, normal study.  He underwent nuclear stress test that showed large defect of moderate severity in the mid anterior, mid anteroseptal, mid anterolateral, apical anterior, apical lateral and apex locations. F   underwent cardiac catheterization which showed severe 2 vessel CAD (ost LAD 70%, mid LAD 99%, ost LCx to prox LCx 95% after the take off of OM1, mid LCx 80% prior to takeoff to OM2, normal LV function).It was felt CABG would be indicated and the patient was transferred to [facility] for further care.   He underwent successful 3 vessel CABG (LIMA-LAD, SVG-OM, SVG-Ramus) on 01/14/2015. Post-op he developed Afib and converted on IV amiodarone. His A1C was poorly controlled coming in at 10.0%.   No Known Allergies  Current Outpatient  Prescriptions on File Prior to Visit  Medication Sig Dispense Refill  . acetaminophen (TYLENOL) 650 MG CR tablet Take 1,300 mg by mouth daily as needed for pain.    Marland Kitchen aspirin EC 325 MG EC tablet Take 1 tablet (325 mg total) by mouth daily. 30 tablet 0  . atorvastatin (LIPITOR) 80 MG tablet Take 1 tablet (80 mg total) by mouth daily at 6 PM. 30 tablet 1  . insulin NPH-regular Human (NOVOLIN 70/30 RELION) (70-30) 100 UNIT/ML injection Inject 17 Units into the skin 2 (two) times daily with a meal. 10 mL 3  . Insulin Syringe-Needle U-100 (INSULIN SYRINGE .3CC/31GX5/16") 31G X 5/16" 0.3 ML MISC Please dispense appropriate syringes for administration of Novolin 70/30 100 each 3  . metFORMIN (GLUCOPHAGE) 1000 MG tablet Take 1 tablet (1,000 mg total) by mouth 2 (two) times daily with a meal. 60 tablet 1  . metoprolol succinate (TOPROL-XL) 25 MG 24 hr tablet Take 1 tablet (25 mg total) by mouth daily. 30 tablet 1  . oxyCODONE (OXY IR/ROXICODONE) 5 MG immediate release tablet Take 1-2 tablets (5-10 mg total) by mouth every 4 (four) hours as needed for severe pain. 30 tablet 0   No current facility-administered medications on file prior to visit.    Past Medical History  Diagnosis Date  . Hypertension   . History of echocardiogram     a. echo 01/2015: EF 60-65%, no RWMA, LA nl, nl PASP - nl study  . CAD (coronary artery disease)     a. treadmill myoview 01/2015: high risk study, lg defect of mod  severity present along mid ant, mid anteroseptal, mid anterolateral, apical ant, apical inf, apical lat & apex, c/w ischemia & prior MI w/ peri-infarct ischemia, HTN response; b. cardiac cath 01/09/2015: ostLAD 70:, mLAD 99%, ost to pLCx 95%, mLCx 80%, LVEF nl, recommend CABGl; c. s/p 3v CABG 01/14/15 LIMA-LAD, SVG-OM, SVG-Ramus  . Morbid obesity (HCC)   . IDDM (insulin dependent diabetes mellitus) (HCC)     a. newly diagnosed 01/2015; b. A1C 10.1% on 01/06/2015  . HLD (hyperlipidemia)   . OSA (obstructive sleep apnea)      a. does not use cpap    Past Surgical History  Procedure Laterality Date  . Hernia repair    . Cardiac catheterization N/A 01/09/2015    Procedure: Left Heart Cath and Coronary Angiography;  Surgeon: Antonieta Iba, MD;  Location: [facility] INVASIVE CV LAB;  Service: Cardiovascular;  Laterality: N/A;  . Coronary artery bypass graft N/A 01/14/2015    Procedure: CORONARY ARTERY BYPASS GRAFTING (CABG), ON PUMP, TIMES THREE, USING LEFT INTERNAL MAMMARY ARTERY, RIGHT GREATER SAPHENOUS VEIN HARVESTED ENDOSCOPICALLY;  Surgeon: Kerin Perna, MD;  Location: [facility] OR;  Service: Open Heart Surgery;  Laterality: N/A;  . Tee without cardioversion N/A 01/14/2015    Procedure: TRANSESOPHAGEAL ECHOCARDIOGRAM (TEE);  Surgeon: Kerin Perna, MD;  Location: [facility] OR;  Service: Open Heart Surgery;  Laterality: N/A;    Social History  reports that he has never smoked. He does not have any smokeless tobacco history on file. He reports that he does not drink alcohol.  Family History family history includes CAD in his brother and mother. There is no history of Diabetes.   Review of Systems  Constitutional: Negative.   Respiratory: Negative.   Cardiovascular: Negative.   Gastrointestinal: Negative.   Musculoskeletal: Negative.   Neurological: Negative.   Hematological: Negative.   Psychiatric/Behavioral: Negative.   All other systems reviewed and are negative.   BP 140/90 mmHg  Pulse 74  Ht  (1.753 m)  Wt 307 lb 12 oz (139.594 kg)  BMI 45.43 kg/m2   Physical Exam  Constitutional: He is oriented to person, place, and time. He appears well-developed and well-nourished.  Morbid obesity  HENT:  Head: Normocephalic.  Nose: Nose normal.  Mouth/Throat: Oropharynx is clear and moist.  Eyes: Conjunctivae are normal. Pupils are equal, round, and reactive to light.  Neck: Normal range of motion. Neck supple. No JVD present.  Cardiovascular: Normal rate, regular rhythm, normal heart sounds and intact  distal pulses.  Exam reveals no gallop and no friction rub.   No murmur heard. Well healed mediastinal incision  Pulmonary/Chest: Effort normal and breath sounds normal. No respiratory distress. He has no wheezes. He has no rales. He exhibits no tenderness.  Abdominal: Soft. Bowel sounds are normal. He exhibits no distension. There is no tenderness.  Musculoskeletal: Normal range of motion. He exhibits no edema or tenderness.  Lymphadenopathy:    He has no cervical adenopathy.  Neurological: He is alert and oriented to person, place, and time. Coordination normal.  Skin: Skin is warm and dry. No rash noted. No erythema.  Psychiatric: He has a normal mood and affect. His behavior is normal. Judgment and thought content normal.

## 2015-04-10 NOTE — Assessment & Plan Note (Signed)
He is close to 3 months out from his CABG Doing well, no complaints Stressed the importance of close follow-up with primary care for his diabetes control

## 2015-04-10 NOTE — Assessment & Plan Note (Signed)
History of poor control diabetes. Stressed the importance of diet, this was discussed with him today He has close follow-up with primary care

## 2015-04-10 NOTE — Assessment & Plan Note (Signed)
We have recommended he increase lisinopril up to 10 mg daily Script provided

## 2015-04-10 NOTE — Patient Instructions (Signed)
You are doing well.  Please increase the lisinopril up to 10 mg daily  Please call us if you have new issues that need to be addressed before your next appt.  Your physician wants you to follow-up in: 6 months.  You will receive a reminder letter in the mail two months in advance. If you don't receive a letter, please call our office to schedule the follow-up appointment.

## 2015-04-11 ENCOUNTER — Ambulatory Visit: Payer: Self-pay

## 2015-04-16 ENCOUNTER — Ambulatory Visit: Payer: Self-pay | Admitting: Internal Medicine

## 2015-04-23 ENCOUNTER — Ambulatory Visit: Payer: Self-pay

## 2015-07-15 ENCOUNTER — Telehealth: Payer: Self-pay

## 2015-07-15 NOTE — Telephone Encounter (Signed)
Received records request [employer], forwarded to [employer] for processing.  

## 2015-07-23 ENCOUNTER — Other Ambulatory Visit: Payer: Self-pay

## 2015-07-28 ENCOUNTER — Telehealth: Payer: Self-pay

## 2015-07-28 NOTE — Telephone Encounter (Signed)
Received records request [employer], forwarded to [employer] for processing.  

## 2015-07-30 ENCOUNTER — Ambulatory Visit: Payer: Self-pay | Admitting: Internal Medicine

## 2015-08-06 DIAGNOSIS — I25709 Atherosclerosis of coronary artery bypass graft(s), unspecified, with unspecified angina pectoris: Secondary | ICD-10-CM

## 2015-08-06 DIAGNOSIS — E785 Hyperlipidemia, unspecified: Secondary | ICD-10-CM

## 2015-08-06 DIAGNOSIS — I10 Essential (primary) hypertension: Secondary | ICD-10-CM

## 2015-08-06 DIAGNOSIS — E119 Type 2 diabetes mellitus without complications: Secondary | ICD-10-CM

## 2015-08-06 DIAGNOSIS — Z736 Limitation of activities due to disability: Secondary | ICD-10-CM

## 2015-08-26 ENCOUNTER — Other Ambulatory Visit: Payer: Self-pay

## 2015-09-03 ENCOUNTER — Telehealth: Payer: Self-pay

## 2015-09-03 ENCOUNTER — Ambulatory Visit: Payer: Self-pay | Admitting: Internal Medicine

## 2015-09-03 NOTE — Telephone Encounter (Signed)
We need to reschedule patients appointment for 09/17/15 because Dr. Candelaria Stagers will not be here.

## 2015-09-04 NOTE — Telephone Encounter (Signed)
Called Craig Harvey to cancel 3/15 apt with Dr. Lunette Stands and rescheduled for 3/29. Craig Harvey thought he was scheduled for bp check on Tuesday 3/7 but it was not in the system so I scheduled one for 3/9.

## 2015-09-11 ENCOUNTER — Telehealth: Payer: Self-pay

## 2015-09-11 ENCOUNTER — Other Ambulatory Visit: Payer: Self-pay

## 2015-09-11 ENCOUNTER — Ambulatory Visit: Payer: Self-pay

## 2015-09-11 VITALS — BP 128/75 | HR 97 | Temp 98.6°F | Resp 14

## 2015-09-11 DIAGNOSIS — I10 Essential (primary) hypertension: Secondary | ICD-10-CM

## 2015-09-11 MED ORDER — METOPROLOL SUCCINATE ER 25 MG PO TB24: 25.0000 mg | ORAL_TABLET | Freq: Every day | ORAL | Status: DC

## 2015-09-11 NOTE — Telephone Encounter (Signed)
Pt out of metoprolol ( Toprol)  Needs refill ASAP

## 2015-09-11 NOTE — Telephone Encounter (Signed)
patient called needs refill on Metoprolol.  [phone]

## 2015-09-12 ENCOUNTER — Telehealth: Payer: Self-pay

## 2015-09-12 NOTE — Telephone Encounter (Signed)
1 week supply will be filled today at [facility], mr can pick up later today

## 2015-09-12 NOTE — Telephone Encounter (Signed)
[facility]   Hello,  We actually filled this Rx on the 8th, he did not come to get it yet. Dr. Lunette Standshaplain was the provider.  Thanks,  Peter Kiewit SonsKeri  Keri K. Joelene MillinHarrison, BS, PharmD  I notified patient , he will pick up Rx today. I canceled medication reorder.

## 2015-09-12 NOTE — Telephone Encounter (Signed)
Called patient let him know 1 week supply of medicine will be avail to be [facility].  Complete prescription still needs to be filled

## 2015-09-12 NOTE — Telephone Encounter (Signed)
Received e mail from Lianne CureKerri Harrison at [facility]. Canceled reorder   Hello,  We actually filled this Rx on the 8th, he did not come to get it yet. Dr. Lunette Standshaplain was the provider.  Thanks,  Peter Kiewit SonsKeri  Keri K. Joelene MillinHarrison, BS, PharmD

## 2015-09-17 ENCOUNTER — Ambulatory Visit: Payer: Self-pay | Admitting: Internal Medicine

## 2015-10-01 ENCOUNTER — Encounter: Payer: Self-pay | Admitting: Internal Medicine

## 2015-10-01 ENCOUNTER — Ambulatory Visit: Payer: Self-pay | Admitting: Internal Medicine

## 2015-10-01 VITALS — BP 154/96 | HR 81 | Temp 98.2°F | Wt 276.0 lb

## 2015-10-01 DIAGNOSIS — Z794 Long term (current) use of insulin: Principal | ICD-10-CM

## 2015-10-01 DIAGNOSIS — I10 Essential (primary) hypertension: Secondary | ICD-10-CM

## 2015-10-01 DIAGNOSIS — E119 Type 2 diabetes mellitus without complications: Secondary | ICD-10-CM

## 2015-10-01 LAB — GLUCOSE, POCT (MANUAL RESULT ENTRY): POC Glucose: 127 mg/dl — AB (ref 70–99)

## 2015-10-01 MED ORDER — LISINOPRIL 10 MG PO TABS: 20.0000 mg | ORAL_TABLET | Freq: Every day | ORAL | Status: DC

## 2015-10-01 MED ORDER — METOPROLOL SUCCINATE ER 25 MG PO TB24
50.0000 mg | ORAL_TABLET | Freq: Every day | ORAL | Status: DC
Start: 2015-10-01 — End: 2016-01-28

## 2015-10-01 NOTE — Assessment & Plan Note (Signed)
BP taken twice this morning. First reading: 154/96. Second reading by Dr.Chaplin: 158/98. Need to increase medications. Increased Lisinopril from 10 mg to 20 mg.

## 2015-10-01 NOTE — Assessment & Plan Note (Signed)
Controlled.  

## 2015-10-01 NOTE — Progress Notes (Signed)
Subjective:    Patient ID: Craig Harvey, male    DOB: [birth year], 60 y.o.   MRN: [id number]  HPI  Patient Active Problem List   Diagnosis Date Noted  . CAD (coronary artery disease) of artery bypass graft 07/30/2015  . Diabetes ([place]) 07/30/2015  . Hyperlipemia 07/30/2015  . HTN (hypertension) 07/30/2015  . Hypertension   . HLD (hyperlipidemia)   . IDDM (insulin dependent diabetes mellitus) ([place])   . 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 CAD, multiple vessel 01/10/2015  . Unstable angina ([place]) 01/09/2015  . Crescendo angina ([place])   . Chest pain on exertion   . Type 2 diabetes mellitus without complication ([place])   . OSA (obstructive sleep apnea)   . Morbid obesity ([place])   . Abnormal EKG   . Chest pain, central 01/05/2015   Pt presents with a f/u for diabetes. Blood glucose this morning was 127.  Pt presents with a f/u for hypertension. BP this morning was 154/96.   Pt claims his heart is doing well.   Pt is back today because at the last visit they added hydrocholorthiazine and wanted to see how he is doing on it.    Review of Systems     Objective:   Physical Exam  Constitutional: He is oriented to person, place, and time.  Cardiovascular: Normal rate, regular rhythm and normal heart sounds.   Pulmonary/Chest: Effort normal and breath sounds normal.  Neurological: He is alert and oriented to person, place, and time.    BP 154/96 mmHg  Pulse 81  Temp(Src) 98.2 F (36.8 C)  Wt 276 lb (125.193 kg)    Medication List       This list is accurate as of: 10/01/15 12:14 PM.  Always use your most recent med list.               acetaminophen 650 MG CR tablet  Commonly known as:  TYLENOL  Take 1,300 mg by mouth daily as needed for pain.     aspirin 325 MG EC tablet  Take 1 tablet (325 mg total) by mouth daily.     atorvastatin 80 MG tablet  Commonly known as:  LIPITOR  Take 1 tablet (80 mg total) by mouth daily at 6 PM.     hydrochlorothiazide 25 MG tablet   Commonly known as:  HYDRODIURIL  Take 25 mg by mouth daily.     insulin NPH-regular Human (70-30) 100 UNIT/ML injection  Commonly known as:  NOVOLIN 70/30 RELION  Inject 17 Units into the skin 2 (two) times daily with a meal.     INSULIN SYRINGE .3CC/31GX5/16" 31G X 5/16" 0.3 ML Misc  Please dispense appropriate syringes for administration of Novolin 70/30     lisinopril 10 MG tablet  Commonly known as:  PRINIVIL,ZESTRIL  Take 2 tablets (20 mg total) by mouth daily.     metFORMIN 1000 MG tablet  Commonly known as:  GLUCOPHAGE  Take 1 tablet (1,000 mg total) by mouth 2 (two) times daily with a meal.     metoprolol succinate 25 MG 24 hr tablet  Commonly known as:  TOPROL-XL  Take 2 tablets (50 mg total) by mouth daily.        Blood glucose: 127     Assessment & Plan:  HTN (hypertension) BP taken twice this morning. First reading: 154/96. Second reading by Dr.Chaplin: 158/98. Need to increase medications. Increased Lisinopril from 10 mg to 20 mg.   Diabetes ([place]) Controlled.  Refilled Metoprolol.   Md f/u: 2 months.  Labs before: A1c, Met c, CBC, lipid Needs copy of labs Jan 18 to take to his cardiologist.

## 2015-10-10 ENCOUNTER — Encounter: Payer: Self-pay | Admitting: Cardiovascular Disease

## 2015-10-10 ENCOUNTER — Ambulatory Visit (INDEPENDENT_AMBULATORY_CARE_PROVIDER_SITE_OTHER): Payer: Medicaid Other | Admitting: Cardiovascular Disease

## 2015-10-10 VITALS — BP 140/90 | HR 82 | Ht 69.0 in | Wt 325.8 lb

## 2015-10-10 DIAGNOSIS — Z794 Long term (current) use of insulin: Secondary | ICD-10-CM

## 2015-10-10 DIAGNOSIS — I25111 Atherosclerotic heart disease of native coronary artery with angina pectoris with documented spasm: Secondary | ICD-10-CM

## 2015-10-10 DIAGNOSIS — E785 Hyperlipidemia, unspecified: Secondary | ICD-10-CM

## 2015-10-10 DIAGNOSIS — I200 Unstable angina: Secondary | ICD-10-CM | POA: Diagnosis not present

## 2015-10-10 DIAGNOSIS — I10 Essential (primary) hypertension: Secondary | ICD-10-CM

## 2015-10-10 DIAGNOSIS — Z951 Presence of aortocoronary bypass graft: Secondary | ICD-10-CM

## 2015-10-10 DIAGNOSIS — I25709 Atherosclerosis of coronary artery bypass graft(s), unspecified, with unspecified angina pectoris: Secondary | ICD-10-CM

## 2015-10-10 DIAGNOSIS — E119 Type 2 diabetes mellitus without complications: Secondary | ICD-10-CM

## 2015-10-10 MED ORDER — ISOSORBIDE MONONITRATE ER 30 MG PO TB24: 30.0000 mg | ORAL_TABLET | Freq: Every day | ORAL | Status: DC

## 2015-10-10 NOTE — Assessment & Plan Note (Signed)
He has recovered well from his bypass surgery, Nonsmoker, cholesterol at goal, discussed with him in detail Diabetes numbers not available

## 2015-10-10 NOTE — Assessment & Plan Note (Signed)
Recommended that he stay on his Lipitor, currently numbers at goal   Total encounter time more than 25 minutes  Greater than 50% was spent in counseling and coordination of care with the patient

## 2015-10-10 NOTE — Assessment & Plan Note (Signed)
Systolic and diastolic pressures are elevated. This was discussed with him in detail Recommended he start isosorbide as above

## 2015-10-10 NOTE — Progress Notes (Signed)
Patient ID: Craig Harvey, male    DOB: March 07, 1956, 60 y.o.   MRN: [id number]  HPI Comments: 60 year old male with known history of HTN, obesity, and OSA not on CPAP, and recently diagnosed CAD s/p 3 vessel CABG on 01/14/2015, DM, and HLD who presents for routine follow-up of his coronary artery disease Reports that [facility]  In follow-up, he reports doing well, receives most of [facility] medications [facility] When he walks he reports having occasional episodes of left-sided chest pain Typically these percent after he has walked at least 10 minutes or more Symptoms typically resolve without intervention Mild chronic shortness of breath on exertion Denies having significant leg edema Does not check his blood pressure at home.  Recent lab work reviewed with him showing dramatically improved total cholesterol down to 150 No recent hemoglobin A1c available  Blood pressure elevated, even on recheck 140/90  EKG on today's visit shows normal sinus rhythm with ST and T wave abnormality in inferior leads, anterolateral leads  Other past medical history presented to [facility] on 01/06/2015 with complaints of nausea, feeling hot, and chest pain with radiation into his left shoulder. EKG was unremarkable in ED and Cardiac enzymes were negative.  Echo on 7/4 showed EF 60-65%, no regional wall motion abnormalities, no valvular abnormalities, PASP normal, normal study.  He underwent nuclear stress test that showed large defect of moderate severity in the mid anterior, mid anteroseptal, mid anterolateral, apical anterior, apical lateral and apex locations. F   underwent cardiac catheterization which showed severe 2 vessel CAD (ost LAD 70%, mid LAD 99%, ost LCx to prox LCx 95% after the take off of OM1, mid LCx 80% prior to takeoff to OM2, normal LV function).It was felt CABG would be indicated and the patient was transferred to [facility] for further care.    He underwent successful 3 vessel CABG (LIMA-LAD, SVG-OM, SVG-Ramus) on 01/14/2015. Post-op he developed Afib and converted on IV amiodarone. His A1C was poorly controlled coming in at 10.0%.   No Known Allergies  Current Outpatient Prescriptions on File Prior to Visit  Medication Sig Dispense Refill  . acetaminophen (TYLENOL) 650 MG CR tablet Take 1,300 mg by mouth daily as needed for pain.    Marland Kitchen aspirin EC 325 MG EC tablet Take 1 tablet (325 mg total) by mouth daily. 30 tablet 0  . atorvastatin (LIPITOR) 80 MG tablet Take 1 tablet (80 mg total) by mouth daily at 6 PM. 30 tablet 1  . hydrochlorothiazide (HYDRODIURIL) 25 MG tablet Take 25 mg by mouth daily.    . insulin NPH-regular Human (NOVOLIN 70/30 RELION) (70-30) 100 UNIT/ML injection Inject 17 Units into the skin 2 (two) times daily with a meal. 10 mL 3  . lisinopril (PRINIVIL,ZESTRIL) 10 MG tablet Take 2 tablets (20 mg total) by mouth daily. 90 tablet 2  . metFORMIN (GLUCOPHAGE) 1000 MG tablet Take 1 tablet (1,000 mg total) by mouth 2 (two) times daily with a meal. 60 tablet 1  . metoprolol succinate (TOPROL-XL) 25 MG 24 hr tablet Take 2 tablets (50 mg total) by mouth daily. 90 tablet 2   No current facility-administered medications on file prior to visit.    Past Medical History  Diagnosis Date  . Hypertension   . History of echocardiogram     a. echo 01/2015: EF 60-65%, no RWMA, LA nl, nl PASP - nl study  . CAD (coronary artery  disease)     a. 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 cardiac cath 01/09/2015: ostLAD 70:, mLAD 99%, ost to pLCx 95%, mLCx 80%, LVEF nl, recommend CABGl; c. s/p 3v CABG 01/14/15 LIMA-LAD, SVG-OM, SVG-Ramus  . Morbid obesity (HCC)   . IDDM (insulin dependent diabetes mellitus) (HCC)     a. newly diagnosed 01/2015;  b. A1C 10.1% on 01/06/2015  . HLD (hyperlipidemia)   . OSA (obstructive sleep apnea)     a. does not use cpap    Past Surgical History  Procedure Laterality Date  . Hernia repair    . Cardiac catheterization N/A 01/09/2015    Procedure: Left Heart Cath and Coronary Angiography;  Surgeon: Antonieta Ibaimothy J Gollan, MD;  Location: [facility] INVASIVE CV LAB;  Service: Cardiovascular;  Laterality: N/A;  . Coronary artery bypass graft N/A 01/14/2015    Procedure: CORONARY ARTERY BYPASS GRAFTING (CABG), ON PUMP, TIMES THREE, USING LEFT INTERNAL MAMMARY ARTERY, RIGHT GREATER SAPHENOUS VEIN HARVESTED ENDOSCOPICALLY;  Surgeon: Kerin PernaPeter Van Trigt, MD;  [facility];  Service: Open Heart Surgery;  Laterality: N/A;  . Tee without cardioversion N/A 01/14/2015    Procedure: TRANSESOPHAGEAL ECHOCARDIOGRAM (TEE);  Surgeon: Kerin PernaPeter Van Trigt, MD;  [facility];  Service: Open Heart Surgery;  Laterality: N/A;    Social History  reports that he quit smoking about 40 years ago. He does not have any smokeless tobacco history on file. He reports that he does not drink alcohol.  Family History family history includes CAD in his brother and mother; Throat cancer in his father. There is no history of Diabetes.   Review of Systems  Constitutional: Negative.   Respiratory: Negative.   Cardiovascular: Negative.   Gastrointestinal: Negative.   Musculoskeletal: Negative.   Neurological: Negative.   Hematological: Negative.   Psychiatric/Behavioral: Negative.   All other systems reviewed and are negative.   BP 140/90 mmHg  Pulse 82  Ht 5\' 9"  (1.753 m)  Wt 325 lb 12.8 oz (147.782 kg)  BMI 48.09 kg/m2  SpO2 96%   Physical Exam  Constitutional: He is oriented to person, place, and time. He appears well-developed and well-nourished.  Morbid obesity  HENT:  Head: Normocephalic.  Nose: Nose normal.  Mouth/Throat: Oropharynx is clear and moist.  Eyes: Conjunctivae are normal. Pupils are equal, round, and reactive to  light.  Neck: Normal range of motion. Neck supple. No JVD present.  Cardiovascular: Normal rate, regular rhythm, normal heart sounds and intact distal pulses.  Exam reveals no gallop and no friction rub.   No murmur heard. Well healed mediastinal incision  Pulmonary/Chest: Effort normal and breath sounds normal. No respiratory distress. He has no wheezes. He has no rales. He exhibits no tenderness.  Abdominal: Soft. Bowel sounds are normal. He exhibits no distension. There is no tenderness.  Musculoskeletal: Normal range of motion. He exhibits no edema or tenderness.  Lymphadenopathy:    He has no cervical adenopathy.  Neurological: He is alert and oriented to person, place, and time. Coordination normal.  Skin: Skin is warm and dry. No rash noted. No erythema.  Psychiatric: He has a normal mood and affect. His behavior is normal. Judgment and thought content normal.

## 2015-10-10 NOTE — Patient Instructions (Signed)
You are doing well.  Please start isosorbide one a day for chest pain and high blood pressure  Please call us if you have new issues that need to be addressed before your next appt.  Your physician wants you to follow-up in: 6 months.  You will receive a reminder letter in the mail two months in advance. If you don't receive a letter, please call our office to schedule the follow-up appointment.

## 2015-10-10 NOTE — Assessment & Plan Note (Signed)
Previous hemoglobin A1c poorly controlled, 10.1 We have stressed importance of aggressive diabetes control Dietary guide provided to him, long discussion concerning changes he can make to his diet for weight loss

## 2015-10-10 NOTE — Assessment & Plan Note (Signed)
He reports continued chest pain on exertion, overall stable Unable to exclude ischemia. No escalation in symptoms over the past several months. Recommended he start isosorbide mononitrate 30 mg daily for symptom relief

## 2015-12-03 ENCOUNTER — Other Ambulatory Visit: Payer: Self-pay

## 2015-12-03 DIAGNOSIS — E119 Type 2 diabetes mellitus without complications: Secondary | ICD-10-CM

## 2015-12-03 DIAGNOSIS — Z794 Long term (current) use of insulin: Principal | ICD-10-CM

## 2015-12-04 LAB — COMPREHENSIVE METABOLIC PANEL
ALBUMIN: 4 g/dL (ref 3.5–5.5)
ALK PHOS: 47 IU/L (ref 39–117)
ALT: 60 IU/L — AB (ref 0–44)
AST: 50 IU/L — AB (ref 0–40)
Albumin/Globulin Ratio: 1.5 (ref 1.2–2.2)
BILIRUBIN TOTAL: 0.3 mg/dL (ref 0.0–1.2)
BUN / CREAT RATIO: 16 (ref 9–20)
BUN: 14 mg/dL (ref 6–24)
CHLORIDE: 95 mmol/L — AB (ref 96–106)
CO2: 24 mmol/L (ref 18–29)
Calcium: 9.6 mg/dL (ref 8.7–10.2)
Creatinine, Ser: 0.87 mg/dL (ref 0.76–1.27)
GFR calc Af Amer: 111 mL/min/{1.73_m2} (ref >59)
GFR calc non Af Amer: 96 mL/min/{1.73_m2} (ref >59)
GLUCOSE: 146 mg/dL — AB (ref 65–99)
Globulin, Total: 2.7 g/dL (ref 1.5–4.5)
Potassium: 3.9 mmol/L (ref 3.5–5.2)
Sodium: 137 mmol/L (ref 134–144)
Total Protein: 6.7 g/dL (ref 6.0–8.5)

## 2015-12-04 LAB — CBC WITH DIFFERENTIAL/PLATELET
BASOS ABS: 0 10*3/uL (ref 0.0–0.2)
Basos: 1 %
EOS (ABSOLUTE): 0.1 10*3/uL (ref 0.0–0.4)
Eos: 3 %
HEMOGLOBIN: 12 g/dL — AB (ref 12.6–17.7)
Hematocrit: 36.4 % — ABNORMAL LOW (ref 37.5–51.0)
Immature Grans (Abs): 0 10*3/uL (ref 0.0–0.1)
Immature Granulocytes: 0 %
LYMPHS ABS: 1.7 10*3/uL (ref 0.7–3.1)
Lymphs: 34 %
MCH: 27.1 pg (ref 26.6–33.0)
MCHC: 33 g/dL (ref 31.5–35.7)
MCV: 82 fL (ref 79–97)
MONOCYTES: 10 %
Monocytes Absolute: 0.5 10*3/uL (ref 0.1–0.9)
NEUTROS PCT: 52 %
Neutrophils Absolute: 2.6 10*3/uL (ref 1.4–7.0)
Platelets: 157 10*3/uL (ref 150–379)
RBC: 4.42 x10E6/uL (ref 4.14–5.80)
RDW: 15.9 % — AB (ref 12.3–15.4)
WBC: 5 10*3/uL (ref 3.4–10.8)

## 2015-12-04 LAB — LIPID PANEL
CHOL/HDL RATIO: 2.8 ratio (ref 0.0–5.0)
Cholesterol, Total: 132 mg/dL (ref 100–199)
HDL: 48 mg/dL (ref >39)
LDL CALC: 72 mg/dL (ref 0–99)
Triglycerides: 61 mg/dL (ref 0–149)
VLDL Cholesterol Cal: 12 mg/dL (ref 5–40)

## 2015-12-04 LAB — HEMOGLOBIN A1C
ESTIMATED AVERAGE GLUCOSE: 163 mg/dL
HEMOGLOBIN A1C: 7.3 % — AB (ref 4.8–5.6)

## 2015-12-10 ENCOUNTER — Ambulatory Visit: Payer: Self-pay | Admitting: Internal Medicine

## 2015-12-10 ENCOUNTER — Encounter: Payer: Self-pay | Admitting: Internal Medicine

## 2015-12-10 VITALS — BP 156/87 | HR 79 | Temp 98.1°F | Wt 300.0 lb

## 2015-12-10 DIAGNOSIS — E119 Type 2 diabetes mellitus without complications: Secondary | ICD-10-CM

## 2015-12-10 DIAGNOSIS — Z794 Long term (current) use of insulin: Principal | ICD-10-CM

## 2015-12-10 LAB — GLUCOSE, POCT (MANUAL RESULT ENTRY): POC Glucose: 154 mg/dl — AB (ref 70–99)

## 2015-12-10 NOTE — Progress Notes (Signed)
Subjective:    Patient ID: Craig Harvey, male    DOB: 06-Jul-1955, 60 y.o.   MRN: [id number]  HPI  In for follow up after seeing cardiology, Dr. Mariah MillingGollan.  Blood pressure is better.  Dr. Mariah MillingGollan stated he is cardiovascular stable.  Blood glucose is running 140's with lows in 90's.  Seen some weight loss.  Patient feels dizzy when he is outside in the heart.  He experiencing some low back pain.  Patient Active Problem List   Diagnosis Date Noted  . CAD (coronary artery disease) of artery bypass graft 07/30/2015  . Diabetes (HCC) 07/30/2015  . Hyperlipemia 07/30/2015  . HTN (hypertension) 07/30/2015  . Hypertension   . HLD (hyperlipidemia)   . IDDM (insulin dependent diabetes mellitus) (HCC)   . 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 CAD, multiple vessel 01/10/2015  . Unstable angina (HCC) 01/09/2015  . Crescendo angina (HCC)   . Chest pain on exertion   . Type 2 diabetes mellitus without complication (HCC)   . OSA (obstructive sleep apnea)   . Morbid obesity (HCC)   . Abnormal EKG   . Chest pain, central 01/05/2015   In for follow up after Dr. Mariah MillingGollan.  Was started on Isosorbide.  Cardiologist felt he is cardiovascular stable.    Review of Systems     Objective:   Physical Exam  Constitutional: He is oriented to person, place, and time.  Cardiovascular: Normal rate, regular rhythm and normal heart sounds.   Pulmonary/Chest: Effort normal and breath sounds normal.  Neurological: He is alert and oriented to person, place, and time.   BP 156/87 mmHg  Pulse 79  Temp(Src) 98.1 F (36.7 C)  Wt 300 lb (136.079 kg)   Medication List       This list is accurate as of: 12/10/15 11:20 AM.  Always use your most recent med list.               acetaminophen 650 MG CR tablet  Commonly known as:  TYLENOL  Take 1,300 mg by mouth daily as needed for pain.     aspirin 325 MG EC tablet  Take 1 tablet (325 mg total) by mouth daily.     atorvastatin 80 MG tablet  Commonly known as:   LIPITOR  Take 1 tablet (80 mg total) by mouth daily at 6 PM.     hydrochlorothiazide 25 MG tablet  Commonly known as:  HYDRODIURIL  Take 25 mg by mouth daily.     insulin NPH-regular Human (70-30) 100 UNIT/ML injection  Commonly known as:  NOVOLIN 70/30 RELION  Inject 17 Units into the skin 2 (two) times daily with a meal.     isosorbide mononitrate 30 MG 24 hr tablet  Commonly known as:  IMDUR  Take 1 tablet (30 mg total) by mouth daily.     lisinopril 10 MG tablet  Commonly known as:  PRINIVIL,ZESTRIL  Take 2 tablets (20 mg total) by mouth daily.     metFORMIN 1000 MG tablet  Commonly known as:  GLUCOPHAGE  Take 1 tablet (1,000 mg total) by mouth 2 (two) times daily with a meal.     metoprolol succinate 25 MG 24 hr tablet  Commonly known as:  TOPROL-XL  Take 2 tablets (50 mg total) by mouth daily.              Assessment & Plan:  Advised patient to continue using Tylenol for back pain. Blood pressure is stable with new  medication.  Blood glucose is good.  Patient to follow up in 3 months with labs beforehand: MetC, CBC, A1C, TSH, UA with microalbumin

## 2016-01-07 ENCOUNTER — Other Ambulatory Visit: Payer: Self-pay | Admitting: Internal Medicine

## 2016-01-28 ENCOUNTER — Other Ambulatory Visit: Payer: Self-pay | Admitting: Internal Medicine

## 2016-01-28 DIAGNOSIS — I10 Essential (primary) hypertension: Secondary | ICD-10-CM

## 2016-02-17 ENCOUNTER — Other Ambulatory Visit: Payer: Self-pay | Admitting: Internal Medicine

## 2016-03-10 ENCOUNTER — Encounter: Payer: Self-pay | Admitting: Internal Medicine

## 2016-03-10 ENCOUNTER — Ambulatory Visit: Payer: Self-pay | Admitting: Internal Medicine

## 2016-03-10 VITALS — BP 132/77 | HR 79 | Temp 98.1°F | Wt 278.0 lb

## 2016-03-10 DIAGNOSIS — E119 Type 2 diabetes mellitus without complications: Secondary | ICD-10-CM

## 2016-03-10 MED ORDER — LISINOPRIL 10 MG PO TABS: 10.0000 mg | ORAL_TABLET | Freq: Every day | ORAL | 2 refills | Status: DC

## 2016-03-10 NOTE — Progress Notes (Signed)
   Subjective:    Patient ID: Craig Harvey, male    DOB: 06-Nov-1955, 60 y.o.   MRN: [id number]  HPI  Pt presents for f/u of diabetes.  Pt reports consistent tightness in chest during physical activity.    Patient Active Problem List   Diagnosis Date Noted  . CAD (coronary artery disease) of artery bypass graft 07/30/2015  . Diabetes ([place]) 07/30/2015  . Hyperlipemia 07/30/2015  . HTN (hypertension) 07/30/2015  . Hypertension   . HLD (hyperlipidemia)   . IDDM (insulin dependent diabetes mellitus) ([place])   . 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 CAD, multiple vessel 01/10/2015  . Unstable angina ([place]) 01/09/2015  . Crescendo angina ([place])   . Chest pain on exertion   . Type 2 diabetes mellitus without complication ([place])   . OSA (obstructive sleep apnea)   . Morbid obesity ([place])   . Abnormal EKG   . Chest pain, central 01/05/2015     Medication List       Accurate as of 03/10/16 11:24 AM. Always use your most recent med list.          acetaminophen 650 MG CR tablet Commonly known as:  TYLENOL Take 1,300 mg by mouth every 8 (eight) hours as needed for pain.   aspirin 325 MG tablet Take 325 mg by mouth daily.   atorvastatin 80 MG tablet Commonly known as:  LIPITOR Take 80 mg by mouth daily.   hydrochlorothiazide 25 MG tablet Commonly known as:  HYDRODIURIL Take 25 mg by mouth daily.   insulin NPH-regular Human (70-30) 100 UNIT/ML injection Commonly known as:  NOVOLIN 70/30 Inject 17 Units into the skin.   isosorbide mononitrate 30 MG 24 hr tablet Commonly known as:  IMDUR Take 30 mg by mouth daily.   lisinopril 10 MG tablet Commonly known as:  PRINIVIL,ZESTRIL Take 10 mg by mouth daily.   metFORMIN 1000 MG tablet Commonly known as:  GLUCOPHAGE Take 1,000 mg by mouth 2 (two) times daily with a meal.   metoprolol tartrate 25 MG tablet Commonly known as:  LOPRESSOR Take 25 mg by mouth 2 (two) times daily.        Review of Systems     Objective:   Physical  Exam  Constitutional: He is oriented to person, place, and time.  Cardiovascular: Normal rate, regular rhythm and normal heart sounds.   Pulmonary/Chest: Effort normal and breath sounds normal.  Neurological: He is alert and oriented to person, place, and time.    BP 132/77 (BP Location: Left Arm, Patient Position: Sitting, Cuff Size: Large)   Pulse 79   Temp 98.1 F (36.7 C) (Oral)   Wt 278 lb (126.1 kg)   BMI 41.05 kg/m        Assessment & Plan:  Labs today: Met C, CBC, Lipid, A1C, TSH, PSA, UA F/u in 3 months w/ labs: Met C, CBC, Lipid, A1C

## 2016-03-10 NOTE — Patient Instructions (Signed)
Labs today: Met C, CBC, Lipid, A1C, TSH, PSA, UA F/u in 3 months w/ labs: Met C, CBC, Lipid, A1C

## 2016-03-11 LAB — LIPID PANEL
CHOLESTEROL TOTAL: 144 mg/dL (ref 100–199)
Chol/HDL Ratio: 2.9 ratio units (ref 0.0–5.0)
HDL: 50 mg/dL (ref >39)
LDL Calculated: 76 mg/dL (ref 0–99)
TRIGLYCERIDES: 92 mg/dL (ref 0–149)
VLDL Cholesterol Cal: 18 mg/dL (ref 5–40)

## 2016-03-11 LAB — COMPREHENSIVE METABOLIC PANEL
A/G RATIO: 1.4 (ref 1.2–2.2)
ALT: 75 IU/L — AB (ref 0–44)
AST: 54 IU/L — ABNORMAL HIGH (ref 0–40)
Albumin: 4.4 g/dL (ref 3.5–5.5)
Alkaline Phosphatase: 58 IU/L (ref 39–117)
BILIRUBIN TOTAL: 0.4 mg/dL (ref 0.0–1.2)
BUN / CREAT RATIO: 13 (ref 9–20)
BUN: 16 mg/dL (ref 6–24)
CHLORIDE: 94 mmol/L — AB (ref 96–106)
CO2: 23 mmol/L (ref 18–29)
Calcium: 10.2 mg/dL (ref 8.7–10.2)
Creatinine, Ser: 1.2 mg/dL (ref 0.76–1.27)
GFR calc non Af Amer: 67 mL/min/{1.73_m2} (ref >59)
GFR, EST AFRICAN AMERICAN: 77 mL/min/{1.73_m2} (ref >59)
Globulin, Total: 3.2 g/dL (ref 1.5–4.5)
Glucose: 146 mg/dL — ABNORMAL HIGH (ref 65–99)
POTASSIUM: 4.2 mmol/L (ref 3.5–5.2)
SODIUM: 135 mmol/L (ref 134–144)
TOTAL PROTEIN: 7.6 g/dL (ref 6.0–8.5)

## 2016-03-11 LAB — TSH: TSH: 1.27 u[IU]/mL (ref 0.450–4.500)

## 2016-03-11 LAB — URINALYSIS
BILIRUBIN UA: NEGATIVE
GLUCOSE, UA: NEGATIVE
KETONES UA: NEGATIVE
Leukocytes, UA: NEGATIVE
Nitrite, UA: NEGATIVE
Protein, UA: NEGATIVE
RBC, UA: NEGATIVE
SPEC GRAV UA: 1.018 (ref 1.005–1.030)
UUROB: 0.2 mg/dL (ref 0.2–1.0)
pH, UA: 5.5 (ref 5.0–7.5)

## 2016-03-11 LAB — CBC
HEMOGLOBIN: 13.5 g/dL (ref 12.6–17.7)
Hematocrit: 41.2 % (ref 37.5–51.0)
MCH: 27.4 pg (ref 26.6–33.0)
MCHC: 32.8 g/dL (ref 31.5–35.7)
MCV: 84 fL (ref 79–97)
PLATELETS: 186 10*3/uL (ref 150–379)
RBC: 4.92 x10E6/uL (ref 4.14–5.80)
RDW: 16.2 % — AB (ref 12.3–15.4)
WBC: 5.4 10*3/uL (ref 3.4–10.8)

## 2016-03-11 LAB — HEMOGLOBIN A1C
Est. average glucose Bld gHb Est-mCnc: 157 mg/dL
Hgb A1c MFr Bld: 7.1 % — ABNORMAL HIGH (ref 4.8–5.6)

## 2016-03-11 LAB — PSA: PROSTATE SPECIFIC AG, SERUM: 0.4 ng/mL (ref 0.0–4.0)

## 2016-04-08 ENCOUNTER — Other Ambulatory Visit: Payer: Self-pay | Admitting: Internal Medicine

## 2016-04-08 DIAGNOSIS — I10 Essential (primary) hypertension: Secondary | ICD-10-CM

## 2016-04-08 MED ORDER — INSULIN NPH ISOPHANE & REGULAR (70-30) 100 UNIT/ML ~~LOC~~ SUSP: 17.0000 [IU] | Freq: Two times a day (BID) | SUBCUTANEOUS | 11 refills | Status: DC

## 2016-05-05 ENCOUNTER — Other Ambulatory Visit: Payer: Self-pay | Admitting: Internal Medicine

## 2016-05-05 DIAGNOSIS — I10 Essential (primary) hypertension: Secondary | ICD-10-CM

## 2016-05-10 ENCOUNTER — Ambulatory Visit: Payer: Medicaid Other | Admitting: Pharmacist

## 2016-05-10 ENCOUNTER — Ambulatory Visit: Payer: Medicaid Other | Admitting: Pharmacy Technician

## 2016-05-10 ENCOUNTER — Encounter: Payer: Self-pay | Admitting: Pharmacist

## 2016-05-10 VITALS — BP 133/75 | HR 88 | Ht 70.0 in

## 2016-05-10 DIAGNOSIS — Z79899 Other long term (current) drug therapy: Secondary | ICD-10-CM

## 2016-05-10 NOTE — Progress Notes (Addendum)
[facility] Clinic Visit Note  Patient: Craig ChallengerDavid L Zelaya MRN: [id number] Date of Birth: [birth year] PCP: Default, Provider, MD   Craig Challengeravid L Ambrosino 60 y.o. male presents for a MTM visit today.  BP 133/75 Comment: Patient reported BP  Pulse 88   Ht 5\' 10"  (1.778 m)   Patient Information   Past Medical History:  Diagnosis Date  . CAD (coronary artery disease)    a. 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 cardiac cath 01/09/2015: ostLAD 70:, mLAD 99%, ost to pLCx 95%, mLCx 80%, LVEF nl, recommend CABGl; c. s/p 3v CABG 01/14/15 LIMA-LAD, SVG-OM, SVG-Ramus  . History of echocardiogram    a. echo 01/2015: EF 60-65%, no RWMA, LA nl, nl PASP - nl study  . HLD (hyperlipidemia)   . Hypertension   . IDDM (insulin dependent diabetes mellitus) (HCC)    a. newly diagnosed 01/2015; b. A1C 10.1% on 01/06/2015  . Morbid obesity (HCC)   . OSA (obstructive sleep apnea)    a. does not use cpap      Past Surgical History:  Procedure Laterality Date  . CARDIAC CATHETERIZATION N/A 01/09/2015   Procedure: Left Heart Cath and Coronary Angiography;  Surgeon: Antonieta Ibaimothy J Gollan, MD;  Location: [facility] INVASIVE CV LAB;  Service: Cardiovascular;  Laterality: N/A;  . CORONARY ARTERY BYPASS GRAFT N/A 01/14/2015   Procedure: CORONARY ARTERY BYPASS GRAFTING (CABG), ON PUMP, TIMES THREE, USING LEFT INTERNAL MAMMARY ARTERY, RIGHT GREATER SAPHENOUS VEIN HARVESTED ENDOSCOPICALLY;  Surgeon: Kerin PernaPeter Van Trigt, MD;  [facility];  Service: Open Heart Surgery;  Laterality: N/A;  . HERNIA REPAIR    . TEE WITHOUT CARDIOVERSION N/A 01/14/2015   Procedure: TRANSESOPHAGEAL ECHOCARDIOGRAM (TEE);  Surgeon: Kerin PernaPeter Van Trigt, MD;  [facility];  Service: Open Heart Surgery;  Laterality: N/A;     Family History  Problem Relation Age of  Onset  . CAD Mother   . CAD Brother   . Throat cancer Father   . Diabetes Neg Hx     New Diagnoses (since last visit):   Family Support: Good  Lifestyle Diet: Breakfast: egg whites, [place]key bacon, peaches Lunch: baked chicken, green beans, broccoli, whole wheat bread Dinner: baked fish, broccoli, sweet potatoes tots, chicken, [place]urkey, green beans, baked beans, chef salad Drinks: diet soda (Coke), unsweetened tea, water Snack: no sugar ice cream    Current Exercise Habits: Home exercise routine, Type of exercise: walking, Time (Minutes): 30, Frequency (Times/Week): 3, Weekly Exercise (Minutes/Week): 90, Intensity: Mild  Exercise limited by: Other - see comments    History  Alcohol Use No      History  Smoking Status  . Former Smoker  . Quit date: 08/06/1975  Smokeless Tobacco  . Never Used      Health Maintenance  Topic Date Due  . Hepatitis C Screening  1955/11/28  . FOOT EXAM  06/17/1968  . OPHTHALMOLOGY EXAM  06/17/1968  . HIV Screening  06/17/1973  . TETANUS/TDAP  06/17/1977  . COLONOSCOPY  06/17/2008  . HEMOGLOBIN A1C  09/07/2016  . PNEUMOCOCCAL POLYSACCHARIDE VACCINE (2) 01/12/2020  . INFLUENZA VACCINE  Completed   Prior to Admission medications   Medication Sig Start Date End Date Taking? Authorizing Provider  acetaminophen (TYLENOL) 650 MG CR tablet Take 1,300 mg by mouth every 8 (eight) hours as needed for pain.  Yes Historical Provider, MD  aspirin 325 MG tablet Take 325 mg by mouth daily.   Yes Historical Provider, MD  atorvastatin (LIPITOR) 80 MG tablet Take 80 mg by mouth daily.   Yes Historical Provider, MD  hydrochlorothiazide (HYDRODIURIL) 25 MG tablet TAKE ONE TABLET BY MOUTH EVERY DAY FOR BLOOD PRESSURE. 04/08/16  Yes Virl Axeon C Chaplin, MD  insulin NPH-regular Human (NOVOLIN 70/30) (70-30) 100 UNIT/ML injection Inject 17 Units into the skin 2 (two) times daily with a meal. 04/08/16  Yes Shannon A McGowan, PA-C  isosorbide mononitrate (IMDUR) 30 MG 24 hr  tablet Take 30 mg by mouth daily.   Yes Historical Provider, MD  lisinopril (PRINIVIL,ZESTRIL) 10 MG tablet Take 1 tablet (10 mg total) by mouth daily. 03/10/16  Yes Virl Axeon C Chaplin, MD  metFORMIN (GLUCOPHAGE) 1000 MG tablet Take 1,000 mg by mouth 2 (two) times daily with a meal.   Yes Historical Provider, MD  metoprolol succinate (TOPROL-XL) 50 MG 24 hr tablet TAKE 1 TABLET BY MOUTH DAILY 05/05/16  Yes Virl Axeon C Chaplin, MD    Assessment and Plan: CAD/Angina: Pt has had no recent complaints of chest pain. Currently taking aspirin, metoprolol, and isosorbide mononitrate.  DM: Last A1C in September was 7.1%, which has decreased from his previous readings of 7.3% (12/03/15) and 10.1% (01/06/15). Goal A1c <7%. He checks his blood sugar about 3-4x a day, but has recently started to check 1x/day per the doctors orders. Patient has been getting lightheaded 4-6hrs after he takes his medicine in the morning. Informed patient to check his blood sugar when this happened. If blood sugar is <70 or pt has signs of hypoglycemia to correct with glass of orange juice or to chew hard candy in mouth. Patients average readings are: AM before breakfast 120s, lunch 140-154, dinner 133, bedtime 98. Encouraged patient to keep walking, eating healthier, and taking Novolin 70/30 insulin and metformin. HLD: Controlled. Labs are WNL. Patient has been walking, eating healthier, and takes atorvastatin 80mg .  HTN: BP 151/103 with a pulse of 88. BP cuff may have shown an elevated reading as patient reported a BP of 131/75 last night. He recently bought a BP cuff to check is BP more often. Encouraged him to continue to check BP, exercise, eat healthy (Not adding salt), and take medications (HCTZ, lisinopril, metoprolol, and isosorbide) OSA: Pt has a CPAP machine at home, but does not wear it every night. He can still tell at times that he is not breathing when he falls asleep. He would like a smaller CPAP machine which may help with compliance.  Told pt to talk to his doctor about CPAP machine.  Exercise: Pt has noticed that when he walks for about an hour or longer that he starts to get lightheaded. Informed patient to check sugar and BP when this happens.   Next PCP appointment December 13.  Will follow-up with the patient in 3 months.   Ivory BroadKelly m Flat RockFuhrmann, [facility]PH 4:47 PM 05/10/2016

## 2016-06-03 ENCOUNTER — Encounter: Payer: Medicaid Other | Admitting: Pharmacist

## 2016-06-09 ENCOUNTER — Other Ambulatory Visit: Payer: Self-pay

## 2016-06-09 ENCOUNTER — Other Ambulatory Visit: Payer: Self-pay | Admitting: Internal Medicine

## 2016-06-09 DIAGNOSIS — Z794 Long term (current) use of insulin: Principal | ICD-10-CM

## 2016-06-09 DIAGNOSIS — E119 Type 2 diabetes mellitus without complications: Secondary | ICD-10-CM

## 2016-06-10 LAB — HEMOGLOBIN A1C
Est. average glucose Bld gHb Est-mCnc: 171 mg/dL
Hgb A1c MFr Bld: 7.6 % — ABNORMAL HIGH (ref 4.8–5.6)

## 2016-06-10 LAB — CBC WITH DIFFERENTIAL/PLATELET
Basophils Absolute: 0.1 10*3/uL (ref 0.0–0.2)
Basos: 1 %
EOS (ABSOLUTE): 0.2 10*3/uL (ref 0.0–0.4)
Eos: 3 %
Hematocrit: 41.5 % (ref 37.5–51.0)
Hemoglobin: 13.9 g/dL (ref 13.0–17.7)
Immature Grans (Abs): 0 10*3/uL (ref 0.0–0.1)
Immature Granulocytes: 0 %
Lymphocytes Absolute: 1.6 10*3/uL (ref 0.7–3.1)
Lymphs: 35 %
MCH: 27.7 pg (ref 26.6–33.0)
MCHC: 33.5 g/dL (ref 31.5–35.7)
MCV: 83 fL (ref 79–97)
Monocytes Absolute: 0.5 10*3/uL (ref 0.1–0.9)
Monocytes: 10 %
Neutrophils Absolute: 2.3 10*3/uL (ref 1.4–7.0)
Neutrophils: 51 %
Platelets: 167 10*3/uL (ref 150–379)
RBC: 5.02 x10E6/uL (ref 4.14–5.80)
RDW: 16.6 % — ABNORMAL HIGH (ref 12.3–15.4)
WBC: 4.7 10*3/uL (ref 3.4–10.8)

## 2016-06-10 LAB — COMPREHENSIVE METABOLIC PANEL WITH GFR
ALT: 56 [IU]/L — ABNORMAL HIGH (ref 0–44)
AST: 39 [IU]/L (ref 0–40)
Albumin/Globulin Ratio: 1.6 (ref 1.2–2.2)
Albumin: 4.4 g/dL (ref 3.5–5.5)
Alkaline Phosphatase: 66 [IU]/L (ref 39–117)
BUN/Creatinine Ratio: 18 (ref 9–20)
BUN: 16 mg/dL (ref 6–24)
Bilirubin Total: 0.7 mg/dL (ref 0.0–1.2)
CO2: 28 mmol/L (ref 18–29)
Calcium: 10.2 mg/dL (ref 8.7–10.2)
Chloride: 92 mmol/L — ABNORMAL LOW (ref 96–106)
Creatinine, Ser: 0.89 mg/dL (ref 0.76–1.27)
GFR calc Af Amer: 110 mL/min/{1.73_m2}
GFR calc non Af Amer: 95 mL/min/{1.73_m2}
Globulin, Total: 2.7 g/dL (ref 1.5–4.5)
Glucose: 156 mg/dL — ABNORMAL HIGH (ref 65–99)
Potassium: 4.4 mmol/L (ref 3.5–5.2)
Sodium: 136 mmol/L (ref 134–144)
Total Protein: 7.1 g/dL (ref 6.0–8.5)

## 2016-06-10 LAB — LIPID PANEL
Chol/HDL Ratio: 3.4 ratio (ref 0.0–5.0)
Cholesterol, Total: 158 mg/dL (ref 100–199)
HDL: 46 mg/dL
LDL Calculated: 93 mg/dL (ref 0–99)
Triglycerides: 95 mg/dL (ref 0–149)
VLDL Cholesterol Cal: 19 mg/dL (ref 5–40)

## 2016-06-10 LAB — MICROALBUMIN / CREATININE URINE RATIO
Creatinine, Urine: 91.4 mg/dL
MICROALB/CREAT RATIO: 9.6 mg/g{creat} (ref 0.0–30.0)
Microalbumin, Urine: 8.8 ug/mL

## 2016-06-10 LAB — TSH: TSH: 1.42 u[IU]/mL (ref 0.450–4.500)

## 2016-06-16 ENCOUNTER — Ambulatory Visit: Payer: Self-pay | Admitting: Internal Medicine

## 2016-06-16 ENCOUNTER — Encounter: Payer: Self-pay | Admitting: Internal Medicine

## 2016-06-16 VITALS — BP 150/91 | HR 99 | Temp 98.4°F | Wt 255.0 lb

## 2016-06-16 DIAGNOSIS — E119 Type 2 diabetes mellitus without complications: Secondary | ICD-10-CM

## 2016-06-16 DIAGNOSIS — M67432 Ganglion, left wrist: Secondary | ICD-10-CM

## 2016-06-16 DIAGNOSIS — Z794 Long term (current) use of insulin: Principal | ICD-10-CM

## 2016-06-16 LAB — GLUCOSE, POCT (MANUAL RESULT ENTRY): POC Glucose: 185 mg/dl — AB (ref 70–99)

## 2016-06-16 MED ORDER — METFORMIN HCL 1000 MG PO TABS: 1000.0000 mg | ORAL_TABLET | Freq: Two times a day (BID) | ORAL | 3 refills | Status: DC

## 2016-06-16 NOTE — Progress Notes (Signed)
   Subjective:    Patient ID: Craig Harvey, male    DOB: 1956/03/17, 60 y.o.   MRN: [id number]  HPI   Pt f/u for labs, diabetes, and hypertension Pt chief complaint today is left wrist pain. Presents with a nodule on wrist.  Pt weight has decreased to 255lb from 300lb from last visit. Pt reports has changed diet.  Pt reports has CPAC for sleep apnea.   Patient Active Problem List   Diagnosis Date Noted  . CAD (coronary artery disease) of artery bypass graft 07/30/2015  . Diabetes ([place]) 07/30/2015  . Hyperlipemia 07/30/2015  . HTN (hypertension) 07/30/2015  . Hypertension   . HLD (hyperlipidemia)   . IDDM (insulin dependent diabetes mellitus) ([place])   . 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 CAD, multiple vessel 01/10/2015  . Unstable angina ([place]) 01/09/2015  . Crescendo angina ([place])   . Chest pain on exertion   . Type 2 diabetes mellitus without complication ([place])   . OSA (obstructive sleep apnea)   . Morbid obesity ([place])   . Abnormal EKG   . Chest pain, central 01/05/2015     Medication List       Accurate as of 06/16/16 10:57 AM. Always use your most recent med list.          aspirin 325 MG tablet Take 325 mg by mouth daily.   atorvastatin 80 MG tablet Commonly known as:  LIPITOR TAKE ONE TABLET BY MOUTH EVERY DAY   hydrochlorothiazide 25 MG tablet Commonly known as:  HYDRODIURIL TAKE ONE TABLET BY MOUTH EVERY DAY FOR BLOOD PRESSURE.   insulin NPH-regular Human (70-30) 100 UNIT/ML injection Commonly known as:  NOVOLIN 70/30 Inject 17 Units into the skin 2 (two) times daily with a meal.   isosorbide mononitrate 30 MG 24 hr tablet Commonly known as:  IMDUR Take 30 mg by mouth daily.   lisinopril 10 MG tablet Commonly known as:  PRINIVIL,ZESTRIL Take 1 tablet (10 mg total) by mouth daily.   metFORMIN 1000 MG tablet Commonly known as:  GLUCOPHAGE Take 1,000 mg by mouth 2 (two) times daily with a meal.   metoprolol succinate 50 MG 24 hr tablet Commonly known as:   TOPROL-XL TAKE 1 TABLET BY MOUTH DAILY        Review of Systems     Objective:   Physical Exam  Constitutional: He is oriented to person, place, and time.  Cardiovascular: Normal rate, regular rhythm and normal heart sounds.   Pulmonary/Chest: Effort normal and breath sounds normal.  Neurological: He is alert and oriented to person, place, and time.    BP (!) 150/91   Pulse 99   Temp 98.4 F (36.9 C) (Oral)   Wt 255 lb (115.7 kg)   BMI 36.59 kg/m   Probable ganglion on left wrist. Pt needs x-ray and orthopedic assessment.  Right foot has good pulse and looks normal.     Assessment & Plan:   Referral for left wrist x-ray. Referral to Dr. Vickki Hearing for assessment of left wrist.   F/u in 3 months w/ labs: Met C, CBC, A1C, UA

## 2016-06-16 NOTE — Patient Instructions (Addendum)
Referral for x-ray of left wrist Referral to Dr. Justice RocherFossier for assessment of left wrist.   F/u in 3 months w/ labs

## 2016-06-30 ENCOUNTER — Encounter: Payer: Self-pay | Admitting: Specialist

## 2016-07-01 ENCOUNTER — Telehealth: Payer: Self-pay

## 2016-07-01 ENCOUNTER — Telehealth: Payer: Self-pay | Admitting: Pharmacist

## 2016-07-01 NOTE — Telephone Encounter (Signed)
[employer]ed [employer] Re Enrollment application for Novolin 70/30--inject 17 units under the skin two times a day, total max daily dose 34 units.

## 2016-07-01 NOTE — Telephone Encounter (Signed)
[facility] per Dr. Justice RocherFossier.

## 2016-07-20 DIAGNOSIS — E1165 Type 2 diabetes mellitus with hyperglycemia: Secondary | ICD-10-CM | POA: Diagnosis not present

## 2016-07-20 DIAGNOSIS — I2510 Atherosclerotic heart disease of native coronary artery without angina pectoris: Secondary | ICD-10-CM | POA: Diagnosis not present

## 2016-07-20 DIAGNOSIS — I119 Hypertensive heart disease without heart failure: Secondary | ICD-10-CM | POA: Diagnosis not present

## 2016-07-20 DIAGNOSIS — I252 Old myocardial infarction: Secondary | ICD-10-CM | POA: Diagnosis not present

## 2016-09-15 ENCOUNTER — Other Ambulatory Visit: Payer: Self-pay

## 2016-09-22 ENCOUNTER — Ambulatory Visit: Payer: Self-pay | Admitting: Internal Medicine

## 2016-09-29 ENCOUNTER — Ambulatory Visit: Payer: Self-pay | Admitting: Internal Medicine

## 2016-11-30 IMAGING — CR DG CHEST 1V PORT
1 series · 1 of 1 positions shown · non-contrast
Comparison: None.

CLINICAL DATA: Intermittent chest pain for 3 days. History of
hypertension.

EXAM:
PORTABLE CHEST - 1 VIEW

[portable]
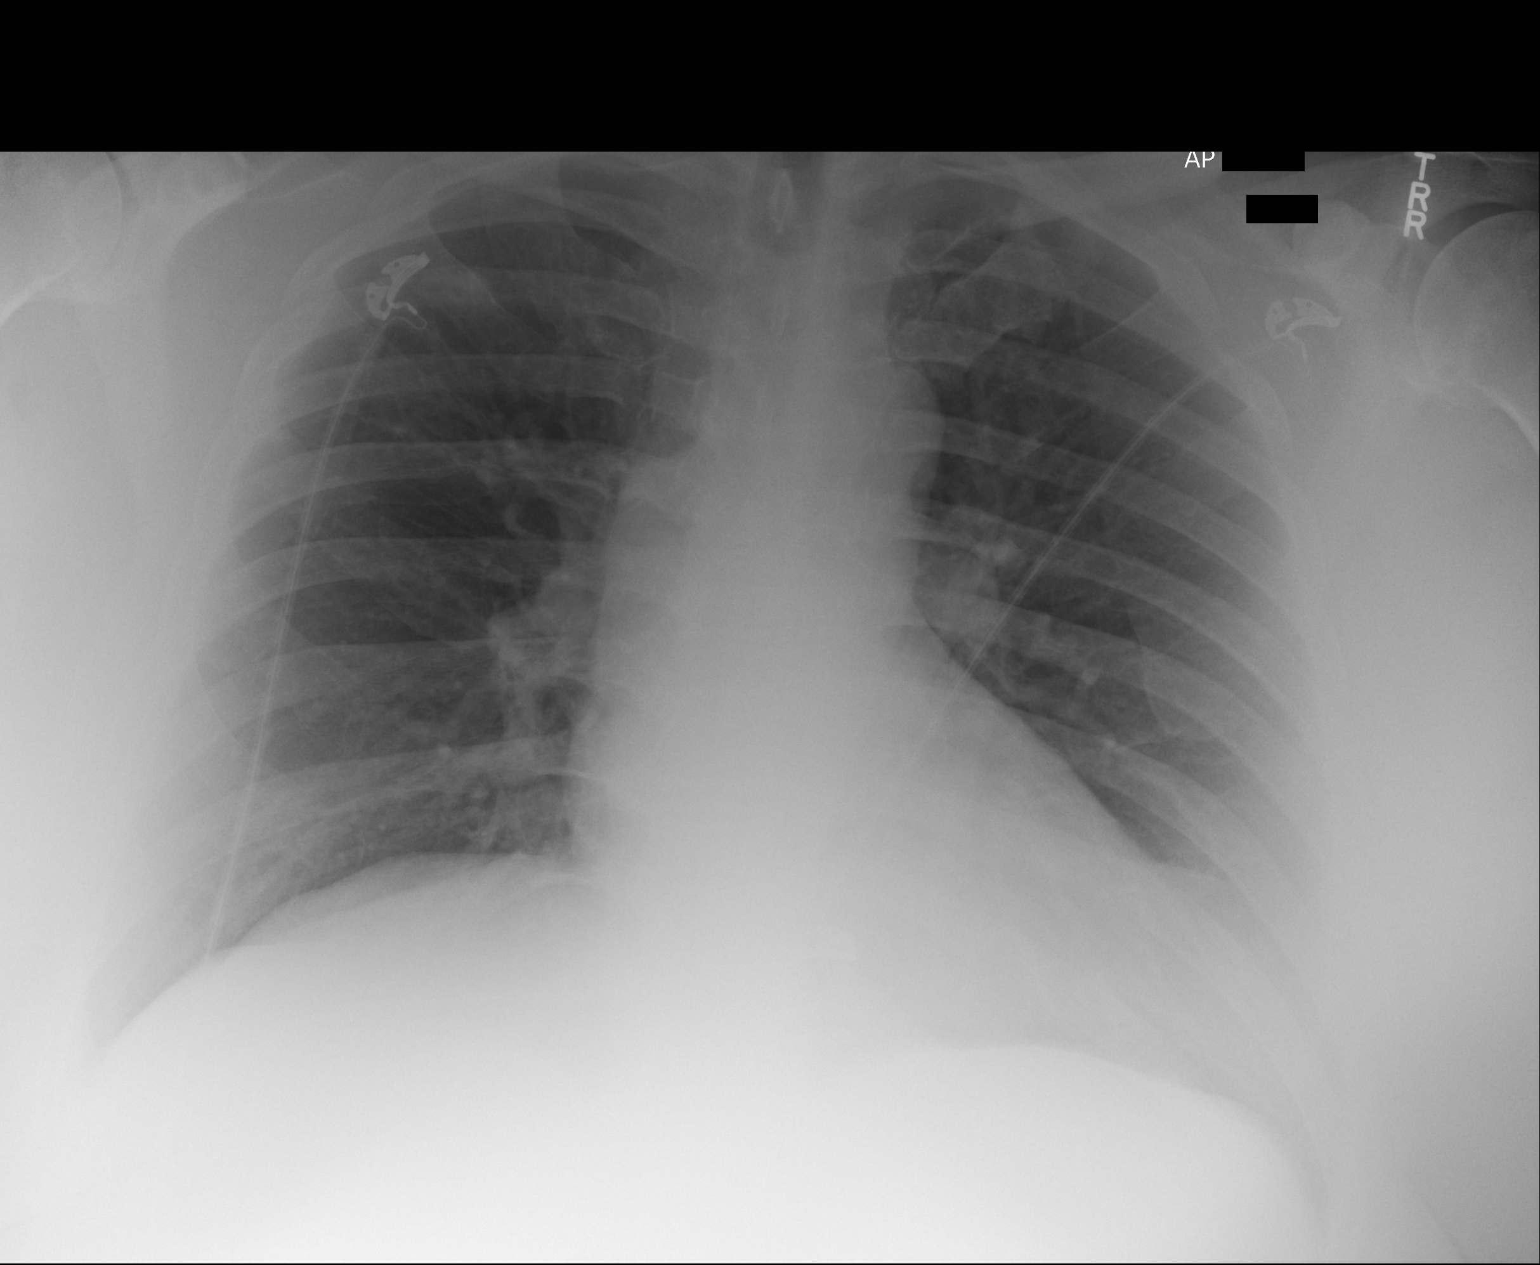

[1 of 1 positions shown; findings below may reference images not displayed]

FINDINGS: The heart size and mediastinal contours are within normal limits.
Both lungs are clear. The visualized skeletal structures are
unremarkable.
IMPRESSION: Normal portable chest radiograph.

## 2016-12-20 LAB — BASIC METABOLIC PANEL
BUN: 18 (ref 4–21)
Creatinine: 1.1 (ref 0.6–1.3)
Glucose: 110
POTASSIUM: 4.3 (ref 3.4–5.3)
SODIUM: 139 (ref 137–147)

## 2016-12-20 LAB — CALCIUM
Calcium: 9.5
Carbon Dioxide, Total: 18 — AB (ref 20–29)

## 2016-12-20 LAB — LIPID PANEL
CHOLESTEROL: 151 (ref 0–200)
HDL: 43 (ref 35–70)
LDL CALC: 91
TRIGLYCERIDES: 87 (ref 40–160)

## 2016-12-20 LAB — HEMOGLOBIN A1C: Hemoglobin A1C: 6.5

## 2017-01-23 NOTE — Progress Notes (Signed)
Cardiology Office Note  Date:  01/25/2017   ID:  Craig Harvey, DOB 01/20/1956, MRN [id number]  PCP:  Default, Provider, MD   Chief Complaint  Patient presents with  . other    over due 6 month follow up. Patient c/o SOB and chest tightness. Patient was last seen 10/10/2015. Meds reviewed verbally with patient.     HPI:  61 year old male with known history of  HTN,  obesity,  OSA not on CPAP,  CAD s/p 3 vessel CABG on 01/14/2015,  DM,  HLD who presents for routine follow-up of his coronary artery disease [facility]  In follow-up today he reports that he is doing well  rece[facility] Feels his chest is tight ever since the surgery Notices it most when he walks long distances, crosses his arms at rest, carries heavy items Mild chronic shortness of breath on exertion Denies having significant leg edema Working on his diet for weight loss  Lab work reviewed with him Manage by Dr. Maryellen PileEason HBA1C 7.6   (6.4?) Most recent LDL 98 in 2017  EKG on today's visit shows normal sinus rhythm with rate 97 bpm with no significant ST-T wave changes  Other past medical history [facility]2015 with complaints of nausea, feeling hot, and chest pain with radiation into his left shoulder. EKG was unremarkable in ED and Cardiac enzymes were negative.  Echo on 7/4 showed EF 60-65%, no regional wall motion abnormalities, no valvular abnormalities, PASP normal, normal study.  He underwent nuclear stress test that showed large defect of moderate severity in the mid anterior, mid anteroseptal, mid anterolateral, apical anterior, apical lateral and apex locations. F   underwent cardiac catheterization which showed severe 2 vessel CAD (ost LAD 70%, mid LAD 99%, ost LCx to prox LCx 95% after the take off of OM1, mid LCx 80% prior to takeoff to OM2, normal LV function).It was felt CABG would be indicated and the patient was  transferred [facility] care.   He underwent successful 3 vessel CABG (LIMA-LAD, SVG-OM, SVG-Ramus) on 01/14/2015. Post-op he developed Afib and converted on IV amiodarone. His A1C was poorly controlled coming in at 10.0%.    PMH:   has a past medical history of CAD (coronary artery disease); History of echocardiogram; HLD (hyperlipidemia); Hypertension; IDDM (insulin dependent diabetes mellitus) (HCC); Morbid obesity (HCC); and OSA (obstructive sleep apnea).  PSH:    Past Surgical History:  Procedure Laterality Date  . CARDIAC CATHETERIZATION N/A 01/09/2015   Procedure: Left Heart Cath and Coronary Angiography;  Surgeon: Antonieta Ibaimothy J Gollan, MD;  Location: [facility] INVASIVE CV LAB;  Service: Cardiovascular;  Laterality: N/A;  . CORONARY ARTERY BYPASS GRAFT N/A 01/14/2015   Procedure: CORONARY ARTERY BYPASS GRAFTING (CABG), ON PUMP, TIMES THREE, USING LEFT INTERNAL MAMMARY ARTERY, RIGHT GREATER SAPHENOUS VEIN HARVESTED ENDOSCOPICALLY;  Surgeon: Kerin PernaPeter Van Trigt, MD;  [facility];  Service: Open Heart Surgery;  Laterality: N/A;  . HERNIA REPAIR    . TEE WITHOUT CARDIOVERSION N/A 01/14/2015   Procedure: TRANSESOPHAGEAL ECHOCARDIOGRAM (TEE);  Surgeon: Kerin PernaPeter Van Trigt, MD;  [facility];  Service: Open Heart Surgery;  Laterality: N/A;    Current Outpatient Prescriptions  Medication Sig Dispense Refill  . aspirin 325 MG tablet Take 325 mg by mouth daily.    Marland Kitchen. atorvastatin (LIPITOR) 80 MG tablet Take 1 tablet (80 mg total) by mouth daily. 90 tablet 4  .  hydrochlorothiazide (HYDRODIURIL) 25 MG tablet TAKE ONE TABLET BY MOUTH EVERY DAY FOR BLOOD PRESSURE. 90 tablet 3  . insulin NPH-regular Human (NOVOLIN 70/30) (70-30) 100 UNIT/ML injection Inject 17 Units into the skin 2 (two) times daily with a meal. 10 mL 11  . isosorbide mononitrate (IMDUR) 30 MG 24 hr tablet Take 30 mg by mouth daily.    Marland Kitchen lisinopril (PRINIVIL,ZESTRIL) 10 MG tablet Take 1 tablet (10 mg total) by mouth daily. 180  tablet 2  . metFORMIN (GLUCOPHAGE) 1000 MG tablet Take 1 tablet (1,000 mg total) by mouth 2 (two) times daily with a meal. 180 tablet 3  . metoprolol succinate (TOPROL-XL) 50 MG 24 hr tablet TAKE 1 TABLET BY MOUTH DAILY 90 tablet 0   No current facility-administered medications for this visit.      Allergies:   Patient has no known allergies.   Social History:  The patient  reports that he quit smoking about 41 years ago. He has never used smokeless tobacco. He reports that he does not drink alcohol or use drugs.   Family History:   family history includes CAD in his brother and mother; Throat cancer in his father.    Review of Systems: Review of Systems  Constitutional: Negative.   Respiratory: Negative.   Cardiovascular: Negative.   Gastrointestinal: Negative.   Musculoskeletal: Negative.        Chest tightness with carrying heavy items, sitting too long with arms crossed  Neurological: Negative.   Psychiatric/Behavioral: Negative.   All other systems reviewed and are negative.    PHYSICAL EXAM: VS:  BP 112/70 (BP Location: Left Arm, Patient Position: Sitting, Cuff Size: Large)   Pulse 97   Ht 5\' 9"  (1.753 m)   Wt 271 lb 12 oz (123.3 kg)   BMI 40.13 kg/m  , BMI Body mass index is 40.13 kg/m.  GEN: Well nourished, well developed, in no acute distress, obese  HEENT: normal  Neck: no JVD, carotid bruits, or masses Cardiac: RRR; no murmurs, rubs, or gallops,no edema  Respiratory:  clear to auscultation bilaterally, normal work of breathing GI: soft, nontender, nondistended, + BS MS: no deformity or atrophy  Skin: warm and dry, no rash Neuro:  Strength and sensation are intact Psych: euthymic mood, full affect    Recent Labs: 06/09/2016: ALT 56; BUN 16; Creatinine, Ser 0.89; Hemoglobin 13.9; Platelets 167; Potassium 4.4; Sodium 136; TSH 1.420    Lipid Panel Lab Results  Component Value Date   CHOL 158 06/09/2016   HDL 46 06/09/2016   LDLCALC 93 06/09/2016    TRIG 95 06/09/2016      Wt Readings from Last 3 Encounters:  01/25/17 271 lb 12 oz (123.3 kg)  06/16/16 255 lb (115.7 kg)  03/10/16 278 lb (126.1 kg)       ASSESSMENT AND PLAN:  Coronary artery disease of bypass graft of native heart with stable angina pectoris (HCC) - Plan: EKG 12-Lead Currently with no symptoms of angina. No further workup at this time. Continue current medication regimen. He is having atypical chest pain consistent with muscular ligamental disorder following bypass No further testing needed  Mixed hyperlipidemia - Plan: EKG 12-Lead Long discussion concerning lipid goals Suggested he add Zetia to his regiment to achieve goal LDL less than 70 He would like to talk with Dr. Maryellen Pile first  Essential hypertension - Plan: EKG 12-Lead Blood pressure is well controlled on today's visit. No changes made to the medications.  Type 2 diabetes mellitus without complication,  with long-term current use of insulin (HCC) - Plan: EKG 12-Lead Most recent hemoglobin A we have is in the 7 range, he reports it is in the 6 range Dietary guide provided, weight loss recommended  Morbid obesity (HCC) - Plan: EKG 12-Lead  dietary guide provided We have encouraged continued exercise, careful diet management in an effort to lose weight.  Disposition:   F/U  12 months   Total encounter time more than 25 minutes  Greater than 50% was spent in counseling and coordination of care with the patient   Orders Placed This Encounter  Procedures  . EKG 12-Lead     Signed, Dossie Arbour, M.D., Ph.D. 01/25/2017  [facility] [place], [place] [phone]

## 2017-01-25 ENCOUNTER — Encounter: Payer: Self-pay | Admitting: Cardiovascular Disease

## 2017-01-25 ENCOUNTER — Ambulatory Visit (INDEPENDENT_AMBULATORY_CARE_PROVIDER_SITE_OTHER): Payer: Medicaid Other | Admitting: Cardiovascular Disease

## 2017-01-25 VITALS — BP 112/70 | HR 97 | Ht 69.0 in | Wt 271.8 lb

## 2017-01-25 DIAGNOSIS — I10 Essential (primary) hypertension: Secondary | ICD-10-CM

## 2017-01-25 DIAGNOSIS — E782 Mixed hyperlipidemia: Secondary | ICD-10-CM | POA: Diagnosis not present

## 2017-01-25 DIAGNOSIS — Z794 Long term (current) use of insulin: Secondary | ICD-10-CM | POA: Diagnosis not present

## 2017-01-25 DIAGNOSIS — E119 Type 2 diabetes mellitus without complications: Secondary | ICD-10-CM

## 2017-01-25 DIAGNOSIS — I25708 Atherosclerosis of coronary artery bypass graft(s), unspecified, with other forms of angina pectoris: Secondary | ICD-10-CM

## 2017-01-25 MED ORDER — ATORVASTATIN CALCIUM 80 MG PO TABS: 80.0000 mg | ORAL_TABLET | Freq: Every day | ORAL | 4 refills | Status: DC

## 2017-01-25 NOTE — Patient Instructions (Addendum)
Medication Instructions:   No medication changes made  Taklk with Dr. Maryellen PileEason about adding ZETIA 10 mg once a day Goal LDL is <70 (pref 60)  Labwork:  No new labs needed  Testing/Procedures:  No further testing at this time   Follow-Up: It was a pleasure seeing you in the office today. Please call us if you have new issues that need to be addressed before your next appt.  [phone]  Your physician wants you to follow-up in: 12 months.  You will receive a reminder letter in the mail two months in advance. If you don't receive a letter, please call our office to schedule the follow-up appointment.  If you need a refill on your cardiac medications before your next appointment, please call your pharmacy.

## 2017-04-18 LAB — HEMOGLOBIN A1C: Hemoglobin A1C: 6.4

## 2017-04-18 LAB — BASIC METABOLIC PANEL
BUN: 13 (ref 4–21)
CREATININE: 1 (ref 0.6–1.3)
GLUCOSE: 159
POTASSIUM: 3.7 (ref 3.4–5.3)
Sodium: 140 (ref 137–147)

## 2017-04-18 LAB — LIPID PANEL
CHOLESTEROL: 144 (ref 0–200)
HDL: 49 (ref 35–70)
LDL Cholesterol: 75
Triglycerides: 98 (ref 40–160)

## 2017-07-20 ENCOUNTER — Telehealth: Payer: Self-pay | Admitting: Cardiovascular Disease

## 2017-07-20 NOTE — Telephone Encounter (Signed)
°*  STAT* If patient is at the pharmacy, call can be transferred to refill team.   1. Which medications need to be refilled? (please list name of each medication and dose if known) metformin 1000 mg po BID  2. Which pharmacy/location (including street and city if local pharmacy) is medication to be sent to? [facility] [address] [facility]   3. Do they need a 30 day or 90 day supply? 30   Patient pcp retired and new one will not refill until seen in office next week. Patient states Dr. Mariah MillingGollan told him to call if he needed anything and now he does.   Please call if not able to refill.

## 2017-07-20 NOTE — Telephone Encounter (Signed)
Please see note below and advise if Dr. Mariah MillingGollan would be okay refilling patients Metformin.

## 2017-07-20 NOTE — Telephone Encounter (Signed)
Spoke with patient and advised that we are not able to fill his metformin and that he really needs to check with his primary care provider. He verbalized understanding with no further questions at this time.

## 2017-10-26 ENCOUNTER — Ambulatory Visit: Payer: Medicaid Other | Admitting: Family Medicine

## 2017-10-26 ENCOUNTER — Encounter: Payer: Self-pay | Admitting: Family Medicine

## 2017-10-26 VITALS — BP 119/65 | HR 72 | Temp 98.3°F | Resp 16 | Ht 69.0 in | Wt 345.0 lb

## 2017-10-26 DIAGNOSIS — E1159 Type 2 diabetes mellitus with other circulatory complications: Secondary | ICD-10-CM | POA: Diagnosis not present

## 2017-10-26 DIAGNOSIS — E1169 Type 2 diabetes mellitus with other specified complication: Secondary | ICD-10-CM

## 2017-10-26 DIAGNOSIS — M79605 Pain in left leg: Secondary | ICD-10-CM

## 2017-10-26 DIAGNOSIS — I10 Essential (primary) hypertension: Secondary | ICD-10-CM

## 2017-10-26 DIAGNOSIS — R0789 Other chest pain: Secondary | ICD-10-CM

## 2017-10-26 DIAGNOSIS — M47816 Spondylosis without myelopathy or radiculopathy, lumbar region: Secondary | ICD-10-CM | POA: Insufficient documentation

## 2017-10-26 DIAGNOSIS — Z1211 Encounter for screening for malignant neoplasm of colon: Secondary | ICD-10-CM

## 2017-10-26 DIAGNOSIS — Z951 Presence of aortocoronary bypass graft: Secondary | ICD-10-CM

## 2017-10-26 DIAGNOSIS — I2510 Atherosclerotic heart disease of native coronary artery without angina pectoris: Secondary | ICD-10-CM

## 2017-10-26 DIAGNOSIS — Z7689 Persons encountering health services in other specified circumstances: Secondary | ICD-10-CM

## 2017-10-26 DIAGNOSIS — E785 Hyperlipidemia, unspecified: Secondary | ICD-10-CM

## 2017-10-26 DIAGNOSIS — G8929 Other chronic pain: Secondary | ICD-10-CM

## 2017-10-26 DIAGNOSIS — Z6841 Body Mass Index (BMI) 40.0 and over, adult: Secondary | ICD-10-CM | POA: Diagnosis not present

## 2017-10-26 MED ORDER — METOPROLOL SUCCINATE ER 50 MG PO TB24: 50.0000 mg | ORAL_TABLET | Freq: Every day | ORAL | 1 refills | Status: DC

## 2017-10-26 MED ORDER — LISINOPRIL 10 MG PO TABS: 10.0000 mg | ORAL_TABLET | Freq: Every day | ORAL | 1 refills | Status: DC

## 2017-10-26 MED ORDER — METFORMIN HCL 1000 MG PO TABS: 1000.0000 mg | ORAL_TABLET | Freq: Two times a day (BID) | ORAL | 1 refills | Status: DC

## 2017-10-26 MED ORDER — ISOSORBIDE MONONITRATE ER 30 MG PO TB24: 30.0000 mg | ORAL_TABLET | Freq: Every day | ORAL | 1 refills | Status: DC

## 2017-10-26 MED ORDER — SYRINGE (DISPOSABLE) 3 ML MISC: 5 refills | Status: DC

## 2017-10-26 MED ORDER — INSULIN LISPRO PROT & LISPRO (75-25 MIX) 100 UNIT/ML ~~LOC~~ SUSP: 17.0000 [IU] | Freq: Two times a day (BID) | SUBCUTANEOUS | 1 refills | Status: DC

## 2017-10-26 MED ORDER — HYDROCHLOROTHIAZIDE 25 MG PO TABS: 25.0000 mg | ORAL_TABLET | Freq: Every day | ORAL | 1 refills | Status: DC

## 2017-10-26 MED ORDER — BACLOFEN 10 MG PO TABS: 5.0000 mg | ORAL_TABLET | Freq: Two times a day (BID) | ORAL | 1 refills | Status: DC | PRN

## 2017-10-26 MED ORDER — ATORVASTATIN CALCIUM 80 MG PO TABS: 80.0000 mg | ORAL_TABLET | Freq: Every day | ORAL | 1 refills | Status: DC

## 2017-10-26 NOTE — Assessment & Plan Note (Addendum)
Stable s/p CABG x 3, except does have some chronic persistent exertional atypical chest tightness, alrdy on imdur therapy [facility] Last apt 01/2017, will follow-up yearly On med management

## 2017-10-26 NOTE — Assessment & Plan Note (Signed)
BMI >50, multiple co morbid conditions, high risk patient At risk for recurrent complications with known CAD/PAD already By our scales significant weight gain in past 1-2 years, up to gain 30-50 lbs post-op, now he is less active Encourage improving diet and exercise with goals for wt loss reviewed Ultimately will review weight management again in future after further record review, manage diabetes, concern he has been on insulin mix due to cost and may have gained weight - Strongly recommended GLP1 therapy for wt loss as well - review at next visit - Consider refer nutrition vs weight management

## 2017-10-26 NOTE — Assessment & Plan Note (Signed)
Previously well-controlled DM with A1c 6.4 to 6.5, last check 6 mo ago Complications - PAD (s/p CAD, CABG)   Plan:  1. Continue current therapy - refilled Metformin 1000mg  BID, Humalog Mix 75/25 17u BID (switch to vials) and rx syringes sent to pharmacy - Discussion today about advantages of GLP1 injectable medicines instead of the mixed insulin - reviewed wt loss, control A1c, stabilizing, lower risk of CVD outcomes - he is interested, as medicaid patient currently Bydureon Pen is preferred option, we could offer this rx when or if he is ready, handout given 2. Encourage improved lifestyle - low carb, low sugar diet, reduce portion size, continue improving regular exercise 3. Check CBG, bring log to next visit for review 4. Continue ASA, ACEi, Statin 5. DM Foot exam done today / Advised to schedule DM ophtho exam, send record - handout given w/ locations 6. Follow-up 4-6 weeks for dedicated DM visit for A1c and adjust meds review CBG

## 2017-10-26 NOTE — Assessment & Plan Note (Signed)
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

## 2017-10-26 NOTE — Assessment & Plan Note (Addendum)
Well-controlled HTN - Home BP readings not available today, reviewed chart  Complication with PAD (CAD, CABG)    Plan:  1. Continue current BP regimen - refilled Lisinopril 10mg  daily, Metoprolol XL 50mg  daily, HCTZ 25mg  daily, Imdur 30mg  daily 2. Encourage improved lifestyle - low sodium diet, regular exercise 3. Start monitor BP outside office, bring readings to next visit, if persistently >140/90 or new symptoms notify office sooner 4. Follow-up 4-6 weeks, then will monitor at regular intervals and due for labs by 3-6 months

## 2017-10-26 NOTE — Assessment & Plan Note (Signed)
Controlled cholesterol on statin and zetia, with some limited lifestyle Last lipid panel 04/2017 - reviewed from prior PCP Known CAD/PAD   Plan: 1. Continue current meds - Atorvastatin 80mg  daily, Zetia 10mg  daily - refilled 2. Continue ASA 325mg  for secondary ASCVD risk reduction 3. Encourage improved lifestyle - low carb/cholesterol, reduce portion size, continue improving regular exercise 4. Follow-up next lipids after 04/2018

## 2017-10-26 NOTE — Patient Instructions (Addendum)
Thank you for coming to the office today.  Refilled all meds - we will check with pharmacy which syringe will be best for this insulin and phone it in  For your leg pain I would recommend reviewing this with Dr Mariah MillingGollan next time, and request that they check an ABI for lower extremity blood flow as well  If blood flow is normal we may consider imaging of back for nerves  May take Tylenol as needed - can take Tylenol Extra Strength 500mg  tabs - take 1 to 2 tabs per dose (max 1000mg ) every 6-8 hours  Start taking Baclofen (Lioresal) 10mg  (muscle relaxant) - start with half (cut) to one whole pill at night as needed for next 1-3 nights (may make you drowsy, caution with driving) see how it affects you, then if tolerated increase to one pill 2 to 3 times a day or (every 8 hours as needed)  Referral to GI doctor for colonoscopy  [facility] [address] [place], [place] [address] Phone: [phone]  Call insurance find cost and coverage of the following  Instead of Humalog mixed insulin twice daily we could switch you in the future to an alternative option such as the following:  Bydureon Pen (Exenatide ER) - once weekly - this is my preference, very good medicine well tolerated, less side effects of nausea, upset stomach. No dose changes. Preferred by Medicaid. Weight loss and reducing Cardiovascular events   Your provider would like to you have your annual eye exam. Please contact your current eye doctor or here are some good options for you to contact.   [facility]   Address: [address], [place], [place] [address] Phone: [phone]) [phone]  Website: [email or web address]   [facility] [address], [place], [place] [address] Phone: [phone]) [phone] [email or web address]  [facility]  Address: [address], [place], [place] [address] Phone: [phone]) [phone]   [facility] [address], [place]  [address] Phone: [phone]  [facility] Address: [address], [place], [place] [address]  Phone: [phone]   Please schedule a Follow-up Appointment to: Return in about 6 weeks (around 12/07/2017) for DM A1c, med adjust, Left leg pain.  If you have any other questions or concerns, please feel free to call the office or send a message through MyChart. You may also schedule an earlier appointment if necessary.  Additionally, you may be receiving a survey about your experience at our office within a few days to 1 week by e-mail or mail. We value your feedback.  Saralyn PilarAlexander Jamason Peckham, DO [facility] [place] [facility]

## 2017-10-26 NOTE — Assessment & Plan Note (Signed)
Clinically most consistent with previously diagnosed chronic atypical chest tightness related to his post-op CABG Seems exertional only, has been monitored by Cardiology, already on imdur for some symptom relief, does not resolve problem - Continues on med management - Follow-up with Cardiology as planned

## 2017-10-26 NOTE — Progress Notes (Signed)
Subjective:    Patient ID: Craig Harvey, male    DOB: 1956-03-21, 62 y.o.   MRN: [id number]  Craig Harvey is a 62 y.o. male presenting on 10/26/2017 for Establish Care; Diabetes; Hypertension; and Coronary Artery Disease  Established previously with Dr Maryellen Pile as PCP locally for >2 years before he retired and now patient needed new PCP. He was followed previously by Cardiology did not have regular PCP for many years.  HPI   Specialists: Cardiology - Dr Julien Nordmann [facility])  CAD s/p CABG x3 vessel (01/2015) / Chronic Chest Tightness post-op / Hyperlipidemia Reports initial diagnosis in 2016 with symptoms of chest pain tightness and dyspnea on exertion (records available in EHR from 2016 at [facility]), he was transferred to [facility] for further management, after abnormal nuclear stress test and concern for 3 vessel CABG, he was also newly diagnosed Type 2 Diabetic at that time and uncontrolled HTN. He was followed by Cardiology and [facility], received CABG x 3 in 01/2015. - Followed by [facility]. Last seen 01/2017. He has continued chronic complaints of chest tightness and some dyspnea after his CABG surgery, worse with inc exertion and walking, seems unchanged and not worsening, he was given Imdur, and continued on med management with ASA full dose 325mg , ACEi, BB, Statin, and has had Zetia added more recently for goal LDL < 70. Last lipids checked 04/2017 at goal. - He follows up yearly with Cardiology - No other new concerns  CHRONIC DM, Type 2: Last A1c 6.4 (04/2017), previously 6.5. Has been controlled on outside lab results from prior PCP Dr Maryellen Pile. He has been on insulin mix previously 70/30,  Now has been on 75/25 mix twice daily and continued Metformin No CBG logs available at this time Meds: Metformin 1000mg  BID, Humalog KwikPen Mix 75/25 - 17u BID wc (he requests vials of same medicine today) - Never on GLP or Basal Insulin, now he has [employer],  previously he was limited by cost Reports good compliance. Tolerating well w/o side-effects Currently on ACEi Lifestyle: - Diet (trying to improve diet)  Admits rare hypoglycemia 1-2 episodes a month, sometimes minimal, has CBG 90-100 with symptoms, but no significant low hypoglycemia  Chronic Left Lower Extremity / Calf Pain Reports he has chronic intermittent throbbing pain of left lower calf, lasting 5-10 min sporadic episodes, onset was back in 2005 but has persisted since, it seemed to get worse in 2016 after his heart problem and surgery. He will get worse symptoms with over exertion walking and also prolonged sitting in fixed and flexed position. It improves with leg elevation. - Also history of Lumbar OA/DJD with slipped disc on Left side low back History of slip disc in back on left side - History of R side lower extremity vein harvest for CABG, not Left - He used to work in [employer], now no longer doing this work   Health Maintenance:  Colon CA Screening: Never had colonoscopy, previously ordered by last PCP but this was never scheduled and then they left practice. Currently asymptomatic. No known family history of colon CA. Due for screening test new referral to GI for colonoscopy   No prostate cancer in family  Depression screen [facility] 2/9 10/26/2017  Decreased Interest 3  Down, Depressed, Hopeless 1  PHQ - 2 Score 4  Altered sleeping 1  Tired, decreased energy 1  Change in appetite 1  Feeling bad or failure about yourself  1  Trouble concentrating 0  Moving slowly or fidgety/restless 0  Suicidal thoughts 0  PHQ-9 Score 8  Difficult doing work/chores Somewhat difficult    GAD 7 : Generalized Anxiety Score 10/26/2017  Nervous, Anxious, on Edge 0  Control/stop worrying 0  Worry too much - different things 1  Trouble relaxing 0  Restless 0  Easily annoyed or irritable 0  Afraid - awful might happen 1  Total GAD 7 Score 2  Anxiety  Difficulty Not difficult at all    Past Medical History:  Diagnosis Date  . CAD (coronary artery disease)    a. 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 cardiac cath 01/09/2015: ostLAD 70:, mLAD 99%, ost to pLCx 95%, mLCx 80%, LVEF nl, recommend CABGl; c. s/p 3v CABG 01/14/15 LIMA-LAD, SVG-OM, SVG-Ramus  . Hypertension   . IDDM (insulin dependent diabetes mellitus) (HCC)    a. newly diagnosed 01/2015; b. A1C 10.1% on 01/06/2015  . Morbid obesity (HCC)   . OSA (obstructive sleep apnea)    a. does not use cpap   Past Surgical History:  Procedure Laterality Date  . CARDIAC CATHETERIZATION N/A 01/09/2015   Procedure: Left Heart Cath and Coronary Angiography;  Surgeon: Antonieta Iba, MD;  Location: [facility] INVASIVE CV LAB;  Service: Cardiovascular;  Laterality: N/A;  . CORONARY ARTERY BYPASS GRAFT N/A 01/14/2015   Procedure: CORONARY ARTERY BYPASS GRAFTING (CABG), ON PUMP, TIMES THREE, USING LEFT INTERNAL MAMMARY ARTERY, RIGHT GREATER SAPHENOUS VEIN HARVESTED ENDOSCOPICALLY;  Surgeon: Kerin Perna, MD;  Location: [facility] OR;  Service: Open Heart Surgery;  Laterality: N/A;  . HERNIA REPAIR    . TEE WITHOUT CARDIOVERSION N/A 01/14/2015   Procedure: TRANSESOPHAGEAL ECHOCARDIOGRAM (TEE);  Surgeon: Kerin Perna, MD;  Location: [facility] OR;  Service: Open Heart Surgery;  Laterality: N/A;   Social History   Socioeconomic History  . Marital status: Single    Spouse name: Not on file  . Number of children: Not on file  . Years of education: Not on file  . Highest education level: Not on file  Occupational History  . Occupation: Former Naval architect / Production designer, theatre/television/film  Social Needs  . Financial resource strain: Not on file  . Food insecurity:    Worry: Not on file    Inability: Not on file  . Transportation  needs:    Medical: Not on file    Non-medical: Not on file  Tobacco Use  . Smoking status: Former Smoker    Last attempt to quit: 08/06/1975    Years since quitting: 42.2  . Smokeless tobacco: Never Used  Substance and Sexual Activity  . Alcohol use: No  . Drug use: No  . Sexual activity: Not on file  Lifestyle  . Physical activity:    Days per week: Not on file    Minutes per session: Not on file  . Stress: Not on file  Relationships  . Social connections:    Talks on phone: Not on file    Gets together: Not on file    Attends religious service: Not on file    Active member of club or organization: Not on file    Attends meetings of clubs or organizations: Not on file    Relationship status: Not on file  . Intimate partner violence:  Fear of current or ex partner: Not on file    Emotionally abused: Not on file    Physically abused: Not on file    Forced sexual activity: Not on file  Other Topics Concern  . Not on file  Social History Narrative  . Not on file   Family History  Problem Relation Age of Onset  . CAD Mother   . CAD Brother   . Throat cancer Father   . Diabetes Neg Hx   . Cancer Neg Hx   . Prostate cancer Neg Hx   . Colon cancer Neg Hx    Current Outpatient Medications on File Prior to Visit  Medication Sig  . aspirin 325 MG tablet Take 325 mg by mouth daily.   No current facility-administered medications on file prior to visit.     Review of Systems  Constitutional: Negative for activity change, appetite change, chills, diaphoresis, fatigue, fever and unexpected weight change.  HENT: Negative for congestion and hearing loss.   Eyes: Negative for visual disturbance.  Respiratory: Positive for chest tightness (on exertion, chronic) and shortness of breath (on exertion, chronic). Negative for apnea, cough, choking and wheezing.   Cardiovascular: Negative for chest pain, palpitations and leg swelling.  Gastrointestinal: Negative for abdominal pain,  anal bleeding, blood in stool, constipation, diarrhea, nausea and vomiting.  Endocrine: Negative for cold intolerance.  Genitourinary: Negative for decreased urine volume, difficulty urinating, dysuria, frequency, hematuria, testicular pain and urgency.  Musculoskeletal: Negative for arthralgias, back pain (None actively), joint swelling and neck pain.       Left lower leg calf pain intermittent episodes  Skin: Negative for rash.  Allergic/Immunologic: Negative for environmental allergies.  Neurological: Negative for dizziness, weakness, light-headedness, numbness and headaches.  Hematological: Negative for adenopathy.  Psychiatric/Behavioral: Negative for behavioral problems, dysphoric mood and sleep disturbance. The patient is not nervous/anxious.    Per HPI unless specifically indicated above     Objective:    BP 119/65   Pulse 72   Temp 98.3 F (36.8 C) (Oral)   Resp 16   Ht 5\' 9"  (1.753 m)   Wt (!) 345 lb (156.5 kg)   BMI 50.95 kg/m   Wt Readings from Last 3 Encounters:  10/26/17 (!) 345 lb (156.5 kg)  01/25/17 271 lb 12 oz (123.3 kg)  06/16/16 255 lb (115.7 kg)    Physical Exam  Constitutional: He is oriented to person, place, and time. He appears well-developed and well-nourished. No distress.  Well-appearing, comfortable, cooperative, morbidly obese  HENT:  Head: Normocephalic and atraumatic.  Mouth/Throat: Oropharynx is clear and moist.  Eyes: Conjunctivae are normal. Right eye exhibits no discharge. Left eye exhibits no discharge.  Neck: No thyromegaly present.  Cardiovascular: Normal rate, regular rhythm, normal heart sounds and intact distal pulses.  No murmur heard. Pulmonary/Chest: Effort normal and breath sounds normal. No respiratory distress. He has no wheezes. He has no rales.  Musculoskeletal: Normal range of motion. He exhibits no edema.  Left Lower Leg Appears normal. Symemtrical to R. No erythema non tender today. No edema. Distal sensation intact to  light touch. Muscle strength intact 5/5 lower ext. No muscle spasm or hypertonicity.  Lymphadenopathy:    He has no cervical adenopathy.  Neurological: He is alert and oriented to person, place, and time.  Skin: Skin is warm and dry. No rash noted. He is not diaphoretic. No erythema.  Psychiatric: He has a normal mood and affect. His behavior is normal.  Well groomed, good  eye contact, normal speech and thoughts  Nursing note and vitals reviewed.    Diabetic Foot Exam - Simple   Simple Foot Form Diabetic Foot exam was performed with the following findings:  Yes 10/26/2017  3:38 PM  Visual Inspection No deformities, no ulcerations, no other skin breakdown bilaterally:  Yes Sensation Testing Intact to touch and monofilament testing bilaterally:  Yes Pulse Check Posterior Tibialis and Dorsalis pulse intact bilaterally:  Yes Comments    Results for orders placed or performed in visit on 10/26/17  Basic metabolic panel  Result Value Ref Range   Glucose 110    BUN 18 4 - 21   Creatinine 1.1 0.6 - 1.3   Potassium 4.3 3.4 - 5.3   Sodium 139 137 - 147  Lipid panel  Result Value Ref Range   Triglycerides 87 40 - 160   Cholesterol 151 0 - 200   HDL 43 35 - 70   LDL Cholesterol 91   Hemoglobin A1c  Result Value Ref Range   Hemoglobin A1C 6.5   Calcium  Result Value Ref Range   Calcium 9.5    Carbon Dioxide, Total 18 (A) 20 - 29  Basic metabolic panel  Result Value Ref Range   Glucose 159    BUN 13 4 - 21   Creatinine 1.0 0.6 - 1.3   Potassium 3.7 3.4 - 5.3   Sodium 140 137 - 147  Lipid panel  Result Value Ref Range   Triglycerides 98 40 - 160   Cholesterol 144 0 - 200   HDL 49 35 - 70   LDL Cholesterol 75   Hemoglobin A1c  Result Value Ref Range   Hemoglobin A1C 6.4       Assessment & Plan:   Problem List Items Addressed This Visit    Atypical chest pain    Clinically most consistent with previously diagnosed chronic atypical chest tightness related to his post-op  CABG Seems exertional only, has been monitored by Cardiology, already on imdur for some symptom relief, does not resolve problem - Continues on med management - Follow-up with Cardiology as planned      Relevant Medications   isosorbide mononitrate (IMDUR) 30 MG 24 hr tablet   CAD, multiple vessel - Primary   Relevant Medications   metoprolol succinate (TOPROL-XL) 50 MG 24 hr tablet   lisinopril (PRINIVIL,ZESTRIL) 10 MG tablet   isosorbide mononitrate (IMDUR) 30 MG 24 hr tablet   hydrochlorothiazide (HYDRODIURIL) 25 MG tablet   atorvastatin (LIPITOR) 80 MG tablet   Chronic pain of left lower extremity    Uncertain exact etiology, asymptomatic now, exam is unremarkable Given chronicity seems to be related to muscle vs nerve, seems less likely arterial or PAD related, does not seem classic for claudication, given often it is positional, not always exertional, has mixed episodic flare ups, improve w/ elevation - Could be related to morbid obesity worse w/ weight gain, considered lumbar DJD for nerve impingement - At risk for PAD, but no findings to support this on exam, which is reassuring, distal pulses intact  Plan Reassurance Treat conservatively, tylenol PRN, heat/ice, elevation compression PRN, Baclofen rx muscle relaxant caution sedation PRN Follow-up with Dr Mariah Milling as planned 01/2018 - may request ABI testing to rule vascular component out - Next in future we can consider back imaging vs LE [place] if need      Relevant Medications   baclofen (LIORESAL) 10 MG tablet   Hyperlipidemia associated with type 2 diabetes mellitus (  HCC)    Controlled cholesterol on statin and zetia, with some limited lifestyle Last lipid panel 04/2017 - reviewed from prior PCP Known CAD/PAD   Plan: 1. Continue current meds - Atorvastatin 80mg  daily, Zetia 10mg  daily - refilled 2. Continue ASA 325mg  for secondary ASCVD risk reduction 3. Encourage improved lifestyle - low carb/cholesterol, reduce portion  size, continue improving regular exercise 4. Follow-up next lipids after 04/2018      Relevant Medications   metoprolol succinate (TOPROL-XL) 50 MG 24 hr tablet   lisinopril (PRINIVIL,ZESTRIL) 10 MG tablet   isosorbide mononitrate (IMDUR) 30 MG 24 hr tablet   hydrochlorothiazide (HYDRODIURIL) 25 MG tablet   atorvastatin (LIPITOR) 80 MG tablet   metFORMIN (GLUCOPHAGE) 1000 MG tablet   insulin lispro protamine-lispro (HUMALOG MIX 75/25) (75-25) 100 UNIT/ML SUSP injection   Hypertension    Well-controlled HTN - Home BP readings not available today, reviewed chart  Complication with PAD (CAD, CABG)    Plan:  1. Continue current BP regimen - refilled Lisinopril 10mg  daily, Metoprolol XL 50mg  daily, HCTZ 25mg  daily, Imdur 30mg  daily 2. Encourage improved lifestyle - low sodium diet, regular exercise 3. Start monitor BP outside office, bring readings to next visit, if persistently >140/90 or new symptoms notify office sooner 4. Follow-up 4-6 weeks, then will monitor at regular intervals and due for labs by 3-6 months      Relevant Medications   metoprolol succinate (TOPROL-XL) 50 MG 24 hr tablet   lisinopril (PRINIVIL,ZESTRIL) 10 MG tablet   isosorbide mononitrate (IMDUR) 30 MG 24 hr tablet   hydrochlorothiazide (HYDRODIURIL) 25 MG tablet   atorvastatin (LIPITOR) 80 MG tablet   Morbid obesity with BMI of 50.0-59.9, adult (HCC)    BMI >50, multiple co morbid conditions, high risk patient At risk for recurrent complications with known CAD/PAD already By our scales significant weight gain in past 1-2 years, up to gain 30-50 lbs post-op, now he is less active Encourage improving diet and exercise with goals for wt loss reviewed Ultimately will review weight management again in future after further record review, manage diabetes, concern he has been on insulin mix due to cost and may have gained weight - Strongly recommended GLP1 therapy for wt loss as well - review at next visit - Consider  refer nutrition vs weight management      Relevant Medications   metFORMIN (GLUCOPHAGE) 1000 MG tablet   insulin lispro protamine-lispro (HUMALOG MIX 75/25) (75-25) 100 UNIT/ML SUSP injection   S/P CABG x 3    Stable s/p CABG x 3, except does have some chronic persistent exertional atypical chest tightness, alrdy on imdur therapy [facility] Last apt 01/2017, will follow-up yearly On med management      Spondylosis of lumbar region without myelopathy or radiculopathy    Stable currently without flare, history reported by patient of slip disc in lumbar DJD No available back x-ray or images Likely factor for some chronic L low leg pain at times, could have nerve impingement or other related symptoms Currently stable Will offer Baclofen PRN for muscle relaxant, continue Tylenol, avoid NSAID      Relevant Medications   baclofen (LIORESAL) 10 MG tablet   Type 2 diabetes mellitus with vascular disease (HCC)    Previously well-controlled DM with A1c 6.4 to 6.5, last check 6 mo ago Complications - PAD (s/p CAD, CABG)   Plan:  1. Continue current therapy - refilled Metformin 1000mg  BID, Humalog Mix 75/25 17u BID (switch to vials)  and rx syringes sent to pharmacy - Discussion today about advantages of GLP1 injectable medicines instead of the mixed insulin - reviewed wt loss, control A1c, stabilizing, lower risk of CVD outcomes - he is interested, as medicaid patient currently Bydureon Pen is preferred option, we could offer this rx when or if he is ready, handout given 2. Encourage improved lifestyle - low carb, low sugar diet, reduce portion size, continue improving regular exercise 3. Check CBG, bring log to next visit for review 4. Continue ASA, ACEi, Statin 5. DM Foot exam done today / Advised to schedule DM ophtho exam, send record - handout given w/ locations 6. Follow-up 4-6 weeks for dedicated DM visit for A1c and adjust meds review CBG      Relevant Medications    metoprolol succinate (TOPROL-XL) 50 MG 24 hr tablet   lisinopril (PRINIVIL,ZESTRIL) 10 MG tablet   isosorbide mononitrate (IMDUR) 30 MG 24 hr tablet   hydrochlorothiazide (HYDRODIURIL) 25 MG tablet   atorvastatin (LIPITOR) 80 MG tablet   metFORMIN (GLUCOPHAGE) 1000 MG tablet   insulin lispro protamine-lispro (HUMALOG MIX 75/25) (75-25) 100 UNIT/ML SUSP injection   Syringe, Disposable, 3 ML MISC    Other Visit Diagnoses    Encounter to establish care with new doctor       Requested outside record from prior PCP Dr Maryellen Pile, will review labs and records available to me   Screen for colon cancer       Ordered colonoscopy ref to [facility] today   Relevant Orders   Ambulatory referral to Gastroenterology      Meds ordered this encounter  Medications  . metoprolol succinate (TOPROL-XL) 50 MG 24 hr tablet    Sig: Take 1 tablet (50 mg total) by mouth daily. Take with or immediately following a meal.    Dispense:  90 tablet    Refill:  1  . lisinopril (PRINIVIL,ZESTRIL) 10 MG tablet    Sig: Take 1 tablet (10 mg total) by mouth daily.    Dispense:  90 tablet    Refill:  1  . isosorbide mononitrate (IMDUR) 30 MG 24 hr tablet    Sig: Take 1 tablet (30 mg total) by mouth daily.    Dispense:  90 tablet    Refill:  1  . hydrochlorothiazide (HYDRODIURIL) 25 MG tablet    Sig: Take 1 tablet (25 mg total) by mouth daily. for blood pressure    Dispense:  90 tablet    Refill:  1  . atorvastatin (LIPITOR) 80 MG tablet    Sig: Take 1 tablet (80 mg total) by mouth daily.    Dispense:  90 tablet    Refill:  1  . metFORMIN (GLUCOPHAGE) 1000 MG tablet    Sig: Take 1 tablet (1,000 mg total) by mouth 2 (two) times daily with a meal.    Dispense:  180 tablet    Refill:  1  . insulin lispro protamine-lispro (HUMALOG MIX 75/25) (75-25) 100 UNIT/ML SUSP injection    Sig: Inject 17 Units into the skin 2 (two) times daily with a meal.    Dispense:  30 mL    Refill:  1    Switched from Humalog KwikPen 75/25 to  Vials  . baclofen (LIORESAL) 10 MG tablet    Sig: Take 0.5-1 tablets (5-10 mg total) by mouth 2 (two) times daily as needed for muscle spasms (leg pain).    Dispense:  30 each    Refill:  1  . Syringe, Disposable, 3  ML MISC    Sig: Use to inject humalog mix 75/25 17u BID    Dispense:  200 each    Refill:  5    Please dispense appropriate syringe size and gauge for patient    Follow up plan: Return in about 6 weeks (around 12/07/2017) for DM A1c, med adjust, Left leg pain.  Saralyn Pilar, DO [facility] 10/26/2017, 5:42 PM

## 2017-10-26 NOTE — Assessment & Plan Note (Signed)
Uncertain exact etiology, asymptomatic now, exam is unremarkable Given chronicity seems to be related to muscle vs nerve, seems less likely arterial or PAD related, does not seem classic for claudication, given often it is positional, not always exertional, has mixed episodic flare ups, improve w/ elevation - Could be related to morbid obesity worse w/ weight gain, considered lumbar DJD for nerve impingement - At risk for PAD, but no findings to support this on exam, which is reassuring, distal pulses intact  Plan Reassurance Treat conservatively, tylenol PRN, heat/ice, elevation compression PRN, Baclofen rx muscle relaxant caution sedation PRN Follow-up with Dr Mariah MillingGollan as planned 01/2018 - may request ABI testing to rule vascular component out - Next in future we can consider back imaging vs LE [place] if need

## 2017-11-17 ENCOUNTER — Telehealth: Payer: Self-pay | Admitting: Gastroenterology

## 2017-11-17 NOTE — Telephone Encounter (Signed)
Patient needs appt for colonoscopy

## 2017-11-18 ENCOUNTER — Other Ambulatory Visit: Payer: Self-pay

## 2017-11-18 ENCOUNTER — Telehealth: Payer: Self-pay | Admitting: *Deleted

## 2017-11-18 DIAGNOSIS — Z1211 Encounter for screening for malignant neoplasm of colon: Secondary | ICD-10-CM

## 2017-11-18 MED ORDER — CLOPIDOGREL BISULFATE 75 MG PO TABS: 75.0000 mg | ORAL_TABLET | Freq: Every day | ORAL | 3 refills | Status: DC

## 2017-11-18 MED ORDER — PEG 3350-KCL-NABCB-NACL-NASULF 236 G PO SOLR: 4000.0000 mL | Freq: Once | ORAL | 0 refills | Status: AC

## 2017-11-18 MED ORDER — ASPIRIN EC 81 MG PO TBEC: 81.0000 mg | DELAYED_RELEASE_TABLET | Freq: Every day | ORAL | 3 refills | Status: AC

## 2017-11-18 NOTE — Telephone Encounter (Signed)
Patient verbalized understanding to decrease aspirin to 81 mg daily and to stop it 5 days prior to procedure. He would like to take Plavix as well. His colonoscopy is coming up on 12/05/17 so he will elect to start the plavix after that.  He also inquired if he could take something like Viagra as needed. Advised I will seek advice from Dr Mariah Milling. Patient is also scheduled to see Dr Mariah Milling on 12/15/17 for 1 year f/u but would like to know about Viagra beforehand.

## 2017-11-18 NOTE — Telephone Encounter (Signed)
Editor: Antonieta Iba, MD (Physician)       Show:Clear all Manual[] Template[] Copied  Added by: Antonieta Iba, MD   Hover for details   preop requests from GI still coming to wrong folder  CV Triage,  He can decrease asa down to 81 mg daily, especially 5 days before procedure He does not need to be taking asa 325 daily If he desires very aggressive antiplatelet regimen, he could take asa 81 with plavix or asa 81 with brilinta

## 2017-11-18 NOTE — Telephone Encounter (Signed)
-----   Message from Antonieta Iba, MD sent at 11/18/2017 10:07 AM EDT -----   ----- Message ----- From: Avie Arenas, CMA Sent: 11/18/2017   9:57 AM To: Antonieta Iba, MD

## 2017-11-18 NOTE — Progress Notes (Signed)
preop requests from GI still coming to wrong folder  CV Triage,  He can decrease asa down to 81 mg daily, especially 5 days before procedure He does not need to be taking asa 325 daily If he desires very aggressive antiplatelet regimen, he could take asa 81 with plavix or asa 81 with brilinta

## 2017-11-20 NOTE — Telephone Encounter (Signed)
Okay to restart Plavix after the procedure with low-dose aspirin It is aggressive, higher risk of bleeding on dual antiplatelet  some selective take only aspirin Would defer to him  He is on long-acting nitrates and these cannot be mixed with Viagra

## 2017-11-21 ENCOUNTER — Other Ambulatory Visit: Payer: Self-pay

## 2017-11-21 DIAGNOSIS — E785 Hyperlipidemia, unspecified: Principal | ICD-10-CM

## 2017-11-21 DIAGNOSIS — E1169 Type 2 diabetes mellitus with other specified complication: Secondary | ICD-10-CM

## 2017-11-21 MED ORDER — EZETIMIBE 10 MG PO TABS: 10.0000 mg | ORAL_TABLET | Freq: Every day | ORAL | 3 refills | Status: DC

## 2017-11-21 NOTE — Telephone Encounter (Signed)
Refilled  Saralyn Pilar, DO [facility] 11/21/2017, 5:22 PM

## 2017-11-21 NOTE — Telephone Encounter (Signed)
Spoke with patient and reviewed Dr. Windell Hummingbird recommendations. He wanted to know if there was another medication. Advised that he should check with his PCP regarding other medications for erectile dysfunction and that he is on isosorbide mononitrate which is contraindicated. He verbalized understanding of our conversation, agreement with plan, and had no further questions at this time.

## 2017-11-21 NOTE — Telephone Encounter (Signed)
Patient needs refill on ezetimibe (Zetia)  once daily.  He would like 90 day supply to [facility] [address] [place].

## 2017-11-24 ENCOUNTER — Telehealth: Payer: Self-pay

## 2017-11-24 NOTE — Telephone Encounter (Signed)
Patient has requested colonoscopy date change to 01/30/18 due to his brother is having brain surgery. Will inform Endo dept to move patients colonoscopy date to 01/30/18.  Patient has been advised the following per Dr. Ethelene Hal reply to blood thinner request:    " He can decrease asa down to  daily, especially 5 days before procedure  He does not need to be taking asa  daily  If he desires very aggressive antiplatelet regimen, he could take asa 81 mg with plavix or  with brilinta"  Patient read back instructions as stated above and has been advised if he has questions regarding this to contact Dr. Windell Hummingbird office.

## 2017-11-25 NOTE — Telephone Encounter (Signed)
Patient returning call about asa .  Please call back .

## 2017-11-25 NOTE — Telephone Encounter (Signed)
S/w patient. He wanted to clarify how much Aspirin he should be taking. Advised patient he should take Aspirin 81 mg and plavix 75 mg once a day. He verbalized understanding of this.

## 2017-12-07 ENCOUNTER — Ambulatory Visit: Payer: Medicaid Other | Admitting: Family Medicine

## 2017-12-07 ENCOUNTER — Other Ambulatory Visit: Payer: Self-pay | Admitting: Family Medicine

## 2017-12-07 ENCOUNTER — Encounter: Payer: Self-pay | Admitting: Family Medicine

## 2017-12-07 VITALS — BP 127/76 | HR 93 | Temp 98.5°F | Resp 16 | Ht 69.0 in | Wt 345.0 lb

## 2017-12-07 DIAGNOSIS — I2510 Atherosclerotic heart disease of native coronary artery without angina pectoris: Secondary | ICD-10-CM

## 2017-12-07 DIAGNOSIS — M79605 Pain in left leg: Secondary | ICD-10-CM | POA: Diagnosis not present

## 2017-12-07 DIAGNOSIS — G4733 Obstructive sleep apnea (adult) (pediatric): Secondary | ICD-10-CM

## 2017-12-07 DIAGNOSIS — Z1159 Encounter for screening for other viral diseases: Secondary | ICD-10-CM

## 2017-12-07 DIAGNOSIS — Z114 Encounter for screening for human immunodeficiency virus [HIV]: Secondary | ICD-10-CM

## 2017-12-07 DIAGNOSIS — G8929 Other chronic pain: Secondary | ICD-10-CM

## 2017-12-07 DIAGNOSIS — E1169 Type 2 diabetes mellitus with other specified complication: Secondary | ICD-10-CM

## 2017-12-07 DIAGNOSIS — E1151 Type 2 diabetes mellitus with diabetic peripheral angiopathy without gangrene: Secondary | ICD-10-CM | POA: Diagnosis not present

## 2017-12-07 DIAGNOSIS — Z6841 Body Mass Index (BMI) 40.0 and over, adult: Secondary | ICD-10-CM

## 2017-12-07 DIAGNOSIS — Z0000 Encounter for general adult medical examination without abnormal findings: Secondary | ICD-10-CM

## 2017-12-07 DIAGNOSIS — E785 Hyperlipidemia, unspecified: Secondary | ICD-10-CM

## 2017-12-07 DIAGNOSIS — Z125 Encounter for screening for malignant neoplasm of prostate: Secondary | ICD-10-CM

## 2017-12-07 DIAGNOSIS — E1159 Type 2 diabetes mellitus with other circulatory complications: Secondary | ICD-10-CM

## 2017-12-07 DIAGNOSIS — I10 Essential (primary) hypertension: Secondary | ICD-10-CM

## 2017-12-07 LAB — POCT GLYCOSYLATED HEMOGLOBIN (HGB A1C): HEMOGLOBIN A1C: 7.2 % — AB (ref 4.0–5.6)

## 2017-12-07 MED ORDER — BACLOFEN 10 MG PO TABS: 5.0000 mg | ORAL_TABLET | Freq: Two times a day (BID) | ORAL | 5 refills | Status: DC | PRN

## 2017-12-07 MED ORDER — INSULIN LISPRO PROT & LISPRO (75-25 MIX) 100 UNIT/ML ~~LOC~~ SUSP
17.0000 [IU] | Freq: Two times a day (BID) | SUBCUTANEOUS | 11 refills | Status: DC
Start: 2017-12-07 — End: 2018-10-25

## 2017-12-07 NOTE — Assessment & Plan Note (Signed)
Improved pain now on baclofen PRN Seems only positional with prolong sit now, especially car ride / travel, improve with stretching and activity ambulation Less likely to be claudication due to PAD, but he is at risk and has evidence of vascular disease, likely related to diabetes, obesity, HTN  Plan Reassurance today Refill Baclofen Continue conservative care, avoid prolong flex position if possible Home remedy for muscle cramps  Proceed with cardiology follow-up may benefit from ABI in near future to further characterize if any lower extremity PAD

## 2017-12-07 NOTE — Progress Notes (Signed)
Subjective:    Patient ID: Craig Harvey, male    DOB: 03-30-1956, 62 y.o.   MRN: [id number]  Craig Harvey is a 62 y.o. male presenting on 12/07/2017 for Diabetes   HPI   CHRONIC DM, Type 2: Last A1c 6.4 (04/2017), previously 6.5. Elevated A1c today. CBG avg approx 120 range, lowest 91 since last visit Meds: Metformin 1000mg  BID, Humalog Vials 75/25 - 17u BID wc -  - Never on GLP or Basal Insulin, now he has [employer], previously he was limited by cost Reports good compliance. Tolerating well w/o side-effects Currently on ACEi Lifestyle: - Diet (trying to improve diet)  Admits rare hypoglycemia 1-2 episodes a month, sometimes minimal, has CBG 90-100 with symptoms, but no significant low hypoglycemia Denies numbness tingling  Chronic Left Lower Extremity / Calf Pain - Last note with background information, onset of problem since 2005, seemed to worsen after 2016 with heart surgery, seemed to get worse with overexertion but primarily worse with prolonged flexed position - Update today, he states currently without pain. Since last visit 10/26/17 he was started on Baclofen 10mg  PRN with some improvement when he has the left leg pain, seems positional and provoked with knee flexion and bent leg while in car, soon onset pain will usually hurt for duration, but then has significant relief when able to stand up and stretch and start walking pain will resolve - He request refill of Baclofen  CAD / Vascular Disease - Previously on ASA 81 daily, recently contacted Cardiology upon our request, he asked about DAPT, and he was initiated or restarted on Plavix 75mg  daily in addition, this was to be after colonoscopy but that was re-scheduled, now he is on both daily - He is also on imdur daily long acting nitrate, which is contraindicated with viagra, he is not concerned about ED today   Health Maintenance:  He is scheduled for upcoming Colonoscopy rescheduled to 01/2018  Depression  screen [facility] 2/9 12/07/2017 10/26/2017  Decreased Interest 0 3  Down, Depressed, Hopeless 0 1  PHQ - 2 Score 0 4  Altered sleeping 0 1  Tired, decreased energy 0 1  Change in appetite 0 1  Feeling bad or failure about yourself  0 1  Trouble concentrating 0 0  Moving slowly or fidgety/restless 0 0  Suicidal thoughts 0 0  PHQ-9 Score 0 8  Difficult doing work/chores Not difficult at all Somewhat difficult    Social History   Tobacco Use  . Smoking status: Former Smoker    Last attempt to quit: 08/06/1975    Years since quitting: 42.3  . Smokeless tobacco: Never Used  Substance Use Topics  . Alcohol use: No  . Drug use: No    Review of Systems Per HPI unless specifically indicated above     Objective:    BP 127/76   Pulse 93   Temp 98.5 F (36.9 C) (Oral)   Resp 16   Ht 5\' 9"  (1.753 m)   Wt (!) 345 lb (156.5 kg)   BMI 50.95 kg/m   Wt Readings from Last 3 Encounters:  12/07/17 (!) 345 lb (156.5 kg)  10/26/17 (!) 345 lb (156.5 kg)  01/25/17 271 lb 12 oz (123.3 kg)    Physical Exam  Constitutional: He is oriented to person, place, and time. He appears well-developed and well-nourished. No distress.  Well-appearing, comfortable, cooperative, morbidly obese  HENT:  Head: Normocephalic and atraumatic.  Mouth/Throat: Oropharynx is clear and moist.  Eyes: Conjunctivae are normal. Right eye exhibits no discharge. Left eye exhibits no discharge.  Neck: Normal range of motion. Neck supple. No thyromegaly present.  Cardiovascular: Normal rate, regular rhythm, normal heart sounds and intact distal pulses.  No murmur heard. Pulmonary/Chest: Effort normal and breath sounds normal. No respiratory distress. He has no wheezes. He has no rales.  Musculoskeletal: Normal range of motion. He exhibits no edema.  Left Lower Leg Appears normal. Symemtrical to R. No erythema non tender today. No edema. Distal sensation intact to light touch. Muscle strength intact 5/5 lower ext. No muscle  spasm or hypertonicity.  Lymphadenopathy:    He has no cervical adenopathy.  Neurological: He is alert and oriented to person, place, and time.  Skin: Skin is warm and dry. No rash noted. He is not diaphoretic. No erythema.  Psychiatric: He has a normal mood and affect. His behavior is normal.  Well groomed, good eye contact, normal speech and thoughts  Nursing note and vitals reviewed.    Recent Labs    12/20/16 04/18/17 12/07/17 1404  HGBA1C 6.5 6.4 7.2*    Results for orders placed or performed in visit on 12/07/17  POCT HgB A1C  Result Value Ref Range   Hemoglobin A1C 7.2 (A) 4.0 - 5.6 %   HbA1c, POC (prediabetic range)  5.7 - 6.4 %   HbA1c, POC (controlled diabetic range)  0.0 - 7.0 %      Assessment & Plan:   Problem List Items Addressed This Visit    CAD, multiple vessel    Followed by [facility] On ASA + Plavix 75mg  daily now Also on ACEi, Statin, imdur Proceed with cardiology follow-up may benefit from ABI in near future for lower extremity pain, however history and exam here support more MSK etiology due to knee / leg position more than actual claudication symptoms      Chronic pain of left lower extremity    Improved pain now on baclofen PRN Seems only positional with prolong sit now, especially car ride / travel, improve with stretching and activity ambulation Less likely to be claudication due to PAD, but he is at risk and has evidence of vascular disease, likely related to diabetes, obesity, HTN  Plan Reassurance today Refill Baclofen Continue conservative care, avoid prolong flex position if possible Home remedy for muscle cramps  Proceed with cardiology follow-up may benefit from ABI in near future to further characterize if any lower extremity PAD      Relevant Medications   baclofen (LIORESAL) 10 MG tablet   DM (diabetes mellitus), type 2 with peripheral vascular complications (HCC) - Primary    Mild elevated A1c up to 7.2 from prior 6.4,  still within range of controlled T2DM Complications - vascular disease including CAD s/p CABG, PAD, among other including hyperlipidemia, morbid obesity, and OSA - increases risk of future cardiovascular complications and poor glucose control  Plan:  1. Continue current therapy - refilled Humalog vials 75/25 mix 17u BID - may adjust minor dose +/- 1-2 unit as needed - Continue Metformin 1000mg  BID - Again reviewed briefly GLP1 medications, review benefits wt loss, A1c control, reduce cardiovascular events, and will consider these in future especially if not improving or worse 2. Encourage improved lifestyle - low carb, low sugar diet, reduce portion size, continue improving regular exercise 3. Check CBG, bring log to next visit for review 4. Continue ASA, ACEi, Statin 5. Advised to schedule DM ophtho exam, send record - Dr Clydene Pugh 6. Follow-up  4 months Annual Physical + labs A1c      Relevant Medications   insulin lispro protamine-lispro (HUMALOG MIX 75/25) (75-25) 100 UNIT/ML SUSP injection   Other Relevant Orders   POCT HgB A1C (Completed)      Meds ordered this encounter  Medications  . insulin lispro protamine-lispro (HUMALOG MIX 75/25) (75-25) 100 UNIT/ML SUSP injection    Sig: Inject 17 Units into the skin 2 (two) times daily with a meal.    Dispense:  10 mL    Refill:  11    Switched from Humalog KwikPen 75/25 to Vials  . baclofen (LIORESAL) 10 MG tablet    Sig: Take 0.5-1 tablets (5-10 mg total) by mouth 2 (two) times daily as needed for muscle spasms (leg pain).    Dispense:  30 each    Refill:  5    Follow up plan: Return in about 4 months (around 04/08/2018) for Annual Physical.  Future labs ordered for 04/12/18 - routine screen add Hep C and HIV  Saralyn PilarAlexander Jager Koska, DO [facility] 12/07/2017, 5:01 PM

## 2017-12-07 NOTE — Assessment & Plan Note (Addendum)
[facility] On ASA + Plavix 75mg  daily now Also on ACEi, Statin, imdur Proceed with cardiology follow-up may benefit from ABI in near future for lower extremity pain, however history and exam here support more MSK etiology due to knee / leg position more than actual claudication symptoms

## 2017-12-07 NOTE — Patient Instructions (Addendum)
Thank you for coming to the office today.  A1c 7.2 today - slightly elevated from 6.4  Refilled Baclofen - take as needed  Refilled Insulin  Let me know if need larger quantity or other change to rx  Leg cramps - Try spoonful of yellow mustard to relieve leg cramps or try daily to prevent the problem  - OTC natural option is Hyland's Leg Cramps (Dissolving tablet) take as needed for muscle cramps  When ready - schedule with Dr Clydene PughWoodard - have them send us a Diabetic Eye Exam Report  DUE for FASTING BLOOD WORK (no food or drink after midnight before the lab appointment, only water or coffee without cream/sugar on the morning of)  SCHEDULE "Lab Only" visit in the morning at the clinic for lab draw in  4 MONTHS   - Make sure Lab Only appointment is at about 1 week before your next appointment, so that results will be available  For Lab Results, once available within 2-3 days of blood draw, you can can log in to MyChart online to view your results and a brief explanation. Also, we can discuss results at next follow-up visit.   Please schedule a Follow-up Appointment to: Return in about 4 months (around 04/08/2018) for Annual Physical.  If you have any other questions or concerns, please feel free to call the office or send a message through MyChart. You may also schedule an earlier appointment if necessary.  Additionally, you may be receiving a survey about your experience at our office within a few days to 1 week by e-mail or mail. We value your feedback.  Saralyn PilarAlexander Osceola Holian, DO [facility]

## 2017-12-07 NOTE — Assessment & Plan Note (Signed)
Mild elevated A1c up to 7.2 from prior 6.4, still within range of controlled T2DM Complications - vascular disease including CAD s/p CABG, PAD, among other including hyperlipidemia, morbid obesity, and OSA - increases risk of future cardiovascular complications and poor glucose control  Plan:  1. Continue current therapy - refilled Humalog vials 75/25 mix 17u BID - may adjust minor dose +/- 1-2 unit as needed - Continue Metformin 1000mg  BID - Again reviewed briefly GLP1 medications, review benefits wt loss, A1c control, reduce cardiovascular events, and will consider these in future especially if not improving or worse 2. Encourage improved lifestyle - low carb, low sugar diet, reduce portion size, continue improving regular exercise 3. Check CBG, bring log to next visit for review 4. Continue ASA, ACEi, Statin 5. Advised to schedule DM ophtho exam, send record - Dr Clydene PughWoodard 6. Follow-up 4 months Annual Physical + labs A1c

## 2017-12-15 ENCOUNTER — Ambulatory Visit: Payer: Self-pay | Admitting: Cardiovascular Disease

## 2018-01-10 ENCOUNTER — Telehealth: Payer: Self-pay | Admitting: Gastroenterology

## 2018-01-10 NOTE — Telephone Encounter (Signed)
Pt is calling to reschedule his procedure he has problems finding someone to accompany him

## 2018-01-11 ENCOUNTER — Telehealth: Payer: Self-pay | Admitting: Nurse Practitioner

## 2018-01-11 NOTE — Telephone Encounter (Signed)
error 

## 2018-01-11 NOTE — Telephone Encounter (Signed)
Contacted patient to see if he wanted to go ahead and reschedule his colonoscopy.  He said he will call back.  I also informed him that if he needed someone to accompany him to his procedure and drive him home he may want to check with some of the homehealth agencies in the community.  Thanks [employer]

## 2018-01-22 DIAGNOSIS — I2510 Atherosclerotic heart disease of native coronary artery without angina pectoris: Secondary | ICD-10-CM | POA: Insufficient documentation

## 2018-01-22 NOTE — Progress Notes (Signed)
Cardiology Office Note  Date:  01/25/2018   ID:  Craig Harvey, DOB 1956-03-13, MRN [id number]  PCP:  Smitty Cords, DO   Chief Complaint  Patient presents with  . other    1 year follow up. Meds reviewed by the pt.'s bottles. Pt. c/o left leg pain.     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 Has some workout equipment at home but has not been using it No regular exercise program  Denies any pain in his chest on exertion Has cramping in his left leg after sitting for prolonged periods of time Getting some chronic low back pain  Unable to lose any weight  Lab work reviewed with him HBA1C 6.4 Most recent LDL 75  EKG personally reviewed by myself on todays visit Shows normal sinus rhythm rate 93 bpm first degree AV block no significant ST or T-wave changes  Other past medical history presented to [facility] on [facility]2015 with complaints of nausea, feeling hot, and chest pain with radiation into his left shoulder. EKG was unremarkable in ED and Cardiac enzymes were negative.  Echo on 7/4 showed EF 60-65%, no regional wall motion abnormalities, no valvular abnormalities, PASP normal, normal study.  He underwent nuclear stress test that showed large defect of moderate severity in the mid anterior, mid anteroseptal, mid anterolateral, apical anterior, apical lateral and apex locations. F   underwent cardiac catheterization which showed severe 2 vessel CAD (ost LAD 70%, mid LAD 99%, ost LCx to prox LCx 95% after the take off of OM1, mid LCx 80% prior to takeoff to  OM2, normal LV function).It was felt CABG would be indicated and the patient was transferred to [facility] for further care.   He underwent successful 3 vessel CABG (LIMA-LAD, SVG-OM, SVG-Ramus) on 01/14/2015. Post-op he developed Afib and converted on IV amiodarone. His A1C was poorly controlled coming in at 10.0%.    PMH:   has a past medical history of CAD (coronary artery disease), Hypertension, Morbid obesity (HCC), and OSA (obstructive sleep apnea).  PSH:    Past Surgical History:  Procedure Laterality Date  . CARDIAC CATHETERIZATION N/A 01/09/2015   Procedure: Left Heart Cath and Coronary Angiography;  Surgeon: Antonieta Iba, MD;  Location: [facility] INVASIVE CV LAB;  Service: Cardiovascular;  Laterality: N/A;  . CORONARY ARTERY BYPASS GRAFT N/A 01/14/2015   Procedure: CORONARY ARTERY BYPASS GRAFTING (CABG), ON PUMP, TIMES THREE, USING LEFT INTERNAL MAMMARY ARTERY, RIGHT GREATER SAPHENOUS VEIN HARVESTED ENDOSCOPICALLY;  Surgeon: Kerin Perna, MD;  Location: [facility] OR;  Service: Open Heart Surgery;  Laterality: N/A;  . HERNIA REPAIR    . TEE WITHOUT CARDIOVERSION N/A 01/14/2015   Procedure: TRANSESOPHAGEAL ECHOCARDIOGRAM (TEE);  Surgeon: Kerin Perna, MD;  Location: [facility] OR;  Service: Open Heart Surgery;  Laterality: N/A;    Current Outpatient Medications  Medication Sig Dispense Refill  . aspirin EC 81 MG tablet Take 1 tablet (81 mg total) by mouth daily. 90 tablet 3  . atorvastatin (LIPITOR) 80 MG tablet  Take 1 tablet (80 mg total) by mouth daily. 90 tablet 1  . baclofen (LIORESAL) 10 MG tablet Take 0.5-1 tablets (5-10 mg total) by mouth 2 (two) times daily as needed for muscle spasms (leg pain). 30 each 5  . BD VEO INSULIN SYRINGE U/F 31G X 15/64" 0.3 ML MISC   5  . clopidogrel (PLAVIX) 75 MG tablet Take 1 tablet (75 mg total) by mouth daily. 90 tablet 3  . ezetimibe (ZETIA) 10 MG tablet Take 1 tablet (10 mg total) by mouth daily. 90 tablet 3  . hydrochlorothiazide (HYDRODIURIL)  25 MG tablet Take 1 tablet (25 mg total) by mouth daily. for blood pressure 90 tablet 1  . insulin lispro protamine-lispro (HUMALOG MIX 75/25) (75-25) 100 UNIT/ML SUSP injection Inject 17 Units into the skin 2 (two) times daily with a meal. 10 mL 11  . isosorbide mononitrate (IMDUR) 30 MG 24 hr tablet Take 1 tablet (30 mg total) by mouth daily. 90 tablet 1  . lisinopril (PRINIVIL,ZESTRIL) 10 MG tablet Take 1 tablet (10 mg total) by mouth daily. 90 tablet 1  . metFORMIN (GLUCOPHAGE) 1000 MG tablet Take 1 tablet (1,000 mg total) by mouth 2 (two) times daily with a meal. 180 tablet 1  . metoprolol succinate (TOPROL-XL) 50 MG 24 hr tablet Take 1 tablet (50 mg total) by mouth daily. Take with or immediately following a meal. 90 tablet 1  . Syringe, Disposable, 3 ML MISC Use to inject humalog mix 75/25 17u BID 200 each 5   No current facility-administered medications for this visit.      Allergies:   Patient has no known allergies.   Social History:  The patient  reports that he quit smoking about 42 years ago. He has never used smokeless tobacco. He reports that he does not drink alcohol or use drugs.   Family History:   family history includes CAD in his brother and mother; Throat cancer in his father.    Review of Systems: Review of Systems  Constitutional: Negative.   Respiratory: Negative.   Cardiovascular: Negative.   Gastrointestinal: Negative.   Musculoskeletal: Positive for back pain and myalgias.  Neurological: Negative.   Psychiatric/Behavioral: Negative.   All other systems reviewed and are negative.    PHYSICAL EXAM: VS:  BP 140/80 (BP Location: Left Arm, Patient Position: Sitting, Cuff Size: Large)   Pulse 93   Ht 5\' 7"  (1.702 m)   Wt (!) 343 lb 8 oz (155.8 kg)   BMI 53.80 kg/m  , BMI Body mass index is 53.8 kg/m.  Constitutional:  oriented to person, place, and time. No distress.  HENT:  Head: Normocephalic and atraumatic.  Eyes:  no discharge. No scleral icterus.   Neck: Normal range of motion. Neck supple. No JVD present.  Cardiovascular: Normal rate, regular rhythm, normal heart sounds and intact distal pulses. Exam reveals no gallop and no friction rub. No edema No murmur heard. Pulmonary/Chest: Effort normal and breath sounds normal. No stridor. No respiratory distress.  no wheezes.  no rales.  no tenderness.  Abdominal: Soft.  no distension.  no tenderness.  Musculoskeletal: Normal range of motion.  no  tenderness or deformity.  Neurological:  normal muscle tone. Coordination normal. No atrophy Skin: Skin is warm and dry. No rash noted. not diaphoretic.  Psychiatric:  normal mood and affect. behavior is normal. Thought content normal.    Recent Labs: 04/18/2017: BUN 13; Creatinine 1.0; Potassium 3.7; Sodium 140    Lipid Panel Lab Results  Component Value Date   CHOL 144 04/18/2017   HDL 49 04/18/2017   LDLCALC 75 04/18/2017   TRIG 98 04/18/2017      Wt Readings from Last 3 Encounters:  01/25/18 (!) 343 lb 8 oz (155.8 kg)  12/07/17 (!) 345 lb (156.5 kg)  10/26/17 (!) 345 lb (156.5 kg)       ASSESSMENT AND PLAN:  Coronary artery disease of bypass graft of native heart with stable angina pectoris (HCC) - Plan: EKG 12-Lead Currently with no symptoms of angina. No further workup at this time. Continue current medication regimen. He is having some atypical muscle pain when lifting heavy items Recommended he start a exercise program with light weights  Mixed hyperlipidemia - Plan: EKG 12-Lead Recommended he stay on Lipitor and Zetia Recommend weight loss Goal LDL less than 70  Essential hypertension - Plan: EKG 12-Lead High end of his range Recommended weight loss, close monitoring of blood pressure at home Metoprolol could be increased if pressure continues to run high  Type 2 diabetes mellitus without complication, with long-term current use of insulin (HCC) - Plan: EKG 12-Lead Most recent hemoglobin A 6.4 Recommended low  carbohydrate diet  Morbid obesity (HCC) - Plan: EKG 12-Lead  dietary guide Previously provided We have encouraged continued exercise, careful diet management in an effort to lose weight. Previously provided continues to struggle  Disposition:   F/U  12 months   Total encounter time more than 25 minutes  Greater than 50% was spent in counseling and coordination of care with the patient   Orders Placed This Encounter  Procedures  . EKG 12-Lead     Signed, Dossie Arbourim Tywanna Seifer, M.D., Ph.D. 01/25/2018  [facility], [place] [phone]

## 2018-01-25 ENCOUNTER — Ambulatory Visit (INDEPENDENT_AMBULATORY_CARE_PROVIDER_SITE_OTHER): Payer: Medicaid Other | Admitting: Cardiovascular Disease

## 2018-01-25 ENCOUNTER — Encounter: Payer: Self-pay | Admitting: Cardiovascular Disease

## 2018-01-25 VITALS — BP 140/80 | HR 93 | Ht 67.0 in | Wt 343.5 lb

## 2018-01-25 DIAGNOSIS — Z794 Long term (current) use of insulin: Secondary | ICD-10-CM

## 2018-01-25 DIAGNOSIS — I25118 Atherosclerotic heart disease of native coronary artery with other forms of angina pectoris: Secondary | ICD-10-CM | POA: Diagnosis not present

## 2018-01-25 DIAGNOSIS — E1151 Type 2 diabetes mellitus with diabetic peripheral angiopathy without gangrene: Secondary | ICD-10-CM | POA: Diagnosis not present

## 2018-01-25 DIAGNOSIS — E1169 Type 2 diabetes mellitus with other specified complication: Secondary | ICD-10-CM

## 2018-01-25 DIAGNOSIS — E785 Hyperlipidemia, unspecified: Secondary | ICD-10-CM | POA: Diagnosis not present

## 2018-01-25 DIAGNOSIS — E119 Type 2 diabetes mellitus without complications: Secondary | ICD-10-CM | POA: Diagnosis not present

## 2018-01-25 DIAGNOSIS — Z951 Presence of aortocoronary bypass graft: Secondary | ICD-10-CM

## 2018-01-25 DIAGNOSIS — I10 Essential (primary) hypertension: Secondary | ICD-10-CM | POA: Diagnosis not present

## 2018-01-25 MED ORDER — CLOPIDOGREL BISULFATE 75 MG PO TABS: 75.0000 mg | ORAL_TABLET | Freq: Every day | ORAL | 3 refills | Status: DC

## 2018-01-25 NOTE — Patient Instructions (Signed)

## 2018-01-30 ENCOUNTER — Encounter: Admission: RE | Payer: Self-pay | Source: Ambulatory Visit

## 2018-01-30 ENCOUNTER — Ambulatory Visit: Admission: RE | Admit: 2018-01-30 | Payer: Medicaid Other | Source: Ambulatory Visit | Admitting: Gastroenterology

## 2018-01-30 SURGERY — COLONOSCOPY WITH PROPOFOL
Anesthesia: General

## 2018-01-31 ENCOUNTER — Telehealth: Payer: Self-pay | Admitting: Family Medicine

## 2018-01-31 NOTE — Telephone Encounter (Signed)
Pt called wanted to know if Novolog  70/30 was cheaper than what he was taken if so he would like to try this. Pt call back # is [phone]

## 2018-01-31 NOTE — Telephone Encounter (Signed)
He may check with his [facility] or his Insurance to ask what the cost and coverage of this other insulin Novolog 70/30 would be.  I do not know the cost, both should be similar. There are often preferences based on insurance.  If he would like to switch, he may call us back once he confirms price.  Saralyn PilarAlexander Karamalegos, DO [facility] 01/31/2018, 6:26 PM

## 2018-02-01 NOTE — Telephone Encounter (Signed)
Patient advised as per Dr K. 

## 2018-03-14 ENCOUNTER — Other Ambulatory Visit: Payer: Self-pay | Admitting: *Deleted

## 2018-03-14 ENCOUNTER — Telehealth: Payer: Self-pay | Admitting: Cardiovascular Disease

## 2018-03-14 MED ORDER — CLOPIDOGREL BISULFATE 75 MG PO TABS: 75.0000 mg | ORAL_TABLET | Freq: Every day | ORAL | 3 refills | Status: DC

## 2018-03-14 NOTE — Telephone Encounter (Signed)
Requested Prescriptions   Signed Prescriptions Disp Refills  . clopidogrel (PLAVIX) 75 MG tablet 90 tablet 3    Sig: Take 1 tablet (75 mg total) by mouth daily.    Authorizing Provider: GOLLAN, TIMOTHY J    Ordering User: LOPEZ, MARINA C    

## 2018-03-14 NOTE — Telephone Encounter (Signed)
°*  STAT* If patient is at the pharmacy, call can be transferred to refill team.   1. Which medications need to be refilled? (please list name of each medication and dose if known)  Clopidogrel (PLAVIX) 75 MG - 1 tablet daily  2. Which pharmacy/location (including street and city if local pharmacy) is medication to be sent to? [facility] on [address]  3. Do they need a 30 day or 90 day supply? 90 day   Patient states he was to get a 90 day amount at last appointment on 7/24 but the pharmacy never received approval

## 2018-04-12 ENCOUNTER — Other Ambulatory Visit: Payer: Medicaid Other

## 2018-04-13 DIAGNOSIS — H5213 Myopia, bilateral: Secondary | ICD-10-CM | POA: Diagnosis not present

## 2018-04-13 LAB — HM DIABETES EYE EXAM

## 2018-04-14 DIAGNOSIS — H5213 Myopia, bilateral: Secondary | ICD-10-CM | POA: Diagnosis not present

## 2018-04-19 ENCOUNTER — Encounter: Payer: Medicaid Other | Admitting: Family Medicine

## 2018-04-21 ENCOUNTER — Encounter: Payer: Self-pay | Admitting: Family Medicine

## 2018-04-21 DIAGNOSIS — E1151 Type 2 diabetes mellitus with diabetic peripheral angiopathy without gangrene: Secondary | ICD-10-CM

## 2018-05-02 ENCOUNTER — Other Ambulatory Visit: Payer: Self-pay | Admitting: Family Medicine

## 2018-05-02 DIAGNOSIS — E1159 Type 2 diabetes mellitus with other circulatory complications: Secondary | ICD-10-CM

## 2018-05-03 ENCOUNTER — Telehealth: Payer: Self-pay | Admitting: Family Medicine

## 2018-05-03 NOTE — Telephone Encounter (Signed)
Pt. Called requesting refill on metformin called into [facility] [address]

## 2018-05-04 NOTE — Telephone Encounter (Signed)
Rx  Send. 

## 2018-05-05 DIAGNOSIS — H5213 Myopia, bilateral: Secondary | ICD-10-CM | POA: Diagnosis not present

## 2018-05-18 ENCOUNTER — Other Ambulatory Visit: Payer: Medicaid Other

## 2018-05-18 ENCOUNTER — Other Ambulatory Visit: Payer: Self-pay | Admitting: Family Medicine

## 2018-05-18 ENCOUNTER — Telehealth: Payer: Self-pay | Admitting: Family Medicine

## 2018-05-18 DIAGNOSIS — Z1159 Encounter for screening for other viral diseases: Secondary | ICD-10-CM

## 2018-05-18 DIAGNOSIS — Z125 Encounter for screening for malignant neoplasm of prostate: Secondary | ICD-10-CM | POA: Diagnosis not present

## 2018-05-18 DIAGNOSIS — I10 Essential (primary) hypertension: Secondary | ICD-10-CM

## 2018-05-18 DIAGNOSIS — Z0000 Encounter for general adult medical examination without abnormal findings: Secondary | ICD-10-CM

## 2018-05-18 DIAGNOSIS — E1159 Type 2 diabetes mellitus with other circulatory complications: Secondary | ICD-10-CM | POA: Diagnosis not present

## 2018-05-18 DIAGNOSIS — Z114 Encounter for screening for human immunodeficiency virus [HIV]: Secondary | ICD-10-CM | POA: Diagnosis not present

## 2018-05-18 DIAGNOSIS — I2510 Atherosclerotic heart disease of native coronary artery without angina pectoris: Secondary | ICD-10-CM

## 2018-05-18 DIAGNOSIS — G4733 Obstructive sleep apnea (adult) (pediatric): Secondary | ICD-10-CM | POA: Diagnosis not present

## 2018-05-18 DIAGNOSIS — E1169 Type 2 diabetes mellitus with other specified complication: Secondary | ICD-10-CM | POA: Diagnosis not present

## 2018-05-18 DIAGNOSIS — E785 Hyperlipidemia, unspecified: Secondary | ICD-10-CM

## 2018-05-18 DIAGNOSIS — Z6841 Body Mass Index (BMI) 40.0 and over, adult: Secondary | ICD-10-CM | POA: Diagnosis not present

## 2018-05-18 DIAGNOSIS — R0789 Other chest pain: Secondary | ICD-10-CM

## 2018-05-18 NOTE — Telephone Encounter (Signed)
Pt called requesting refill on metoprolol, metormin,lisinopril,isosorbide mononitrare,hydrodiuril.  [facility] [address] [facility]  [address]

## 2018-05-19 LAB — COMPLETE METABOLIC PANEL WITH GFR
AG Ratio: 1.4 (calc) (ref 1.0–2.5)
ALBUMIN MSPROF: 4.3 g/dL (ref 3.6–5.1)
ALKALINE PHOSPHATASE (APISO): 53 U/L (ref 40–115)
ALT: 45 U/L (ref 9–46)
AST: 31 U/L (ref 10–35)
BUN: 18 mg/dL (ref 7–25)
CALCIUM: 9.9 mg/dL (ref 8.6–10.3)
CO2: 28 mmol/L (ref 20–32)
CREATININE: 0.96 mg/dL (ref 0.70–1.33)
Chloride: 99 mmol/L (ref 98–110)
GFR, Est African American: 100 mL/min/{1.73_m2} (ref >=60)
GFR, Est Non African American: 86 mL/min/{1.73_m2} (ref >=60)
GLOBULIN: 3 g/dL (ref 1.9–3.7)
GLUCOSE: 104 mg/dL — AB (ref 65–99)
Potassium: 4.2 mmol/L (ref 3.5–5.3)
Sodium: 137 mmol/L (ref 135–146)
Total Bilirubin: 0.5 mg/dL (ref 0.2–1.2)
Total Protein: 7.3 g/dL (ref 6.1–8.1)

## 2018-05-19 LAB — LIPID PANEL
Cholesterol: 120 mg/dL (ref <200)
HDL: 41 mg/dL (ref >40)
LDL CHOLESTEROL (CALC): 65 mg/dL
Non-HDL Cholesterol (Calc): 79 mg/dL (calc) (ref <130)
Total CHOL/HDL Ratio: 2.9 (calc) (ref <5.0)
Triglycerides: 63 mg/dL (ref <150)

## 2018-05-19 LAB — HEPATITIS C ANTIBODY
Hepatitis C Ab: NONREACTIVE
SIGNAL TO CUT-OFF: 0.02 (ref <1.00)

## 2018-05-19 LAB — CBC WITH DIFFERENTIAL/PLATELET
BASOS PCT: 0.9 %
Basophils Absolute: 39 cells/uL (ref 0–200)
Eosinophils Absolute: 151 cells/uL (ref 15–500)
Eosinophils Relative: 3.5 %
HEMATOCRIT: 40.5 % (ref 38.5–50.0)
Hemoglobin: 13.1 g/dL — ABNORMAL LOW (ref 13.2–17.1)
LYMPHS ABS: 1208 {cells}/uL (ref 850–3900)
MCH: 27.2 pg (ref 27.0–33.0)
MCHC: 32.3 g/dL (ref 32.0–36.0)
MCV: 84 fL (ref 80.0–100.0)
MPV: 10.5 fL (ref 7.5–12.5)
Monocytes Relative: 9.3 %
NEUTROS PCT: 58.2 %
Neutro Abs: 2503 cells/uL (ref 1500–7800)
Platelets: 167 10*3/uL (ref 140–400)
RBC: 4.82 10*6/uL (ref 4.20–5.80)
RDW: 14.6 % (ref 11.0–15.0)
Total Lymphocyte: 28.1 %
WBC: 4.3 10*3/uL (ref 3.8–10.8)
WBCMIX: 400 {cells}/uL (ref 200–950)

## 2018-05-19 LAB — HEMOGLOBIN A1C
EAG (MMOL/L): 8.1 (calc)
HEMOGLOBIN A1C: 6.7 %{Hb} — AB (ref <5.7)
MEAN PLASMA GLUCOSE: 146 (calc)

## 2018-05-19 LAB — PSA, TOTAL WITH REFLEX TO PSA, FREE: PSA, TOTAL: 0.4 ng/mL (ref <=4.0)

## 2018-05-19 LAB — TSH: TSH: 2.25 m[IU]/L (ref 0.40–4.50)

## 2018-05-19 LAB — HIV ANTIBODY (ROUTINE TESTING W REFLEX): HIV 1&2 Ab, 4th Generation: NONREACTIVE

## 2018-05-19 NOTE — Telephone Encounter (Signed)
Rx send

## 2018-05-25 ENCOUNTER — Encounter: Payer: Self-pay | Admitting: Family Medicine

## 2018-05-25 ENCOUNTER — Ambulatory Visit (INDEPENDENT_AMBULATORY_CARE_PROVIDER_SITE_OTHER): Payer: Medicaid Other | Admitting: Family Medicine

## 2018-05-25 VITALS — BP 141/82 | HR 85 | Temp 98.4°F | Resp 16 | Ht 67.0 in | Wt 331.0 lb

## 2018-05-25 DIAGNOSIS — M47816 Spondylosis without myelopathy or radiculopathy, lumbar region: Secondary | ICD-10-CM | POA: Diagnosis not present

## 2018-05-25 DIAGNOSIS — I25118 Atherosclerotic heart disease of native coronary artery with other forms of angina pectoris: Secondary | ICD-10-CM | POA: Diagnosis not present

## 2018-05-25 DIAGNOSIS — Z6841 Body Mass Index (BMI) 40.0 and over, adult: Secondary | ICD-10-CM | POA: Diagnosis not present

## 2018-05-25 DIAGNOSIS — E1169 Type 2 diabetes mellitus with other specified complication: Secondary | ICD-10-CM

## 2018-05-25 DIAGNOSIS — Z23 Encounter for immunization: Secondary | ICD-10-CM | POA: Diagnosis not present

## 2018-05-25 DIAGNOSIS — E785 Hyperlipidemia, unspecified: Secondary | ICD-10-CM

## 2018-05-25 DIAGNOSIS — M79605 Pain in left leg: Secondary | ICD-10-CM | POA: Diagnosis not present

## 2018-05-25 DIAGNOSIS — G8929 Other chronic pain: Secondary | ICD-10-CM

## 2018-05-25 DIAGNOSIS — E1151 Type 2 diabetes mellitus with diabetic peripheral angiopathy without gangrene: Secondary | ICD-10-CM | POA: Diagnosis not present

## 2018-05-25 DIAGNOSIS — Z0000 Encounter for general adult medical examination without abnormal findings: Secondary | ICD-10-CM

## 2018-05-25 NOTE — Assessment & Plan Note (Signed)
Controlled cholesterol on statin and zetia, with some limited lifestyle Last lipid panel 05/2018 Known CAD/PAD   Plan: 1. Continue current meds - Atorvastatin 80mg  daily, Zetia 10mg  daily 2. Continue ASA 81 and Plavix 75 DAPT for secondary ASCVD risk reduction 3. Encourage improved lifestyle - low carb/cholesterol, reduce portion size, continue improving regular exercise 4. Follow-up yearly lipids

## 2018-05-25 NOTE — Progress Notes (Signed)
Subjective:    Patient ID: Craig Harvey, male    DOB: 1956/04/02, 62 y.o.   MRN: [id number]  Craig Harvey is a 62 y.o. male presenting on 05/25/2018 for Annual Exam   HPI  Here for Annual Physical and Lab Review.  CHRONIC DM, Type 2: Last A1c 6.7  Meds:Metformin 1000mg  BID, Humalog Vials 75/25 - 17u BID wc -  - Never on GLP or Basal Insulin Reports good compliance. Tolerating well w/o side-effects Currently on ACEi Lifestyle: - Diet (trying to improve diet) Admits rare hypoglycemia 1-2 episodes a month, sometimes minimal, has CBG 90-100 with symptoms, but no significant low hypoglycemia  HYPERLIPIDEMIA / Morbid Obesity BMI >51 - Reports no concerns. Last lipid panel 05/2018, controlled  - Currently taking Atorvastatin 80mg , Zetia 10mg , tolerating well without side effects or myalgias - Wt down 10 lbs  Chronic Left Lower Extremity / Calf Pain - Last visit with me 12/2017, for initial visit for same problem however documentation shows present since 2005, treated with muscle relaxant for PRN relief, see prior notes for background information. - Today patient reports persistent problem. He feels a "pulsing" in his L lower leg at times with pain episodes. Asking about circulation. - Still seems positional and provoked with knee flexion and bent leg while in car, soon onset pain will usually hurt for duration, but then has significant relief when able to stand up and stretch and start walking pain will resolve - Taking baclofen PRN  CAD / Vascular Disease / S/p CABG Followed by [facility] Dr Mariah Milling. history of CABG 01/2015 He continues on DAPT with Plavix 75 and ASA 81 He remains chest pain free. Continues on BB, Imdur, Statin, Zetia  Additional request letter for [employer] - due to chronic back pain, leg pain, and heart disease he is unable to sit prolonged on jury.  Health Maintenance: Due for colonoscopy, he was referred previously to [facility] - but he was unable to  schedule this, now he plans to contact them back to re-schedule in 2020  Depression screen [facility] 2/9 05/25/2018 12/07/2017 10/26/2017  Decreased Interest 0 0 3  Down, Depressed, Hopeless 0 0 1  PHQ - 2 Score 0 0 4  Altered sleeping - 0 1  Tired, decreased energy - 0 1  Change in appetite - 0 1  Feeling bad or failure about yourself  - 0 1  Trouble concentrating - 0 0  Moving slowly or fidgety/restless - 0 0  Suicidal thoughts - 0 0  PHQ-9 Score - 0 8  Difficult doing work/chores - Not difficult at all Somewhat difficult    Past Medical History:  Diagnosis Date  . CAD (coronary artery disease)    a. 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 cardiac cath 01/09/2015: ostLAD 70:, mLAD 99%, ost to pLCx 95%, mLCx 80%, LVEF nl, recommend CABGl; c. s/p 3v CABG 01/14/15 LIMA-LAD, SVG-OM, SVG-Ramus  . Hypertension   . Morbid obesity (HCC)   . OSA (obstructive sleep apnea)    a. does not use cpap   Past Surgical History:  Procedure Laterality Date  . CARDIAC CATHETERIZATION N/A 01/09/2015   Procedure: Left Heart Cath and Coronary Angiography;  Surgeon: Antonieta Iba, MD;  Location: [facility] INVASIVE CV LAB;  Service: Cardiovascular;  Laterality: N/A;  . CORONARY  ARTERY BYPASS GRAFT N/A 01/14/2015   Procedure: CORONARY ARTERY BYPASS GRAFTING (CABG), ON PUMP, TIMES THREE, USING LEFT INTERNAL MAMMARY ARTERY, RIGHT GREATER SAPHENOUS VEIN HARVESTED ENDOSCOPICALLY;  Surgeon: Kerin Perna, MD;  Location: [facility] OR;  Service: Open Heart Surgery;  Laterality: N/A;  . HERNIA REPAIR    . TEE WITHOUT CARDIOVERSION N/A 01/14/2015   Procedure: TRANSESOPHAGEAL ECHOCARDIOGRAM (TEE);  Surgeon: Kerin Perna, MD;  Location: [facility] OR;  Service: Open Heart Surgery;  Laterality: N/A;   Social History   Socioeconomic History  .  Marital status: Single    Spouse name: Not on file  . Number of children: Not on file  . Years of education: Not on file  . Highest education level: Not on file  Occupational History  . Occupation: Former Naval architect / Production designer, theatre/television/film  Social Needs  . Financial resource strain: Not on file  . Food insecurity:    Worry: Not on file    Inability: Not on file  . Transportation needs:    Medical: Not on file    Non-medical: Not on file  Tobacco Use  . Smoking status: Former Smoker    Last attempt to quit: 08/06/1975    Years since quitting: 42.8  . Smokeless tobacco: Never Used  Substance and Sexual Activity  . Alcohol use: No  . Drug use: No  . Sexual activity: Not on file  Lifestyle  . Physical activity:    Days per week: Not on file    Minutes per session: Not on file  . Stress: Not on file  Relationships  . Social connections:    Talks on phone: Not on file    Gets together: Not on file    Attends religious service: Not on file    Active member of club or organization: Not on file    Attends meetings of clubs or organizations: Not on file    Relationship status: Not on file  . Intimate partner violence:    Fear of current or ex partner: Not on file    Emotionally abused: Not on file    Physically abused: Not on file    Forced sexual activity: Not on file  Other Topics Concern  . Not on file  Social History Narrative  . Not on file   Family History  Problem Relation Age of Onset  . CAD Mother   . CAD Brother   . Throat cancer Father   . Diabetes Neg Hx   . Cancer Neg Hx   . Prostate cancer Neg Hx   . Colon cancer Neg Hx    Current Outpatient Medications on File Prior to Visit  Medication Sig  . aspirin EC 81 MG tablet Take 1 tablet (81 mg total) by mouth daily.  Marland Kitchen atorvastatin (LIPITOR) 80 MG tablet Take 1 tablet (80 mg total) by mouth daily.  . baclofen (LIORESAL) 10 MG tablet Take 0.5-1 tablets (5-10 mg total) by mouth 2 (two) times daily as needed for  muscle spasms (leg pain).  . BD VEO INSULIN SYRINGE U/F 31G X 15/64" 0.3 ML MISC   . clopidogrel (PLAVIX) 75 MG tablet Take 1 tablet (75 mg total) by mouth daily.  Marland Kitchen ezetimibe (ZETIA) 10 MG tablet Take 1 tablet (10 mg total) by mouth daily.  . hydrochlorothiazide (HYDRODIURIL) 25 MG tablet TAKE 1 TABLET BY MOUTH ONCE DAILY FOR BLOOD PRESSURE  . insulin lispro protamine-lispro (HUMALOG MIX 75/25) (75-25) 100 UNIT/ML SUSP injection Inject 17 Units into the  skin 2 (two) times daily with a meal.  . isosorbide mononitrate (IMDUR) 30 MG 24 hr tablet TAKE 1 TABLET BY MOUTH ONCE DAILY  . lisinopril (PRINIVIL,ZESTRIL) 10 MG tablet TAKE 1 TABLET BY MOUTH ONCE DAILY  . metFORMIN (GLUCOPHAGE) 1000 MG tablet TAKE 1 TABLET BY MOUTH TWICE DAILY WITH A MEAL  . metoprolol succinate (TOPROL-XL) 50 MG 24 hr tablet TAKE 1 TABLET BY MOUTH ONCE DAILY WITH  OR  IMMEDIATELY  FOLLOWING  A  MEAL  . Syringe, Disposable, 3 ML MISC Use to inject humalog mix 75/25 17u BID   No current facility-administered medications on file prior to visit.     Review of Systems  Constitutional: Negative for activity change, appetite change, chills, diaphoresis, fatigue and fever.  HENT: Negative for congestion and hearing loss.   Eyes: Negative for visual disturbance.  Respiratory: Negative for apnea, cough, choking, chest tightness, shortness of breath and wheezing.   Cardiovascular: Negative for chest pain, palpitations and leg swelling.  Gastrointestinal: Negative for abdominal pain, anal bleeding, blood in stool, constipation, diarrhea, nausea and vomiting.  Endocrine: Negative for cold intolerance.  Genitourinary: Negative for decreased urine volume, difficulty urinating, dysuria, frequency, hematuria and urgency.  Musculoskeletal: Negative for arthralgias, back pain and neck pain.  Skin: Negative for rash.  Allergic/Immunologic: Negative for environmental allergies.  Neurological: Negative for dizziness, weakness,  light-headedness, numbness and headaches.  Hematological: Negative for adenopathy.  Psychiatric/Behavioral: Negative for behavioral problems, dysphoric mood and sleep disturbance. The patient is not nervous/anxious.    Per HPI unless specifically indicated above      Objective:    BP (!) 141/82   Pulse 85   Temp 98.4 F (36.9 C) (Oral)   Resp 16   Ht 5\' 7"  (1.702 m)   Wt (!) 331 lb (150.1 kg)   BMI 51.84 kg/m   Wt Readings from Last 3 Encounters:  05/25/18 (!) 331 lb (150.1 kg)  01/25/18 (!) 343 lb 8 oz (155.8 kg)  12/07/17 (!) 345 lb (156.5 kg)    Physical Exam  Constitutional: He is oriented to person, place, and time. He appears well-developed and well-nourished. No distress.  Well-appearing, comfortable, cooperative, morbidly obese  HENT:  Head: Normocephalic and atraumatic.  Mouth/Throat: Oropharynx is clear and moist.  Eyes: Pupils are equal, round, and reactive to light. Conjunctivae and EOM are normal. Right eye exhibits no discharge. Left eye exhibits no discharge.  Neck: Normal range of motion. Neck supple. No thyromegaly present.  Cardiovascular: Normal rate, regular rhythm, normal heart sounds and intact distal pulses.  No murmur heard. Pulmonary/Chest: Effort normal and breath sounds normal. No respiratory distress. He has no wheezes. He has no rales.  Abdominal: Soft. Bowel sounds are normal. He exhibits no distension and no mass. There is no tenderness.  Musculoskeletal: Normal range of motion. He exhibits no edema or tenderness.  Left Lower Leg Appears normal. Symemtrical to R. No erythema non tender today. No edema. Distal sensation intact to light touch. Muscle strength intact 5/5 lower ext. No muscle spasm or hypertonicity.  Lymphadenopathy:    He has no cervical adenopathy.  Neurological: He is alert and oriented to person, place, and time.  Distal sensation intact to light touch all extremities  Skin: Skin is warm and dry. No rash noted. He is not  diaphoretic. No erythema.  Psychiatric: He has a normal mood and affect. His behavior is normal.  Well groomed, good eye contact, normal speech and thoughts  Nursing note and vitals reviewed.  Results for orders placed or performed in visit on 05/18/18  TSH  Result Value Ref Range   TSH 2.25 0.40 - 4.50 mIU/L  HIV antibody  Result Value Ref Range   HIV 1&2 Ab, 4th Generation NON-REACTIVE NON-REACTI  Hepatitis C antibody  Result Value Ref Range   Hepatitis C Ab NON-REACTIVE NON-REACTI   SIGNAL TO CUT-OFF 0.02 <1.00  PSA, Total with Reflex to PSA, Free  Result Value Ref Range   PSA, Total 0.4 < OR = 4.0 ng/mL  Lipid panel  Result Value Ref Range   Cholesterol 120 <200 mg/dL   HDL 41 >16 mg/dL   Triglycerides 63 <109 mg/dL   LDL Cholesterol (Calc) 65 mg/dL (calc)   Total CHOL/HDL Ratio 2.9 <5.0 (calc)   Non-HDL Cholesterol (Calc) 79 <604 mg/dL (calc)  COMPLETE METABOLIC PANEL WITH GFR  Result Value Ref Range   Glucose, Bld 104 (H) 65 - 99 mg/dL   BUN 18 7 - 25 mg/dL   Creat 5.40 9.81 - 1.91 mg/dL   GFR, Est Non African American 86 > OR = 60 mL/min/1.60m2   GFR, Est African American 100 > OR = 60 mL/min/1.33m2   BUN/Creatinine Ratio NOT APPLICABLE 6 - 22 (calc)   Sodium 137 135 - 146 mmol/L   Potassium 4.2 3.5 - 5.3 mmol/L   Chloride 99 98 - 110 mmol/L   CO2 28 20 - 32 mmol/L   Calcium 9.9 8.6 - 10.3 mg/dL   Total Protein 7.3 6.1 - 8.1 g/dL   Albumin 4.3 3.6 - 5.1 g/dL   Globulin 3.0 1.9 - 3.7 g/dL (calc)   AG Ratio 1.4 1.0 - 2.5 (calc)   Total Bilirubin 0.5 0.2 - 1.2 mg/dL   Alkaline phosphatase (APISO) 53 40 - 115 U/L   AST 31 10 - 35 U/L   ALT 45 9 - 46 U/L  CBC with Differential/Platelet  Result Value Ref Range   WBC 4.3 3.8 - 10.8 Thousand/uL   RBC 4.82 4.20 - 5.80 Million/uL   Hemoglobin 13.1 (L) 13.2 - 17.1 g/dL   HCT 47.8 29.5 - 62.1 %   MCV 84.0 80.0 - 100.0 fL   MCH 27.2 27.0 - 33.0 pg   MCHC 32.3 32.0 - 36.0 g/dL   RDW 30.8 65.7 - 84.6 %   Platelets  167 140 - 400 Thousand/uL   MPV 10.5 7.5 - 12.5 fL   Neutro Abs 2,503 1,500 - 7,800 cells/uL   Lymphs Abs 1,208 850 - 3,900 cells/uL   WBC mixed population 400 200 - 950 cells/uL   Eosinophils Absolute 151 15 - 500 cells/uL   Basophils Absolute 39 0 - 200 cells/uL   Neutrophils Relative % 58.2 %   Total Lymphocyte 28.1 %   Monocytes Relative 9.3 %   Eosinophils Relative 3.5 %   Basophils Relative 0.9 %  Hemoglobin A1c  Result Value Ref Range   Hgb A1c MFr Bld 6.7 (H) <5.7 % of total Hgb   Mean Plasma Glucose 146 (calc)   eAG (mmol/L) 8.1 (calc)      Assessment & Plan:   Problem List Items Addressed This Visit    CAD (coronary artery disease), native coronary artery    Asymptomatic without active angina, controlled on long acting nitrate S/p CABG 2016 3 v Followed by [facility] On med management now  Order PAD testing w/ ABI at Cardiology - given his episodes of chronic LLE pain      Relevant Orders   VAS  US LOWER EXTREMITY ARTERIAL DUPLEX   Chronic pain of left lower extremity    Persistent chronic problem Uncertain etiology, see prior note Risk factors of PAD Symptoms not entirely consistent of claudication  Plan Order ABI testing bilateral LE at cardiology - f/u results      Relevant Orders   VAS US LOWER EXTREMITY ARTERIAL DUPLEX   DM (diabetes mellitus), type 2 with peripheral vascular complications (HCC)    Improved DM control to A1c 6.7 No episodes of hypoglycemia. Improved hyperglycemia Complications - vascular disease including CAD s/p CABG, PAD, among other including hyperlipidemia, morbid obesity, and OSA - increases risk of future cardiovascular complications and poor glucose control  Plan:  1. Continue current therapy - refilled Humalog vials 75/25 mix 17u BID - may adjust minor dose +/- 1-2 unit as needed - Continue Metformin 1000mg  BID - Again reviewed briefly GLP1 medications, review benefits wt loss, A1c control, reduce cardiovascular events,  and will consider these in future especially if not improving or worse - he is hesitant on cost 2. Encourage improved lifestyle - low carb, low sugar diet, reduce portion size, continue improving regular exercise 3. Check CBG, bring log to next visit for review 4. Continue ASA, ACEi, Statin 5. UTD DM Ophtho 04/2018 6. Follow-up 6 months A1c      Hyperlipidemia associated with type 2 diabetes mellitus (HCC)    Controlled cholesterol on statin and zetia, with some limited lifestyle Last lipid panel 05/2018 Known CAD/PAD   Plan: 1. Continue current meds - Atorvastatin 80mg  daily, Zetia 10mg  daily 2. Continue ASA 81 and Plavix 75 DAPT for secondary ASCVD risk reduction 3. Encourage improved lifestyle - low carb/cholesterol, reduce portion size, continue improving regular exercise 4. Follow-up yearly lipids      Morbid obesity with BMI of 50.0-59.9, adult (HCC)    Wt loss recently with improved diet, reduce portions Encourage continue lifestyle      Spondylosis of lumbar region without myelopathy or radiculopathy    Chronic low back pain Limiting his function for participating in jury duty       Other Visit Diagnoses    Annual physical exam    -  Primary   Needs flu shot       Relevant Orders   Flu Vaccine QUAD 36+ mos IM (Completed)      Updated Health Maintenance information - He should call [facility] to re-schedule colonoscopy Reviewed recent lab results with patient Encouraged improvement to lifestyle with diet and exercise - Goal of weight loss   Orders Placed This Encounter  Procedures  . Flu Vaccine QUAD 36+ mos IM   No orders of the defined types were placed in this encounter.   Follow up plan: Return in about 6 months (around 11/23/2018) for 6 month DM A1c, HTN, Leg Pain.  Saralyn PilarAlexander , DO [facility] 05/25/2018, 10:24 AM

## 2018-05-25 NOTE — Patient Instructions (Addendum)
Thank you for coming to the office today.  1. Chemistry - Normal results, including electrolytes, kidney and liver function  2. Hemoglobin A1c (Diabetes) - 6.7, improved from previous 7.2  3. Routine screening HIV and Hepatitis C - Both Negative.  4. PSA Prostate Cancer Screening - 0.4, negative.  5. TSH Thyroid Function Tests - Normal.  6. Cholesterol - Normal cholesterol results. Controlled on Atorvastatin 80  7. CBC Blood Counts - 13.1, stable hemoglobin, previous history of anemia.  We will order a circulation test for evaluating possible PAD in lower extremity - from Dr Ethelene HalGollans office, heart doctors. Stay tuned.  Please schedule a Follow-up Appointment to: Return in about 6 months (around 11/23/2018) for 6 month DM A1c, HTN, Leg Pain.  If you have any other questions or concerns, please feel free to call the office or send a message through MyChart. You may also schedule an earlier appointment if necessary.  Additionally, you may be receiving a survey about your experience at our office within a few days to 1 week by e-mail or mail. We value your feedback.  Craig PilarAlexander Kelci Petrella, DO [facility]

## 2018-05-25 NOTE — Assessment & Plan Note (Signed)
Persistent chronic problem Uncertain etiology, see prior note Risk factors of PAD Symptoms not entirely consistent of claudication  Plan Order ABI testing bilateral LE at cardiology - f/u results

## 2018-05-25 NOTE — Assessment & Plan Note (Signed)
Chronic low back pain Limiting his function for participating in jury duty

## 2018-05-25 NOTE — Assessment & Plan Note (Signed)
Asymptomatic without active angina, controlled on long acting nitrate S/p CABG 2016 3 v [facility] On med management now  Order PAD testing w/ ABI at Cardiology - given his episodes of chronic LLE pain

## 2018-05-25 NOTE — Assessment & Plan Note (Signed)
Wt loss recently with improved diet, reduce portions Encourage continue lifestyle

## 2018-05-25 NOTE — Assessment & Plan Note (Signed)
Improved DM control to A1c 6.7 No episodes of hypoglycemia. Improved hyperglycemia Complications - vascular disease including CAD s/p CABG, PAD, among other including hyperlipidemia, morbid obesity, and OSA - increases risk of future cardiovascular complications and poor glucose control  Plan:  1. Continue current therapy - refilled Humalog vials 75/25 mix 17u BID - may adjust minor dose +/- 1-2 unit as needed - Continue Metformin 1000mg  BID - Again reviewed briefly GLP1 medications, review benefits wt loss, A1c control, reduce cardiovascular events, and will consider these in future especially if not improving or worse - he is hesitant on cost 2. Encourage improved lifestyle - low carb, low sugar diet, reduce portion size, continue improving regular exercise 3. Check CBG, bring log to next visit for review 4. Continue ASA, ACEi, Statin 5. UTD DM Ophtho 04/2018 6. Follow-up 6 months A1c

## 2018-06-30 ENCOUNTER — Other Ambulatory Visit: Payer: Self-pay | Admitting: Family Medicine

## 2018-06-30 ENCOUNTER — Telehealth: Payer: Self-pay | Admitting: Family Medicine

## 2018-06-30 ENCOUNTER — Ambulatory Visit (INDEPENDENT_AMBULATORY_CARE_PROVIDER_SITE_OTHER): Payer: Medicaid Other

## 2018-06-30 DIAGNOSIS — M79605 Pain in left leg: Secondary | ICD-10-CM | POA: Diagnosis not present

## 2018-06-30 DIAGNOSIS — G8929 Other chronic pain: Secondary | ICD-10-CM

## 2018-06-30 DIAGNOSIS — E785 Hyperlipidemia, unspecified: Principal | ICD-10-CM

## 2018-06-30 DIAGNOSIS — E1169 Type 2 diabetes mellitus with other specified complication: Secondary | ICD-10-CM

## 2018-06-30 DIAGNOSIS — I25118 Atherosclerotic heart disease of native coronary artery with other forms of angina pectoris: Secondary | ICD-10-CM

## 2018-06-30 NOTE — Telephone Encounter (Signed)
[facility] [address]

## 2018-08-01 ENCOUNTER — Other Ambulatory Visit: Payer: Self-pay | Admitting: Family Medicine

## 2018-08-01 DIAGNOSIS — M79605 Pain in left leg: Principal | ICD-10-CM

## 2018-08-01 DIAGNOSIS — G8929 Other chronic pain: Secondary | ICD-10-CM

## 2018-08-01 NOTE — Telephone Encounter (Signed)
Pt needs refill on baclofen sent to [facility] .

## 2018-08-02 MED ORDER — BACLOFEN 10 MG PO TABS: 5.0000 mg | ORAL_TABLET | Freq: Two times a day (BID) | ORAL | 5 refills | Status: DC | PRN

## 2018-08-31 ENCOUNTER — Telehealth: Payer: Self-pay | Admitting: Family Medicine

## 2018-08-31 ENCOUNTER — Other Ambulatory Visit: Payer: Self-pay

## 2018-08-31 DIAGNOSIS — M79605 Pain in left leg: Principal | ICD-10-CM

## 2018-08-31 DIAGNOSIS — G8929 Other chronic pain: Secondary | ICD-10-CM

## 2018-08-31 MED ORDER — CLOPIDOGREL BISULFATE 75 MG PO TABS: 75.0000 mg | ORAL_TABLET | Freq: Every day | ORAL | 3 refills | Status: DC

## 2018-08-31 MED ORDER — BACLOFEN 10 MG PO TABS: 5.0000 mg | ORAL_TABLET | Freq: Two times a day (BID) | ORAL | 5 refills | Status: DC | PRN

## 2018-08-31 NOTE — Telephone Encounter (Signed)
Pt needs refills on clopidogrel and baclofen sent to [facility].  His call back number is [phone]

## 2018-10-25 ENCOUNTER — Other Ambulatory Visit: Payer: Self-pay | Admitting: Family Medicine

## 2018-10-25 DIAGNOSIS — M79605 Pain in left leg: Secondary | ICD-10-CM

## 2018-10-25 DIAGNOSIS — E1169 Type 2 diabetes mellitus with other specified complication: Secondary | ICD-10-CM

## 2018-10-25 DIAGNOSIS — E785 Hyperlipidemia, unspecified: Secondary | ICD-10-CM

## 2018-10-25 DIAGNOSIS — I2510 Atherosclerotic heart disease of native coronary artery without angina pectoris: Secondary | ICD-10-CM

## 2018-10-25 DIAGNOSIS — E1159 Type 2 diabetes mellitus with other circulatory complications: Secondary | ICD-10-CM

## 2018-10-25 DIAGNOSIS — E1151 Type 2 diabetes mellitus with diabetic peripheral angiopathy without gangrene: Secondary | ICD-10-CM

## 2018-10-25 DIAGNOSIS — I10 Essential (primary) hypertension: Secondary | ICD-10-CM

## 2018-10-25 DIAGNOSIS — R0789 Other chest pain: Secondary | ICD-10-CM

## 2018-10-25 DIAGNOSIS — G8929 Other chronic pain: Secondary | ICD-10-CM

## 2018-10-25 MED ORDER — METOPROLOL SUCCINATE ER 50 MG PO TB24: ORAL_TABLET | ORAL | 1 refills | Status: DC

## 2018-10-25 MED ORDER — BACLOFEN 10 MG PO TABS: 5.0000 mg | ORAL_TABLET | Freq: Two times a day (BID) | ORAL | 5 refills | Status: DC | PRN

## 2018-10-25 MED ORDER — "BD VEO INSULIN SYRINGE U/F 31G X 15/64"" 0.3 ML MISC": 5 refills | Status: DC

## 2018-10-25 MED ORDER — INSULIN LISPRO PROT & LISPRO (75-25 MIX) 100 UNIT/ML ~~LOC~~ SUSP: 17.0000 [IU] | Freq: Two times a day (BID) | SUBCUTANEOUS | 11 refills | Status: DC

## 2018-10-25 MED ORDER — SYRINGE (DISPOSABLE) 3 ML MISC: 5 refills | Status: DC

## 2018-10-25 MED ORDER — CLOPIDOGREL BISULFATE 75 MG PO TABS: 75.0000 mg | ORAL_TABLET | Freq: Every day | ORAL | 3 refills | Status: DC

## 2018-10-25 MED ORDER — HYDROCHLOROTHIAZIDE 25 MG PO TABS: 25.0000 mg | ORAL_TABLET | Freq: Every day | ORAL | 0 refills | Status: DC

## 2018-10-25 MED ORDER — ATORVASTATIN CALCIUM 80 MG PO TABS: 80.0000 mg | ORAL_TABLET | Freq: Every day | ORAL | 1 refills | Status: DC

## 2018-11-14 ENCOUNTER — Other Ambulatory Visit: Payer: Self-pay | Admitting: Family Medicine

## 2018-11-14 DIAGNOSIS — I10 Essential (primary) hypertension: Secondary | ICD-10-CM

## 2018-11-14 DIAGNOSIS — R0789 Other chest pain: Secondary | ICD-10-CM

## 2018-11-14 DIAGNOSIS — I2510 Atherosclerotic heart disease of native coronary artery without angina pectoris: Secondary | ICD-10-CM

## 2018-11-14 DIAGNOSIS — E1169 Type 2 diabetes mellitus with other specified complication: Secondary | ICD-10-CM

## 2018-11-14 DIAGNOSIS — E1159 Type 2 diabetes mellitus with other circulatory complications: Secondary | ICD-10-CM

## 2018-11-14 MED ORDER — METFORMIN HCL 1000 MG PO TABS: 1000.0000 mg | ORAL_TABLET | Freq: Two times a day (BID) | ORAL | 1 refills | Status: DC

## 2018-11-14 MED ORDER — HYDROCHLOROTHIAZIDE 25 MG PO TABS: 25.0000 mg | ORAL_TABLET | Freq: Every day | ORAL | 1 refills | Status: DC

## 2018-11-14 MED ORDER — EZETIMIBE 10 MG PO TABS: 10.0000 mg | ORAL_TABLET | Freq: Every day | ORAL | 1 refills | Status: DC

## 2018-11-14 MED ORDER — ISOSORBIDE MONONITRATE ER 30 MG PO TB24: 30.0000 mg | ORAL_TABLET | Freq: Every day | ORAL | 1 refills | Status: DC

## 2018-11-14 MED ORDER — LISINOPRIL 10 MG PO TABS: 10.0000 mg | ORAL_TABLET | Freq: Every day | ORAL | 1 refills | Status: DC

## 2018-11-14 MED ORDER — METOPROLOL SUCCINATE ER 50 MG PO TB24: ORAL_TABLET | ORAL | 1 refills | Status: DC

## 2018-11-14 NOTE — Telephone Encounter (Signed)
Pt  Called requesting refill on  zetia 10 mg,hydrodiuril 25 mg,toprol 50 mg ,lisinopril 10 mg, isosorbide 30 mg,metformin 1000 mg [facility]  [facility]  [address]

## 2018-11-23 ENCOUNTER — Other Ambulatory Visit: Payer: Self-pay | Admitting: Family Medicine

## 2018-11-23 DIAGNOSIS — E1151 Type 2 diabetes mellitus with diabetic peripheral angiopathy without gangrene: Secondary | ICD-10-CM

## 2018-11-24 ENCOUNTER — Ambulatory Visit: Payer: Medicaid Other | Admitting: Family Medicine

## 2018-12-13 ENCOUNTER — Ambulatory Visit: Payer: Medicaid Other | Admitting: Family Medicine

## 2018-12-18 ENCOUNTER — Other Ambulatory Visit: Payer: Self-pay | Admitting: Family Medicine

## 2018-12-18 ENCOUNTER — Ambulatory Visit (INDEPENDENT_AMBULATORY_CARE_PROVIDER_SITE_OTHER): Payer: Medicaid Other | Admitting: Family Medicine

## 2018-12-18 ENCOUNTER — Other Ambulatory Visit: Payer: Self-pay

## 2018-12-18 ENCOUNTER — Encounter: Payer: Self-pay | Admitting: Family Medicine

## 2018-12-18 VITALS — BP 150/90 | HR 100 | Temp 97.6°F | Resp 16 | Ht 67.0 in | Wt 337.0 lb

## 2018-12-18 DIAGNOSIS — Z0000 Encounter for general adult medical examination without abnormal findings: Secondary | ICD-10-CM

## 2018-12-18 DIAGNOSIS — I10 Essential (primary) hypertension: Secondary | ICD-10-CM

## 2018-12-18 DIAGNOSIS — E1151 Type 2 diabetes mellitus with diabetic peripheral angiopathy without gangrene: Secondary | ICD-10-CM

## 2018-12-18 DIAGNOSIS — Z6841 Body Mass Index (BMI) 40.0 and over, adult: Secondary | ICD-10-CM

## 2018-12-18 DIAGNOSIS — G4733 Obstructive sleep apnea (adult) (pediatric): Secondary | ICD-10-CM

## 2018-12-18 DIAGNOSIS — I25118 Atherosclerotic heart disease of native coronary artery with other forms of angina pectoris: Secondary | ICD-10-CM

## 2018-12-18 DIAGNOSIS — E785 Hyperlipidemia, unspecified: Secondary | ICD-10-CM

## 2018-12-18 DIAGNOSIS — E1169 Type 2 diabetes mellitus with other specified complication: Secondary | ICD-10-CM

## 2018-12-18 DIAGNOSIS — Z125 Encounter for screening for malignant neoplasm of prostate: Secondary | ICD-10-CM

## 2018-12-18 LAB — POCT GLYCOSYLATED HEMOGLOBIN (HGB A1C): Hemoglobin A1C: 7.2 % — AB (ref 4.0–5.6)

## 2018-12-18 NOTE — Progress Notes (Signed)
Subjective:    Patient ID: Craig Harvey, male    DOB: 1955/09/22, 63 y.o.   MRN: [id number]  Craig ChallengerDavid L Macqueen is a 63 y.o. male presenting on 12/18/2018 for Diabetes   HPI   CHRONIC DM, Type 2: Last A1c 7.2, up from prior 6.7 CBGs: Home checks 115-124, no low sugar >100, high < 175 Meds:Metformin 1000mg  BID, HumalogVials75/25 - 17u BID wc - Never on GLP or Basal Insulin Reports good compliance. Tolerating well w/o side-effects Currently on ACEi Lifestyle: - Diet (trying to improve diet) Admits rare hypoglycemia (none in past >6 months) Denies hypoglycemia, polyuria, visual changes, numbness or tingling.  CAD / Vascular Disease / S/p CABG [facility] Dr Mariah MillingGollan. history of CABG 01/2015 He continues on DAPT with Plavix 75 and ASA 81 Continues on BB, Imdur, Statin, Zetia history of chest tightness, as discussed before with Cardiology, he reports recently he was doing some lifting with groceries, and his tightness in chest sort of "released" like a "pressure valve" and he felt overall better, reduced tightness in chest and felt better. No new concerns. Next apt with Cardiology in 1 month   [facility] 12/18/2018 05/25/2018 12/07/2017  Decreased Interest 0 0 0  Down, Depressed, Hopeless 0 0 0  PHQ - 2 Score 0 0 0  Altered sleeping - - 0  Tired, decreased energy - - 0  Change in appetite - - 0  Feeling bad or failure about yourself  - - 0  Trouble concentrating - - 0  Moving slowly or fidgety/restless - - 0  Suicidal thoughts - - 0  PHQ-9 Score - - 0  Difficult doing work/chores - - Not difficult at all    Social History   Tobacco Use  . Smoking status: Former Smoker    Quit date: 08/06/1975    Years since quitting: 43.3  . Smokeless tobacco: Never Used  Substance Use Topics  . Alcohol use: No  . Drug use: No    Review of Systems Per HPI unless specifically indicated above     Objective:    BP (!) 150/90   Pulse 100   Temp  97.6 F (36.4 C) (Oral)   Resp 16   Ht 5\' 7"  (1.702 m)   Wt (!) 337 lb (152.9 kg)   BMI 52.78 kg/m   Wt Readings from Last 3 Encounters:  12/18/18 (!) 337 lb (152.9 kg)  05/25/18 (!) 331 lb (150.1 kg)  01/25/18 (!) 343 lb 8 oz (155.8 kg)    Physical Exam Vitals signs and nursing note reviewed.  Constitutional:      General: He is not in acute distress.    Appearance: He is well-developed. He is not diaphoretic.     Comments: Well-appearing, comfortable, cooperative, morbidly obese  HENT:     Head: Normocephalic and atraumatic.  Eyes:     General:        Right eye: No discharge.        Left eye: No discharge.     Conjunctiva/sclera: Conjunctivae normal.  Neck:     Musculoskeletal: Normal range of motion and neck supple.     Thyroid: No thyromegaly.  Cardiovascular:     Rate and Rhythm: Normal rate and regular rhythm.     Heart sounds: Normal heart sounds. No murmur.  Pulmonary:     Effort: Pulmonary effort is normal. No respiratory distress.     Breath sounds: Normal breath sounds. No wheezing or rales.  Musculoskeletal: Normal range of motion.  Lymphadenopathy:     Cervical: No cervical adenopathy.  Skin:    General: Skin is warm and dry.     Findings: No erythema or rash.  Neurological:     Mental Status: He is alert and oriented to person, place, and time.  Psychiatric:        Behavior: Behavior normal.     Comments: Well groomed, good eye contact, normal speech and thoughts      Diabetic Foot Exam - Simple   Simple Foot Form Diabetic Foot exam was performed with the following findings: Yes 12/18/2018  3:36 PM  Visual Inspection No deformities, no ulcerations, no other skin breakdown bilaterally: Yes Sensation Testing Intact to touch and monofilament testing bilaterally: Yes Pulse Check Posterior Tibialis and Dorsalis pulse intact bilaterally: Yes Comments      Recent Labs    05/18/18 0928 12/18/18 1513  HGBA1C 6.7* 7.2*     Results for orders  placed or performed in visit on 12/18/18  POCT HgB A1C  Result Value Ref Range   Hemoglobin A1C 7.2 (A) 4.0 - 5.6 %      Assessment & Plan:   Problem List Items Addressed This Visit    DM (diabetes mellitus), type 2 with peripheral vascular complications ([place]) - Primary    Elevated A1c 7.2 from 6.7, mild worsening DM control, still near goal A1c < 7 No episodes of hypoglycemia. Occasional hyperglycemia. Complications - vascular disease including CAD s/p CABG, PAD, among other including hyperlipidemia, morbid obesity, and OSA - increases risk of future cardiovascular complications and poor glucose control  Plan:  1. Continue current therapy - Humalog vials 75/25 mix 17u BID - may adjust minor dose +/- 1-2 unit as needed - Continue Metformin 1000mg  BID - Again reviewed briefly GLP1 medications, review benefits wt loss, A1c control, reduce cardiovascular events, and will consider these in future especially if not improving or worse - he is hesitant on cost 2. Encourage improved lifestyle - low carb, low sugar diet, reduce portion size, continue improving regular exercise 3. Check CBG, bring log to next visit for review 4. Continue ASA, ACEi, Statin 5. UTD DM Ophtho 04/2018 6. Follow-up 6 months annual and labs  Referral to [place] for medication assistance w/ Medicaid for GLP1 agent or other option.      Relevant Orders   POCT HgB A1C (Completed)   Ambulatory referral to Chronic Care Management Services       No orders of the defined types were placed in this encounter.  Referral to [place] for assistance with med management and financial asst - currently on humalog mix insulin 75/25 17u BID, but would greatly benefit from GLP1 better A1c control, wt loss and cardiovascular benefit- he has some financial barriers, on medicaid only covered it seems on PDL are bydureon pen and victoza, we could consider victoza, otherwise - he would prefer a once weekly option and possibly  titrate down on Humalog, appreciate any input or recommendations on transitioning and which GLP1 agents would be available to this patient  Orders Placed This Encounter  Procedures  . Ambulatory referral to Chronic Care Management Services    Referral Priority:   Routine    Referral Type:   Consultation    Referral Reason:   Care Coordination    Number of Visits Requested:   1  . POCT HgB A1C    Follow up plan: Return in about 6 months (around 06/19/2019) for Annual  Physical.  Future labs ordered for 06/2019   Saralyn PilarAlexander Karamalegos, DO [facility] 12/18/2018, 3:07 PM

## 2018-12-18 NOTE — Assessment & Plan Note (Addendum)
Elevated A1c 7.2 from 6.7, mild worsening DM control, still near goal A1c < 7 No episodes of hypoglycemia. Occasional hyperglycemia. Complications - vascular disease including CAD s/p CABG, PAD, among other including hyperlipidemia, morbid obesity, and OSA - increases risk of future cardiovascular complications and poor glucose control  Plan:  1. Continue current therapy - Humalog vials 75/25 mix 17u BID - may adjust minor dose +/- 1-2 unit as needed - Continue Metformin 1000mg  BID - Again reviewed briefly GLP1 medications, review benefits wt loss, A1c control, reduce cardiovascular events, and will consider these in future especially if not improving or worse - he is hesitant on cost 2. Encourage improved lifestyle - low carb, low sugar diet, reduce portion size, continue improving regular exercise 3. Check CBG, bring log to next visit for review 4. Continue ASA, ACEi, Statin 5. UTD DM Ophtho 04/2018 6. Follow-up 6 months annual and labs  Referral to [place] for medication assistance w/ Medicaid for GLP1 agent or other option.

## 2018-12-18 NOTE — Addendum Note (Signed)
Addended by: [name] on: 12/18/2018 03:35 PM   Modules accepted: Orders

## 2018-12-18 NOTE — Patient Instructions (Addendum)
Thank you for coming to the office today.  For now keep on same medicine. No change.  Considering switch to either a once weekly or once daily new diabetes medicine  Stay tuned for phone call from Grayland Ormond from [place] to help with assistance on this medication  Talk to Dr Rockey Situ about the chest pressure improvement / changes   DUE for FASTING BLOOD WORK (no food or drink after midnight before the lab appointment, only water or coffee without cream/sugar on the morning of)  SCHEDULE "Lab Only" visit in the morning at the clinic for lab draw in 6 MONTHS   - Make sure Lab Only appointment is at about 1 week before your next appointment, so that results will be available  For Lab Results, once available within 2-3 days of blood draw, you can can log in to MyChart online to view your results and a brief explanation. Also, we can discuss results at next follow-up visit.   Please schedule a Follow-up Appointment to: Return in about 6 months (around 06/19/2019) for Annual Physical.  If you have any other questions or concerns, please feel free to call the office or send a message through [place]. You may also schedule an earlier appointment if necessary.  Additionally, you may be receiving a survey about your experience at our office within a few days to 1 week by e-mail or mail. We value your feedback.  [name], DO [place]

## 2018-12-21 ENCOUNTER — Ambulatory Visit: Payer: Self-pay | Admitting: Pharmacist

## 2018-12-21 DIAGNOSIS — E1151 Type 2 diabetes mellitus with diabetic peripheral angiopathy without gangrene: Secondary | ICD-10-CM

## 2018-12-21 NOTE — Chronic Care Management (AMB) (Signed)
  Care Management Note   Craig Harvey is a 63 y.o. year old male who is a primary care patient of Craig Hauser, DO. The CM team was consulted for assistance with chronic disease management and care coordination for diabetes medication management and medication assistance.   I reached out to [name] by phone today. Left a HIPAA compliant message for patient.   Follow Up Plan: The care management team will reach out to the patient again over the next 5 days.    Harlow Asa, PharmD, [place] [employer] [phone]

## 2018-12-25 ENCOUNTER — Other Ambulatory Visit: Payer: Self-pay | Admitting: Family Medicine

## 2018-12-25 DIAGNOSIS — E1151 Type 2 diabetes mellitus with diabetic peripheral angiopathy without gangrene: Secondary | ICD-10-CM

## 2018-12-25 NOTE — Telephone Encounter (Signed)
Pt. Called requesting refill on needles , Humalog 75/25 ,  Lipitor  [address]ed [address] [facility] [address]  Call back # is  [phone]

## 2018-12-26 ENCOUNTER — Ambulatory Visit: Payer: Self-pay | Admitting: Pharmacist

## 2018-12-26 ENCOUNTER — Telehealth: Payer: Self-pay

## 2018-12-26 DIAGNOSIS — E1151 Type 2 diabetes mellitus with diabetic peripheral angiopathy without gangrene: Secondary | ICD-10-CM

## 2018-12-26 NOTE — Chronic Care Management (AMB) (Signed)
  Care Management   Note  12/26/2018 Name: Craig Harvey MRN: [id number] DOB: [birth year]  Craig Harvey is a 63 y.o. year old male who is a primary care patient of [name], DO. The CM team was consulted for assistance with chronic disease management and care coordination for diabetes medication management and medication assistance.   I reached out to [name] by phone today. Leave message with Suan Halter, patient's niece requesting a call back. Note Rosann Auerbach listed on patient's designated party release in chart.   Follow Up Plan: The care management team will reach out to the patient again over the next 14 days.    Harlow Asa, PharmD, [place] [employer] [phone]

## 2018-12-27 ENCOUNTER — Telehealth: Payer: Self-pay

## 2018-12-27 DIAGNOSIS — E1151 Type 2 diabetes mellitus with diabetic peripheral angiopathy without gangrene: Secondary | ICD-10-CM

## 2018-12-27 MED ORDER — NOVOLIN 70/30 (70-30) 100 UNIT/ML ~~LOC~~ SUSP: 17.0000 [IU] | Freq: Two times a day (BID) | SUBCUTANEOUS | 3 refills | Status: DC

## 2018-12-27 NOTE — Addendum Note (Signed)
Addended by: [name] on: 12/27/2018 06:24 PM   Modules accepted: Orders

## 2018-12-27 NOTE — Telephone Encounter (Signed)
As per patient he needs to pay $340 for Humalog that he is using twice daily could be quantity issue that medicare is not covering it so he runs out day earlier, so he is asking for Novolin Rx additional which he have to pay only $26 cash when he talk to the pharmacy. I don't see this Rx in his current active list don't know if he needed another Rx send.

## 2018-12-27 NOTE — Telephone Encounter (Signed)
Switched rx to novolin same dose  [name], DO [facility] 12/27/2018, 6:24 PM

## 2018-12-28 ENCOUNTER — Telehealth: Payer: Self-pay | Admitting: Cardiovascular Disease

## 2018-12-28 MED ORDER — HUMALOG MIX 75/25 (75-25) 100 UNIT/ML ~~LOC~~ SUSP: 17.0000 [IU] | Freq: Two times a day (BID) | SUBCUTANEOUS | 5 refills | Status: DC

## 2018-12-28 NOTE — Telephone Encounter (Signed)
[place].  Confirmed Humalog mix is covered. It was a quantity issue with insurance. He may be using slightly more than supposed to.  New rx sent with adjusted instructions, still same amount 10 mL but insurance approval should be good now.  DC'd Novolin.  [name], [facility] 12/28/2018, 12:24 PM

## 2018-12-28 NOTE — Telephone Encounter (Signed)
He should not take both. I am not quite sure what he wants then.  I have already ordered Novolin.  He should take one or the other.  [name], [facility] 12/28/2018, 11:40 AM

## 2018-12-28 NOTE — Telephone Encounter (Signed)
The pt was notified. No questions or concerns. 

## 2018-12-28 NOTE — Telephone Encounter (Signed)
Pt c/o of Chest Pain: STAT if CP now or developed within 24 hours  1. Are you having CP right now? No 6/19 single episode tightness   2. Are you experiencing any other symptoms (ex. SOB, nausea, vomiting, sweating)? Chest collapsing feeling felt lightheaded after tightness   3. How long have you been experiencing CP? 12/21/17 only episode   4. Is your CP continuous or coming and going?   5. Have you taken Nitroglycerin?  No   Scheduled in august as patient declined virtual and declined app appt sooner    ?

## 2018-12-28 NOTE — Telephone Encounter (Signed)
Spoke with the pt. Pt sts that one 12/22/18 he had  one episode of chest tightness after bringing in groceries. Pt sts that he was outside for a while and has been having lower back pain/back spasms for a couple of weeks. He did ck his BP during the episode 109/78 106bpm. Pt sts that once inside he was able to cool off and rest and the chest tightness resolved. Pt denies any associated symptoms.  Pt has not had any reoccurrence of chest tightness  Pt sts that he does have chest tightness from time to time since his CABG. Pt sts that Dr. Rockey Situ had instructed him to avoid lifting. He has since purchased a wagon to bring in his groceries.    Adv the pt to monitor symptoms, avoid heavy lifting, and prolonged exposure to heat. Pt is scheduled to see Dr. Rockey Situ in [place] the pt to contact the office sooner if symptoms reoccur. Pt verbalized understanding.

## 2018-12-28 NOTE — Addendum Note (Signed)
Addended by: [name] on: 12/28/2018 12:25 PM   Modules accepted: Orders

## 2018-12-28 NOTE — Telephone Encounter (Signed)
As per patient he doesn't want to switch over but Novolin is for additional day when he runs out Humalog and I can call pharmacy for clarification.

## 2018-12-29 ENCOUNTER — Telehealth: Payer: Self-pay

## 2019-01-09 ENCOUNTER — Ambulatory Visit: Payer: Self-pay | Admitting: Pharmacist

## 2019-01-09 ENCOUNTER — Telehealth: Payer: Self-pay

## 2019-01-09 ENCOUNTER — Telehealth: Payer: Self-pay | Admitting: Family Medicine

## 2019-01-09 NOTE — Chronic Care Management (AMB) (Signed)
  Care Management   Note  01/09/2019 Name: ORY ELTING MRN: [id number] DOB: 1956-03-16  DOMENIC SCHOENBERGER is a 63 y.o. year old male who is a primary care patient of [name], DO. The CM team was consulted for assistance with chronic disease management and care coordination for diabetes medication management and medication assistance.   I reached out to [name] by phone today. Outreach attempt #3. Leave another message with Suan Halter, patient's niece requesting a call back. Note Rosann Auerbach listed on patient's designated party release in chart. Rosann Auerbach reports that she communicated my message to Mr. Trevino the last time that I called and states that she will provide him with my number again today.   Follow Up Plan: The care management team is available to follow up with the patient after provider conversation with the patient regarding recommendation for care management engagement and subsequent re-referral to the care management team.    Harlow Asa, PharmD, [place] [phone]

## 2019-01-09 NOTE — Telephone Encounter (Signed)
Craig Harvey was unsuccessful in reaching patient by phone over past 2-3 weeks.  Can you please try to reach patient from our office, to see if we can figure out why he was missing her calls and make sure that he calls her instead now if he would like any financial assistance and help with his diabetes medications?  [name], [facility] 01/09/2019, 6:34 PM

## 2019-01-09 NOTE — Telephone Encounter (Signed)
-----   Message from Vella Raring, [facility] sent at 01/09/2019  6:08 PM EDT ----- Dr. Parks Ranger,  I have been unable to reach Mr. Benney after 3 attempts and messages. I would be happy to work with Mr. Cunliffe to address his medication assistance concerns and diabetes management. However, for now I will not outreach to him further, but await his call back. When you next have contact with him, would you please discuss whether he is still interested in CCM services?  Thank you!  Grayland Ormond

## 2019-01-10 NOTE — Telephone Encounter (Signed)
Spoke to the patient he is willing to call Craig Harvey, send a secure message to her.

## 2019-01-25 ENCOUNTER — Telehealth: Payer: Self-pay

## 2019-01-25 DIAGNOSIS — M79605 Pain in left leg: Secondary | ICD-10-CM

## 2019-01-25 DIAGNOSIS — E1159 Type 2 diabetes mellitus with other circulatory complications: Secondary | ICD-10-CM

## 2019-01-25 DIAGNOSIS — G8929 Other chronic pain: Secondary | ICD-10-CM

## 2019-01-25 DIAGNOSIS — E1151 Type 2 diabetes mellitus with diabetic peripheral angiopathy without gangrene: Secondary | ICD-10-CM

## 2019-01-25 MED ORDER — "BD VEO INSULIN SYRINGE U/F 31G X 15/64"" 0.3 ML MISC": 3 refills | Status: DC

## 2019-01-25 MED ORDER — SYRINGE (DISPOSABLE) 3 ML MISC: 5 refills | Status: AC

## 2019-01-25 MED ORDER — HUMALOG MIX 75/25 (75-25) 100 UNIT/ML ~~LOC~~ SUSP: 17.0000 [IU] | Freq: Two times a day (BID) | SUBCUTANEOUS | 5 refills | Status: DC

## 2019-01-25 MED ORDER — BACLOFEN 10 MG PO TABS: 5.0000 mg | ORAL_TABLET | Freq: Two times a day (BID) | ORAL | 1 refills | Status: DC | PRN

## 2019-01-25 NOTE — Telephone Encounter (Signed)
I re ordered. We had problem with his refills before, he was requesting them too early and perhaps there was issue with the supply given at pharmacy, this should have been resolved when I spoke with [facility] pharmacist back in June 2020, they should have changed the amount he was receiving and he has refills on file, I went ahead and re ordered. He needs to check with pharmacy to see why running out too often if still problem  Craig Harvey, [facility] 01/25/2019, 12:38 PM

## 2019-01-25 NOTE — Telephone Encounter (Signed)
Refill on synergies and needles and Humalog.  He also wants the baclofen to be for 90 days.  [facility] [address]

## 2019-02-13 ENCOUNTER — Telehealth: Payer: Self-pay | Admitting: Family Medicine

## 2019-02-13 NOTE — Telephone Encounter (Signed)
Pt  Called  requesting refills on Ezetimibe, hydrochlorothiazide, metformin,lisinopril, isosorbide mononitrate  [facility] [address]

## 2019-02-14 NOTE — Telephone Encounter (Signed)
Patient advised he still has refill left to call the pharmacy.

## 2019-02-18 NOTE — Progress Notes (Signed)
Cardiology Office Note  Date:  02/19/2019   ID:  Craig Harvey, DOB 27-Feb-1956, MRN [id number]  PCP:  [name], DO   Chief Complaint  Patient presents with  . other    12 mo f/u. Medications revieded verbally. "Doing well" some tightness in chest from surgery and heat.    HPI:  63 year old male with known history of  HTN,  obesity,  OSA not on CPAP,  CAD s/p 3 vessel CABG on 01/14/2015,  DM,  HLD  who presents for routine follow-up of his coronary artery disease [facility]  Nerve pain left leg Pain with sitting   114/83, up to 130 at home High here today  Back in June, 90+ deg, back hurting BP elevated, chest got tight Since then batter, Bought a wagon  Has not been doing any regular exercise  chronic low back pain  Unable to lose any weight  Lab work reviewed with him HBA1C 6.4 up to 7.2 Most recent LDL 65, total chol 120  EKG personally reviewed by myself on todays visit Shows normal sinus rhythm rate 85 bpm old anteroseptal MI  Other past medical history presented to [facility] on 01/06/2015 with complaints of nausea, feeling hot, and chest pain with radiation into his left shoulder. EKG was unremarkable in ED and Cardiac enzymes were negative.  Echo on 7/4 showed EF 60-65%, no regional wall motion abnormalities, no valvular abnormalities, PASP normal, normal study.  He underwent nuclear stress test that showed large defect of moderate severity in the mid anterior, mid anteroseptal, mid anterolateral, apical anterior, apical lateral and apex locations. F   underwent cardiac catheterization which showed severe 2 vessel CAD (ost LAD 70%, mid LAD 99%, ost LCx to prox LCx 95% after the take off of OM1, mid LCx 80% prior to takeoff to OM2, normal LV function).It was felt CABG would be indicated and the patient was transferred to [facility] for further care.   He underwent successful 3 vessel CABG (LIMA-LAD, SVG-OM, SVG-Ramus)  on 01/14/2015. Post-op he developed Afib and converted on IV amiodarone. His A1C was poorly controlled coming in at 10.0%.    PMH:   has a past medical history of CAD (coronary artery disease), Hypertension, Morbid obesity ([place]), and OSA (obstructive sleep apnea).  PSH:    Past Surgical History:  Procedure Laterality Date  . CARDIAC CATHETERIZATION N/A 01/09/2015   Procedure: Left Heart Cath and Coronary Angiography;  Surgeon: Minna Merritts, MD;  Location: [place] [facility];  Service: Cardiovascular;  Laterality: N/A;  . CORONARY ARTERY BYPASS GRAFT N/A 01/14/2015   Procedure: CORONARY ARTERY BYPASS GRAFTING (CABG), ON PUMP, TIMES THREE, USING LEFT INTERNAL MAMMARY ARTERY, RIGHT GREATER SAPHENOUS VEIN HARVESTED ENDOSCOPICALLY;  Surgeon: Ivin Poot, MD;  Location: [place];  Service: Open Heart Surgery;  Laterality: N/A;  . HERNIA REPAIR    . TEE WITHOUT CARDIOVERSION N/A 01/14/2015   Procedure: TRANSESOPHAGEAL ECHOCARDIOGRAM (TEE);  Surgeon: Ivin Poot, MD;  Location: [place];  Service: Open Heart Surgery;  Laterality: N/A;    Current Outpatient Medications  Medication Sig Dispense Refill  . aspirin EC 81 MG tablet Take 1 tablet (81 mg total) by mouth daily. 90 tablet 3  . atorvastatin (LIPITOR) 80 MG tablet Take 1 tablet (80 mg total) by mouth daily. 90 tablet 1  . baclofen (LIORESAL) 10 MG tablet Take 0.5-1 tablets (5-10 mg total) by mouth 2 (two) times daily as needed for muscle  spasms (leg pain). 180 each 1  . clopidogrel (PLAVIX) 75 MG tablet Take 1 tablet (75 mg total) by mouth daily. 90 tablet 3  . ezetimibe (ZETIA) 10 MG tablet Take 1 tablet (10 mg total) by mouth daily. 90 tablet 1  . hydrochlorothiazide (HYDRODIURIL) 25 MG tablet Take 1 tablet (25 mg total) by mouth daily. 90 tablet 1  . insulin lispro protamine-lispro (HUMALOG MIX 75/25) (75-25) 100 UNIT/ML SUSP injection Inject 17 Units into the skin 2 (two) times daily with a meal. Up to max 50 units 10 mL 5  . Insulin  Syringe-Needle U-100 (BD VEO INSULIN SYRINGE U/F) 31G X 15/64" 0.3 ML MISC USE AS DIRECTED TWICE DAILY 200 each 3  . isosorbide mononitrate (IMDUR) 30 MG 24 hr tablet Take 1 tablet (30 mg total) by mouth daily. 90 tablet 1  . lisinopril (ZESTRIL) 10 MG tablet Take 1 tablet (10 mg total) by mouth daily. 90 tablet 1  . metFORMIN (GLUCOPHAGE) 1000 MG tablet Take 1 tablet (1,000 mg total) by mouth 2 (two) times daily with a meal. 180 tablet 1  . metoprolol succinate (TOPROL-XL) 50 MG 24 hr tablet TAKE 1 TABLET BY MOUTH ONCE DAILY WITH  OR  IMMEDIATELY  FOLLOWING  A  MEAL 90 tablet 1  . Syringe, Disposable, 3 ML MISC Use to inject humalog mix 75/25 17u BID 200 each 5   No current facility-administered medications for this visit.      Allergies:   Patient has no known allergies.   Social History:  The patient  reports that he quit smoking about 43 years ago. He has never used smokeless tobacco. He reports that he does not drink alcohol or use drugs.   Family History:   family history includes CAD in his brother and mother; Throat cancer in his father.    Review of Systems: Review of Systems  Constitutional: Negative.   Respiratory: Negative.   Cardiovascular: Negative.   Gastrointestinal: Negative.   Musculoskeletal: Positive for back pain.       Left leg nerve pain  Neurological: Negative.   Psychiatric/Behavioral: Negative.   All other systems reviewed and are negative.    PHYSICAL EXAM: VS:  BP (!) 174/100 (BP Location: Left Arm, Patient Position: Sitting, Cuff Size: Large)   Pulse 90   Ht 5\' 8"  (1.727 m)   Wt (!) 345 lb (156.5 kg)   BMI 52.46 kg/m  , BMI Body mass index is 52.46 kg/m. Repeat BP 138/88 Constitutional:  oriented to person, place, and time. No distress.  HENT:  Head: Grossly normal Eyes:  no discharge. No scleral icterus.  Neck: No JVD, no carotid bruits  Cardiovascular: Regular rate and rhythm, no murmurs appreciated Pulmonary/Chest: Clear to auscultation  bilaterally, no wheezes or rails Abdominal: Soft.  no distension.  no tenderness.  Musculoskeletal: Normal range of motion Neurological:  normal muscle tone. Coordination normal. No atrophy Skin: Skin warm and dry Psychiatric: normal affect, pleasant   Recent Labs: 05/18/2018: ALT 45; BUN 18; Creat 0.96; Hemoglobin 13.1; Platelets 167; Potassium 4.2; Sodium 137; TSH 2.25    Lipid Panel Lab Results  Component Value Date   CHOL 120 05/18/2018   HDL 41 05/18/2018   LDLCALC 65 05/18/2018   TRIG 63 05/18/2018    Wt Readings from Last 3 Encounters:  02/19/19 (!) 345 lb (156.5 kg)  12/18/18 (!) 337 lb (152.9 kg)  05/25/18 (!) 331 lb (150.1 kg)     ASSESSMENT AND PLAN:  Coronary artery disease  of bypass graft of native heart with stable angina pectoris (HCC) - Plan: EKG 12-Lead Stable angina symptoms with very heavy exertion in the heat In the colder weather with no symptoms Discussed the symptoms, if they get worse with less exertion suggested he call our office for further testing  Mixed hyperlipidemia - Plan: EKG 12-Lead Cholesterol is at goal on the current lipid regimen. No changes to the medications were made.  Essential hypertension - Plan: EKG 12-Lead Blood pressure was elevated on arrival but on recheck numbers down to 138 systolic Reports they are well controlled at home  Type 2 diabetes mellitus without complication, with long-term current use of insulin (HCC) - Plan: EKG 12-Lead  hemoglobin A 6.4, and 6.7 L7.2 Recommended low carbohydrate diet .Discussed with him in detail  Morbid obesity (HCC) - Plan: EKG 12-Lead  Strongly recommended he moderate his diet Likes apple pie with ice cream  Disposition:   F/U  12 months   Total encounter time more than 25 minutes  Greater than 50% was spent in counseling and coordination of care with the patient   Orders Placed This Encounter  Procedures  . EKG 12-Lead     Signed, Dossie Arbourim Gollan, M.D., Ph.D. 02/19/2019   [facility] [place] [phone]

## 2019-02-19 ENCOUNTER — Ambulatory Visit (INDEPENDENT_AMBULATORY_CARE_PROVIDER_SITE_OTHER): Payer: Medicaid Other | Admitting: Cardiovascular Disease

## 2019-02-19 ENCOUNTER — Other Ambulatory Visit: Payer: Self-pay

## 2019-02-19 ENCOUNTER — Encounter: Payer: Self-pay | Admitting: Cardiovascular Disease

## 2019-02-19 VITALS — BP 138/88 | HR 90 | Ht 68.0 in | Wt 345.0 lb

## 2019-02-19 DIAGNOSIS — I25118 Atherosclerotic heart disease of native coronary artery with other forms of angina pectoris: Secondary | ICD-10-CM | POA: Diagnosis not present

## 2019-02-19 DIAGNOSIS — E1169 Type 2 diabetes mellitus with other specified complication: Secondary | ICD-10-CM | POA: Diagnosis not present

## 2019-02-19 DIAGNOSIS — I10 Essential (primary) hypertension: Secondary | ICD-10-CM | POA: Diagnosis not present

## 2019-02-19 DIAGNOSIS — E1151 Type 2 diabetes mellitus with diabetic peripheral angiopathy without gangrene: Secondary | ICD-10-CM | POA: Diagnosis not present

## 2019-02-19 DIAGNOSIS — E785 Hyperlipidemia, unspecified: Secondary | ICD-10-CM | POA: Diagnosis not present

## 2019-02-19 NOTE — Patient Instructions (Addendum)
Phone for Dr. Sharlet Salina: for back pain  [phone] [facility]   Medication Instructions:  No changes  If you need a refill on your cardiac medications before your next appointment, please call your pharmacy.    Lab work: No new labs needed   If you have labs (blood work) drawn today and your tests are completely normal, you will receive your results only by: Marland Kitchen MyChart Message (if you have MyChart) OR . A paper copy in the mail If you have any lab test that is abnormal or we need to change your treatment, we will call you to review the results.   Testing/Procedures: No new testing needed   Follow-Up: At [facility], you and your health needs are our priority.  As part of our continuing mission to provide you with exceptional heart care, we have created designated Provider Care Teams.  These Care Teams include your primary Cardiologist (physician) and Advanced Practice Providers (APPs -  Physician Assistants and Nurse Practitioners) who all work together to provide you with the care you need, when you need it.  . You will need a follow up appointment in 12 months .   Please call our office 2 months in advance to schedule this appointment.    . Providers on your designated Care Team:   . Murray Hodgkins, NP . Christell Faith, PA-C . Marrianne Mood, PA-C  Any Other Special Instructions Will Be Listed Below (If Applicable).  For educational health videos Log in to : [email or web address] Or : [email or web address], password : [email or web address]

## 2019-06-15 ENCOUNTER — Other Ambulatory Visit: Payer: Self-pay

## 2019-06-15 ENCOUNTER — Other Ambulatory Visit: Payer: Medicaid Other

## 2019-06-15 DIAGNOSIS — E1169 Type 2 diabetes mellitus with other specified complication: Secondary | ICD-10-CM

## 2019-06-15 DIAGNOSIS — Z0000 Encounter for general adult medical examination without abnormal findings: Secondary | ICD-10-CM | POA: Diagnosis not present

## 2019-06-15 DIAGNOSIS — I25118 Atherosclerotic heart disease of native coronary artery with other forms of angina pectoris: Secondary | ICD-10-CM | POA: Diagnosis not present

## 2019-06-15 DIAGNOSIS — G4733 Obstructive sleep apnea (adult) (pediatric): Secondary | ICD-10-CM

## 2019-06-15 DIAGNOSIS — E1151 Type 2 diabetes mellitus with diabetic peripheral angiopathy without gangrene: Secondary | ICD-10-CM | POA: Diagnosis not present

## 2019-06-15 DIAGNOSIS — I10 Essential (primary) hypertension: Secondary | ICD-10-CM

## 2019-06-15 DIAGNOSIS — Z125 Encounter for screening for malignant neoplasm of prostate: Secondary | ICD-10-CM | POA: Diagnosis not present

## 2019-06-15 DIAGNOSIS — E785 Hyperlipidemia, unspecified: Secondary | ICD-10-CM | POA: Diagnosis not present

## 2019-06-15 DIAGNOSIS — Z6841 Body Mass Index (BMI) 40.0 and over, adult: Secondary | ICD-10-CM

## 2019-06-16 LAB — CBC WITH DIFFERENTIAL/PLATELET
Absolute Monocytes: 381 cells/uL (ref 200–950)
Basophils Absolute: 41 cells/uL (ref 0–200)
Basophils Relative: 1 %
Eosinophils Absolute: 70 cells/uL (ref 15–500)
Eosinophils Relative: 1.7 %
HCT: 42.5 % (ref 38.5–50.0)
Hemoglobin: 13.8 g/dL (ref 13.2–17.1)
Lymphs Abs: 1365 cells/uL (ref 850–3900)
MCH: 27.2 pg (ref 27.0–33.0)
MCHC: 32.5 g/dL (ref 32.0–36.0)
MCV: 83.7 fL (ref 80.0–100.0)
MPV: 11 fL (ref 7.5–12.5)
Monocytes Relative: 9.3 %
Neutro Abs: 2243 cells/uL (ref 1500–7800)
Neutrophils Relative %: 54.7 %
Platelets: 179 10*3/uL (ref 140–400)
RBC: 5.08 10*6/uL (ref 4.20–5.80)
RDW: 14.5 % (ref 11.0–15.0)
Total Lymphocyte: 33.3 %
WBC: 4.1 10*3/uL (ref 3.8–10.8)

## 2019-06-16 LAB — COMPLETE METABOLIC PANEL WITH GFR
AG Ratio: 1.6 (calc) (ref 1.0–2.5)
ALT: 48 U/L — ABNORMAL HIGH (ref 9–46)
AST: 37 U/L — ABNORMAL HIGH (ref 10–35)
Albumin: 4.4 g/dL (ref 3.6–5.1)
Alkaline phosphatase (APISO): 50 U/L (ref 35–144)
BUN: 17 mg/dL (ref 7–25)
CO2: 26 mmol/L (ref 20–32)
Calcium: 9.9 mg/dL (ref 8.6–10.3)
Chloride: 97 mmol/L — ABNORMAL LOW (ref 98–110)
Creat: 1.07 mg/dL (ref 0.70–1.25)
GFR, Est African American: 87 mL/min/{1.73_m2} (ref >=60)
GFR, Est Non African American: 75 mL/min/{1.73_m2} (ref >=60)
Globulin: 2.8 g/dL (calc) (ref 1.9–3.7)
Glucose, Bld: 117 mg/dL — ABNORMAL HIGH (ref 65–99)
Potassium: 4.1 mmol/L (ref 3.5–5.3)
Sodium: 135 mmol/L (ref 135–146)
Total Bilirubin: 0.3 mg/dL (ref 0.2–1.2)
Total Protein: 7.2 g/dL (ref 6.1–8.1)

## 2019-06-16 LAB — LIPID PANEL
Cholesterol: 105 mg/dL (ref <200)
HDL: 36 mg/dL — ABNORMAL LOW (ref >=40)
LDL Cholesterol (Calc): 53 mg/dL (calc)
Non-HDL Cholesterol (Calc): 69 mg/dL (calc) (ref <130)
Total CHOL/HDL Ratio: 2.9 (calc) (ref <5.0)
Triglycerides: 80 mg/dL (ref <150)

## 2019-06-16 LAB — HEMOGLOBIN A1C
Hgb A1c MFr Bld: 7.1 % of total Hgb — ABNORMAL HIGH (ref <5.7)
Mean Plasma Glucose: 157 (calc)
eAG (mmol/L): 8.7 (calc)

## 2019-06-16 LAB — PSA: PSA: 0.2 ng/mL (ref <=4.0)

## 2019-06-20 ENCOUNTER — Encounter: Payer: Self-pay | Admitting: Family Medicine

## 2019-06-20 ENCOUNTER — Other Ambulatory Visit: Payer: Self-pay

## 2019-06-20 ENCOUNTER — Ambulatory Visit (INDEPENDENT_AMBULATORY_CARE_PROVIDER_SITE_OTHER): Payer: Medicaid Other | Admitting: Family Medicine

## 2019-06-20 VITALS — BP 140/82 | HR 68 | Ht 68.0 in | Wt 345.0 lb

## 2019-06-20 DIAGNOSIS — Z1211 Encounter for screening for malignant neoplasm of colon: Secondary | ICD-10-CM

## 2019-06-20 DIAGNOSIS — I10 Essential (primary) hypertension: Secondary | ICD-10-CM | POA: Diagnosis not present

## 2019-06-20 DIAGNOSIS — I25118 Atherosclerotic heart disease of native coronary artery with other forms of angina pectoris: Secondary | ICD-10-CM

## 2019-06-20 DIAGNOSIS — Z0000 Encounter for general adult medical examination without abnormal findings: Secondary | ICD-10-CM

## 2019-06-20 DIAGNOSIS — G4733 Obstructive sleep apnea (adult) (pediatric): Secondary | ICD-10-CM

## 2019-06-20 DIAGNOSIS — Z6841 Body Mass Index (BMI) 40.0 and over, adult: Secondary | ICD-10-CM

## 2019-06-20 DIAGNOSIS — E1169 Type 2 diabetes mellitus with other specified complication: Secondary | ICD-10-CM | POA: Diagnosis not present

## 2019-06-20 DIAGNOSIS — E785 Hyperlipidemia, unspecified: Secondary | ICD-10-CM

## 2019-06-20 DIAGNOSIS — E1151 Type 2 diabetes mellitus with diabetic peripheral angiopathy without gangrene: Secondary | ICD-10-CM | POA: Diagnosis not present

## 2019-06-20 NOTE — Assessment & Plan Note (Signed)
A1c 7.1, stable from previous, overall controlled No episodes of hypoglycemia. Occasional hyperglycemia. Complications - vascular disease including CAD s/p CABG, PAD, among other including hyperlipidemia, morbid obesity, and OSA - increases risk of future cardiovascular complications and poor glucose control  Plan:  1. Continue current therapy - Humalog vials 75/25 mix 17u BID - may adjust minor dose +/- 1-2 unit as needed - Continue Metformin 1000mg  BID - Again reviewed briefly GLP1 medications, review benefits wt loss, A1c control, reduce cardiovascular events, and will consider these in future especially if not improving or worse - he is hesitant on cost still, was unable to link up with [facility] for this when referred, but I gave him Harlow Asa [facility] # and he can try to reach her if he is interested. 2. Encourage improved lifestyle - low carb, low sugar diet, reduce portion size, continue improving regular exercise 3. Check CBG, bring log to next visit for review 4. Continue ASA, ACEi, Statin 5. Due for updated DM Ophtho in 2020 Dr Ellin Mayhew 6. Follow-up 6 months A1c

## 2019-06-20 NOTE — Progress Notes (Signed)
Subjective:    Patient ID: Craig Harvey, male    DOB: August 23, 1955, 63 y.o.   MRN: [id number]  Craig Harvey is a 63 y.o. male presenting on 06/20/2019 for Annual Exam  Virtual / Telehealth Encounter - Telephone  The purpose of this virtual visit is to provide medical care while limiting exposure to the novel coronavirus (COVID19) for both patient and office staff.  Consent was obtained for remote visit:  Yes.   Answered questions that patient had about telehealth interaction:  Yes.   I discussed the limitations, risks, security and privacy concerns of performing an evaluation and management service by video/telephone. I also discussed with the patient that there may be a patient responsible charge related to this service. The patient expressed understanding and agreed to proceed.  Patient Location: Home Provider Location: [facility] (Office)   HPI   Here for Annual Physical and Lab Review  CHRONIC DM, Type 2: Last A1c 7.1. he was unable to connect with Gentry Fitz CCM for med assistance since last apt Meds:Metformin  BID, HumalogVials75/25 - 17u BID wc - Never on GLP or Basal Insulin Reports good compliance. Tolerating well w/o side-effects Currently on ACEi Lifestyle: - Diet (trying to improve diet) Due for DM Eye Exam Dr Clydene Pugh, last 04/2018 - he will schedule. No significant hypoglycemia. Prior 1 or less a month, sometimes minimal, has CBG 90-100 with symptoms, but no significant low hypoglycemia  HYPERLIPIDEMIA / Morbid Obesity BMI >51 - Reports no concerns. Last lipid panel 06/2019, controlled  - Currently taking Atorvastatin , Zetia , tolerating well without side effects or myalgias - weight improved. Mild Elevated LFTs - virtually normal. He has no new concerns or symptoms.  OSA, on CPAP - Patient reports prior history of dx OSA and on CPAP years - Today reports that sleep apnea is well controlled.  uses the CPAP machine every  night. Tolerates the machine well, and thinks that sleeps better with it and feels good. No new concerns or symptoms.   CAD / Vascular Disease / S/p CABG Followed by [facility] Dr Mariah Milling. history of CABG 01/2015 He continues on DAPT with Plavix 75 and ASA 81 He remains chest pain free. Continues on BB, Imdur, Statin, Zetia   Health Maintenance: Due for colonoscopy, he was referred previously to [facility] - but he was unable to schedule this, now was unable to schedule in 2020 due to COVID. - He is interested in Cologuard option.  Prostate CA Screening: Prior PSA / DRE reported normal. Last PSA 0.2 (06/2019). Currently asymptomatic. No known family history of prostate CA.     Depression screen [facility] 2/9 06/20/2019 12/18/2018 05/25/2018  Decreased Interest 0 0 0  Down, Depressed, Hopeless 0 0 0  PHQ - 2 Score 0 0 0  Altered sleeping - - -  Tired, decreased energy - - -  Change in appetite - - -  Feeling bad or failure about yourself  - - -  Trouble concentrating - - -  Moving slowly or fidgety/restless - - -  Suicidal thoughts - - -  PHQ-9 Score - - -  Difficult doing work/chores - - -    Past Medical History:  Diagnosis Date  . CAD (coronary artery disease)    a. treadmill myoview 01/2015: high risk study, lg defect of mod severity present along mid ant, mid anteroseptal, mid anterolateral, apical ant, apical inf, apical lat & apex, c/w ischemia & prior MI w/ peri-infarct ischemia, HTN response;  b. cardiac cath 01/09/2015: ostLAD 70:, mLAD 99%, ost to pLCx 95%, mLCx 80%, LVEF nl, recommend CABGl; c. s/p 3v CABG 01/14/15 LIMA-LAD, SVG-OM, SVG-Ramus  . Hypertension   . Morbid obesity (HCC)   . OSA (obstructive sleep apnea)    a. does not use cpap   Past Surgical History:  Procedure Laterality Date  . CARDIAC CATHETERIZATION N/A 01/09/2015   Procedure: Left Heart Cath and Coronary Angiography;  Surgeon: Antonieta Iba, MD;  Location: [facility] INVASIVE CV LAB;  Service: Cardiovascular;   Laterality: N/A;  . CORONARY ARTERY BYPASS GRAFT N/A 01/14/2015   Procedure: CORONARY ARTERY BYPASS GRAFTING (CABG), ON PUMP, TIMES THREE, USING LEFT INTERNAL MAMMARY ARTERY, RIGHT GREATER SAPHENOUS VEIN HARVESTED ENDOSCOPICALLY;  Surgeon: Kerin Perna, MD;  Location: [facility] OR;  Service: Open Heart Surgery;  Laterality: N/A;  . HERNIA REPAIR    . TEE WITHOUT CARDIOVERSION N/A 01/14/2015   Procedure: TRANSESOPHAGEAL ECHOCARDIOGRAM (TEE);  Surgeon: Kerin Perna, MD;  Location: [facility] OR;  Service: Open Heart Surgery;  Laterality: N/A;   Social History   Socioeconomic History  . Marital status: Single    Spouse name: Not on file  . Number of children: Not on file  . Years of education: Not on file  . Highest education level: Not on file  Occupational History  . Occupation: Former Naval architect / Production designer, theatre/television/film  Tobacco Use  . Smoking status: Former Smoker    Quit date: 08/06/1975    Years since quitting: 43.9  . Smokeless tobacco: Never Used  Substance and Sexual Activity  . Alcohol use: No  . Drug use: No  . Sexual activity: Not on file  Other Topics Concern  . Not on file  Social History Narrative  . Not on file   Social Determinants of Health   Financial Resource Strain:   . Difficulty of Paying Living Expenses: Not on file  Food Insecurity:   . Worried About Programme researcher, broadcasting/film/video in the Last Year: Not on file  . Ran Out of Food in the Last Year: Not on file  Transportation Needs:   . Lack of Transportation (Medical): Not on file  . Lack of Transportation (Non-Medical): Not on file  Physical Activity:   . Days of Exercise per Week: Not on file  . Minutes of Exercise per Session: Not on file  Stress:   . Feeling of Stress : Not on file  Social Connections:   . Frequency of Communication with Friends and Family: Not on file  . Frequency of Social Gatherings with Friends and Family: Not on file  . Attends Religious Services: Not on file  . Active Member of Clubs or  Organizations: Not on file  . Attends Banker Meetings: Not on file  . Marital Status: Not on file  Intimate Partner Violence:   . Fear of Current or Ex-Partner: Not on file  . Emotionally Abused: Not on file  . Physically Abused: Not on file  . Sexually Abused: Not on file   Family History  Problem Relation Age of Onset  . CAD Mother   . CAD Brother   . Throat cancer Father   . Diabetes Neg Hx   . Cancer Neg Hx   . Prostate cancer Neg Hx   . Colon cancer Neg Hx    Current Outpatient Medications on File Prior to Visit  Medication Sig  . aspirin EC 81 MG tablet Take 1 tablet (81 mg total) by mouth daily.  Marland Kitchen  atorvastatin (LIPITOR) 80 MG tablet Take 1 tablet (80 mg total) by mouth daily.  . baclofen (LIORESAL) 10 MG tablet Take 0.5-1 tablets (5-10 mg total) by mouth 2 (two) times daily as needed for muscle spasms (leg pain).  Marland Kitchen. clopidogrel (PLAVIX) 75 MG tablet Take 1 tablet (75 mg total) by mouth daily.  Marland Kitchen. ezetimibe (ZETIA) 10 MG tablet Take 1 tablet (10 mg total) by mouth daily.  . hydrochlorothiazide (HYDRODIURIL) 25 MG tablet Take 1 tablet (25 mg total) by mouth daily.  . insulin lispro protamine-lispro (HUMALOG MIX 75/25) (75-25) 100 UNIT/ML SUSP injection Inject 17 Units into the skin 2 (two) times daily with a meal. Up to max 50 units  . Insulin Syringe-Needle U-100 (BD VEO INSULIN SYRINGE U/F) 31G X 15/64" 0.3 ML MISC USE AS DIRECTED TWICE DAILY  . isosorbide mononitrate (IMDUR) 30 MG 24 hr tablet Take 1 tablet (30 mg total) by mouth daily.  Marland Kitchen. lisinopril (ZESTRIL) 10 MG tablet Take 1 tablet (10 mg total) by mouth daily.  . metFORMIN (GLUCOPHAGE) 1000 MG tablet Take 1 tablet (1,000 mg total) by mouth 2 (two) times daily with a meal.  . metoprolol succinate (TOPROL-XL) 50 MG 24 hr tablet TAKE 1 TABLET BY MOUTH ONCE DAILY WITH  OR  IMMEDIATELY  FOLLOWING  A  MEAL  . Syringe, Disposable, 3 ML MISC Use to inject humalog mix 75/25 17u BID   No current  facility-administered medications on file prior to visit.    Review of Systems Per HPI unless specifically indicated above      Objective:    BP 140/82   Pulse 68   Ht 5\' 8"  (1.727 m)   Wt (!) 345 lb (156.5 kg)   BMI 52.46 kg/m   Wt Readings from Last 3 Encounters:  06/20/19 (!) 345 lb (156.5 kg)  02/19/19 (!) 345 lb (156.5 kg)  12/18/18 (!) 337 lb (152.9 kg)    Physical Exam   No physical exam done. This was telephone virtual visit.  Results for orders placed or performed in visit on 06/15/19  PSA  Result Value Ref Range   PSA 0.2 < OR = 4.0 ng/mL  Lipid panel  Result Value Ref Range   Cholesterol 105 <200 mg/dL   HDL 36 (L) > OR = 40 mg/dL   Triglycerides 80 <161<150 mg/dL   LDL Cholesterol (Calc) 53 mg/dL (calc)   Total CHOL/HDL Ratio 2.9 <5.0 (calc)   Non-HDL Cholesterol (Calc) 69 <096<130 mg/dL (calc)  COMPLETE METABOLIC PANEL WITH GFR  Result Value Ref Range   Glucose, Bld 117 (H) 65 - 99 mg/dL   BUN 17 7 - 25 mg/dL   Creat 0.451.07 4.090.70 - 8.111.25 mg/dL   GFR, Est Non African American 75 > OR = 60 mL/min/1.2873m2   GFR, Est African American 87 > OR = 60 mL/min/1.6773m2   BUN/Creatinine Ratio NOT APPLICABLE 6 - 22 (calc)   Sodium 135 135 - 146 mmol/L   Potassium 4.1 3.5 - 5.3 mmol/L   Chloride 97 (L) 98 - 110 mmol/L   CO2 26 20 - 32 mmol/L   Calcium 9.9 8.6 - 10.3 mg/dL   Total Protein 7.2 6.1 - 8.1 g/dL   Albumin 4.4 3.6 - 5.1 g/dL   Globulin 2.8 1.9 - 3.7 g/dL (calc)   AG Ratio 1.6 1.0 - 2.5 (calc)   Total Bilirubin 0.3 0.2 - 1.2 mg/dL   Alkaline phosphatase (APISO) 50 35 - 144 U/L   AST 37 (H) 10 -  35 U/L   ALT 48 (H) 9 - 46 U/L  CBC with Differential/Platelet  Result Value Ref Range   WBC 4.1 3.8 - 10.8 Thousand/uL   RBC 5.08 4.20 - 5.80 Million/uL   Hemoglobin 13.8 13.2 - 17.1 g/dL   HCT 42.5 38.5 - 50.0 %   MCV 83.7 80.0 - 100.0 fL   MCH 27.2 27.0 - 33.0 pg   MCHC 32.5 32.0 - 36.0 g/dL   RDW 14.5 11.0 - 15.0 %   Platelets 179 140 - 400 Thousand/uL   MPV  11.0 7.5 - 12.5 fL   Neutro Abs 2,243 1,500 - 7,800 cells/uL   Lymphs Abs 1,365 850 - 3,900 cells/uL   Absolute Monocytes 381 200 - 950 cells/uL   Eosinophils Absolute 70 15 - 500 cells/uL   Basophils Absolute 41 0 - 200 cells/uL   Neutrophils Relative % 54.7 %   Total Lymphocyte 33.3 %   Monocytes Relative 9.3 %   Eosinophils Relative 1.7 %   Basophils Relative 1.0 %  Hemoglobin A1c  Result Value Ref Range   Hgb A1c MFr Bld 7.1 (H) <5.7 % of total Hgb   Mean Plasma Glucose 157 (calc)   eAG (mmol/L) 8.7 (calc)      Assessment & Plan:   Problem List Items Addressed This Visit    OSA (obstructive sleep apnea)    Well controlled, chronic OSA on CPAP - Good adherence to CPAP nightly - Continue current CPAP therapy, patient seems to be benefiting from therapy       Morbid obesity with BMI of 50.0-59.9, adult (HCC)    Diet lifestyle exercise Weight loss      Hypertension    Controlled HTN - Home BP readings not available today, reviewed chart  Complication with PAD (CAD, CABG)    Plan:  1. Continue current BP regimen - Lisinopril 10mg  daily, Metoprolol XL 50mg  daily, HCTZ 25mg  daily, Imdur 30mg  daily 2. Encourage improved lifestyle - low sodium diet, regular exercise 3. Start monitor BP outside office, bring readings to next visit, if persistently >140/90 or new symptoms notify office sooner      Hyperlipidemia associated with type 2 diabetes mellitus ([place])    Controlled cholesterol on statin and zetia, with some limited lifestyle Last lipid panel 06/2019 Known CAD/PAD   Plan: 1. Continue current meds - Atorvastatin 80mg  daily, Zetia 10mg  daily 2. Continue ASA 81 and Plavix 75 DAPT for secondary ASCVD risk reduction 3. Encourage improved lifestyle - low carb/cholesterol, reduce portion size, continue improving regular exercise 4. Follow-up yearly lipids      DM (diabetes mellitus), type 2 with peripheral vascular complications (HCC)    T0Z 7.1, stable from  previous, overall controlled No episodes of hypoglycemia. Occasional hyperglycemia. Complications - vascular disease including CAD s/p CABG, PAD, among other including hyperlipidemia, morbid obesity, and OSA - increases risk of future cardiovascular complications and poor glucose control  Plan:  1. Continue current therapy - Humalog vials 75/25 mix 17u BID - may adjust minor dose +/- 1-2 unit as needed - Continue Metformin 1000mg  BID - Again reviewed briefly GLP1 medications, review benefits wt loss, A1c control, reduce cardiovascular events, and will consider these in future especially if not improving or worse - he is hesitant on cost still, was unable to link up with [place] for this when referred, but I gave him [facility]low [facility] # and he can try to reach her if he is interested. 2. Encourage improved lifestyle - low  carb, low sugar diet, reduce portion size, continue improving regular exercise 3. Check CBG, bring log to next visit for review 4. Continue ASA, ACEi, Statin 5. Due for updated DM Ophtho in 2020 Dr Clydene Pugh 6. Follow-up 6 months A1c      CAD (coronary artery disease), native coronary artery    Followed by Cardiology On DAPT, med management       Other Visit Diagnoses    Annual physical exam    -  Primary   Screening for colon cancer       Relevant Orders   [facility] - Cologuard     Updated Health Maintenance information Reviewed recent lab results with patient Encouraged improvement to lifestyle with diet and exercise - Goal of weight loss    No orders of the defined types were placed in this encounter.   Follow up plan: Return in about 6 months (around 12/19/2019) for 6 month follow-up diabetes A1c.   Patient verbalizes understanding with the above medical recommendations including the limitation of remote medical advice.  Specific follow-up and call-back criteria were given for patient to follow-up or seek medical care more urgently if needed.  Total  duration of direct patient care provided via telephone: 12 minutes   Saralyn Pilar, DO [facility] 06/20/2019, 9:20 AM

## 2019-06-20 NOTE — Assessment & Plan Note (Signed)
Controlled HTN - Home BP readings not available today, reviewed chart  Complication with PAD (CAD, CABG)    Plan:  1. Continue current BP regimen - Lisinopril 10mg  daily, Metoprolol XL 50mg  daily, HCTZ 25mg  daily, Imdur 30mg  daily 2. Encourage improved lifestyle - low sodium diet, regular exercise 3. Start monitor BP outside office, bring readings to next visit, if persistently >140/90 or new symptoms notify office sooner

## 2019-06-20 NOTE — Patient Instructions (Addendum)
  Please schedule a Follow-up Appointment to: Return in about 6 months (around 12/19/2019) for 6 month follow-up diabetes A1c.  If you have any other questions or concerns, please feel free to call the office or send a message through [place]. You may also schedule an earlier appointment if necessary.  Additionally, you may be receiving a survey about your experience at our office within a few days to 1 week by e-mail or mail. We value your feedback.  [name], DO [place]

## 2019-06-21 NOTE — Assessment & Plan Note (Signed)
Controlled cholesterol on statin and zetia, with some limited lifestyle Last lipid panel 06/2019 Known CAD/PAD   Plan: 1. Continue current meds - Atorvastatin 80mg daily, Zetia 10mg daily 2. Continue ASA 81 and Plavix 75 DAPT for secondary ASCVD risk reduction 3. Encourage improved lifestyle - low carb/cholesterol, reduce portion size, continue improving regular exercise 4. Follow-up yearly lipids 

## 2019-06-21 NOTE — Assessment & Plan Note (Signed)
Well controlled, chronic OSA on CPAP - Good adherence to CPAP nightly - Continue current CPAP therapy, patient seems to be benefiting from therapy  

## 2019-06-21 NOTE — Assessment & Plan Note (Signed)
Diet lifestyle exercise Weight loss

## 2019-06-21 NOTE — Assessment & Plan Note (Signed)
Followed by Cardiology On DAPT, med management 

## 2019-06-25 ENCOUNTER — Other Ambulatory Visit: Payer: Self-pay | Admitting: Family Medicine

## 2019-06-25 DIAGNOSIS — E785 Hyperlipidemia, unspecified: Secondary | ICD-10-CM

## 2019-06-25 DIAGNOSIS — E1169 Type 2 diabetes mellitus with other specified complication: Secondary | ICD-10-CM

## 2019-06-26 DIAGNOSIS — Z23 Encounter for immunization: Secondary | ICD-10-CM | POA: Diagnosis not present

## 2019-07-02 DIAGNOSIS — Z1212 Encounter for screening for malignant neoplasm of rectum: Secondary | ICD-10-CM | POA: Diagnosis not present

## 2019-07-02 DIAGNOSIS — Z1211 Encounter for screening for malignant neoplasm of colon: Secondary | ICD-10-CM | POA: Diagnosis not present

## 2019-07-07 LAB — COLOGUARD: Cologuard: NEGATIVE

## 2019-07-10 ENCOUNTER — Other Ambulatory Visit: Payer: Self-pay

## 2019-07-10 DIAGNOSIS — E1151 Type 2 diabetes mellitus with diabetic peripheral angiopathy without gangrene: Secondary | ICD-10-CM

## 2019-07-10 MED ORDER — ACCU-CHEK AVIVA PLUS VI STRP: ORAL_STRIP | 12 refills | Status: DC

## 2019-07-10 NOTE — Telephone Encounter (Signed)
The pt was notified of his negative cologuard result. He verbalize understanding.

## 2019-08-14 ENCOUNTER — Other Ambulatory Visit: Payer: Self-pay | Admitting: Family Medicine

## 2019-08-14 DIAGNOSIS — E785 Hyperlipidemia, unspecified: Secondary | ICD-10-CM

## 2019-08-14 DIAGNOSIS — E1169 Type 2 diabetes mellitus with other specified complication: Secondary | ICD-10-CM

## 2019-08-14 DIAGNOSIS — I10 Essential (primary) hypertension: Secondary | ICD-10-CM

## 2019-08-14 DIAGNOSIS — E1159 Type 2 diabetes mellitus with other circulatory complications: Secondary | ICD-10-CM

## 2019-08-14 DIAGNOSIS — I2510 Atherosclerotic heart disease of native coronary artery without angina pectoris: Secondary | ICD-10-CM

## 2019-08-14 DIAGNOSIS — R0789 Other chest pain: Secondary | ICD-10-CM

## 2019-08-15 ENCOUNTER — Other Ambulatory Visit: Payer: Self-pay

## 2019-08-15 DIAGNOSIS — E1169 Type 2 diabetes mellitus with other specified complication: Secondary | ICD-10-CM

## 2019-08-15 DIAGNOSIS — I2510 Atherosclerotic heart disease of native coronary artery without angina pectoris: Secondary | ICD-10-CM

## 2019-08-15 DIAGNOSIS — R0789 Other chest pain: Secondary | ICD-10-CM

## 2019-08-15 DIAGNOSIS — E785 Hyperlipidemia, unspecified: Secondary | ICD-10-CM

## 2019-08-15 DIAGNOSIS — E1159 Type 2 diabetes mellitus with other circulatory complications: Secondary | ICD-10-CM

## 2019-08-15 DIAGNOSIS — I10 Essential (primary) hypertension: Secondary | ICD-10-CM

## 2019-08-16 MED ORDER — ISOSORBIDE MONONITRATE ER 30 MG PO TB24: 30.0000 mg | ORAL_TABLET | Freq: Every day | ORAL | 1 refills | Status: DC

## 2019-08-16 MED ORDER — CLOPIDOGREL BISULFATE 75 MG PO TABS: 75.0000 mg | ORAL_TABLET | Freq: Every day | ORAL | 3 refills | Status: DC

## 2019-08-16 MED ORDER — METFORMIN HCL 1000 MG PO TABS: ORAL_TABLET | ORAL | 1 refills | Status: DC

## 2019-08-16 MED ORDER — EZETIMIBE 10 MG PO TABS: 10.0000 mg | ORAL_TABLET | Freq: Every day | ORAL | 1 refills | Status: DC

## 2019-08-16 MED ORDER — LISINOPRIL 10 MG PO TABS: 10.0000 mg | ORAL_TABLET | Freq: Every day | ORAL | 1 refills | Status: DC

## 2019-10-18 ENCOUNTER — Other Ambulatory Visit: Payer: Self-pay | Admitting: Family Medicine

## 2019-10-18 DIAGNOSIS — E1151 Type 2 diabetes mellitus with diabetic peripheral angiopathy without gangrene: Secondary | ICD-10-CM

## 2019-10-18 NOTE — Telephone Encounter (Signed)
Requested Prescriptions  Pending Prescriptions Disp Refills  . HUMALOG MIX 75/25 (75-25) 100 UNIT/ML SUSP injection [Pharmacy Med Name: HumaLOG Mix 75/25 (75-25) 100 UNIT/ML Subcutaneous Suspension] 10 mL 0    Sig: INJECT 17 UNITS SUBCUTANEOUSLY TWICE DAILY WITH MEALS UP  TO  MAX  50  UNITS     Endocrinology:  Diabetes - Insulins Failed - 10/18/2019 12:27 PM      Failed - Valid encounter within last 6 months    Recent Outpatient Visits          4 months ago Annual physical exam   [facility] [place], Netta Neat, DO   10 months ago DM (diabetes mellitus), type 2 with peripheral vascular complications [facility])   [facility] Smitty Cords, DO   1 year ago Annual physical exam   [facility] Smitty Cords, DO   1 year ago DM (diabetes mellitus), type 2 with peripheral vascular complications [facility])   [facility] Smitty Cords, DO   1 year ago CAD, multiple vessel   [facility] [employer], Netta Neat, DO      Future Appointments            In 2 months Althea Charon, Netta Neat, DO [facility], [facility]           Passed - HBA1C is between 0 and 7.9 and within 180 days    Hemoglobin A1C  Date Value Ref Range Status  04/18/2017 6.4  Final   Hgb A1c MFr Bld  Date Value Ref Range Status  06/15/2019 7.1 (H) <5.7 % of total Hgb Final    Comment:    For someone without known diabetes, a hemoglobin A1c value of 6.5% or greater indicates that they may have  diabetes and this should be confirmed with a follow-up  test. . For someone with known diabetes, a value <7% indicates  that their diabetes is well controlled and a value  greater than or equal to 7% indicates suboptimal  control. A1c targets should be individualized based on  duration of diabetes, age, comorbid conditions, and  other considerations. . Currently, no consensus exists regarding use  of hemoglobin A1c for diagnosis of diabetes for children. Marland Kitchen

## 2019-11-08 ENCOUNTER — Other Ambulatory Visit: Payer: Self-pay | Admitting: Family Medicine

## 2019-11-08 DIAGNOSIS — I2510 Atherosclerotic heart disease of native coronary artery without angina pectoris: Secondary | ICD-10-CM

## 2019-11-08 DIAGNOSIS — I10 Essential (primary) hypertension: Secondary | ICD-10-CM

## 2019-11-08 DIAGNOSIS — E1151 Type 2 diabetes mellitus with diabetic peripheral angiopathy without gangrene: Secondary | ICD-10-CM

## 2019-11-08 NOTE — Telephone Encounter (Signed)
Requested medication (s) are due for refill today: Humalog is early  Requested medication (s) are on the active medication list: Yes  Last refill:  3 weeks ago  Future visit scheduled: Yes  Notes to clinic:  Hydrodiuril and Toprol will expire 11/14/19.    Requested Prescriptions  Pending Prescriptions Disp Refills   HUMALOG MIX 75/25 (75-25) 100 UNIT/ML SUSP injection [Pharmacy Med Name: HumaLOG Mix 75/25 (75-25) 100 UNIT/ML Subcutaneous Suspension] 10 mL 0    Sig: INJECT 17 UNITS SUBCUTANEOUSLY TWICE DAILY WITH MEALS UP  TO  MAX  50  UNITS      Endocrinology:  Diabetes - Insulins Failed - 11/08/2019 12:28 PM      Failed - Valid encounter within last 6 months    Recent Outpatient Visits           4 months ago Annual physical exam   [facility] [place], Netta Neat, DO   10 months ago DM (diabetes mellitus), type 2 with peripheral vascular complications [facility])   [facility] Smitty Cords, DO   1 year ago Annual physical exam   [facility] Smitty Cords, DO   1 year ago DM (diabetes mellitus), type 2 with peripheral vascular complications [facility])   [facility] Smitty Cords, DO   2 years ago CAD, multiple vessel   [facility] [place], Netta Neat, DO       Future Appointments             In 1 month Althea Charon, Netta Neat, DO [facility], [facility]            Passed - HBA1C is between 0 and 7.9 and within 180 days    Hemoglobin A1C  Date Value Ref Range Status  04/18/2017 6.4  Final   Hgb A1c MFr Bld  Date Value Ref Range Status  06/15/2019 7.1 (H) <5.7 % of total Hgb Final    Comment:    For someone without known diabetes, a hemoglobin A1c value of 6.5% or greater indicates that they may have  diabetes and this should be confirmed with a follow-up  test. . For someone with known diabetes, a value <7% indicates  that their diabetes is  well controlled and a value  greater than or equal to 7% indicates suboptimal  control. A1c targets should be individualized based on  duration of diabetes, age, comorbid conditions, and  other considerations. . Currently, no consensus exists regarding use of hemoglobin A1c for diagnosis of diabetes for children. .             hydrochlorothiazide (HYDRODIURIL) 25 MG tablet [Pharmacy Med Name: hydroCHLOROthiazide 25 MG Oral Tablet] 90 tablet 0    Sig: Take 1 tablet by mouth once daily      Cardiovascular: Diuretics - Thiazide Failed - 11/08/2019 12:28 PM      Failed - Last BP in normal range    BP Readings from Last 1 Encounters:  06/20/19 140/82          Failed - Valid encounter within last 6 months    Recent Outpatient Visits           4 months ago Annual physical exam   [facility] Smitty Cords, DO   10 months ago DM (diabetes mellitus), type 2 with peripheral vascular complications [facility])   [facility] Smitty Cords, DO   1 year ago Annual  physical exam   [place], DO   1 year ago DM (diabetes mellitus), type 2 with peripheral vascular complications [facility])   [facility] [name], DO   2 years ago CAD, multiple vessel   [place], DO       Future Appointments             In 1 month [place], Devonne Doughty, DO [facility], [place] in normal range and within 360 days    Calcium  Date Value Ref Range Status  06/15/2019 9.9 8.6 - 10.3 mg/dL Final  12/20/2016 9.5  Final   Calcium, Ion  Date Value Ref Range Status  01/15/2015 1.17 1.12 - 1.23 mmol/L Final          Passed - Cr in normal range and within 360 days    Creat  Date Value Ref Range Status  06/15/2019 1.07 0.70 - 1.25 mg/dL Final    Comment:    For patients >102 years of age, the reference  limit for Creatinine is approximately 13% higher for people identified as African-American. .           Passed - K in normal range and within 360 days    Potassium  Date Value Ref Range Status  06/15/2019 4.1 3.5 - 5.3 mmol/L Final          Passed - Na in normal range and within 360 days    Sodium  Date Value Ref Range Status  06/15/2019 135 135 - 146 mmol/L Final  04/18/2017 140 137 - 147 Final            metoprolol succinate (TOPROL-XL) 50 MG 24 hr tablet [Pharmacy Med Name: Metoprolol Succinate ER 50 MG Oral Tablet Extended Release 24 Hour] 90 tablet 0    Sig: TAKE 1 TABLET BY MOUTH ONCE DAILY. TAKE WITH OR IMMEDIATELY FOLLOWING A MEAL.      Cardiovascular:  Beta Blockers Failed - 11/08/2019 12:28 PM      Failed - Last BP in normal range    BP Readings from Last 1 Encounters:  06/20/19 140/82          Failed - Valid encounter within last 6 months    Recent Outpatient Visits           4 months ago Annual physical exam   [place], DO   10 months ago DM (diabetes mellitus), type 2 with peripheral vascular complications [facility])   [place], DO   1 year ago Annual physical exam   [place], DO   1 year ago DM (diabetes mellitus), type 2 with peripheral vascular complications [facility])   [facility] [name], DO   2 years ago CAD, multiple vessel   [place], DO       Future Appointments             In 1 month [place], Devonne Doughty, [facility], [place] in normal range    Pulse Readings from Last 1 Encounters:  06/20/19 68

## 2019-12-06 ENCOUNTER — Other Ambulatory Visit: Payer: Self-pay | Admitting: Family Medicine

## 2019-12-06 DIAGNOSIS — E1151 Type 2 diabetes mellitus with diabetic peripheral angiopathy without gangrene: Secondary | ICD-10-CM

## 2019-12-06 NOTE — Telephone Encounter (Signed)
Appointment 6/30- RF granted per protocol

## 2020-01-02 ENCOUNTER — Ambulatory Visit: Payer: Medicaid Other | Admitting: Family Medicine

## 2020-01-09 ENCOUNTER — Other Ambulatory Visit: Payer: Self-pay

## 2020-01-09 ENCOUNTER — Encounter: Payer: Self-pay | Admitting: Family Medicine

## 2020-01-09 ENCOUNTER — Ambulatory Visit (INDEPENDENT_AMBULATORY_CARE_PROVIDER_SITE_OTHER): Payer: Medicaid Other | Admitting: Family Medicine

## 2020-01-09 ENCOUNTER — Other Ambulatory Visit: Payer: Self-pay | Admitting: Family Medicine

## 2020-01-09 VITALS — BP 138/87 | HR 90 | Temp 97.1°F | Resp 16 | Ht 68.0 in | Wt 327.6 lb

## 2020-01-09 DIAGNOSIS — Z6841 Body Mass Index (BMI) 40.0 and over, adult: Secondary | ICD-10-CM

## 2020-01-09 DIAGNOSIS — R252 Cramp and spasm: Secondary | ICD-10-CM | POA: Diagnosis not present

## 2020-01-09 DIAGNOSIS — E785 Hyperlipidemia, unspecified: Secondary | ICD-10-CM | POA: Diagnosis not present

## 2020-01-09 DIAGNOSIS — Z9989 Dependence on other enabling machines and devices: Secondary | ICD-10-CM

## 2020-01-09 DIAGNOSIS — I25118 Atherosclerotic heart disease of native coronary artery with other forms of angina pectoris: Secondary | ICD-10-CM

## 2020-01-09 DIAGNOSIS — E1151 Type 2 diabetes mellitus with diabetic peripheral angiopathy without gangrene: Secondary | ICD-10-CM

## 2020-01-09 DIAGNOSIS — G4733 Obstructive sleep apnea (adult) (pediatric): Secondary | ICD-10-CM | POA: Diagnosis not present

## 2020-01-09 DIAGNOSIS — E1169 Type 2 diabetes mellitus with other specified complication: Secondary | ICD-10-CM

## 2020-01-09 LAB — POCT GLYCOSYLATED HEMOGLOBIN (HGB A1C): Hemoglobin A1C: 6.4 % — AB (ref 4.0–5.6)

## 2020-01-09 MED ORDER — TRULICITY 0.75 MG/0.5ML ~~LOC~~ SOAJ: 0.7500 mg | SUBCUTANEOUS | 2 refills | Status: DC

## 2020-01-09 NOTE — Assessment & Plan Note (Signed)
Well controlled, chronic OSA on CPAP - Good adherence to CPAP nightly - Continue current CPAP therapy, patient seems to be benefiting from therapy  He may benefit from future repeat CPAP titration/sleep study as indicated by insurance for future medical supply orders. Last sleep study >10 year ago approx 

## 2020-01-09 NOTE — Assessment & Plan Note (Addendum)
Improved A1c to 6.4, from prior 7 range No episodes of hypoglycemia. Occasional hyperglycemia. Complications - vascular disease including CAD s/p CABG, PAD, among other including hyperlipidemia, morbid obesity, and OSA - increases risk of future cardiovascular complications and poor glucose control  Plan:  1. START NEW Therapy GLP1 - Trulicity 0.75mg  weekly inj - reviewed benefit risk side effect, dosing, showed demo pen but no dose given today, no samples in clinic, new rx ordered, should be covered on Medicaid now. - REDUCE Humalog vials 75/25 mix down by 3-5 units each dose starting day after first Trulicity injection, can reduce to 12-14u BID for now then titrate down by 1 unit BID if fasting CBG < 150 consistently over 3-5 days - goal to eventually come off insulin and increase trulicity - Continue Metformin 1000mg  BID 2. Encourage improved lifestyle - low carb, low sugar diet, reduce portion size, continue improving regular exercise 3. Check CBG, bring log to next visit for review 4. Continue ASA, ACEi, Statin - DM Foot exam 5. Given list for DM Eye exam - he can schedule with office accepting medicaid  3 mo A1c

## 2020-01-09 NOTE — Assessment & Plan Note (Signed)
Controlled cholesterol on statin and zetia, with some limited lifestyle Last lipid panel 06/2019 Known CAD/PAD   Plan: 1. Continue current meds - Atorvastatin 80mg  daily, Zetia 10mg  daily 2. Continue ASA 81 and Plavix 75 DAPT for secondary ASCVD risk reduction 3. Encourage improved lifestyle - low carb/cholesterol, reduce portion size, continue improving regular exercise 4. Follow-up yearly lipids

## 2020-01-09 NOTE — Progress Notes (Signed)
Subjective:    Patient ID: Craig Harvey, male    DOB: [birth year], 64 y.o.   MRN: [id number]  Craig Harvey is a 64 y.o. male presenting on 01/09/2020 for Diabetes   HPI   CHRONIC DM, Type 2: Previous A1c 7-8 in past. Today due for A1c. he was unable to connect with [facility] for med assistance since last apt. Meds:Metformin 1000mg  BID, HumalogVials75/25 - 17u BID wc - Never on GLP or Basal Insulin CBG avg 120-150s Reports good compliance. Tolerating well w/o side-effects Currently on ACEi Lifestyle: - Diet (trying to improve diet) Due for DM Eye Exam Dr , last 04/2018 - he will schedule with OTHER office, as they no longer take medicaid No significant hypoglycemia. Prior 1 or less a month, sometimes minimal, has CBG 90-100 with symptoms, but no significant low hypoglycemia  HYPERLIPIDEMIA/ Morbid Obesity BMI >49 - Reports no concerns. Last lipid panel12/2020, controlled  - Currently takingAtorvastatin 80mg , Zetia 10mg , tolerating well without side effects or myalgias - weight improved. Mild Elevated LFTs - virtually normal. He has no new concerns or symptoms.  OSA, on CPAP - Patient reports prior history of dx OSA and on CPAP years. Last sleep study >10 year ago - Today reports that sleep apnea is well controlled.  uses the CPAP machine every night. Tolerates the machine well, and thinks that sleeps better with it and feels good. No new concerns or symptoms.  CAD / Vascular Disease/ S/p CABG Followed by [facility] Cardiology Dr . history of CABG 01/2015 He continues on DAPT with Plavix 75 and ASA 81 He remains chest pain free. Continues on BB, Imdur, Statin, Zetia    Health Maintenance: Cologuard negative 07/07/19, next due 07/2022  Prostate CA Screening: Prior PSA / DRE reported normal. Last PSA 0.2 (06/2019). Currently asymptomatic. No known family history of prostate CA.    Depression screen [facility] 2/9 01/09/2020 06/20/2019 12/18/2018  Decreased  Interest 0 0 0  Down, Depressed, Hopeless 0 0 0  PHQ - 2 Score 0 0 0  Altered sleeping - - -  Tired, decreased energy - - -  Change in appetite - - -  Feeling bad or failure about yourself  - - -  Trouble concentrating - - -  Moving slowly or fidgety/restless - - -  Suicidal thoughts - - -  PHQ-9 Score - - -  Difficult doing work/chores - - -    Social History   Tobacco Use  . Smoking status: Former Smoker    Quit date: 08/06/1975    Years since quitting: 44.4  . Smokeless tobacco: Never Used  Vaping Use  . Vaping Use: Never used  Substance Use Topics  . Alcohol use: No  . Drug use: No    Review of Systems Per HPI unless specifically indicated above     Objective:    BP 138/87   Pulse 90   Temp (!) 97.1 F (36.2 C) (Temporal)   Resp 16   Ht 5\' 8"  (1.727 m)   Wt (!) 327 lb 9.6 oz (148.6 kg)   SpO2 99%   BMI 49.81 kg/m   Wt Readings from Last 3 Encounters:  01/09/20 (!) 327 lb 9.6 oz (148.6 kg)  06/20/19 (!) 345 lb (156.5 kg)  02/19/19 (!) 345 lb (156.5 kg)    Physical Exam Vitals and nursing note reviewed.  Constitutional:      General: He is not in acute distress.    Appearance: He is well-developed. He  is obese. He is not diaphoretic.     Comments: Well-appearing, comfortable, cooperative  HENT:     Head: Normocephalic and atraumatic.  Eyes:     General:        Right eye: No discharge.        Left eye: No discharge.     Conjunctiva/sclera: Conjunctivae normal.  Neck:     Thyroid: No thyromegaly.  Cardiovascular:     Rate and Rhythm: Normal rate and regular rhythm.     Heart sounds: Normal heart sounds. No murmur heard.   Pulmonary:     Effort: Pulmonary effort is normal. No respiratory distress.     Breath sounds: Normal breath sounds. No wheezing or rales.  Chest:     Chest wall: No tenderness (no reproducible symptoms).  Musculoskeletal:        General: Normal range of motion.     Cervical back: Normal range of motion and neck supple.    Lymphadenopathy:     Cervical: No cervical adenopathy.  Skin:    General: Skin is warm and dry.     Findings: No erythema or rash.  Neurological:     Mental Status: He is alert and oriented to person, place, and time.  Psychiatric:        Behavior: Behavior normal.     Comments: Well groomed, good eye contact, normal speech and thoughts      Diabetic Foot Exam - Simple   Simple Foot Form Diabetic Foot exam was performed with the following findings: Yes 01/09/2020  1:53 PM  Visual Inspection No deformities, no ulcerations, no other skin breakdown bilaterally: Yes Sensation Testing Intact to touch and monofilament testing bilaterally: Yes Pulse Check Posterior Tibialis and Dorsalis pulse intact bilaterally: Yes Comments    Recent Labs    06/15/19 0756 01/09/20 1402  HGBA1C 7.1* 6.4*     Results for orders placed or performed in visit on 01/09/20  POCT HgB A1C  Result Value Ref Range   Hemoglobin A1C 6.4 (A) 4.0 - 5.6 %      Assessment & Plan:   Problem List Items Addressed This Visit    OSA on CPAP    Well controlled, chronic OSA on CPAP - Good adherence to CPAP nightly - Continue current CPAP therapy, patient seems to be benefiting from therapy  He may benefit from future repeat CPAP titration/sleep study as indicated by insurance for future medical supply orders. Last sleep study >10 year ago approx      Morbid obesity with BMI of 45.0-49.9, adult (HCC)    Weight down about 20 lbs in 7 months Encourage continued lifestyle diet exercise Now start GLP1 reduce insulin should help weight      Relevant Medications   TRULICITY 0.75 MG/0.5ML SOPN   Leg cramping   Hyperlipidemia associated with type 2 diabetes mellitus (HCC)    Controlled cholesterol on statin and zetia, with some limited lifestyle Last lipid panel 06/2019 Known CAD/PAD   Plan: 1. Continue current meds - Atorvastatin 80mg  daily, Zetia 10mg  daily 2. Continue ASA 81 and Plavix 75 DAPT for  secondary ASCVD risk reduction 3. Encourage improved lifestyle - low carb/cholesterol, reduce portion size, continue improving regular exercise 4. Follow-up yearly lipids      Relevant Medications   TRULICITY 0.75 MG/0.5ML SOPN   DM (diabetes mellitus), type 2 with peripheral vascular complications (HCC) - Primary    Improved A1c to 6.4, from prior 7 range No episodes of hypoglycemia. Occasional hyperglycemia. Complications -  vascular disease including CAD s/p CABG, PAD, among other including hyperlipidemia, morbid obesity, and OSA - increases risk of future cardiovascular complications and poor glucose control  Plan:  1. START NEW Therapy GLP1 - Trulicity 0.75mg  weekly inj - reviewed benefit risk side effect, dosing, showed demo pen but no dose given today, no samples in clinic, new rx ordered, should be covered on Medicaid now. - REDUCE Humalog vials 75/25 mix down by 3-5 units each dose starting day after first Trulicity injection, can reduce to 12-14u BID for now then titrate down by 1 unit BID if fasting CBG < 150 consistently over 3-5 days - goal to eventually come off insulin and increase trulicity - Continue Metformin 1000mg  BID 2. Encourage improved lifestyle - low carb, low sugar diet, reduce portion size, continue improving regular exercise 3. Check CBG, bring log to next visit for review 4. Continue ASA, ACEi, Statin - DM Foot exam 5. Given list for DM Eye exam - he can schedule with office accepting medicaid  3 mo A1c      Relevant Medications   TRULICITY 0.75 MG/0.5ML SOPN   Other Relevant Orders   POCT HgB A1C (Completed)   CAD (coronary artery disease), native coronary artery    Followed by Cardiology On DAPT, med management  Advised current episodic sharp pains R sided unlikely related, likely MSK - he has upcoming apt with Cardiology soon, advised if he is requesting imaging such as CXR we can consider this if needed.         Meds ordered this encounter    Medications  . TRULICITY 0.75 MG/0.5ML SOPN    Sig: Inject 0.5 mLs (0.75 mg total) into the skin once a week.    Dispense:  2 mL    Refill:  2   Future may order chest x-ray for atypical R sided episodic pain / breathing if needed   Follow up plan: Return in about 3 months (around 04/10/2020) for 3 month DM A1c, med adjust on Trulicity.   06/10/2020, DO [facility] 01/09/2020, 1:45 PM

## 2020-01-09 NOTE — Patient Instructions (Addendum)
Thank you for coming to the office today.  Also talk to Dr Mariah Milling or Heart doctors - and if they are not ordering any other testing, we can order a Chest X-ray if you are interested just let me know.  Check with Medicaid for sleep study providers / company  [facility] ([place] in [place]  [email or web address]  Start Trulicity 0.75mg  once a week. Sample pens given. New rx sent, should be covered on Medicaid.  Keep this dose for 3 months. Then we can increase if working.  Go ahead and reduce your insulin by about 3 to 5 units each dose, so approximately 12-14 units twice a day to get started, then if you notice your sugars fasting in morning consistently < 150 keep reducing insulin by 1 unit every 3 to 5 days (on each dose)   Your provider would like to you have your annual eye exam. Please contact your current eye doctor or here are some good options for you to contact.    [facility] [address], [place], [place] [address] Phone: [phone] [email or web address]  [facility]  Address: [address] [place], [place], [place] [address] Phone: [phone]   [facility] [address] [place], [place] [address] Phone: [phone]  [facility] Address: [address] [place], [place], [place] [address]  Phone: [phone]  [facility] Address: [address] [place], [place], [place] [address] Phone: [phone]   Please schedule a Follow-up Appointment to: Return in about 3 months (around 04/10/2020) for 3 month DM A1c, med adjust on Trulicity.  If you have any other questions or concerns, please feel free to call the office or send a message through MyChart. You may also schedule an earlier appointment if necessary.  Additionally, you may be receiving a survey about your experience at our office within a few days to 1 week by e-mail or mail. We value your feedback.  Saralyn Pilar, DO [facility], [place]

## 2020-01-09 NOTE — Assessment & Plan Note (Signed)
Weight down about 20 lbs in 7 months Encourage continued lifestyle diet exercise Now start GLP1 reduce insulin should help weight

## 2020-01-09 NOTE — Assessment & Plan Note (Addendum)
Followed by Cardiology On DAPT, med management  Advised current episodic sharp pains R sided unlikely related, likely MSK - he has upcoming apt with Cardiology soon, advised if he is requesting imaging such as CXR we can consider this if needed.

## 2020-02-06 ENCOUNTER — Other Ambulatory Visit: Payer: Self-pay | Admitting: Family Medicine

## 2020-02-06 DIAGNOSIS — I2510 Atherosclerotic heart disease of native coronary artery without angina pectoris: Secondary | ICD-10-CM

## 2020-02-06 DIAGNOSIS — I10 Essential (primary) hypertension: Secondary | ICD-10-CM

## 2020-02-06 DIAGNOSIS — E785 Hyperlipidemia, unspecified: Secondary | ICD-10-CM

## 2020-02-06 DIAGNOSIS — E1159 Type 2 diabetes mellitus with other circulatory complications: Secondary | ICD-10-CM

## 2020-02-06 DIAGNOSIS — E1151 Type 2 diabetes mellitus with diabetic peripheral angiopathy without gangrene: Secondary | ICD-10-CM

## 2020-02-20 ENCOUNTER — Encounter: Payer: Self-pay | Admitting: Nurse Practitioner

## 2020-02-20 ENCOUNTER — Other Ambulatory Visit: Payer: Self-pay

## 2020-02-20 ENCOUNTER — Ambulatory Visit (INDEPENDENT_AMBULATORY_CARE_PROVIDER_SITE_OTHER): Payer: Medicaid Other | Admitting: Nurse Practitioner

## 2020-02-20 VITALS — BP 122/76 | HR 96 | Ht 68.0 in | Wt 323.4 lb

## 2020-02-20 DIAGNOSIS — E785 Hyperlipidemia, unspecified: Secondary | ICD-10-CM | POA: Diagnosis not present

## 2020-02-20 DIAGNOSIS — I2510 Atherosclerotic heart disease of native coronary artery without angina pectoris: Secondary | ICD-10-CM

## 2020-02-20 DIAGNOSIS — I10 Essential (primary) hypertension: Secondary | ICD-10-CM | POA: Diagnosis not present

## 2020-02-20 DIAGNOSIS — G4733 Obstructive sleep apnea (adult) (pediatric): Secondary | ICD-10-CM | POA: Diagnosis not present

## 2020-02-20 NOTE — Progress Notes (Signed)
Office Visit    Patient Name: Craig Harvey Date of Encounter: 02/20/2020  Primary Care Provider:  Smitty Cords, DO Primary Cardiologist:  Julien Nordmann, MD  Chief Complaint    64 year old male with a history of CAD status post three-vessel bypass in July 2016, hypertension, hyperlipidemia, obesity, sleep apnea, and diabetes, who presents for follow-up of CAD.  Past Medical History    Past Medical History:  Diagnosis Date  . CAD (coronary artery disease)    a. 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 cardiac cath 01/09/2015: ostLAD 70:, mLAD 99%, ost to pLCx 95%, mLCx 80%, LVEF nl, recommend CABGl; c. s/p 3v CABG 01/14/15 LIMA-LAD, SVG-OM, SVG-Ramus  . History of echocardiogram    a. 01/2015 Echo: EF 60-65%, no rwma, nl RV fxn. Nl PASP.  Marland Kitchen Hyperlipidemia LDL goal <70   . Hypertension   . Morbid obesity (HCC)   . OSA (obstructive sleep apnea)   . Type II diabetes mellitus (HCC)    Past Surgical History:  Procedure Laterality Date  . CARDIAC CATHETERIZATION N/A 01/09/2015   Procedure: Left Heart Cath and Coronary Angiography;  Surgeon: Antonieta Iba, MD;  Location: [facility] INVASIVE CV LAB;  Service: Cardiovascular;  Laterality: N/A;  . CORONARY ARTERY BYPASS GRAFT N/A 01/14/2015   Procedure: CORONARY ARTERY BYPASS GRAFTING (CABG), ON PUMP, TIMES THREE, USING LEFT INTERNAL MAMMARY ARTERY, RIGHT GREATER SAPHENOUS VEIN HARVESTED ENDOSCOPICALLY;  Surgeon: Kerin Perna, MD;  Location: [facility] OR;  Service: Open Heart Surgery;  Laterality: N/A;  . HERNIA REPAIR    . TEE WITHOUT CARDIOVERSION N/A 01/14/2015   Procedure: TRANSESOPHAGEAL ECHOCARDIOGRAM (TEE);  Surgeon: Kerin Perna, MD;  [facility];  Service: Open Heart Surgery;  Laterality: N/A;    Allergies  No Known  Allergies  History of Present Illness    64 year old male with above complex past medical history including coronary artery disease status post three-vessel bypass in July 2016, hypertension, hyperlipidemia, obesity, sleep apnea, and diabetes.  Last echocardiogram in July 2016 showed an EF of 60-65% without regional wall motion abnormalities.  He was last seen in cardiology clinic in 02/2019, at which time he was doing well.  Since then, he notes that he has been stable.  He has been riding his exercise bike for 20 minutes daily and also being more careful with what he eats and is down 22 pounds since December 2020.  He says that ever since his bypass surgery, he has had a mild tightness in the center of his chest that occurs with standing or certain activities.  He always attributed this to the surgical incision.  He will also sometimes note this when riding his bike for greater than 20 minutes or when walking outside in the heat.  He does not think that the frequency or severity of this symptom has worsened (3/10) since his surgery.  Previous anginal equivalent was left lateral chest pain, which she has not experienced since his surgery.  He continues to experience left calf cramping despite being prescribed baclofen.  This occurs randomly at both rest and with activity and seems to improve with certain position changes.  He denies dyspnea, palpitations, PND, orthopnea, dizziness, syncope, edema, or early satiety.  Home Medications    Prior to Admission medications   Medication Sig Start Date  End Date Taking? Authorizing Provider  aspirin EC 81 MG tablet Take 1 tablet (81 mg total) by mouth daily. 11/18/17   Antonieta Iba, MD  atorvastatin (LIPITOR) 80 MG tablet Take 1 tablet by mouth once daily 06/25/19   Karamalegos, Netta Neat, DO  baclofen (LIORESAL) 10 MG tablet Take 0.5-1 tablets (5-10 mg total) by mouth 2 (two) times daily as needed for muscle spasms (leg pain). 01/25/19   Karamalegos,  Netta Neat, DO  BD VEO INSULIN SYRINGE U/F 31G X 15/64" 0.3 ML MISC USE AS DIRECTED TWICE DAILY 02/06/20   Althea Charon, Netta Neat, DO  clopidogrel (PLAVIX) 75 MG tablet Take 1 tablet (75 mg total) by mouth daily. 08/16/19   Karamalegos, Netta Neat, DO  ezetimibe (ZETIA) 10 MG tablet Take 1 tablet by mouth once daily 02/06/20   Althea Charon, Netta Neat, DO  glucose blood (ACCU-CHEK AVIVA PLUS) test strip Use to check blood sugar up to 3 x daily 07/10/19   Karamalegos, Alexander J, DO  HUMALOG MIX 75/25 (75-25) 100 UNIT/ML SUSP injection INJECT 17 UNITS SUBCUTANEOUSLY TWICE DAILY WITH A MEAL .  MAX  OF  50  UNITS  DAILY 02/06/20   Althea Charon, Netta Neat, DO  hydrochlorothiazide (HYDRODIURIL) 25 MG tablet Take 1 tablet by mouth once daily 11/08/19   Althea Charon, Netta Neat, DO  isosorbide mononitrate (IMDUR) 30 MG 24 hr tablet Take 1 tablet (30 mg total) by mouth daily. 08/16/19   Karamalegos, Netta Neat, DO  lisinopril (ZESTRIL) 10 MG tablet Take 1 tablet by mouth once daily 02/06/20   Althea Charon, Netta Neat, DO  metFORMIN (GLUCOPHAGE) 1000 MG tablet TAKE 1 TABLET BY MOUTH TWICE DAILY WITH A MEAL 08/16/19   Karamalegos, Netta Neat, DO  metoprolol succinate (TOPROL-XL) 50 MG 24 hr tablet TAKE 1 TABLET BY MOUTH ONCE DAILY. TAKE WITH OR IMMEDIATELY FOLLOWING A MEAL. 11/08/19   Smitty Cords, DO  Syringe, Disposable, 3 ML MISC Use to inject humalog mix 75/25 17u BID 01/25/19   Karamalegos, Alexander J, DO  TRULICITY 0.75 MG/0.5ML SOPN Inject 0.5 mLs (0.75 mg total) into the skin once a week. 01/09/20   Smitty Cords, DO    Review of Systems    Ongoing chest wall tightness ever since his surgery and left calf cramping.  He denies dyspnea, palpitations, PND, orthopnea, dizziness, syncope, edema, or early satiety.  All other systems reviewed and are otherwise negative except as noted above.  Physical Exam    VS:  BP 122/76 (BP Location: Left Arm, Patient Position: Sitting, Cuff Size: Large)    Pulse 96   Ht 5\' 8"  (1.727 m)   Wt (!) 323 lb 6 oz (146.7 kg)   SpO2 97%   BMI 49.17 kg/m  , BMI Body mass index is 49.17 kg/m. GEN: Well nourished, well developed, in no acute distress. HEENT: normal. Neck: Supple, no JVD, carotid bruits, or masses. Cardiac: RRR, no murmurs, rubs, or gallops. No clubbing, cyanosis, edema.  Radials/PT 2+ and equal bilaterally.  Respiratory:  Respirations regular and unlabored, clear to auscultation bilaterally. GI: Soft, nontender, nondistended, BS + x 4. MS: no deformity or atrophy. Skin: warm and dry, no rash. Neuro:  Strength and sensation are intact. Psych: Normal affect.  Accessory Clinical Findings    ECG personally reviewed by me today -regular sinus rhythm, 96, prior septal infarct - no acute changes.  Lab Results  Component Value Date   WBC 4.1 06/15/2019   HGB 13.8 06/15/2019   HCT 42.5 06/15/2019  MCV 83.7 06/15/2019   PLT 179 06/15/2019   Lab Results  Component Value Date   CREATININE 1.07 06/15/2019   BUN 17 06/15/2019   NA 135 06/15/2019   K 4.1 06/15/2019   CL 97 (L) 06/15/2019   CO2 26 06/15/2019   Lab Results  Component Value Date   ALT 48 (H) 06/15/2019   AST 37 (H) 06/15/2019   ALKPHOS 66 06/09/2016   BILITOT 0.3 06/15/2019   Lab Results  Component Value Date   CHOL 105 06/15/2019   HDL 36 (L) 06/15/2019   LDLCALC 53 06/15/2019   TRIG 80 06/15/2019   CHOLHDL 2.9 06/15/2019    Lab Results  Component Value Date   HGBA1C 6.4 (A) 01/09/2020    Assessment & Plan    1.  Coronary artery disease: Status post three-vessel bypass in July 2016.  He notes that since then, he has had intermittent incisional/chest wall tightness that occurs with standing, while walking in the heat, or more recently while sometimes riding his exercise bicycle.  Typically he can ride his bike for 20 minutes without symptoms or limitations but sometimes if he rides for longer he might notice a mild 3/10 tightness.  He has not had any  left lateral chest pain which was his prior anginal equivalent and overall, he feels that his exercise tolerance continues to improve over the past 2 months.  We discussed that if he notes any worsening exercise tolerance or more progressive symptoms of chest tightness, that we would have a low threshold to pursue ischemic testing.  He remains on aspirin, statin, Plavix, Zetia, nitrate, ACE inhibitor, and beta-blocker therapy.  2.  Essential hypertension: Stable on beta-blocker, ACE inhibitor, diuretic therapy.  3.  Hyperlipidemia: LDL of 53 last December.  Very mildly elevated LFTs at that time.  He remains on statin and Zetia therapy.  4.  Type 2 diabetes mellitus: Most recent A1c was 6.4 in July.  Managed by primary care.  5.  Left leg cramping: This is somewhat chronic and intermittent.  As he is on HCTZ, will follow-up basic metabolic panel today to rule out hypokalemia, though this was not present on previous checks.  He is using baclofen as needed with limited effect.  Previous ABIs were normal in late 2019 and symptoms do not appear to be particularly tied to activity.  6.  Obstructive sleep apnea: Reports compliance with CPAP.  7.  Disposition: Follow-up basic metabolic panel today.  Follow-up in clinic in 6 months or sooner if necessary.   Nicolasa Ducking, NP 02/20/2020, 5:50 PM

## 2020-02-20 NOTE — Patient Instructions (Signed)
Medication Instructions:  Your physician recommends that you continue on your current medications as directed. Please refer to the Current Medication list given to you today.  *If you need a refill on your cardiac medications before your next appointment, please call your pharmacy*   Lab Work: Your physician recommends that you have lab work today(BMET)   If you have labs (blood work) drawn today and your tests are completely normal, you will receive your results only by: Marland Kitchen MyChart Message (if you have MyChart) OR . A paper copy in the mail If you have any lab test that is abnormal or we need to change your treatment, we will call you to review the results.   Testing/Procedures: None ordered   Follow-Up: At [facility], you and your health needs are our priority.  As part of our continuing mission to provide you with exceptional heart care, we have created designated Provider Care Teams.  These Care Teams include your primary Cardiologist (physician) and Advanced Practice Providers (APPs -  Physician Assistants and Nurse Practitioners) who all work together to provide you with the care you need, when you need it.  We recommend signing up for the patient portal called "MyChart".  Sign up information is provided on this After Visit Summary.  MyChart is used to connect with patients for Virtual Visits (Telemedicine).  Patients are able to view lab/test results, encounter notes, upcoming appointments, etc.  Non-urgent messages can be sent to your provider as well.   To learn more about what you can do with MyChart, go to [email or web address].    Your next appointment:   6 month(s)  The format for your next appointment:   In Person  Provider:    You may see Julien Nordmann, MD or Nicolasa Ducking, NP

## 2020-02-21 LAB — BASIC METABOLIC PANEL
BUN/Creatinine Ratio: 14 (ref 10–24)
BUN: 12 mg/dL (ref 8–27)
CO2: 23 mmol/L (ref 20–29)
Calcium: 10.1 mg/dL (ref 8.6–10.2)
Chloride: 96 mmol/L (ref 96–106)
Creatinine, Ser: 0.85 mg/dL (ref 0.76–1.27)
GFR calc Af Amer: 109 mL/min/{1.73_m2} (ref >59)
GFR calc non Af Amer: 94 mL/min/{1.73_m2} (ref >59)
Glucose: 121 mg/dL — ABNORMAL HIGH (ref 65–99)
Potassium: 4.5 mmol/L (ref 3.5–5.2)
Sodium: 133 mmol/L — ABNORMAL LOW (ref 134–144)

## 2020-03-04 LAB — HM DIABETES EYE EXAM

## 2020-03-06 ENCOUNTER — Encounter: Payer: Self-pay | Admitting: Family Medicine

## 2020-04-14 ENCOUNTER — Ambulatory Visit: Payer: Medicaid Other | Admitting: Family Medicine

## 2020-04-18 ENCOUNTER — Ambulatory Visit (INDEPENDENT_AMBULATORY_CARE_PROVIDER_SITE_OTHER): Payer: Medicaid Other | Admitting: Family Medicine

## 2020-04-18 ENCOUNTER — Other Ambulatory Visit: Payer: Self-pay

## 2020-04-18 ENCOUNTER — Encounter: Payer: Self-pay | Admitting: Family Medicine

## 2020-04-18 VITALS — BP 140/90 | HR 101 | Temp 96.7°F | Resp 16 | Ht 68.0 in | Wt 329.0 lb

## 2020-04-18 DIAGNOSIS — Z23 Encounter for immunization: Secondary | ICD-10-CM | POA: Diagnosis not present

## 2020-04-18 DIAGNOSIS — Z6841 Body Mass Index (BMI) 40.0 and over, adult: Secondary | ICD-10-CM | POA: Diagnosis not present

## 2020-04-18 DIAGNOSIS — E1151 Type 2 diabetes mellitus with diabetic peripheral angiopathy without gangrene: Secondary | ICD-10-CM | POA: Diagnosis not present

## 2020-04-18 LAB — POCT GLYCOSYLATED HEMOGLOBIN (HGB A1C): Hemoglobin A1C: 6.7 % — AB (ref 4.0–5.6)

## 2020-04-18 MED ORDER — HUMALOG MIX 75/25 (75-25) 100 UNIT/ML ~~LOC~~ SUSP: SUBCUTANEOUS | 5 refills | Status: DC

## 2020-04-18 NOTE — Progress Notes (Signed)
Subjective:    Patient ID: Craig Harvey, male    DOB: 15-Feb-1956, 64 y.o.   MRN: [id number]  Craig Harvey is a 64 y.o. male presenting on 04/18/2020 for Diabetes   HPI   CHRONIC DM, Type 2: - Last visit with me 01/2020, for same problem, treated with Trulicity 0.75mg  weekly injection new start GLP1, see prior notes for background information. - Interval update with intolerance side effects to Trulicity, felt fatigued tired groggy drained for several days then end of week would improve, seemed to repeat each dose, still happening 3 months later. - Today patient reports he prefers to return back to insulin regimen and stop trulicity CBG 127-130 Meds:Metformin 1000mg  BID, Trulicity 0.75mg  weekly - Previous HumalogVials75/25 - 17u BID wc Reports good compliance. Tolerating well w/o side-effects Currently on ACEi Lifestyle: - Diet (trying to improve diet) UTD DM eye No significanthypoglycemia. Prior1 or less amonth, sometimes minimal, has CBG 90-100 with symptoms, but no significant low hypoglycemia  HYPERLIPIDEMIA/ Morbid Obesity BMI >50 - Reports no concerns. Last lipid panel12/2020, controlled  - Currently takingAtorvastatin 80mg , Zetia 10mg , tolerating well without side effects or myalgias - weight improved. Mild Elevated LFTs - virtually normal. He has no new concerns or symptoms.  OSA, on CPAP - Patient reports prior history of dx OSA and on CPAP years. Last sleep study >10 year ago - Today reports that sleep apnea is well controlled. uses the CPAP machine every night. Tolerates the machine well, and thinks that sleeps better with it and feels good. No new concerns or symptoms.  CAD / Vascular Disease/ S/p CABG Followed [facility] Dr . history of CABG 01/2015 He continues on DAPT with Plavix 75 and ASA 81 He remains chest pain free. Continues on BB, Imdur, Statin, Zetia   Health Maintenance: Cologuard negative 07/07/19, next due  07/2022  Due for Flu Shot, will receive today    Depression screen [facility] 2/9 04/18/2020 01/09/2020 06/20/2019  Decreased Interest 0 0 0  Down, Depressed, Hopeless 0 0 0  PHQ - 2 Score 0 0 0  Altered sleeping - - -  Tired, decreased energy - - -  Change in appetite - - -  Feeling bad or failure about yourself  - - -  Trouble concentrating - - -  Moving slowly or fidgety/restless - - -  Suicidal thoughts - - -  PHQ-9 Score - - -  Difficult doing work/chores - - -    Social History   Tobacco Use  . Smoking status: Former Smoker    Quit date: 08/06/1975    Years since quitting: 44.7  . Smokeless tobacco: Never Used  Vaping Use  . Vaping Use: Never used  Substance Use Topics  . Alcohol use: No  . Drug use: No    Review of Systems Per HPI unless specifically indicated above     Objective:    BP 140/90   Pulse (!) 101   Temp (!) 96.7 F (35.9 C) (Temporal)   Resp 16   Ht 5\' 8"  (1.727 m)   Wt (!) 329 lb (149.2 kg)   SpO2 97%   BMI 50.02 kg/m   Wt Readings from Last 3 Encounters:  04/18/20 (!) 329 lb (149.2 kg)  02/20/20 (!) 323 lb 6 oz (146.7 kg)  01/09/20 (!) 327 lb 9.6 oz (148.6 kg)    Physical Exam Vitals and nursing note reviewed.  Constitutional:      General: He is not in acute distress.  Appearance: He is well-developed. He is obese. He is not diaphoretic.     Comments: Well-appearing, comfortable, cooperative  HENT:     Head: Normocephalic and atraumatic.  Eyes:     General:        Right eye: No discharge.        Left eye: No discharge.     Conjunctiva/sclera: Conjunctivae normal.  Neck:     Thyroid: No thyromegaly.  Cardiovascular:     Rate and Rhythm: Normal rate and regular rhythm.     Heart sounds: Normal heart sounds. No murmur heard.   Pulmonary:     Effort: Pulmonary effort is normal. No respiratory distress.     Breath sounds: Normal breath sounds. No wheezing or rales.  Musculoskeletal:        General: Normal range of motion.      Cervical back: Normal range of motion and neck supple.  Lymphadenopathy:     Cervical: No cervical adenopathy.  Skin:    General: Skin is warm and dry.     Findings: No erythema or rash.  Neurological:     Mental Status: He is alert and oriented to person, place, and time.  Psychiatric:        Behavior: Behavior normal.     Comments: Well groomed, good eye contact, normal speech and thoughts     Recent Labs    06/15/19 0756 01/09/20 1402 04/18/20 1412  HGBA1C 7.1* 6.4* 6.7*     Results for orders placed or performed in visit on 04/18/20  POCT HgB A1C  Result Value Ref Range   Hemoglobin A1C 6.7 (A) 4.0 - 5.6 %      Assessment & Plan:   Problem List Items Addressed This Visit    Morbid obesity with BMI of 50.0-59.9, adult (HCC)   Relevant Medications   insulin lispro protamine-lispro (HUMALOG MIX 75/25) (75-25) 100 UNIT/ML SUSP injection   DM (diabetes mellitus), type 2 with peripheral vascular complications (HCC) - Primary    A1c up to 6.7 prior 6.4 Overall still well controlled at goal Intolerance to Trulicity, prefers to resume insulin No episodes of hypoglycemia. Occasional hyperglycemia. Complications - vascular disease including CAD s/p CABG, PAD, among other including hyperlipidemia, morbid obesity, and OSA - increases risk of future cardiovascular complications and poor glucose control  Plan:  1. STOP Trulicity 2. RESTART Humalog mix 75/25 vials 17 BID - Continue Metformin 1000mg  BID 2. Encourage improved lifestyle - low carb, low sugar diet, reduce portion size, continue improving regular exercise 3. Check CBG, bring log to next visit for review 4. Continue ASA, ACEi, Statin      Relevant Medications   insulin lispro protamine-lispro (HUMALOG MIX 75/25) (75-25) 100 UNIT/ML SUSP injection   Other Relevant Orders   POCT HgB A1C (Completed)    Other Visit Diagnoses    Needs flu shot       Relevant Orders   Flu Vaccine QUAD 6+ mos PF IM (Fluarix Quad PF)  (Completed)      Meds ordered this encounter  Medications  . insulin lispro protamine-lispro (HUMALOG MIX 75/25) (75-25) 100 UNIT/ML SUSP injection    Sig: INJECT 17 UNITS SUBCUTANEOUSLY TWICE DAILY WITH A MEAL TWICE DAILY    Dispense:  10 mL    Refill:  5     Follow up plan: Return in about 3 months (around 07/19/2020) for 3 month fasting lab only then 1 week later Annual Physical.  Future labs ordered for 07/15/20  09/12/20,  DO [facility] 04/18/2020, 1:59 PM

## 2020-04-18 NOTE — Patient Instructions (Addendum)
Thank you for coming to the office today.  Discontinue Trulicity due to side effects.  Switch back to the Insulin mix as you were before can do the 17 u twice day - re ordered medicine.  Consider other meds in the future.  Rybelsus (oral, once daily similar to Trulicity - actually this is Ozempic)  Januvia (oral pill daily, lesser version of those medicines but it can be effective as well)  Recent Labs    06/15/19 0756 01/09/20 1402 04/18/20 1412  HGBA1C 7.1* 6.4* 6.7*     DUE for FASTING BLOOD WORK (no food or drink after midnight before the lab appointment, only water or coffee without cream/sugar on the morning of)  SCHEDULE "Lab Only" visit in the morning at the clinic for lab draw in 3 MONTHS   - Make sure Lab Only appointment is at about 1 week before your next appointment, so that results will be available  For Lab Results, once available within 2-3 days of blood draw, you can can log in to MyChart online to view your results and a brief explanation. Also, we can discuss results at next follow-up visit.   Please schedule a Follow-up Appointment to: Return in about 3 months (around 07/19/2020) for 3 month fasting lab only then 1 week later Annual Physical.  If you have any other questions or concerns, please feel free to call the office or send a message through MyChart. You may also schedule an earlier appointment if necessary.  Additionally, you may be receiving a survey about your experience at our office within a few days to 1 week by e-mail or mail. We value your feedback.  Saralyn Pilar, DO [facility], [place]

## 2020-04-19 ENCOUNTER — Other Ambulatory Visit: Payer: Self-pay | Admitting: Family Medicine

## 2020-04-19 DIAGNOSIS — E1151 Type 2 diabetes mellitus with diabetic peripheral angiopathy without gangrene: Secondary | ICD-10-CM

## 2020-04-19 DIAGNOSIS — E785 Hyperlipidemia, unspecified: Secondary | ICD-10-CM

## 2020-04-19 DIAGNOSIS — Z0000 Encounter for general adult medical examination without abnormal findings: Secondary | ICD-10-CM

## 2020-04-19 DIAGNOSIS — Z125 Encounter for screening for malignant neoplasm of prostate: Secondary | ICD-10-CM

## 2020-04-19 DIAGNOSIS — I25118 Atherosclerotic heart disease of native coronary artery with other forms of angina pectoris: Secondary | ICD-10-CM

## 2020-04-19 DIAGNOSIS — E1169 Type 2 diabetes mellitus with other specified complication: Secondary | ICD-10-CM

## 2020-04-19 DIAGNOSIS — G4733 Obstructive sleep apnea (adult) (pediatric): Secondary | ICD-10-CM

## 2020-04-19 NOTE — Assessment & Plan Note (Signed)
A1c up to 6.7 prior 6.4 Overall still well controlled at goal Intolerance to Trulicity, prefers to resume insulin No episodes of hypoglycemia. Occasional hyperglycemia. Complications - vascular disease including CAD s/p CABG, PAD, among other including hyperlipidemia, morbid obesity, and OSA - increases risk of future cardiovascular complications and poor glucose control  Plan:  1. STOP Trulicity 2. RESTART Humalog mix 75/25 vials 17 BID - Continue Metformin 1000mg  BID 2. Encourage improved lifestyle - low carb, low sugar diet, reduce portion size, continue improving regular exercise 3. Check CBG, bring log to next visit for review 4. Continue ASA, ACEi, Statin

## 2020-05-06 ENCOUNTER — Other Ambulatory Visit: Payer: Self-pay | Admitting: Family Medicine

## 2020-05-06 DIAGNOSIS — I10 Essential (primary) hypertension: Secondary | ICD-10-CM

## 2020-05-06 DIAGNOSIS — I2510 Atherosclerotic heart disease of native coronary artery without angina pectoris: Secondary | ICD-10-CM

## 2020-05-06 DIAGNOSIS — E1151 Type 2 diabetes mellitus with diabetic peripheral angiopathy without gangrene: Secondary | ICD-10-CM

## 2020-05-06 NOTE — Telephone Encounter (Signed)
Requested Prescriptions  Pending Prescriptions Disp Refills  . metoprolol succinate (TOPROL-XL) 50 MG 24 hr tablet [Pharmacy Med Name: Metoprolol Succinate ER 50 MG Oral Tablet Extended Release 24 Hour] 90 tablet 0    Sig: TAKE 1 TABLET BY MOUTH ONCE DAILY.  TAKE WITH OR IMMEDIATELY FOLLOWING A MEAL     Cardiovascular:  Beta Blockers Failed - 05/06/2020  3:26 PM      Failed - Last BP in normal range    BP Readings from Last 1 Encounters:  04/18/20 140/90         Passed - Last Heart Rate in normal range    Pulse Readings from Last 1 Encounters:  04/18/20 (!) 101         Passed - Valid encounter within last 6 months    Recent Outpatient Visits          2 weeks ago DM (diabetes mellitus), type 2 with peripheral vascular complications (HCC)   [facility], Netta Neat, DO   3 months ago DM (diabetes mellitus), type 2 with peripheral vascular complications [facility])   [facility] Smitty Cords, DO   10 months ago Annual physical exam   [facility] Smitty Cords, DO   1 year ago DM (diabetes mellitus), type 2 with peripheral vascular complications [facility])   [facility] Smitty Cords, DO   1 year ago Annual physical exam   [facility] Smitty Cords, DO      Future Appointments            In 2 months Althea Charon, Netta Neat, DO [facility]   In 3 months Gollan, Tollie Pizza, MD [facility], [facility]           . insulin lispro protamine-lispro (HUMALOG MIX 75/25) (75-25) 100 UNIT/ML SUSP injection [Pharmacy Med Name: HumaLOG Mix 75/25 (75-25) 100 UNIT/ML Subcutaneous Suspension] 10 mL 0    Sig: INJECT 17 UNITS SUBCUTANEOUSLY TWICE DAILY WITH A MEAL.  MAX OF 50 UNITS DAILY     Endocrinology:  Diabetes - Insulins Passed - 05/06/2020  3:26 PM      Passed - HBA1C is between 0 and 7.9 and within 180 days    Hemoglobin A1C   Date Value Ref Range Status  04/18/2020 6.7 (A) 4.0 - 5.6 % Final  04/18/2017 6.4  Final   Hgb A1c MFr Bld  Date Value Ref Range Status  06/15/2019 7.1 (H) <5.7 % of total Hgb Final    Comment:    For someone without known diabetes, a hemoglobin A1c value of 6.5% or greater indicates that they may have  diabetes and this should be confirmed with a follow-up  test. . For someone with known diabetes, a value <7% indicates  that their diabetes is well controlled and a value  greater than or equal to 7% indicates suboptimal  control. A1c targets should be individualized based on  duration of diabetes, age, comorbid conditions, and  other considerations. . Currently, no consensus exists regarding use of hemoglobin A1c for diagnosis of diabetes for children. Verna Czech - Valid encounter within last 6 months    Recent Outpatient Visits          2 weeks ago DM (diabetes mellitus), type 2 with peripheral vascular complications [facility])   [facility] [place], Netta Neat, DO   3 months  ago DM (diabetes mellitus), type 2 with peripheral vascular complications [facility])   [facility] Althea Charon, Netta Neat, DO   10 months ago Annual physical exam   [facility] [place], Netta Neat, DO   1 year ago DM (diabetes mellitus), type 2 with peripheral vascular complications [facility])   [facility] Smitty Cords, DO   1 year ago Annual physical exam   [facility] Smitty Cords, DO      Future Appointments            In 2 months Althea Charon, Netta Neat, DO [facility], [facility]   In 3 months Gollan, Tollie Pizza, MD [facility], [facility]           . hydrochlorothiazide (HYDRODIURIL) 25 MG tablet [Pharmacy Med Name: hydroCHLOROthiazide 25 MG Oral Tablet] 90 tablet 0    Sig: Take 1 tablet by mouth once daily     Cardiovascular: Diuretics - Thiazide  Failed - 05/06/2020  3:26 PM      Failed - Na in normal range and within 360 days    Sodium  Date Value Ref Range Status  02/20/2020 133 (L) 134 - 144 mmol/L Final         Failed - Last BP in normal range    BP Readings from Last 1 Encounters:  04/18/20 140/90         Passed - Ca in normal range and within 360 days    Calcium  Date Value Ref Range Status  02/20/2020 10.1 8.6 - 10.2 mg/dL Final  14/43/1540 9.5  Final   Calcium, Ion  Date Value Ref Range Status  01/15/2015 1.17 1.12 - 1.23 mmol/L Final         Passed - Cr in normal range and within 360 days    Creat  Date Value Ref Range Status  06/15/2019 1.07 0.70 - 1.25 mg/dL Final    Comment:    For patients >68 years of age, the reference limit for Creatinine is approximately 13% higher for people identified as African-American. .    Creatinine, Ser  Date Value Ref Range Status  02/20/2020 0.85 0.76 - 1.27 mg/dL Final         Passed - K in normal range and within 360 days    Potassium  Date Value Ref Range Status  02/20/2020 4.5 3.5 - 5.2 mmol/L Final         Passed - Valid encounter within last 6 months    Recent Outpatient Visits          2 weeks ago DM (diabetes mellitus), type 2 with peripheral vascular complications [facility])   [facility], Netta Neat, DO   3 months ago DM (diabetes mellitus), type 2 with peripheral vascular complications [facility])   [facility] Smitty Cords, DO   10 months ago Annual physical exam   [facility] Smitty Cords, DO   1 year ago DM (diabetes mellitus), type 2 with peripheral vascular complications [facility])   [facility] Smitty Cords, DO   1 year ago Annual physical exam   [facility] Smitty Cords, DO      Future Appointments            In 2 months Althea Charon, Netta Neat, DO [facility], [facility]   In 3 months Gollan, Tollie Pizza, MD [facility],  [facility]

## 2020-06-06 ENCOUNTER — Other Ambulatory Visit: Payer: Self-pay | Admitting: Family Medicine

## 2020-06-06 DIAGNOSIS — E1151 Type 2 diabetes mellitus with diabetic peripheral angiopathy without gangrene: Secondary | ICD-10-CM

## 2020-06-09 ENCOUNTER — Other Ambulatory Visit: Payer: Self-pay | Admitting: Family Medicine

## 2020-06-09 DIAGNOSIS — E1169 Type 2 diabetes mellitus with other specified complication: Secondary | ICD-10-CM

## 2020-06-09 DIAGNOSIS — E785 Hyperlipidemia, unspecified: Secondary | ICD-10-CM

## 2020-07-15 ENCOUNTER — Other Ambulatory Visit: Payer: Self-pay

## 2020-07-15 ENCOUNTER — Other Ambulatory Visit: Payer: Medicaid Other

## 2020-07-15 DIAGNOSIS — G4733 Obstructive sleep apnea (adult) (pediatric): Secondary | ICD-10-CM

## 2020-07-15 DIAGNOSIS — I25118 Atherosclerotic heart disease of native coronary artery with other forms of angina pectoris: Secondary | ICD-10-CM | POA: Diagnosis not present

## 2020-07-15 DIAGNOSIS — Z0000 Encounter for general adult medical examination without abnormal findings: Secondary | ICD-10-CM | POA: Diagnosis not present

## 2020-07-15 DIAGNOSIS — Z6841 Body Mass Index (BMI) 40.0 and over, adult: Secondary | ICD-10-CM | POA: Diagnosis not present

## 2020-07-15 DIAGNOSIS — E1151 Type 2 diabetes mellitus with diabetic peripheral angiopathy without gangrene: Secondary | ICD-10-CM

## 2020-07-15 DIAGNOSIS — E1169 Type 2 diabetes mellitus with other specified complication: Secondary | ICD-10-CM

## 2020-07-15 DIAGNOSIS — Z9989 Dependence on other enabling machines and devices: Secondary | ICD-10-CM | POA: Diagnosis not present

## 2020-07-15 DIAGNOSIS — Z125 Encounter for screening for malignant neoplasm of prostate: Secondary | ICD-10-CM

## 2020-07-15 DIAGNOSIS — E785 Hyperlipidemia, unspecified: Secondary | ICD-10-CM | POA: Diagnosis not present

## 2020-07-16 LAB — COMPLETE METABOLIC PANEL WITH GFR
AG Ratio: 1.5 (calc) (ref 1.0–2.5)
ALT: 42 U/L (ref 9–46)
AST: 36 U/L — ABNORMAL HIGH (ref 10–35)
Albumin: 4.3 g/dL (ref 3.6–5.1)
Alkaline phosphatase (APISO): 48 U/L (ref 35–144)
BUN: 16 mg/dL (ref 7–25)
CO2: 26 mmol/L (ref 20–32)
Calcium: 9.8 mg/dL (ref 8.6–10.3)
Chloride: 99 mmol/L (ref 98–110)
Creat: 0.85 mg/dL (ref 0.70–1.25)
GFR, Est African American: 108 mL/min/{1.73_m2} (ref >=60)
GFR, Est Non African American: 93 mL/min/{1.73_m2} (ref >=60)
Globulin: 2.9 g/dL (calc) (ref 1.9–3.7)
Glucose, Bld: 122 mg/dL — ABNORMAL HIGH (ref 65–99)
Potassium: 4.3 mmol/L (ref 3.5–5.3)
Sodium: 135 mmol/L (ref 135–146)
Total Bilirubin: 0.4 mg/dL (ref 0.2–1.2)
Total Protein: 7.2 g/dL (ref 6.1–8.1)

## 2020-07-16 LAB — CBC WITH DIFFERENTIAL/PLATELET
Absolute Monocytes: 537 cells/uL (ref 200–950)
Basophils Absolute: 31 cells/uL (ref 0–200)
Basophils Relative: 0.7 %
Eosinophils Absolute: 123 cells/uL (ref 15–500)
Eosinophils Relative: 2.8 %
HCT: 37.2 % — ABNORMAL LOW (ref 38.5–50.0)
Hemoglobin: 12.1 g/dL — ABNORMAL LOW (ref 13.2–17.1)
Lymphs Abs: 1245 cells/uL (ref 850–3900)
MCH: 26 pg — ABNORMAL LOW (ref 27.0–33.0)
MCHC: 32.5 g/dL (ref 32.0–36.0)
MCV: 79.8 fL — ABNORMAL LOW (ref 80.0–100.0)
MPV: 11.2 fL (ref 7.5–12.5)
Monocytes Relative: 12.2 %
Neutro Abs: 2464 cells/uL (ref 1500–7800)
Neutrophils Relative %: 56 %
Platelets: 199 10*3/uL (ref 140–400)
RBC: 4.66 10*6/uL (ref 4.20–5.80)
RDW: 15.7 % — ABNORMAL HIGH (ref 11.0–15.0)
Total Lymphocyte: 28.3 %
WBC: 4.4 10*3/uL (ref 3.8–10.8)

## 2020-07-16 LAB — LIPID PANEL
Cholesterol: 110 mg/dL (ref <200)
HDL: 41 mg/dL (ref >=40)
LDL Cholesterol (Calc): 54 mg/dL (calc)
Non-HDL Cholesterol (Calc): 69 mg/dL (calc) (ref <130)
Total CHOL/HDL Ratio: 2.7 (calc) (ref <5.0)
Triglycerides: 70 mg/dL (ref <150)

## 2020-07-16 LAB — HEMOGLOBIN A1C
Hgb A1c MFr Bld: 7.3 % of total Hgb — ABNORMAL HIGH (ref <5.7)
Mean Plasma Glucose: 163 mg/dL
eAG (mmol/L): 9 mmol/L

## 2020-07-16 LAB — PSA: PSA: 0.21 ng/mL (ref <=4.0)

## 2020-07-22 ENCOUNTER — Ambulatory Visit (INDEPENDENT_AMBULATORY_CARE_PROVIDER_SITE_OTHER): Payer: Medicaid Other | Admitting: Family Medicine

## 2020-07-22 ENCOUNTER — Other Ambulatory Visit: Payer: Self-pay

## 2020-07-22 ENCOUNTER — Encounter: Payer: Self-pay | Admitting: Family Medicine

## 2020-07-22 VITALS — BP 125/70 | HR 87 | Ht 68.0 in | Wt 328.2 lb

## 2020-07-22 DIAGNOSIS — E1151 Type 2 diabetes mellitus with diabetic peripheral angiopathy without gangrene: Secondary | ICD-10-CM | POA: Diagnosis not present

## 2020-07-22 DIAGNOSIS — G4733 Obstructive sleep apnea (adult) (pediatric): Secondary | ICD-10-CM

## 2020-07-22 DIAGNOSIS — E785 Hyperlipidemia, unspecified: Secondary | ICD-10-CM

## 2020-07-22 DIAGNOSIS — I2510 Atherosclerotic heart disease of native coronary artery without angina pectoris: Secondary | ICD-10-CM

## 2020-07-22 DIAGNOSIS — E1169 Type 2 diabetes mellitus with other specified complication: Secondary | ICD-10-CM

## 2020-07-22 DIAGNOSIS — Z6841 Body Mass Index (BMI) 40.0 and over, adult: Secondary | ICD-10-CM

## 2020-07-22 DIAGNOSIS — I10 Essential (primary) hypertension: Secondary | ICD-10-CM | POA: Diagnosis not present

## 2020-07-22 DIAGNOSIS — E1159 Type 2 diabetes mellitus with other circulatory complications: Secondary | ICD-10-CM | POA: Diagnosis not present

## 2020-07-22 DIAGNOSIS — I25118 Atherosclerotic heart disease of native coronary artery with other forms of angina pectoris: Secondary | ICD-10-CM

## 2020-07-22 DIAGNOSIS — M79605 Pain in left leg: Secondary | ICD-10-CM

## 2020-07-22 DIAGNOSIS — Z0000 Encounter for general adult medical examination without abnormal findings: Secondary | ICD-10-CM | POA: Diagnosis not present

## 2020-07-22 DIAGNOSIS — Z9989 Dependence on other enabling machines and devices: Secondary | ICD-10-CM

## 2020-07-22 DIAGNOSIS — R0789 Other chest pain: Secondary | ICD-10-CM | POA: Diagnosis not present

## 2020-07-22 DIAGNOSIS — G8929 Other chronic pain: Secondary | ICD-10-CM

## 2020-07-22 MED ORDER — ISOSORBIDE MONONITRATE ER 30 MG PO TB24: 30.0000 mg | ORAL_TABLET | Freq: Every day | ORAL | 3 refills | Status: DC

## 2020-07-22 MED ORDER — METFORMIN HCL 1000 MG PO TABS
ORAL_TABLET | ORAL | 3 refills | Status: DC
Start: 2020-07-22 — End: 2021-07-23

## 2020-07-22 MED ORDER — HYDROCHLOROTHIAZIDE 25 MG PO TABS: 25.0000 mg | ORAL_TABLET | Freq: Every day | ORAL | 3 refills | Status: DC

## 2020-07-22 MED ORDER — EZETIMIBE 10 MG PO TABS: 10.0000 mg | ORAL_TABLET | Freq: Every day | ORAL | 3 refills | Status: DC

## 2020-07-22 MED ORDER — BACLOFEN 10 MG PO TABS: 5.0000 mg | ORAL_TABLET | Freq: Two times a day (BID) | ORAL | 1 refills | Status: DC | PRN

## 2020-07-22 MED ORDER — HUMALOG MIX 75/25 (75-25) 100 UNIT/ML ~~LOC~~ SUSP: SUBCUTANEOUS | 5 refills | Status: DC

## 2020-07-22 MED ORDER — ATORVASTATIN CALCIUM 80 MG PO TABS: 80.0000 mg | ORAL_TABLET | Freq: Every day | ORAL | 3 refills | Status: DC

## 2020-07-22 MED ORDER — LISINOPRIL 10 MG PO TABS
10.0000 mg | ORAL_TABLET | Freq: Every day | ORAL | 3 refills | Status: DC
Start: 2020-07-22 — End: 2021-07-23

## 2020-07-22 MED ORDER — CLOPIDOGREL BISULFATE 75 MG PO TABS: 75.0000 mg | ORAL_TABLET | Freq: Every day | ORAL | 3 refills | Status: DC

## 2020-07-22 MED ORDER — ACCU-CHEK AVIVA PLUS VI STRP
ORAL_STRIP | 12 refills | Status: DC
Start: 2020-07-22 — End: 2022-05-17

## 2020-07-22 MED ORDER — METOPROLOL SUCCINATE ER 50 MG PO TB24
ORAL_TABLET | ORAL | 3 refills | Status: DC
Start: 2020-07-22 — End: 2021-07-23

## 2020-07-22 NOTE — Patient Instructions (Addendum)
Thank you for coming to the office today.  All meds refilled up to 1 year  Next visit 6 months  Return sooner for leg pain if bothering you. Or we can refer to Ortho.  May increase iron rich foods for now.  Please schedule a Follow-up Appointment to: Return in about 6 months (around 01/19/2021) for 6 month DM A1c, leg pain.  If you have any other questions or concerns, please feel free to call the office or send a message through MyChart. You may also schedule an earlier appointment if necessary.  Additionally, you may be receiving a survey about your experience at our office within a few days to 1 week by e-mail or mail. We value your feedback.  Saralyn Pilar, DO [facility], [place]

## 2020-07-22 NOTE — Progress Notes (Signed)
Subjective:    Patient ID: Craig Harvey, male    DOB: 05/07/1956, 65 y.o.   MRN: [id number]  Craig Harvey is a 65 y.o. male presenting on 07/22/2020 for Annual Exam   HPI   Here for Annual Physical and Lab Review  CHRONIC DM, Type 2: Failed Trulicity due to side effect intolerance, off in past 3 months now back on insulin since 05/2020 CBG 120-130s Meds:Metformin 1000mg  BID, HumalogVials75/25 - 17u BID wc Reports good compliance. Tolerating well w/o side-effects Currently on ACEi Lifestyle: - Diet (trying to improve diet) UTD DM eye No significanthypoglycemia. Prior1 or less amonth, sometimes minimal, has CBG 90-100 with symptoms, but no significant low hypoglycemia  HYPERLIPIDEMIA/ Morbid Obesity BMI >49 - Reports no concerns. Last lipid 07/2020, controlled  - Currently takingAtorvastatin 80mg , Zetia 10mg , tolerating well without side effects or myalgias - weight improved. Mild Elevated LFTs normalized.  OSA, on CPAP - Patient reports prior history of dx OSA and on CPAP years. Last sleep study >10 year ago - Today reports that sleep apnea is well controlled. uses the CPAP machine every night. Tolerates the machine well, and thinks that sleeps better with it and feels good. No new concerns or symptoms.  CAD / Vascular Disease/ S/p CABG Followed by [facility] Dr . history of CABG 01/2015 He continues on DAPT with Plavix 75 and ASA 81 He remains chest pain free. Continues on BB, Imdur, Statin, Zetia    Health Maintenance: Cologuard negative 07/07/19, next due 07/2022  Due for Flu Shot, will receive today   COVID booster 07/17/20, completed. Pfizer.  PSA 0.21 negative, last 0.2, check yearly   Depression screen [facility] 2/9 04/18/2020 01/09/2020 06/20/2019  Decreased Interest 0 0 0  Down, Depressed, Hopeless 0 0 0  PHQ - 2 Score 0 0 0  Altered sleeping - - -  Tired, decreased energy - - -  Change in appetite - - -  Feeling bad or failure  about yourself  - - -  Trouble concentrating - - -  Moving slowly or fidgety/restless - - -  Suicidal thoughts - - -  PHQ-9 Score - - -  Difficult doing work/chores - - -    Past Medical History:  Diagnosis Date  . CAD (coronary artery disease)    a. 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 cardiac cath 01/09/2015: ostLAD 70:, mLAD 99%, ost to pLCx 95%, mLCx 80%, LVEF nl, recommend CABGl; c. s/p 3v CABG 01/14/15 LIMA-LAD, SVG-OM, SVG-Ramus  . History of echocardiogram    a. 01/2015 Echo: EF 60-65%, no rwma, nl RV fxn. Nl PASP.  03/12/2015 Hyperlipidemia LDL goal <70   . Hypertension   . Morbid obesity (HCC)   . OSA (obstructive sleep apnea)   . Type II diabetes mellitus (HCC)    Past Surgical History:  Procedure Laterality Date  . CARDIAC CATHETERIZATION N/A 01/09/2015   Procedure: Left Heart Cath and Coronary Angiography;  Surgeon: 02/2015, MD;  Location: [facility] INVASIVE CV LAB;  Service: Cardiovascular;  Laterality: N/A;  . CORONARY ARTERY BYPASS GRAFT N/A 01/14/2015   Procedure: CORONARY ARTERY BYPASS GRAFTING (CABG), ON PUMP, TIMES THREE, USING LEFT INTERNAL MAMMARY ARTERY, RIGHT GREATER SAPHENOUS VEIN HARVESTED ENDOSCOPICALLY;  Surgeon: 03/12/2015, MD;  Location: [facility] OR;  Service: Open Heart Surgery;  Laterality: N/A;  . HERNIA REPAIR    . TEE WITHOUT CARDIOVERSION N/A 01/14/2015   Procedure: TRANSESOPHAGEAL ECHOCARDIOGRAM (TEE);  Surgeon: Kerin PernaPeter Van Trigt, MD;  Location: [facility] OR;  Service: Open Heart Surgery;  Laterality: N/A;   Social History   Socioeconomic History  . Marital status: Single    Spouse name: Not on file  . Number of children: Not on file  . Years of education: Not on file  . Highest education level: Not on file  Occupational History  . Occupation: Former Naval architecttruck driver /  Production designer, theatre/television/filmfork lift operator  Tobacco Use  . Smoking status: Former Smoker    Quit date: 08/06/1975    Years since quitting: 44.9  . Smokeless tobacco: Never Used  Vaping Use  . Vaping Use: Never used  Substance and Sexual Activity  . Alcohol use: No  . Drug use: No  . Sexual activity: Not on file  Other Topics Concern  . Not on file  Social History Narrative  . Not on file   Social Determinants of Health   Financial Resource Strain: Not on file  Food Insecurity: Not on file  Transportation Needs: Not on file  Physical Activity: Not on file  Stress: Not on file  Social Connections: Not on file  Intimate Partner Violence: Not on file   Family History  Problem Relation Age of Onset  . CAD Mother   . CAD Brother   . Throat cancer Father   . Diabetes Neg Hx   . Cancer Neg Hx   . Prostate cancer Neg Hx   . Colon cancer Neg Hx    Current Outpatient Medications on File Prior to Visit  Medication Sig  . aspirin EC 81 MG tablet Take 1 tablet (81 mg total) by mouth daily.  . Insulin Syringe-Needle U-100 (BD VEO INSULIN SYRINGE U/F) 31G X 15/64" 0.3 ML MISC USE TWICE DAILY AS DIRECTED  . Syringe, Disposable, 3 ML MISC Use to inject humalog mix 75/25 17u BID   No current facility-administered medications on file prior to visit.    Review of Systems  Constitutional: Negative for activity change, appetite change, chills, diaphoresis, fatigue and fever.  HENT: Negative for congestion and hearing loss.   Eyes: Negative for visual disturbance.  Respiratory: Negative for apnea, cough, chest tightness, shortness of breath and wheezing.   Cardiovascular: Negative for chest pain, palpitations and leg swelling.  Gastrointestinal: Negative for abdominal pain, constipation, diarrhea, nausea and vomiting.  Endocrine: Negative for cold intolerance.  Genitourinary: Negative for difficulty urinating, dysuria, frequency and hematuria.  Musculoskeletal: Negative for arthralgias and neck pain.  Skin:  Negative for rash.  Allergic/Immunologic: Negative for environmental allergies.  Neurological: Negative for dizziness, weakness, light-headedness, numbness and headaches.  Hematological: Negative for adenopathy.  Psychiatric/Behavioral: Negative for behavioral problems, dysphoric mood and sleep disturbance.   Per HPI unless specifically indicated above      Objective:    BP 125/70 (BP Location: Left Arm, Cuff Size: Large)   Pulse 87   Ht 5\' 8"  (1.727 m)   Wt (!) 328 lb 3.2 oz (148.9 kg)   SpO2 96%   BMI 49.90 kg/m   Wt Readings from Last 3 Encounters:  07/22/20 (!) 328 lb 3.2 oz (148.9 kg)  04/18/20 (!) 329 lb (149.2 kg)  02/20/20 (!) 323 lb 6 oz (146.7 kg)    Physical Exam Vitals and nursing note reviewed.  Constitutional:      General: He is not in acute distress.  Appearance: He is well-developed and well-nourished. He is obese. He is not diaphoretic.     Comments: Well-appearing, comfortable, cooperative  HENT:     Head: Normocephalic and atraumatic.     Mouth/Throat:     Mouth: Oropharynx is clear and moist.  Eyes:     General:        Right eye: No discharge.        Left eye: No discharge.     Extraocular Movements: EOM normal.     Conjunctiva/sclera: Conjunctivae normal.     Pupils: Pupils are equal, round, and reactive to light.  Neck:     Thyroid: No thyromegaly.     Vascular: No carotid bruit.  Cardiovascular:     Rate and Rhythm: Normal rate and regular rhythm.     Pulses: Intact distal pulses.     Heart sounds: Normal heart sounds. No murmur heard.   Pulmonary:     Effort: Pulmonary effort is normal. No respiratory distress.     Breath sounds: Normal breath sounds. No wheezing or rales.  Abdominal:     General: Bowel sounds are normal. There is no distension.     Palpations: Abdomen is soft. There is no mass.     Tenderness: There is no abdominal tenderness.  Musculoskeletal:        General: No tenderness or edema. Normal range of motion.      Cervical back: Normal range of motion and neck supple.     Right lower leg: No edema.     Left lower leg: No edema.     Comments: Upper / Lower Extremities: - Normal muscle tone, strength bilateral upper extremities 5/5, lower extremities 5/5  Lymphadenopathy:     Cervical: No cervical adenopathy.  Skin:    General: Skin is warm and dry.     Findings: No erythema or rash.  Neurological:     Mental Status: He is alert and oriented to person, place, and time.     Comments: Distal sensation intact to light touch all extremities  Psychiatric:        Mood and Affect: Mood and affect normal.        Behavior: Behavior normal.     Comments: Well groomed, good eye contact, normal speech and thoughts       Results for orders placed or performed in visit on 07/15/20  PSA  Result Value Ref Range   PSA 0.21 < OR = 4.0 ng/mL  Lipid panel  Result Value Ref Range   Cholesterol 110 <200 mg/dL   HDL 41 > OR = 40 mg/dL   Triglycerides 70 <694 mg/dL   LDL Cholesterol (Calc) 54 mg/dL (calc)   Total CHOL/HDL Ratio 2.7 <5.0 (calc)   Non-HDL Cholesterol (Calc) 69 <854 mg/dL (calc)  COMPLETE METABOLIC PANEL WITH GFR  Result Value Ref Range   Glucose, Bld 122 (H) 65 - 99 mg/dL   BUN 16 7 - 25 mg/dL   Creat 6.27 0.35 - 0.09 mg/dL   GFR, Est Non African American 93 > OR = 60 mL/min/1.76m2   GFR, Est African American 108 > OR = 60 mL/min/1.26m2   BUN/Creatinine Ratio NOT APPLICABLE 6 - 22 (calc)   Sodium 135 135 - 146 mmol/L   Potassium 4.3 3.5 - 5.3 mmol/L   Chloride 99 98 - 110 mmol/L   CO2 26 20 - 32 mmol/L   Calcium 9.8 8.6 - 10.3 mg/dL   Total Protein 7.2 6.1 - 8.1 g/dL  Albumin 4.3 3.6 - 5.1 g/dL   Globulin 2.9 1.9 - 3.7 g/dL (calc)   AG Ratio 1.5 1.0 - 2.5 (calc)   Total Bilirubin 0.4 0.2 - 1.2 mg/dL   Alkaline phosphatase (APISO) 48 35 - 144 U/L   AST 36 (H) 10 - 35 U/L   ALT 42 9 - 46 U/L  CBC with Differential/Platelet  Result Value Ref Range   WBC 4.4 3.8 - 10.8 Thousand/uL    RBC 4.66 4.20 - 5.80 Million/uL   Hemoglobin 12.1 (L) 13.2 - 17.1 g/dL   HCT 16.1 (L) 09.6 - 04.5 %   MCV 79.8 (L) 80.0 - 100.0 fL   MCH 26.0 (L) 27.0 - 33.0 pg   MCHC 32.5 32.0 - 36.0 g/dL   RDW 40.9 (H) 81.1 - 91.4 %   Platelets 199 140 - 400 Thousand/uL   MPV 11.2 7.5 - 12.5 fL   Neutro Abs 2,464 1,500 - 7,800 cells/uL   Lymphs Abs 1,245 850 - 3,900 cells/uL   Absolute Monocytes 537 200 - 950 cells/uL   Eosinophils Absolute 123 15 - 500 cells/uL   Basophils Absolute 31 0 - 200 cells/uL   Neutrophils Relative % 56 %   Total Lymphocyte 28.3 %   Monocytes Relative 12.2 %   Eosinophils Relative 2.8 %   Basophils Relative 0.7 %  Hemoglobin A1c  Result Value Ref Range   Hgb A1c MFr Bld 7.3 (H) <5.7 % of total Hgb   Mean Plasma Glucose 163 mg/dL   eAG (mmol/L) 9.0 mmol/L      Assessment & Plan:   Problem List Items Addressed This Visit    OSA on CPAP    Well controlled, chronic OSA on CPAP - Good adherence to CPAP nightly - Continue current CPAP therapy, patient seems to be benefiting from therapy  He may benefit from future repeat CPAP titration/sleep study as indicated by insurance for future medical supply orders. Last sleep study >10 year ago approx      Morbid obesity with BMI of 45.0-49.9, adult (HCC)    Weight improved Encourage lifestyle diet exercise wt loss      Relevant Medications   insulin lispro protamine-lispro (HUMALOG MIX 75/25) (75-25) 100 UNIT/ML SUSP injection   metFORMIN (GLUCOPHAGE) 1000 MG tablet   Hyperlipidemia associated with type 2 diabetes mellitus (HCC)    Controlled cholesterol on statin and zetia, with some limited lifestyle Last lipid panel 07/2020 Known CAD/PAD   Plan: 1. Continue current meds - Atorvastatin 80mg  daily, Zetia 10mg  daily 2. Continue ASA 81 and Plavix 75 DAPT for secondary ASCVD risk reduction 3. Encourage improved lifestyle - low carb/cholesterol, reduce portion size, continue improving regular exercise 4. Follow-up  yearly lipids      Relevant Medications   atorvastatin (LIPITOR) 80 MG tablet   ezetimibe (ZETIA) 10 MG tablet   insulin lispro protamine-lispro (HUMALOG MIX 75/25) (75-25) 100 UNIT/ML SUSP injection   lisinopril (ZESTRIL) 10 MG tablet   metFORMIN (GLUCOPHAGE) 1000 MG tablet   DM (diabetes mellitus), type 2 with peripheral vascular complications (HCC)    A1c up to 7.3, prior range 6.4 to 7.2 Overall still well controlled nearly at goal Intolerance to Trulicity - last time 3 month ago resumed insulin No episodes of hypoglycemia. Occasional hyperglycemia. Complications - vascular disease including CAD s/p CABG, PAD, among other including hyperlipidemia, morbid obesity, and OSA - increases risk of future cardiovascular complications and poor glucose control OFF Trulicity  Plan:  1. Continue Humalog mix 75/25  vials 17 BID - Continue Metformin 1000mg  BID 2. Encourage improved lifestyle - low carb, low sugar diet, reduce portion size, continue improving regular exercise 3. Check CBG, bring log to next visit for review 4. Continue ASA, ACEi, Statin      Relevant Medications   atorvastatin (LIPITOR) 80 MG tablet   glucose blood (ACCU-CHEK AVIVA PLUS) test strip   ezetimibe (ZETIA) 10 MG tablet   hydrochlorothiazide (HYDRODIURIL) 25 MG tablet   isosorbide mononitrate (IMDUR) 30 MG 24 hr tablet   insulin lispro protamine-lispro (HUMALOG MIX 75/25) (75-25) 100 UNIT/ML SUSP injection   lisinopril (ZESTRIL) 10 MG tablet   metFORMIN (GLUCOPHAGE) 1000 MG tablet   metoprolol succinate (TOPROL-XL) 50 MG 24 hr tablet   Chronic pain of left lower extremity   Relevant Medications   baclofen (LIORESAL) 10 MG tablet   CAD (coronary artery disease), native coronary artery    Followed by Cardiology On DAPT, med management      Relevant Medications   atorvastatin (LIPITOR) 80 MG tablet   ezetimibe (ZETIA) 10 MG tablet   hydrochlorothiazide (HYDRODIURIL) 25 MG tablet   isosorbide mononitrate  (IMDUR) 30 MG 24 hr tablet   lisinopril (ZESTRIL) 10 MG tablet   metoprolol succinate (TOPROL-XL) 50 MG 24 hr tablet   Atypical chest pain   Relevant Medications   isosorbide mononitrate (IMDUR) 30 MG 24 hr tablet    Other Visit Diagnoses    Annual physical exam    -  Primary   CAD, multiple vessel       Relevant Medications   atorvastatin (LIPITOR) 80 MG tablet   ezetimibe (ZETIA) 10 MG tablet   clopidogrel (PLAVIX) 75 MG tablet   hydrochlorothiazide (HYDRODIURIL) 25 MG tablet   isosorbide mononitrate (IMDUR) 30 MG 24 hr tablet   lisinopril (ZESTRIL) 10 MG tablet   metoprolol succinate (TOPROL-XL) 50 MG 24 hr tablet   Essential hypertension       Relevant Medications   atorvastatin (LIPITOR) 80 MG tablet   ezetimibe (ZETIA) 10 MG tablet   hydrochlorothiazide (HYDRODIURIL) 25 MG tablet   isosorbide mononitrate (IMDUR) 30 MG 24 hr tablet   lisinopril (ZESTRIL) 10 MG tablet   metoprolol succinate (TOPROL-XL) 50 MG 24 hr tablet   Type 2 diabetes mellitus with vascular disease (HCC)       Relevant Medications   atorvastatin (LIPITOR) 80 MG tablet   ezetimibe (ZETIA) 10 MG tablet   hydrochlorothiazide (HYDRODIURIL) 25 MG tablet   isosorbide mononitrate (IMDUR) 30 MG 24 hr tablet   insulin lispro protamine-lispro (HUMALOG MIX 75/25) (75-25) 100 UNIT/ML SUSP injection   lisinopril (ZESTRIL) 10 MG tablet   metFORMIN (GLUCOPHAGE) 1000 MG tablet   metoprolol succinate (TOPROL-XL) 50 MG 24 hr tablet      Updated Health Maintenance information - UTD Vaccines, COVID booster done. Flu shot done - Next Cologuard or Colon screening due 07/2022 Reviewed recent lab results with patient Encouraged improvement to lifestyle with diet and exercise - Goal of weight loss   Meds ordered this encounter  Medications  . atorvastatin (LIPITOR) 80 MG tablet    Sig: Take 1 tablet (80 mg total) by mouth daily.    Dispense:  90 tablet    Refill:  3    Keep refills on file if not ready yet  .  glucose blood (ACCU-CHEK AVIVA PLUS) test strip    Sig: Use to check blood sugar up to 3 x daily    Dispense:  100 each  Refill:  12    Dx E11.51  . ezetimibe (ZETIA) 10 MG tablet    Sig: Take 1 tablet (10 mg total) by mouth daily.    Dispense:  90 tablet    Refill:  3    Keep refills on file if not ready yet  . clopidogrel (PLAVIX) 75 MG tablet    Sig: Take 1 tablet (75 mg total) by mouth daily.    Dispense:  90 tablet    Refill:  3    Keep refills on file if not ready yet  . baclofen (LIORESAL) 10 MG tablet    Sig: Take 0.5-1 tablets (5-10 mg total) by mouth 2 (two) times daily as needed for muscle spasms (leg pain).    Dispense:  180 each    Refill:  1  . hydrochlorothiazide (HYDRODIURIL) 25 MG tablet    Sig: Take 1 tablet (25 mg total) by mouth daily.    Dispense:  90 tablet    Refill:  3    Keep refills on file if not ready yet  . isosorbide mononitrate (IMDUR) 30 MG 24 hr tablet    Sig: Take 1 tablet (30 mg total) by mouth daily.    Dispense:  90 tablet    Refill:  3    Keep refills on file if not ready yet  . insulin lispro protamine-lispro (HUMALOG MIX 75/25) (75-25) 100 UNIT/ML SUSP injection    Sig: INJECT 17 UNITS SUBCUTANEOUSLY TWICE DAILY WITH A MEAL.  MAX OF 50 UNITS DAILY    Dispense:  10 mL    Refill:  5    Keep refills on file if not ready yet  . lisinopril (ZESTRIL) 10 MG tablet    Sig: Take 1 tablet (10 mg total) by mouth daily.    Dispense:  90 tablet    Refill:  3    Keep refills on file if not ready yet  . metFORMIN (GLUCOPHAGE) 1000 MG tablet    Sig: TAKE 1 TABLET BY MOUTH TWICE DAILY WITH A MEAL    Dispense:  180 tablet    Refill:  3    Keep refills on file if not ready yet  . metoprolol succinate (TOPROL-XL) 50 MG 24 hr tablet    Sig: Take with or immediately following a meal.    Dispense:  90 tablet    Refill:  3    Keep refills on file if not ready yet      Follow up plan: Return in about 6 months (around 01/19/2021) for 6 month DM  A1c, leg pain.  Saralyn Pilar, DO [facility] 07/22/2020, 1:57 PM

## 2020-07-22 NOTE — Assessment & Plan Note (Signed)
Weight improved Encourage lifestyle diet exercise wt loss

## 2020-07-22 NOTE — Assessment & Plan Note (Signed)
Followed by Cardiology On DAPT, med management 

## 2020-07-22 NOTE — Assessment & Plan Note (Signed)
A1c up to 7.3, prior range 6.4 to 7.2 Overall still well controlled nearly at goal Intolerance to Trulicity - last time 3 month ago resumed insulin No episodes of hypoglycemia. Occasional hyperglycemia. Complications - vascular disease including CAD s/p CABG, PAD, among other including hyperlipidemia, morbid obesity, and OSA - increases risk of future cardiovascular complications and poor glucose control OFF Trulicity  Plan:  1. Continue Humalog mix 75/25 vials 17 BID - Continue Metformin 1000mg  BID 2. Encourage improved lifestyle - low carb, low sugar diet, reduce portion size, continue improving regular exercise 3. Check CBG, bring log to next visit for review 4. Continue ASA, ACEi, Statin

## 2020-07-22 NOTE — Assessment & Plan Note (Signed)
Well controlled, chronic OSA on CPAP - Good adherence to CPAP nightly - Continue current CPAP therapy, patient seems to be benefiting from therapy  He may benefit from future repeat CPAP titration/sleep study as indicated by insurance for future medical supply orders. Last sleep study >10 year ago approx

## 2020-07-22 NOTE — Assessment & Plan Note (Signed)
Controlled cholesterol on statin and zetia, with some limited lifestyle Last lipid panel 07/2020 Known CAD/PAD   Plan: 1. Continue current meds - Atorvastatin 80mg  daily, Zetia 10mg  daily 2. Continue ASA 81 and Plavix 75 DAPT for secondary ASCVD risk reduction 3. Encourage improved lifestyle - low carb/cholesterol, reduce portion size, continue improving regular exercise 4. Follow-up yearly lipids

## 2020-08-24 NOTE — Progress Notes (Signed)
Cardiology Office Note  Date:  08/25/2020   ID:  Roshawn, Ayala 1956/01/05, MRN [id number]  PCP:  Smitty Cords, DO   Chief Complaint  Patient presents with  . Follow-up    6 month F/U    HPI:  65 year old male with known history of  HTN,  Morbid obesity OSA not on CPAP,  CAD s/p 3 vessel CABG on 01/14/2015,  DM,  HLD  who presents for routine follow-up of his coronary artery disease [facility]  Last seen in clinic by myself  02/2019 Seen by one of our providers 02/2020  Lab work reviewed Hemoglobin A1c climbing 7.3 Total cholesterol 110 LDL 54  Stationary bike Weight continues to run high Chronic back pain, nerve pain left leg, for least the past 2 years Supposed to be referred to orthopedics he reports by primary care  Loves chocolate Potato intake  Reports having some erectile dysfunction issues,  low testosterone (results not seen in the computer) Wonders if he can take Viagra  Nerve pain left leg Pain with sitting   Reports blood pressure stable at home  EKG personally reviewed by myself on todays visit Shows normal sinus rhythm rate 89 bpm old anteroseptal MI Unable to exclude old inferior MI  Other past medical history presented to [facility] on 01/06/2015 with complaints of nausea, feeling hot, and chest pain with radiation into his left shoulder. EKG was unremarkable in ED and Cardiac enzymes were negative.  Echo on 7/4 showed EF 60-65%, no regional wall motion abnormalities, no valvular abnormalities, PASP normal, normal study.  He underwent nuclear stress test that showed large defect of moderate severity in the mid anterior, mid anteroseptal, mid anterolateral, apical anterior, apical lateral and apex locations. F   underwent cardiac catheterization which showed severe 2 vessel CAD (ost LAD 70%, mid LAD 99%, ost LCx to prox LCx 95% after the take off of OM1, mid LCx 80% prior to takeoff to OM2, normal LV function).It  was felt CABG would be indicated and the patient was transferred to [facility] for further care.   He underwent successful 3 vessel CABG (LIMA-LAD, SVG-OM, SVG-Ramus) on 01/14/2015. Post-op he developed Afib and converted on IV amiodarone. His A1C was poorly controlled coming in at 10.0%.    PMH:   has a past medical history of CAD (coronary artery disease), History of echocardiogram, Hyperlipidemia LDL goal <70, Hypertension, Morbid obesity (HCC), OSA (obstructive sleep apnea), and Type II diabetes mellitus (HCC).  PSH:    Past Surgical History:  Procedure Laterality Date  . CARDIAC CATHETERIZATION N/A 01/09/2015   Procedure: Left Heart Cath and Coronary Angiography;  Surgeon: Antonieta Iba, MD;  Location: [facility] INVASIVE CV LAB;  Service: Cardiovascular;  Laterality: N/A;  . CORONARY ARTERY BYPASS GRAFT N/A 01/14/2015   Procedure: CORONARY ARTERY BYPASS GRAFTING (CABG), ON PUMP, TIMES THREE, USING LEFT INTERNAL MAMMARY ARTERY, RIGHT GREATER SAPHENOUS VEIN HARVESTED ENDOSCOPICALLY;  Surgeon: Kerin Perna, MD;  Location: [facility] OR;  Service: Open Heart Surgery;  Laterality: N/A;  . HERNIA REPAIR    . TEE WITHOUT CARDIOVERSION N/A 01/14/2015   Procedure: TRANSESOPHAGEAL ECHOCARDIOGRAM (TEE);  Surgeon: Kerin Perna, MD;  Location: [facility] OR;  Service: Open Heart Surgery;  Laterality: N/A;    Current Outpatient Medications  Medication Sig Dispense Refill  . aspirin EC 81 MG tablet Take 1 tablet (81 mg total) by mouth daily. 90 tablet 3  . atorvastatin (LIPITOR) 80 MG tablet Take  1 tablet (80 mg total) by mouth daily. 90 tablet 3  . baclofen (LIORESAL) 10 MG tablet Take 0.5-1 tablets (5-10 mg total) by mouth 2 (two) times daily as needed for muscle spasms (leg pain). 180 each 1  . clopidogrel (PLAVIX) 75 MG tablet Take 1 tablet (75 mg total) by mouth daily. 90 tablet 3  . ezetimibe (ZETIA) 10 MG tablet Take 1 tablet (10 mg total) by mouth daily. 90 tablet 3  . glucose blood (ACCU-CHEK AVIVA  PLUS) test strip Use to check blood sugar up to 3 x daily 100 each 12  . hydrochlorothiazide (HYDRODIURIL) 25 MG tablet Take 1 tablet (25 mg total) by mouth daily. 90 tablet 3  . insulin lispro protamine-lispro (HUMALOG MIX 75/25) (75-25) 100 UNIT/ML SUSP injection INJECT 17 UNITS SUBCUTANEOUSLY TWICE DAILY WITH A MEAL.  MAX OF 50 UNITS DAILY 10 mL 5  . Insulin Syringe-Needle U-100 (BD VEO INSULIN SYRINGE U/F) 31G X 15/64" 0.3 ML MISC USE TWICE DAILY AS DIRECTED 100 each 1  . isosorbide mononitrate (IMDUR) 30 MG 24 hr tablet Take 1 tablet (30 mg total) by mouth daily. 90 tablet 3  . lisinopril (ZESTRIL) 10 MG tablet Take 1 tablet (10 mg total) by mouth daily. 90 tablet 3  . metFORMIN (GLUCOPHAGE) 1000 MG tablet TAKE 1 TABLET BY MOUTH TWICE DAILY WITH A MEAL 180 tablet 3  . metoprolol succinate (TOPROL-XL) 50 MG 24 hr tablet Take with or immediately following a meal. 90 tablet 3  . Syringe, Disposable, 3 ML MISC Use to inject humalog mix 75/25 17u BID 200 each 5   No current facility-administered medications for this visit.     Allergies:   Patient has no known allergies.   Social History:  The patient  reports that he quit smoking about 45 years ago. He has never used smokeless tobacco. He reports that he does not drink alcohol and does not use drugs.   Family History:   family history includes CAD in his brother and mother; Throat cancer in his father.    Review of Systems: Review of Systems  Constitutional: Negative.   Respiratory: Negative.   Cardiovascular: Negative.   Gastrointestinal: Negative.   Musculoskeletal: Positive for back pain.       Left leg nerve pain  Neurological: Negative.   Psychiatric/Behavioral: Negative.   All other systems reviewed and are negative.   PHYSICAL EXAM: VS:  BP 128/86 (BP Location: Left Arm, Patient Position: Sitting, Cuff Size: Large)   Pulse 89   Ht 5\' 8"  (1.727 m)   Wt (!) 321 lb (145.6 kg)   SpO2 96%   BMI 48.81 kg/m  , BMI Body mass  index is 48.81 kg/m. Constitutional:  oriented to person, place, and time. No distress.  Obese HENT:  Head: Grossly normal Eyes:  no discharge. No scleral icterus.  Neck: No JVD, no carotid bruits  Cardiovascular: Regular rate and rhythm, no murmurs appreciated Pulmonary/Chest: Clear to auscultation bilaterally, no wheezes or rails Abdominal: Soft.  no distension.  no tenderness.  Musculoskeletal: Normal range of motion Neurological:  normal muscle tone. Coordination normal. No atrophy Skin: Skin warm and dry Psychiatric: normal affect, pleasant   Recent Labs: 07/15/2020: ALT 42; BUN 16; Creat 0.85; Hemoglobin 12.1; Platelets 199; Potassium 4.3; Sodium 135    Lipid Panel Lab Results  Component Value Date   CHOL 110 07/15/2020   HDL 41 07/15/2020   LDLCALC 54 07/15/2020   TRIG 70 07/15/2020    Wt Readings  from Last 3 Encounters:  08/25/20 (!) 321 lb (145.6 kg)  07/22/20 (!) 328 lb 3.2 oz (148.9 kg)  04/18/20 (!) 329 lb (149.2 kg)     ASSESSMENT AND PLAN:  Coronary artery disease of bypass graft of native heart with stable angina pectoris (HCC) - Plan: EKG 12-Lead Currently with no symptoms of angina. No further workup at this time. Continue current medication regimen.  Mixed hyperlipidemia - Plan: EKG 12-Lead Cholesterol is at goal on the current lipid regimen. No changes to the medications were made.  Essential hypertension - Plan: EKG 12-Lead Blood pressure is well controlled on today's visit. No changes made to the medications.  Type 2 diabetes mellitus without complication, with long-term current use of insulin (HCC) - Plan: EKG 12-Lead  hemoglobin A 1C is trending upwards, weight high We have encouraged continued exercise, careful diet management in an effort to lose weight.  Morbid obesity (HCC) - Plan: EKG 12-Lead  Poor diet, recommended dietary changes, portion control We will cut back on his potatoes, chocolate Increase his protein  Erectile  dysfunction Reports having low testosterone, lab work not available We did discuss Viagra or Cialis If he is interested in taking Viagra we would need to stop the Imdur,  Changed to ARB/such as Benicar 40 (better for ED than a ACE inhibitor) He will talk with primary care to see if he needs to fix testosterone first before trying Viagra I also recommended weight loss    Total encounter time more than 25 minutes  Greater than 50% was spent in counseling and coordination of care with the patient   No orders of the defined types were placed in this encounter.    Signed, Dossie Arbour, M.D., Ph.D. 08/25/2020  [facility] [place], [place] [phone]

## 2020-08-25 ENCOUNTER — Other Ambulatory Visit: Payer: Self-pay

## 2020-08-25 ENCOUNTER — Encounter: Payer: Self-pay | Admitting: Cardiovascular Disease

## 2020-08-25 ENCOUNTER — Ambulatory Visit (INDEPENDENT_AMBULATORY_CARE_PROVIDER_SITE_OTHER): Payer: Medicaid Other | Admitting: Cardiovascular Disease

## 2020-08-25 VITALS — BP 128/86 | HR 89 | Ht 68.0 in | Wt 321.0 lb

## 2020-08-25 DIAGNOSIS — I10 Essential (primary) hypertension: Secondary | ICD-10-CM | POA: Diagnosis not present

## 2020-08-25 DIAGNOSIS — G4733 Obstructive sleep apnea (adult) (pediatric): Secondary | ICD-10-CM

## 2020-08-25 DIAGNOSIS — E1151 Type 2 diabetes mellitus with diabetic peripheral angiopathy without gangrene: Secondary | ICD-10-CM | POA: Diagnosis not present

## 2020-08-25 DIAGNOSIS — E785 Hyperlipidemia, unspecified: Secondary | ICD-10-CM | POA: Diagnosis not present

## 2020-08-25 DIAGNOSIS — I2510 Atherosclerotic heart disease of native coronary artery without angina pectoris: Secondary | ICD-10-CM | POA: Diagnosis not present

## 2020-08-25 NOTE — Patient Instructions (Signed)
Medication Instructions:  No changes  If you need a refill on your cardiac medications before your next appointment, please call your pharmacy.    Lab work: No new labs needed   If you have labs (blood work) drawn today and your tests are completely normal, you will receive your results only by: . MyChart Message (if you have MyChart) OR . A paper copy in the mail If you have any lab test that is abnormal or we need to change your treatment, we will call you to review the results.   Testing/Procedures: No new testing needed   Follow-Up: At [facility], you and your health needs are our priority.  As part of our continuing mission to provide you with exceptional heart care, we have created designated Provider Care Teams.  These Care Teams include your primary Cardiologist (physician) and Advanced Practice Providers (APPs -  Physician Assistants and Nurse Practitioners) who all work together to provide you with the care you need, when you need it.  . You will need a follow up appointment in 12 months  . Providers on your designated Care Team:   . Christopher Berge, NP . Ryan Dunn, PA-C . Jacquelyn Visser, PA-C  Any Other Special Instructions Will Be Listed Below (If Applicable).  COVID-19 Vaccine Information can be found at: [email or web address] For questions related to vaccine distribution or appointments, please email [email or web address] or call [phone].     

## 2020-10-02 ENCOUNTER — Other Ambulatory Visit: Payer: Self-pay | Admitting: Family Medicine

## 2020-10-02 DIAGNOSIS — E1151 Type 2 diabetes mellitus with diabetic peripheral angiopathy without gangrene: Secondary | ICD-10-CM

## 2020-12-02 ENCOUNTER — Other Ambulatory Visit: Payer: Self-pay | Admitting: Family Medicine

## 2020-12-02 DIAGNOSIS — E1151 Type 2 diabetes mellitus with diabetic peripheral angiopathy without gangrene: Secondary | ICD-10-CM

## 2020-12-02 NOTE — Telephone Encounter (Signed)
     Notes to clinic:  this script has expired  Review for continue refills   Requested Prescriptions  Pending Prescriptions Disp Refills   BD VEO INSULIN SYRINGE U/F 31G X 15/64" 0.3 ML MISC [Pharmacy Med Name: BD INS SYR 0.3/31G/6MM MIS] 100 each 0    Sig: USE AS DIRECTED TWICE DAILY      There is no refill protocol information for this order

## 2021-01-19 ENCOUNTER — Other Ambulatory Visit: Payer: Self-pay

## 2021-01-19 ENCOUNTER — Ambulatory Visit (INDEPENDENT_AMBULATORY_CARE_PROVIDER_SITE_OTHER): Payer: Medicaid Other | Admitting: Family Medicine

## 2021-01-19 ENCOUNTER — Encounter: Payer: Self-pay | Admitting: Family Medicine

## 2021-01-19 VITALS — BP 122/70 | HR 93 | Ht 68.0 in | Wt 318.2 lb

## 2021-01-19 DIAGNOSIS — E1151 Type 2 diabetes mellitus with diabetic peripheral angiopathy without gangrene: Secondary | ICD-10-CM

## 2021-01-19 DIAGNOSIS — R252 Cramp and spasm: Secondary | ICD-10-CM

## 2021-01-19 DIAGNOSIS — M79662 Pain in left lower leg: Secondary | ICD-10-CM | POA: Diagnosis not present

## 2021-01-19 LAB — POCT GLYCOSYLATED HEMOGLOBIN (HGB A1C): Hemoglobin A1C: 6.6 % — AB (ref 4.0–5.6)

## 2021-01-19 NOTE — Progress Notes (Signed)
Subjective:    Patient ID: Craig Harvey, male    DOB: [birth year], 65 y.o.   MRN: [id number]  Craig Harvey is a 65 y.o. male presenting on 01/19/2021 for Diabetes and Leg Pain   HPI  Here for Annual Physical and Lab Review   CHRONIC DM, Type 2: Failed Trulicity due to side effect intolerance Improved CBGs Meds: Metformin 1000mg  BID, Humalog Vials 75/25 - 17u BID wc Reports good compliance. Tolerating well w/o side-effects Currently on ACEi Lifestyle: - Diet (trying to improve diet - eliminated french fries, chocolate) UTD DM eye No significant hypoglycemia. Prior 1 or less a month, sometimes minimal, has CBG 90-100 with symptoms, but no significant low hypoglycemia  Left Low Calf Cramping Pain Left sided only, will have episodic symptoms with walking or also even at rest. Even if lay on L side can provoke it, improved if lay on R side. Chronic low back pain with some radiating symptoms.   Depression screen [facility] 2/9 01/19/2021 04/18/2020 01/09/2020  Decreased Interest 0 0 0  Down, Depressed, Hopeless 0 0 0  PHQ - 2 Score 0 0 0  Altered sleeping 0 - -  Tired, decreased energy 0 - -  Change in appetite 0 - -  Feeling bad or failure about yourself  1 - -  Trouble concentrating 0 - -  Moving slowly or fidgety/restless 0 - -  Suicidal thoughts - - -  PHQ-9 Score 1 - -  Difficult doing work/chores Not difficult at all - -    Social History   Tobacco Use   Smoking status: Former    Types: Cigarettes    Quit date: 08/06/1975    Years since quitting: 45.4   Smokeless tobacco: Never  Vaping Use   Vaping Use: Never used  Substance Use Topics   Alcohol use: No   Drug use: No    Review of Systems Per HPI unless specifically indicated above     Objective:    BP 122/70   Pulse 93   Ht 5\' 8"  (1.727 m)   Wt (!) 318 lb 3.2 oz (144.3 kg)   SpO2 96%   BMI 48.38 kg/m   Wt Readings from Last 3 Encounters:  01/19/21 (!) 318 lb 3.2 oz (144.3 kg)  08/25/20 (!) 321 lb  (145.6 kg)  07/22/20 (!) 328 lb 3.2 oz (148.9 kg)    Physical Exam Vitals and nursing note reviewed.  Constitutional:      General: He is not in acute distress.    Appearance: Normal appearance. He is well-developed. He is obese. He is not diaphoretic.     Comments: Well-appearing, comfortable, cooperative  HENT:     Head: Normocephalic and atraumatic.  Eyes:     General:        Right eye: No discharge.        Left eye: No discharge.     Conjunctiva/sclera: Conjunctivae normal.  Cardiovascular:     Rate and Rhythm: Normal rate.  Pulmonary:     Effort: Pulmonary effort is normal.  Musculoskeletal:     Comments: Left calf without tenderness today. Soft, normal appearing muscle.  Skin:    General: Skin is warm and dry.     Findings: No erythema or rash.  Neurological:     Mental Status: He is alert and oriented to person, place, and time.  Psychiatric:        Mood and Affect: Mood normal.        Behavior:  Behavior normal.        Thought Content: Thought content normal.     Comments: Well groomed, good eye contact, normal speech and thoughts    Diabetic Foot Exam - Simple   Simple Foot Form Diabetic Foot exam was performed with the following findings: Yes 01/19/2021  2:00 PM  Visual Inspection No deformities, no ulcerations, no other skin breakdown bilaterally: Yes Sensation Testing Intact to touch and monofilament testing bilaterally: Yes Pulse Check Posterior Tibialis and Dorsalis pulse intact bilaterally: Yes Comments      Recent Labs    04/18/20 1412 07/15/20 0803 01/19/21 1351  HGBA1C 6.7* 7.3* 6.6*     Results for orders placed or performed in visit on 01/19/21  POCT HgB A1C  Result Value Ref Range   Hemoglobin A1C 6.6 (A) 4.0 - 5.6 %      Assessment & Plan:   Problem List Items Addressed This Visit     Leg cramping   DM (diabetes mellitus), type 2 with peripheral vascular complications (HCC) - Primary    Improved A1c to 6.6 Overall still well  controlled nearly at goal No episodes of hypoglycemia. Occasional hyperglycemia. Complications - vascular disease including CAD s/p CABG, PAD, among other including hyperlipidemia, morbid obesity, and OSA - increases risk of future cardiovascular complications and poor glucose control OFF Trulicity (intolerance)  Plan:  1. Continue Humalog mix 75/25 vials 17 BID - Continue Metformin 1000mg  BID 2. Encourage improved lifestyle - low carb, low sugar diet, reduce portion size, continue improving regular exercise 3. Check CBG, bring log to next visit for review 4. Continue ASA, ACEi, Statin DM Foot check today DM Eye 02/2021       Relevant Orders   POCT HgB A1C (Completed)   Other Visit Diagnoses     Pain of left calf           LLE Pain / Calf Cramping Clinically with variable onset triggers for L localized pain. Seems to be provoked with activity, can be muscle cramp related based on history and description. No other red flags for persistent problem seems episodic and can resolve completely then return. Also symptoms at rest, may be more suggestive of lower extremity pain triggered by positional or flare of back, as known OA/DJD spine issue.   Trial Voltaren topical, Continue Baclofen PRN, OTC muscle cramp options If unresolved call back and we can try option of Gabapentin course or follow up with Neuro/Spine  No orders of the defined types were placed in this encounter.     Follow up plan: Return in about 6 months (around 07/22/2021) for 6 month Annual Physical AM (fasting lab AFTER).  07/24/2021, DO [facility] 01/19/2021, 1:50 PM

## 2021-01-19 NOTE — Assessment & Plan Note (Signed)
Improved A1c to 6.6 Overall still well controlled nearly at goal No episodes of hypoglycemia. Occasional hyperglycemia. Complications - vascular disease including CAD s/p CABG, PAD, among other including hyperlipidemia, morbid obesity, and OSA - increases risk of future cardiovascular complications and poor glucose control OFF Trulicity (intolerance)  Plan:  1. Continue Humalog mix 75/25 vials 17 BID - Continue Metformin 1000mg  BID 2. Encourage improved lifestyle - low carb, low sugar diet, reduce portion size, continue improving regular exercise 3. Check CBG, bring log to next visit for review 4. Continue ASA, ACEi, Statin DM Foot check today DM Eye 02/2021

## 2021-01-19 NOTE — Patient Instructions (Addendum)
Thank you for coming to the office today.  Recent Labs    04/18/20 1412 07/15/20 0803 01/19/21 1351  HGBA1C 6.7* 7.3* 6.6*    START anti inflammatory topical - OTC Voltaren (generic Diclofenac) topical 2-4 times a day as needed for pain swelling of affected joint for 1-2 weeks or longer.  Keep on Baclofen.  Leg cramps - Try spoonful of yellow mustard to relieve leg cramps or try daily to prevent the problem  - OTC natural option is Hyland's Leg Cramps (Dissolving tablet) take as needed for muscle cramps  It could be pinched nerve in back / hip that radiates into Lower leg.  Call back if not working we can try Gabapentin nerve pain pill.  DUE for FASTING BLOOD WORK (no food or drink after midnight before the lab appointment, only water or coffee without cream/sugar on the morning of) after visit   Please schedule a Follow-up Appointment to: Return in about 6 months (around 07/22/2021) for 6 month Annual Physical AM (fasting lab AFTER).  If you have any other questions or concerns, please feel free to call the office or send a message through MyChart. You may also schedule an earlier appointment if necessary.  Additionally, you may be receiving a survey about your experience at our office within a few days to 1 week by e-mail or mail. We value your feedback.  Saralyn Pilar, DO [facility], [place]

## 2021-01-24 ENCOUNTER — Other Ambulatory Visit: Payer: Self-pay | Admitting: Family Medicine

## 2021-01-24 DIAGNOSIS — E1151 Type 2 diabetes mellitus with diabetic peripheral angiopathy without gangrene: Secondary | ICD-10-CM

## 2021-01-24 NOTE — Telephone Encounter (Signed)
Requested Prescriptions  Pending Prescriptions Disp Refills  . BD VEO INSULIN SYRINGE U/F 31G X 15/64" 0.3 ML MISC [Pharmacy Med Name: BD INS SYR 0.3/31G/6MM MIS] 100 each 0    Sig: USE AS DIRECTED TWICE DAILY     There is no refill protocol information for this order

## 2021-03-10 ENCOUNTER — Other Ambulatory Visit: Payer: Self-pay | Admitting: Family Medicine

## 2021-03-10 DIAGNOSIS — E1151 Type 2 diabetes mellitus with diabetic peripheral angiopathy without gangrene: Secondary | ICD-10-CM

## 2021-03-10 NOTE — Telephone Encounter (Signed)
Requested medications are due for refill today.  yes  Requested medications are on the active medications list.  yes  Last refill. 01/04/2021  Future visit scheduled.   yes  Notes to clinic.  No protocol assigned.

## 2021-04-27 ENCOUNTER — Other Ambulatory Visit: Payer: Self-pay | Admitting: Family Medicine

## 2021-04-27 DIAGNOSIS — E1151 Type 2 diabetes mellitus with diabetic peripheral angiopathy without gangrene: Secondary | ICD-10-CM

## 2021-04-27 NOTE — Telephone Encounter (Signed)
Requested Prescriptions  Pending Prescriptions Disp Refills  . HUMALOG MIX 75/25 (75-25) 100 UNIT/ML SUSP injection [Pharmacy Med Name: HumaLOG Mix 75/25 (75-25) 100 UNIT/ML Subcutaneous Suspension] 10 mL 1    Sig: INJECT 17 UNITS SUBCUTANEOUSLY TWICE DAILY WITH A MEAL. MAX OF 50 UNITS DAILY.     Endocrinology:  Diabetes - Insulins Passed - 04/27/2021  3:17 PM      Passed - HBA1C is between 0 and 7.9 and within 180 days    Hemoglobin A1C  Date Value Ref Range Status  01/19/2021 6.6 (A) 4.0 - 5.6 % Final  04/18/2017 6.4  Final   Hgb A1c MFr Bld  Date Value Ref Range Status  07/15/2020 7.3 (H) <5.7 % of total Hgb Final    Comment:    For someone without known diabetes, a hemoglobin A1c value of 6.5% or greater indicates that they may have  diabetes and this should be confirmed with a follow-up  test. . For someone with known diabetes, a value <7% indicates  that their diabetes is well controlled and a value  greater than or equal to 7% indicates suboptimal  control. A1c targets should be individualized based on  duration of diabetes, age, comorbid conditions, and  other considerations. . Currently, no consensus exists regarding use of hemoglobin A1c for diagnosis of diabetes for children. Verna Czech - Valid encounter within last 6 months    Recent Outpatient Visits          3 months ago DM (diabetes mellitus), type 2 with peripheral vascular complications [facility])   [facility] Althea Charon, Netta Neat, DO   9 months ago Annual physical exam   [facility] [place], Netta Neat, DO   1 year ago DM (diabetes mellitus), type 2 with peripheral vascular complications [facility])   [facility] Smitty Cords, DO   1 year ago DM (diabetes mellitus), type 2 with peripheral vascular complications [facility])   [facility] Smitty Cords, DO   1 year ago Annual physical exam   [facility]  Smitty Cords, DO      Future Appointments            In 2 months Althea Charon, Netta Neat, DO [facility]           . BD VEO INSULIN SYRINGE U/F 31G X 15/64" 0.3 ML MISC [Pharmacy Med Name: BD INS SYR 0.3/31G/6MM MIS] 100 each 2    Sig: USE AS DIRECTED TWICE DAILY     There is no refill protocol information for this order

## 2021-04-29 ENCOUNTER — Telehealth: Payer: Self-pay | Admitting: Family Medicine

## 2021-04-29 NOTE — Telephone Encounter (Signed)
..   Medicaid Managed Care   Unsuccessful Outreach Note  04/29/2021 Name: Craig Harvey MRN: [id number] DOB: [birth year]  Referred by: Smitty Cords, DO Reason for referral : High Risk Managed Medicaid (I called the patient today to get him scheduled for a phone visit with the [facility] Team. I was not able to leave a message.)   An unsuccessful telephone outreach was attempted today. The patient was referred to the case management team for assistance with care management and care coordination.   Follow Up Plan: The care management team will reach out to the patient again over the next 7-14 days.   [name] Care Guide, High Risk Medicaid Managed Care Embedded Care Coordination [facility]  [facility]

## 2021-05-12 ENCOUNTER — Telehealth: Payer: Self-pay | Admitting: Family Medicine

## 2021-05-12 NOTE — Telephone Encounter (Signed)
..   Medicaid Managed Care   Unsuccessful Outreach Note  05/12/2021 Name: Craig Harvey MRN: [id number] DOB: 1956/07/03  Referred by: Smitty Cords, DO Reason for referral : High Risk Managed Medicaid (I called the patient today to get him scheduled for a phone visit with the [facility] Team. I left my name and number on his VM.)   A second unsuccessful telephone outreach was attempted today. The patient was referred to the case management team for assistance with care management and care coordination.   Follow Up Plan: The care management team will reach out to the patient again over the next 14 days.   [name] Care Guide, High Risk Medicaid Managed Care Embedded Care Coordination [facility]  [facility]

## 2021-05-20 ENCOUNTER — Other Ambulatory Visit: Payer: Self-pay

## 2021-05-20 NOTE — Patient Instructions (Addendum)
Visit Information  Craig Harvey was given information about Medicaid Managed Care team care coordination services as a part of their [place] Medicaid benefit. Craig Harvey verbally consented to engagement with the [facility] Managed Care team.   If you are experiencing a medical emergency, please call 911 or report to your local emergency department or urgent care.   If you have a non-emergency medical problem during routine business hours, please contact your provider's office and ask to speak with a nurse.   For questions related to your [facility], please call: [phone] or visit the homepage here: [email or web address]  If you would like to schedule transportation through your [facility], please call the following number at least 2 days in advance of your appointment: [phone].   Call the [place] at [phone], at any time, 24 hours a day, 7 days a week. If you are in danger or need immediate medical attention call 911.  If you would like help to quit smoking, call 1-800-QUIT-NOW 9186505339) OR Espaol: 1-855-Djelo-Ya (0-962-836-6294) o para ms informacin haga clic aqu or Text READY to 200-400 to register via text  Craig Harvey - following are the goals we discussed in your visit today:   Patient Goals/Self-Care Activities: Patient will attend all scheduled provider appointments as evidenced by clinician review of documented attendance to scheduled appointments and patient/caregiver report Patient will call pharmacy for medication refills as evidenced by patient report and review of pharmacy fill history as appropriate Patient will continue to perform ADL's independently as evidenced by patient/caregiver report Patient will continue to perform IADL's independently as evidenced by patient/caregiver report Patient will call  provider office for new concerns or questions as evidenced by review of documented incoming telephone call notes and patient report check blood sugar at prescribed times: 2 times per week before breakfast  take the blood sugar log to all doctor visits - check blood pressure weekly - keep a blood pressure log - take blood pressure log to all doctor appointments - call doctor for signs and symptoms of high blood pressure - keep all doctor appointments - take medications for blood pressure exactly as prescribed - report new symptoms to your doctor - call for medicine refill 2 or 3 days before it runs out  The Managed Medicaid care management team will reach out to the patient again over the next 30 days.  Please see education materials related to today initial visit provided as print materials.   The patient verbalized understanding of instructions provided today and agreed to receive a mailed copy of patient instruction and/or educational materials.  The Managed Medicaid care management team will reach out to the patient again over the next 30 days.   Craig Marvel RN, BSN Community Care Coordinator [facility] Mobile: [phone]   Following is a copy of your plan of care:  Care Plan : [place] of Care  Updates made by Inge Rise, RN since 05/20/2021 12:00 AM     Problem: Chronic Disease Management and Care Coordination Needs for DM, CAD, HLD, HTN   Priority: High     Long-Range Goal: Development of Plan of Care for Chronic Disease Management and Care Coordination Needs (DM, CAD, HLD, HTN)   Start Date: 05/20/2021  Expected End Date: 09/17/2021  Priority: High  Note:   Current Barriers:  Knowledge Deficits related to plan of care for management of CAD, HTN,  HLD, and DMII  Care Coordination needs related to Limited social support and Limited access to caregiver  Chronic Disease Management support and education needs related  to CAD, HTN, HLD, and DMII Lacks caregiver support.        No Advanced Directives in place  RNCM Clinical Goal(s):  Patient will verbalize understanding of plan for management of CAD, HTN, HLD, and DMII as evidenced by good management and control all patient's chronic diseases verbalize basic understanding of CAD, HTN, HLD, and DMII disease process and self health management plan as evidenced by self monitoring activities of DM and HTN take all medications exactly as prescribed and will call provider for medication related questions as evidenced by patient's medication compliance    attend all scheduled medical appointments: 07/23/2021 PCP office visit for 6 month follow up as evidenced by no missed appointments        demonstrate ongoing adherence to prescribed treatment plan for CAD, HTN, HLD, and DMII as evidenced by good management & control of these chronic diseases demonstrate ongoing health management independence as evidenced by good management and control of these chronic disease.        continue to work with Consulting civil engineer and/or Social Worker to address care management and care coordination needs related to CAD, HTN, HLD, and DMII as evidenced by adherence to CM Team Scheduled appointments     work with pharmacist to address Limited social support and Limited access to caregiver related to CAD, HTN, HLD, and DMII as evidenced by review of EMR and patient or pharmacist report    through collaboration with Consulting civil engineer, provider, and care team.   Interventions: Inter-disciplinary care team collaboration (see longitudinal plan of care) Evaluation of current treatment plan related to  self management and patient's adherence to plan as established by provider   CAD  (Status: New goal.) Long Term Goal  Assessed understanding of CAD diagnosis Medications reviewed including medications utilized in CAD treatment plan Provided education on importance of blood pressure control in management  of CAD; Provided education on Importance of limiting foods high in cholesterol; Counseled on the importance of exercise goals with target of 150 minutes per week Reviewed Importance of taking all medications as prescribed Reviewed Importance of attending all scheduled provider appointments Advised to report any changes in symptoms or exercise tolerance Screening for signs and symptoms of depression related to chronic disease state;  Assessed social determinant of health barriers;   Diabetes:  (Status: New goal.) Long Term Goal   Lab Results  Component Value Date   HGBA1C 6.6 (A) 01/19/2021  Assessed patient's understanding of A1c goal: <7% Reviewed medications with patient and discussed importance of medication adherence;        Reviewed prescribed diet with patient low carbohydrates and low sugar; Discussed plans with patient for ongoing care management follow up and provided patient with direct contact information for care management team;      Reviewed scheduled/upcoming provider appointments including: 07/23/2021 PCP Visit for 6 month follow up;         Advised patient, providing education and rationale, to check cbg 2 times per week as reccommended by PCP and record        call provider for findings outside established parameters;       Referral made to pharmacy team for assistance with complex medication regimen;       Screening for signs and symptoms of depression related to chronic disease state;  Assessed social determinant of health barriers;         Hyperlipidemia:  (Status: New goal.) Long Term Goal  Lab Results  Component Value Date   CHOL 110 07/15/2020   HDL 41 07/15/2020   LDLCALC 54 07/15/2020   TRIG 70 07/15/2020   CHOLHDL 2.7 07/15/2020     Medication review performed; medication list updated in electronic medical record.  Reviewed role and benefits of statin for ASCVD risk reduction; Reviewed importance of limiting foods high in cholesterol; Reviewed  exercise goals and target of 150 minutes per week; Screening for signs and symptoms of depression related to chronic disease state;  Assessed social determinant of health barriers;   Hypertension: (Status: New goal.) Last practice recorded BP readings:  BP Readings from Last 3 Encounters:  01/19/21 122/70  08/25/20 128/86  07/22/20 125/70  Most recent eGFR/CrCl: No results found for: EGFR  No components found for: CRCL  Evaluation of current treatment plan related to hypertension self management and patient's adherence to plan as established by provider;   Reviewed medications with patient and discussed importance of compliance;  Counseled on the importance of exercise goals with target of 150 minutes per week Discussed plans with patient for ongoing care management follow up and provided patient with direct contact information for care management team; Advised patient, providing education and rationale, to monitor blood pressure daily and record, calling PCP for findings outside established parameters;  Reviewed scheduled/upcoming provider appointments including:  Screening for signs and symptoms of depression related to chronic disease state;  Assessed social determinant of health barriers;   Patient Goals/Self-Care Activities: Patient will attend all scheduled provider appointments as evidenced by clinician review of documented attendance to scheduled appointments and patient/caregiver report Patient will call pharmacy for medication refills as evidenced by patient report and review of pharmacy fill history as appropriate Patient will continue to perform ADL's independently as evidenced by patient/caregiver report Patient will continue to perform IADL's independently as evidenced by patient/caregiver report Patient will call provider office for new concerns or questions as evidenced by review of documented incoming telephone call notes and patient report check blood sugar at prescribed  times: 2 times per week before breakfast  take the blood sugar log to all doctor visits - check blood pressure weekly - keep a blood pressure log - take blood pressure log to all doctor appointments - call doctor for signs and symptoms of high blood pressure - keep all doctor appointments - take medications for blood pressure exactly as prescribed - report new symptoms to your doctor - call for medicine refill 2 or 3 days before it runs out

## 2021-05-20 NOTE — Patient Outreach (Signed)
Medicaid Managed Care   Nurse Care Manager Note  05/20/2021 Name:  Craig Harvey MRN:  [id number] DOB:  [birth year]  Craig Harvey is an 65 y.o. year old male who is a primary patient of Craig Hauser, Harvey.  The [facility] Managed Care Coordination team was consulted for assistance with:    CAD HTN HLD DMII  Craig Harvey was given information about Medicaid Managed Care Coordination team services today. Craig Harvey Patient agreed to services and verbal consent obtained.  Engaged with patient by telephone for initial visit in response to provider referral for case management and/or care coordination services.   Assessments/Interventions:  Review of past medical history, allergies, medications, health status, including review of consultants reports, laboratory and other test data, was performed as part of comprehensive evaluation and provision of chronic care management services.  SDOH (Social Determinants of Health) assessments and interventions performed: SDOH Interventions    Flowsheet Row Most Recent Value  SDOH Interventions   Food Insecurity Interventions Intervention Not Indicated  Financial Strain Interventions Intervention Not Indicated  Housing Interventions Intervention Not Indicated  Physical Activity Interventions Intervention Not Indicated  Stress Interventions Intervention Not Indicated  Social Connections Interventions Intervention Not Indicated  Transportation Interventions Intervention Not Indicated       Care Plan  No Known Allergies  Medications Reviewed Today     Reviewed by Craig Rise, RN (Case Manager) on 05/20/21 at Craig Harvey List Status: <None>   Medication Order Taking? Sig Documenting Provider Last Dose Status Informant  aspirin EC 81 MG tablet [id number]  Take 1 tablet (81 mg total) by mouth daily. Craig Harvey  Active   atorvastatin (LIPITOR) 80 MG tablet [id number]  Take 1 tablet (80 mg total) by mouth daily.  Craig Harvey, Craig Doughty, Harvey  Active   baclofen (LIORESAL) 10 MG tablet [id number]  Take 0.5-1 tablets (5-10 mg total) by mouth 2 (two) times daily as needed for muscle spasms (leg pain). Craig Hauser, Harvey  Active   BD VEO INSULIN SYRINGE U/F 31G X 15/64" 0.3 ML MISC [id number]  USE AS DIRECTED TWICE DAILY Craig Hauser, Harvey  Active   clopidogrel (PLAVIX) 75 MG tablet [id number]  Take 1 tablet (75 mg total) by mouth daily. Craig Hauser, Harvey  Active   ezetimibe (ZETIA) 10 MG tablet [id number]  Take 1 tablet (10 mg total) by mouth daily. Craig Hauser, Harvey  Active   glucose blood (ACCU-CHEK AVIVA PLUS) test strip [id number]  Use to check blood sugar up to 3 x daily Craig Hauser, Harvey  Active   HUMALOG MIX 75/25 (75-25) 100 UNIT/ML SUSP injection [id number]  INJECT 17 UNITS SUBCUTANEOUSLY TWICE DAILY WITH A MEAL. MAX OF 50 UNITS DAILY. Craig Hauser, Harvey  Active   hydrochlorothiazide (HYDRODIURIL) 25 MG tablet [id number]  Take 1 tablet (25 mg total) by mouth daily. Craig Harvey, Craig Doughty, Harvey  Active   isosorbide mononitrate (IMDUR) 30 MG 24 hr tablet [id number]  Take 1 tablet (30 mg total) by mouth daily. Craig Harvey, Craig Doughty, Harvey  Active   lisinopril (ZESTRIL) 10 MG tablet [id number]  Take 1 tablet (10 mg total) by mouth daily. Craig Hauser, Harvey  Active   metFORMIN (GLUCOPHAGE) 1000 MG tablet [id number]  TAKE 1 TABLET BY MOUTH TWICE DAILY WITH A MEAL Craig Harvey, Craig Doughty, Harvey  Active   metoprolol succinate (TOPROL-XL) 50 MG 24 hr tablet [id number]  Take with or immediately following a meal. Craig Harvey  Craig Harvey  Active   Syringe, Disposable, 3 ML MISC [id number]  Use to inject humalog mix 75/25 17u BID Craig Hauser, Harvey  Active             Patient Active Problem List   Diagnosis Date Noted   Leg cramping 01/09/2020   CAD (coronary artery disease), native coronary artery 01/22/2018   Spondylosis of lumbar  region without myelopathy or radiculopathy 10/26/2017   Chronic pain of left lower extremity 10/26/2017   Hypertension    Hyperlipidemia associated with type 2 diabetes mellitus ([place])    S/P CABG x 3 01/14/2015   Atypical chest pain 01/09/2015   DM (diabetes mellitus), type 2 with peripheral vascular complications (HCC)    OSA on CPAP    Morbid obesity with BMI of 45.0-49.9, adult ([place])     Conditions to be addressed/monitored per PCP order:  CAD, HTN, HLD, and DMII  Care Plan : RN Care Manager Plan of Care  Updates made by Craig Rise, RN since 05/20/2021 12:00 AM     Problem: Chronic Disease Management and Care Coordination Needs for DM, CAD, HLD, HTN   Priority: High     Long-Range Goal: Development of Plan of Care for Chronic Disease Management and Care Coordination Needs (DM, CAD, HLD, HTN)   Start Date: 05/20/2021  Expected End Date: 09/17/2021  Priority: High  Note:   Current Barriers:  Knowledge Deficits related to plan of care for management of CAD, HTN, HLD, and DMII  Care Coordination needs related to Limited social support and Limited access to caregiver  Chronic Disease Management support and education needs related to CAD, HTN, HLD, and DMII Lacks caregiver support.        No Advanced Directives in place  RNCM Clinical Goal(s):  Patient will verbalize understanding of plan for management of CAD, HTN, HLD, and DMII as evidenced by good management and control all patient's chronic diseases verbalize basic understanding of CAD, HTN, HLD, and DMII disease process and self health management plan as evidenced by self monitoring activities of DM and HTN take all medications exactly as prescribed and will call provider for medication related questions as evidenced by patient's medication compliance    attend all scheduled medical appointments: 07/23/2021 PCP office visit for 6 month follow up as evidenced by no missed appointments        demonstrate ongoing  adherence to prescribed treatment plan for CAD, HTN, HLD, and DMII as evidenced by good management & control of these chronic diseases demonstrate ongoing health management independence as evidenced by good management and control of these chronic disease.        continue to work with Consulting civil engineer and/or Social Worker to address care management and care coordination needs related to CAD, HTN, HLD, and DMII as evidenced by adherence to CM Team Scheduled appointments     work with pharmacist to address Limited social support and Limited access to caregiver related to CAD, HTN, HLD, and DMII as evidenced by review of EMR and patient or pharmacist report    through collaboration with Consulting civil engineer, provider, and care team.   Interventions: Inter-disciplinary care team collaboration (see longitudinal plan of care) Evaluation of current treatment plan related to  self management and patient's adherence to plan as established by provider   CAD  (Status: New goal.) Long Term Goal  Assessed understanding of CAD diagnosis Medications reviewed including medications utilized in CAD treatment plan Provided education on importance of  blood pressure control in management of CAD; Provided education on Importance of limiting foods high in cholesterol; Counseled on the importance of exercise goals with target of 150 minutes per week Reviewed Importance of taking all medications as prescribed Reviewed Importance of attending all scheduled provider appointments Advised to report any changes in symptoms or exercise tolerance Screening for signs and symptoms of depression related to chronic disease state;  Assessed social determinant of health barriers;   Diabetes:  (Status: New goal.) Long Term Goal   Lab Results  Component Value Date   HGBA1C 6.6 (A) 01/19/2021  Assessed patient's understanding of A1c goal: <7% Reviewed medications with patient and discussed importance of medication adherence;         Reviewed prescribed diet with patient low carbohydrates and low sugar; Discussed plans with patient for ongoing care management follow up and provided patient with direct contact information for care management team;      Reviewed scheduled/upcoming provider appointments including: 07/23/2021 PCP Visit for 6 month follow up;         Advised patient, providing education and rationale, to check cbg 2 times per week as reccommended by PCP and record        call provider for findings outside established parameters;       Referral made to pharmacy team for assistance with complex medication regimen;       Screening for signs and symptoms of depression related to chronic disease state;        Assessed social determinant of health barriers;         Hyperlipidemia:  (Status: New goal.) Long Term Goal  Lab Results  Component Value Date   CHOL 110 07/15/2020   HDL 41 07/15/2020   LDLCALC 54 07/15/2020   TRIG 70 07/15/2020   CHOLHDL 2.7 07/15/2020     Medication review performed; medication list updated in electronic medical record.  Reviewed role and benefits of statin for ASCVD risk reduction; Reviewed importance of limiting foods high in cholesterol; Reviewed exercise goals and target of 150 minutes per week; Screening for signs and symptoms of depression related to chronic disease state;  Assessed social determinant of health barriers;   Hypertension: (Status: New goal.) Last practice recorded BP readings:  BP Readings from Last 3 Encounters:  01/19/21 122/70  08/25/20 128/86  07/22/20 125/70  Most recent eGFR/CrCl: No results found for: EGFR  No components found for: CRCL  Evaluation of current treatment plan related to hypertension self management and patient's adherence to plan as established by provider;   Reviewed medications with patient and discussed importance of compliance;  Counseled on the importance of exercise goals with target of 150 minutes per week Discussed plans with  patient for ongoing care management follow up and provided patient with direct contact information for care management team; Advised patient, providing education and rationale, to monitor blood pressure daily and record, calling PCP for findings outside established parameters;  Reviewed scheduled/upcoming provider appointments including:  Screening for signs and symptoms of depression related to chronic disease state;  Assessed social determinant of health barriers;   Patient Goals/Self-Care Activities: Patient will attend all scheduled provider appointments as evidenced by clinician review of documented attendance to scheduled appointments and patient/caregiver report Patient will call pharmacy for medication refills as evidenced by patient report and review of pharmacy fill history as appropriate Patient will continue to perform ADL's independently as evidenced by patient/caregiver report Patient will continue to perform IADL's independently as evidenced by patient/caregiver report  Patient will call provider office for new concerns or questions as evidenced by review of documented incoming telephone call notes and patient report check blood sugar at prescribed times: 2 times per week before breakfast  take the blood sugar log to all doctor visits - check blood pressure weekly - keep a blood pressure log - take blood pressure log to all doctor appointments - call doctor for signs and symptoms of high blood pressure - keep all doctor appointments - take medications for blood pressure exactly as prescribed - report new symptoms to your doctor - call for medicine refill 2 or 3 days before it runs out       Follow Up:  Patient agrees to Care Plan and Follow-up.  Plan: The Managed Medicaid care management team will reach out to the patient again over the next 30 days.  Date/time of next scheduled RN care management/care coordination outreach:  06/17/2021 at 3:00 PM  [place],  [facility] Mobile: [phone]

## 2021-05-21 ENCOUNTER — Telehealth: Payer: Self-pay | Admitting: Family Medicine

## 2021-05-21 NOTE — Telephone Encounter (Signed)
..   Medicaid Managed Care   Unsuccessful Outreach Note  05/21/2021 Name: Craig Harvey MRN: [id number] DOB: 14-Oct-1955  Referred by: Smitty Cords, DO Reason for referral : High Risk Managed Medicaid (I called the patient today to get him scheduled with the [facility] pharmacist. I left him my name and number.)   An unsuccessful telephone outreach was attempted today. The patient was referred to the case management team for assistance with care management and care coordination.   Follow Up Plan: The care management team will reach out to the patient again over the next 7 days.   [name] Care Guide, High Risk Medicaid Managed Care Embedded Care Coordination [facility]  [facility]

## 2021-06-02 ENCOUNTER — Telehealth: Payer: Self-pay | Admitting: Family Medicine

## 2021-06-17 ENCOUNTER — Other Ambulatory Visit: Payer: Self-pay

## 2021-06-17 ENCOUNTER — Other Ambulatory Visit: Payer: Self-pay | Admitting: Family Medicine

## 2021-06-17 DIAGNOSIS — E1151 Type 2 diabetes mellitus with diabetic peripheral angiopathy without gangrene: Secondary | ICD-10-CM

## 2021-06-17 NOTE — Patient Outreach (Signed)
Medicaid Managed Care   Nurse Care Manager Note  06/17/2021 Name:  Craig Harvey MRN:  [id number] DOB:  1955-10-24  Craig Harvey is an 65 y.o. year old male who is a primary patient of Craig Hauser, DO.  The [facility] Managed Care Coordination team was consulted for assistance with:    CAD HTN HLD DMII  [name] was given information about Medicaid Managed Care Coordination team services today. Craig Harvey Patient agreed to services and verbal consent obtained.  Engaged with patient by telephone for follow up visit in response to provider referral for case management and/or care coordination services.   Assessments/Interventions:  Review of past medical history, allergies, medications, health status, including review of consultants reports, laboratory and other test data, was performed as part of comprehensive evaluation and provision of chronic care management services.  SDOH (Social Determinants of Health) assessments and interventions performed:   Care Plan  No Known Allergies  Medications Reviewed Today     Reviewed by Inge Rise, RN (Case Manager) on 06/17/21 at 1520  Med List Status: <None>   Medication Order Taking? Sig Documenting Provider Last Dose Status Informant  aspirin EC 81 MG tablet [id number] No Take 1 tablet (81 mg total) by mouth daily. Craig Merritts, MD Taking Active   atorvastatin (LIPITOR) 80 MG tablet [id number] No Take 1 tablet (80 mg total) by mouth daily. Craig Hauser, DO Taking Active   baclofen (LIORESAL) 10 MG tablet [id number] No Take 0.5-1 tablets (5-10 mg total) by mouth 2 (two) times daily as needed for muscle spasms (leg pain). Craig Hauser, DO Taking Active   BD VEO INSULIN SYRINGE U/F 31G X 15/64" 0.3 ML MISC [id number] No USE AS DIRECTED TWICE DAILY [employer] Craig Doughty, DO Taking Active   clopidogrel (PLAVIX) 75 MG tablet [id number] No Take 1 tablet (75 mg total) by mouth daily.  Craig Hauser, DO Taking Active   ezetimibe (ZETIA) 10 MG tablet [id number] No Take 1 tablet (10 mg total) by mouth daily. Craig Hauser, DO Taking Active   glucose blood (ACCU-CHEK AVIVA PLUS) test strip [id number] No Use to check blood sugar up to 3 x daily Craig Hauser, DO Taking Active   HUMALOG MIX 75/25 (75-25) 100 UNIT/ML SUSP injection [id number] No INJECT 17 UNITS SUBCUTANEOUSLY TWICE DAILY WITH A MEAL. MAX OF 50 UNITS DAILY. Craig Hauser, DO Taking Active   hydrochlorothiazide (HYDRODIURIL) 25 MG tablet [id number] No Take 1 tablet (25 mg total) by mouth daily. Craig Hauser, DO Taking Active   isosorbide mononitrate (IMDUR) 30 MG 24 hr tablet [id number] No Take 1 tablet (30 mg total) by mouth daily. Craig Hauser, DO Taking Active   lisinopril (ZESTRIL) 10 MG tablet [id number] No Take 1 tablet (10 mg total) by mouth daily. Craig Hauser, DO Taking Active   metFORMIN (GLUCOPHAGE) 1000 MG tablet [id number] No TAKE 1 TABLET BY MOUTH TWICE DAILY WITH A MEAL Craig Harvey, Craig Doughty, DO Taking Active   metoprolol succinate (TOPROL-XL) 50 MG 24 hr tablet [id number] No Take with or immediately following a meal. Craig Hauser, DO Taking Active   Syringe, Disposable, 3 ML MISC [id number] No Use to inject humalog mix 75/25 17u BID Craig Hauser, DO Taking Active             Patient Active Problem List   Diagnosis Date Noted   Leg cramping 01/09/2020   CAD (coronary artery disease), native coronary artery 01/22/2018  Spondylosis of lumbar region without myelopathy or radiculopathy 10/26/2017   Chronic pain of left lower extremity 10/26/2017   Hypertension    Hyperlipidemia associated with type 2 diabetes mellitus ([place])    S/P CABG x 3 01/14/2015   Atypical chest pain 01/09/2015   DM (diabetes mellitus), type 2 with peripheral vascular complications (H[facility])    OSA on CPAP    Morbid obesity with BMI  of 45.0-49.9, adult ([place])     Conditions to be addressed/monitored per PCP order:  CAD, HTN, HLD, and DMII  Care Plan : RN Care Manager Plan of Care  Updates made by Inge Rise, RN since 06/17/2021 12:00 AM     Problem: Chronic Disease Management and Care Coordination Needs for DM, CAD, HLD, HTN   Priority: High     Long-Range Goal: Development of Plan of Care for Chronic Disease Management and Care Coordination Needs (DM, CAD, HLD, HTN)   Start Date: 05/20/2021  Expected End Date: 09/17/2021  Priority: High  Note:   Current Barriers:  Knowledge Deficits related to plan of care for management of CAD, HTN, HLD, and DMII  Care Coordination needs related to Limited social support and Limited access to caregiver  Chronic Disease Management support and education needs related to CAD, HTN, HLD, and DMII Lacks caregiver support.        No Advanced Directives in place  RNCM Clinical Goal(s):  Patient will verbalize understanding of plan for management of CAD, HTN, HLD, and DMII as evidenced by good management and control all patient's chronic diseases verbalize basic understanding of CAD, HTN, HLD, and DMII disease process and self health management plan as evidenced by self monitoring activities of DM and HTN take all medications exactly as prescribed and will call provider for medication related questions as evidenced by patient's medication compliance    attend all scheduled medical appointments: 06/30/21 [facility] Pharmacist; 07/23/2021 PCP office visit for 6 month follow up as evidenced by no missed appointments        demonstrate ongoing adherence to prescribed treatment plan for CAD, HTN, HLD, and DMII as evidenced by good management & control of these chronic diseases demonstrate ongoing health management independence as evidenced by good management and control of these chronic disease.        continue to work with Consulting civil engineer and/or Social Worker to address care management and  care coordination needs related to CAD, HTN, HLD, and DMII as evidenced by adherence to CM Team Scheduled appointments     work with pharmacist to address Limited social support and Limited access to caregiver related to CAD, HTN, HLD, and DMII as evidenced by review of EMR and patient or pharmacist report    through collaboration with Consulting civil engineer, provider, and care team.   Interventions: Inter-disciplinary care team collaboration (see longitudinal plan of care) Evaluation of current treatment plan related to  self management and patient's adherence to plan as established by provider   CAD  (Status: Goal on Track (progressing): YES.) Long Term Goal  Assessed understanding of CAD diagnosis Medications reviewed including medications utilized in CAD treatment plan Provided education on Importance of limiting foods high in cholesterol; Counseled on the importance of exercise goals with target of 150 minutes per week Reviewed Importance of taking all medications as prescribed Reviewed Importance of attending all scheduled provider appointments Advised to report any changes in symptoms or exercise tolerance Discussed the importance of getting a scale to weigh himself weekly.  Patient thinks  he is losing weight due to using stationary bike daily.  Diabetes:  (Status: Goal on Track (progressing): YES.) Long Term Goal   Lab Results  Component Value Date   HGBA1C 6.6 (A) 01/19/2021  Assessed patient's understanding of A1c goal: <7% Reviewed medications with patient and discussed importance of medication adherence;        Reviewed prescribed diet with patient low carbohydrates and low sugar; Discussed plans with patient for ongoing care management follow up and provided patient with direct contact information for care management team;      Reviewed scheduled/upcoming provider appointments including: 06/30/21 [facility] Pharmacist; 07/23/2021 PCP Visit for 6 month follow up;         Advised patient,  providing education and rationale, to check cbg Fasting blood sugar 2 times/week and PRN  and record        call provider for findings outside established parameters;       Referral made to pharmacy team for assistance with complex medication regimen - telephone appointment scheduled for 06/30/21;       Patient reports most recent fasting blood sugar of 119 and non-fasting blood sugar of 151 after dinner and dessert (including chocolates and ice cream).  Hyperlipidemia:  (Status: Goal on Track (progressing): YES.) Long Term Goal  Lab Results  Component Value Date   CHOL 110 07/15/2020   HDL 41 07/15/2020   LDLCALC 54 07/15/2020   TRIG 70 07/15/2020   CHOLHDL 2.7 07/15/2020  Medication review performed; .  Reviewed importance of limiting foods high in cholesterol; Reviewed exercise goals and target of 150 minutes per week; Patient reports he is riding his stationary bike for 20 minutes daily which Is about 3.5 miles daily.  Hypertension: (Status: Goal on Track (progressing): YES.) Last practice recorded BP readings:  BP Readings from Last 3 Encounters:  01/19/21 122/70  08/25/20 128/86  07/22/20 125/70  Most recent eGFR/CrCl: No results found for: EGFR  No components found for: CRCL  Evaluation of current treatment plan related to hypertension self management and patient's adherence to plan as established by provider;   Reviewed medications with patient and discussed importance of compliance;  Counseled on the importance of exercise goals with target of 150 minutes per week Discussed plans with patient for ongoing care management follow up and provided patient with direct contact information for care management team; Reviewed scheduled/upcoming provider appointments including:   Patient Goals/Self-Care Activities: Patient will attend all scheduled provider appointments as evidenced by clinician review of documented attendance to scheduled appointments and patient/caregiver  report Patient will call pharmacy for medication refills as evidenced by patient report and review of pharmacy fill history as appropriate Patient will continue to perform ADL's independently as evidenced by patient/caregiver report Patient will continue to perform IADL's independently as evidenced by patient/caregiver report Patient will call provider office for new concerns or questions as evidenced by review of documented incoming telephone call notes and patient report check blood sugar at prescribed times: 2 times per week before breakfast  take the blood sugar log to all doctor visits - check blood pressure weekly - keep a blood pressure log - take blood pressure log to all doctor appointments - call doctor for signs and symptoms of high blood pressure - keep all doctor appointments - take medications for blood pressure exactly as prescribed - report new symptoms to your doctor - call for medicine refill 2 or 3 days before it runs out       Follow Up:  Patient agrees to Care Plan and Follow-up.  Plan: The Managed Medicaid care management team will reach out to the patient again over the next 45 days.  Date/time of next scheduled RN care management/care coordination outreach:  January 24,2023 at 2:00 PM  [place] [place] [facility] Mobile: [phone]

## 2021-06-17 NOTE — Patient Instructions (Signed)
Visit Information  Craig Harvey was given information about Medicaid Managed Care team care coordination services as a part of their [place] Medicaid benefit. Craig Harvey verbally consented to engagement with the [facility] Managed Care team.   If you are experiencing a medical emergency, please call 911 or report to your local emergency department or urgent care.   If you have a non-emergency medical problem during routine business hours, please contact your provider's office and ask to speak with a nurse.   For questions related to your [facility], please call: [phone] or visit the homepage here: [email or web address]  If you would like to schedule transportation through your [facility], please call the following number at least 2 days in advance of your appointment: [phone].   Call the [place] at [phone], at any time, 24 hours a day, 7 days a week. If you are in danger or need immediate medical attention call 911.  If you would like help to quit smoking, call 1-800-QUIT-NOW 9892405952) OR Espaol: 1-855-Djelo-Ya (0-263-785-8850) o para ms informacin haga clic aqu or Text READY to 200-400 to register via text  Craig Harvey - following are the goals we discussed in your visit today:  Goals Addressed:  Please see Patient Goals under [place] of Care below.  Please see education materials related to today's visit provided as print materials.   The patient verbalized understanding of instructions provided today and agreed to receive a mailed copy of patient instruction and/or educational materials.  The Managed Medicaid care management team will reach out to the patient again over the next 45 days.   Craig Marvel RN, BSN Community Care Coordinator [place] [facility] Mobile:  [phone]   Following is a copy of your plan of care:  Care Plan : [place] of Care  Updates made by Inge Rise, RN since 06/17/2021 12:00 AM     Problem: Chronic Disease Management and Care Coordination Needs for DM, CAD, HLD, HTN   Priority: High     Long-Range Goal: Development of Plan of Care for Chronic Disease Management and Care Coordination Needs (DM, CAD, HLD, HTN)   Start Date: 05/20/2021  Expected End Date: 09/17/2021  Priority: High  Note:   Current Barriers:  Knowledge Deficits related to plan of care for management of CAD, HTN, HLD, and DMII  Care Coordination needs related to Limited social support and Limited access to caregiver  Chronic Disease Management support and education needs related to CAD, HTN, HLD, and DMII Lacks caregiver support.        No Advanced Directives in place  RNCM Clinical Goal(s):  Patient will verbalize understanding of plan for management of CAD, HTN, HLD, and DMII as evidenced by good management and control all patient's chronic diseases verbalize basic understanding of CAD, HTN, HLD, and DMII disease process and self health management plan as evidenced by self monitoring activities of DM and HTN take all medications exactly as prescribed and will call provider for medication related questions as evidenced by patient's medication compliance    attend all scheduled medical appointments: 06/30/21 [facility] Pharmacist; 07/23/2021 PCP office visit for 6 month follow up as evidenced by no missed appointments        demonstrate ongoing adherence to prescribed treatment plan for CAD, HTN, HLD, and DMII as evidenced by good management & control of these chronic diseases demonstrate ongoing health  management independence as evidenced by good management and control of these chronic disease.        continue to work with Consulting civil engineer and/or Social Worker to address care management and care coordination needs related to CAD, HTN, HLD,  and DMII as evidenced by adherence to CM Team Scheduled appointments     work with pharmacist to address Limited social support and Limited access to caregiver related to CAD, HTN, HLD, and DMII as evidenced by review of EMR and patient or pharmacist report    through collaboration with Consulting civil engineer, provider, and care team.   Interventions: Inter-disciplinary care team collaboration (see longitudinal plan of care) Evaluation of current treatment plan related to  self management and patient's adherence to plan as established by provider   CAD  (Status: Goal on Track (progressing): YES.) Long Term Goal  Assessed understanding of CAD diagnosis Medications reviewed including medications utilized in CAD treatment plan Provided education on Importance of limiting foods high in cholesterol; Counseled on the importance of exercise goals with target of 150 minutes per week Reviewed Importance of taking all medications as prescribed Reviewed Importance of attending all scheduled provider appointments Advised to report any changes in symptoms or exercise tolerance Discussed the importance of getting a scale to weigh himself weekly.  Patient thinks he is losing weight due to using stationary bike daily.  Diabetes:  (Status: Goal on Track (progressing): YES.) Long Term Goal   Lab Results  Component Value Date   HGBA1C 6.6 (A) 01/19/2021  Assessed patient's understanding of A1c goal: <7% Reviewed medications with patient and discussed importance of medication adherence;        Reviewed prescribed diet with patient low carbohydrates and low sugar; Discussed plans with patient for ongoing care management follow up and provided patient with direct contact information for care management team;      Reviewed scheduled/upcoming provider appointments including: 06/30/21 [facility] Pharmacist; 07/23/2021 PCP Visit for 6 month follow up;         Advised patient, providing education and rationale, to check cbg  Fasting blood sugar 2 times/week and PRN  and record        call provider for findings outside established parameters;       Referral made to pharmacy team for assistance with complex medication regimen - telephone appointment scheduled for 06/30/21;       Patient reports most recent fasting blood sugar of 119 and non-fasting blood sugar of 151 after dinner and dessert (including chocolates and ice cream).  Hyperlipidemia:  (Status: Goal on Track (progressing): YES.) Long Term Goal  Lab Results  Component Value Date   CHOL 110 07/15/2020   HDL 41 07/15/2020   LDLCALC 54 07/15/2020   TRIG 70 07/15/2020   CHOLHDL 2.7 07/15/2020  Medication review performed; .  Reviewed importance of limiting foods high in cholesterol; Reviewed exercise goals and target of 150 minutes per week; Patient reports he is riding his stationary bike for 20 minutes daily which Is about 3.5 miles daily.  Hypertension: (Status: Goal on Track (progressing): YES.) Last practice recorded BP readings:  BP Readings from Last 3 Encounters:  01/19/21 122/70  08/25/20 128/86  07/22/20 125/70  Most recent eGFR/CrCl: No results found for: EGFR  No components found for: CRCL  Evaluation of current treatment plan related to hypertension self management and patient's adherence to plan as established by provider;   Reviewed medications with patient and discussed importance of compliance;  Counseled on the importance  of exercise goals with target of 150 minutes per week Discussed plans with patient for ongoing care management follow up and provided patient with direct contact information for care management team; Reviewed scheduled/upcoming provider appointments including:   Patient Goals/Self-Care Activities: Patient will attend all scheduled provider appointments as evidenced by clinician review of documented attendance to scheduled appointments and patient/caregiver report Patient will call pharmacy for medication refills  as evidenced by patient report and review of pharmacy fill history as appropriate Patient will continue to perform ADL's independently as evidenced by patient/caregiver report Patient will continue to perform IADL's independently as evidenced by patient/caregiver report Patient will call provider office for new concerns or questions as evidenced by review of documented incoming telephone call notes and patient report check blood sugar at prescribed times: 2 times per week before breakfast  take the blood sugar log to all doctor visits - check blood pressure weekly - keep a blood pressure log - take blood pressure log to all doctor appointments - call doctor for signs and symptoms of high blood pressure - keep all doctor appointments - take medications for blood pressure exactly as prescribed - report new symptoms to your doctor - call for medicine refill 2 or 3 days before it runs out

## 2021-06-18 NOTE — Telephone Encounter (Signed)
Requested Prescriptions  Pending Prescriptions Disp Refills   HUMALOG MIX 75/25 (75-25) 100 UNIT/ML SUSP injection [Pharmacy Med Name: HumaLOG Mix 75/25 (75-25) 100 UNIT/ML Subcutaneous Suspension] 10 mL 0    Sig: INJECT 17 UNITS SUBCUTANEOUSLY TWICE DAILY WITH A MEAL. MAX OF 50 UNITS DAILY.     Endocrinology:  Diabetes - Insulins Passed - 06/17/2021  5:18 PM      Passed - HBA1C is between 0 and 7.9 and within 180 days    Hemoglobin A1C  Date Value Ref Range Status  01/19/2021 6.6 (A) 4.0 - 5.6 % Final  04/18/2017 6.4  Final   Hgb A1c MFr Bld  Date Value Ref Range Status  07/15/2020 7.3 (H) <5.7 % of total Hgb Final    Comment:    For someone without known diabetes, a hemoglobin A1c value of 6.5% or greater indicates that they may have  diabetes and this should be confirmed with a follow-up  test. . For someone with known diabetes, a value <7% indicates  that their diabetes is well controlled and a value  greater than or equal to 7% indicates suboptimal  control. A1c targets should be individualized based on  duration of diabetes, age, comorbid conditions, and  other considerations. . Currently, no consensus exists regarding use of hemoglobin A1c for diagnosis of diabetes for children. Verna Czech - Valid encounter within last 6 months    Recent Outpatient Visits          5 months ago DM (diabetes mellitus), type 2 with peripheral vascular complications [facility])   [facility] Smitty Cords, DO   11 months ago Annual physical exam   [facility] [place], Netta Neat, DO   1 year ago DM (diabetes mellitus), type 2 with peripheral vascular complications [facility])   [facility] Smitty Cords, DO   1 year ago DM (diabetes mellitus), type 2 with peripheral vascular complications [facility])   [facility] Smitty Cords, DO   1 year ago Annual physical exam   [facility] Smitty Cords, DO      Future Appointments            In 1 month Althea Charon, Netta Neat, DO [facility], [facility]

## 2021-06-22 ENCOUNTER — Other Ambulatory Visit: Payer: Self-pay | Admitting: Family Medicine

## 2021-06-22 DIAGNOSIS — E1151 Type 2 diabetes mellitus with diabetic peripheral angiopathy without gangrene: Secondary | ICD-10-CM

## 2021-06-23 NOTE — Telephone Encounter (Signed)
Requested Prescriptions  Refused Prescriptions Disp Refills   HUMALOG MIX 75/25 (75-25) 100 UNIT/ML SUSP injection [Pharmacy Med Name: HumaLOG Mix 75/25 (75-25) 100 UNIT/ML Subcutaneous Suspension] 10 mL 0    Sig: INJECT 17 UNITS SUBCUTANEOUSLY TWICE DAILY WITH A MEAL. MAX OF 50 UNITS DAILY.     Endocrinology:  Diabetes - Insulins Passed - 06/22/2021  2:30 PM      Passed - HBA1C is between 0 and 7.9 and within 180 days    Hemoglobin A1C  Date Value Ref Range Status  01/19/2021 6.6 (A) 4.0 - 5.6 % Final  04/18/2017 6.4  Final   Hgb A1c MFr Bld  Date Value Ref Range Status  07/15/2020 7.3 (H) <5.7 % of total Hgb Final    Comment:    For someone without known diabetes, a hemoglobin A1c value of 6.5% or greater indicates that they may have  diabetes and this should be confirmed with a follow-up  test. . For someone with known diabetes, a value <7% indicates  that their diabetes is well controlled and a value  greater than or equal to 7% indicates suboptimal  control. A1c targets should be individualized based on  duration of diabetes, age, comorbid conditions, and  other considerations. . Currently, no consensus exists regarding use of hemoglobin A1c for diagnosis of diabetes for children. Verna Czech - Valid encounter within last 6 months    Recent Outpatient Visits          5 months ago DM (diabetes mellitus), type 2 with peripheral vascular complications [facility])   [facility] Smitty Cords, DO   11 months ago Annual physical exam   [facility] [place], Netta Neat, DO   1 year ago DM (diabetes mellitus), type 2 with peripheral vascular complications [facility])   [facility] Smitty Cords, DO   1 year ago DM (diabetes mellitus), type 2 with peripheral vascular complications [facility])   [facility] Smitty Cords, DO   2 years ago Annual physical exam   [facility] Smitty Cords, DO      Future Appointments            In 1 month Althea Charon, Netta Neat, DO [facility], [facility]

## 2021-06-30 ENCOUNTER — Other Ambulatory Visit: Payer: Self-pay

## 2021-06-30 ENCOUNTER — Telehealth: Payer: Self-pay | Admitting: Pharmacist

## 2021-06-30 NOTE — Patient Outreach (Signed)
06/30/2021 Name: Craig Harvey MRN: [id number] DOB: 20-May-1956  Referred by: Smitty Cords, DO Reason for referral : No chief complaint on file.   An unsuccessful telephone outreach was attempted today. The patient was referred to the case management team for assistance with care management and care coordination.    Follow Up Plan: The Managed Medicaid care management team will reach out to the patient again over the next 14 days.    Cheral Almas PharmD, CPP High Risk Managed Medicaid [place] [phone]

## 2021-07-15 ENCOUNTER — Ambulatory Visit: Payer: Medicaid Other

## 2021-07-23 ENCOUNTER — Ambulatory Visit: Payer: Medicaid Other | Admitting: Family Medicine

## 2021-07-23 ENCOUNTER — Encounter: Payer: Self-pay | Admitting: Family Medicine

## 2021-07-23 ENCOUNTER — Other Ambulatory Visit: Payer: Self-pay

## 2021-07-23 VITALS — BP 124/78 | HR 70 | Ht 68.0 in | Wt 299.8 lb

## 2021-07-23 DIAGNOSIS — Z0000 Encounter for general adult medical examination without abnormal findings: Secondary | ICD-10-CM | POA: Diagnosis not present

## 2021-07-23 DIAGNOSIS — E1151 Type 2 diabetes mellitus with diabetic peripheral angiopathy without gangrene: Secondary | ICD-10-CM | POA: Diagnosis not present

## 2021-07-23 DIAGNOSIS — E785 Hyperlipidemia, unspecified: Secondary | ICD-10-CM | POA: Diagnosis not present

## 2021-07-23 DIAGNOSIS — I2510 Atherosclerotic heart disease of native coronary artery without angina pectoris: Secondary | ICD-10-CM

## 2021-07-23 DIAGNOSIS — E1169 Type 2 diabetes mellitus with other specified complication: Secondary | ICD-10-CM | POA: Diagnosis not present

## 2021-07-23 DIAGNOSIS — Z23 Encounter for immunization: Secondary | ICD-10-CM | POA: Diagnosis not present

## 2021-07-23 DIAGNOSIS — I10 Essential (primary) hypertension: Secondary | ICD-10-CM | POA: Diagnosis not present

## 2021-07-23 DIAGNOSIS — Z125 Encounter for screening for malignant neoplasm of prostate: Secondary | ICD-10-CM

## 2021-07-23 DIAGNOSIS — Z9989 Dependence on other enabling machines and devices: Secondary | ICD-10-CM

## 2021-07-23 DIAGNOSIS — G4733 Obstructive sleep apnea (adult) (pediatric): Secondary | ICD-10-CM

## 2021-07-23 DIAGNOSIS — E1159 Type 2 diabetes mellitus with other circulatory complications: Secondary | ICD-10-CM

## 2021-07-23 DIAGNOSIS — Z6841 Body Mass Index (BMI) 40.0 and over, adult: Secondary | ICD-10-CM | POA: Diagnosis not present

## 2021-07-23 DIAGNOSIS — I25118 Atherosclerotic heart disease of native coronary artery with other forms of angina pectoris: Secondary | ICD-10-CM

## 2021-07-23 MED ORDER — METFORMIN HCL 1000 MG PO TABS: ORAL_TABLET | ORAL | 3 refills | Status: DC

## 2021-07-23 MED ORDER — ISOSORBIDE MONONITRATE ER 30 MG PO TB24: 30.0000 mg | ORAL_TABLET | Freq: Every day | ORAL | 3 refills | Status: DC

## 2021-07-23 MED ORDER — EZETIMIBE 10 MG PO TABS: 10.0000 mg | ORAL_TABLET | Freq: Every day | ORAL | 3 refills | Status: DC

## 2021-07-23 MED ORDER — CLOPIDOGREL BISULFATE 75 MG PO TABS: 75.0000 mg | ORAL_TABLET | Freq: Every day | ORAL | 3 refills | Status: DC

## 2021-07-23 MED ORDER — METOPROLOL SUCCINATE ER 50 MG PO TB24: ORAL_TABLET | ORAL | 3 refills | Status: DC

## 2021-07-23 MED ORDER — LISINOPRIL 10 MG PO TABS: 10.0000 mg | ORAL_TABLET | Freq: Every day | ORAL | 3 refills | Status: DC

## 2021-07-23 MED ORDER — HUMALOG MIX 75/25 (75-25) 100 UNIT/ML ~~LOC~~ SUSP: SUBCUTANEOUS | 3 refills | Status: DC

## 2021-07-23 MED ORDER — HYDROCHLOROTHIAZIDE 25 MG PO TABS: 25.0000 mg | ORAL_TABLET | Freq: Every day | ORAL | 3 refills | Status: DC

## 2021-07-23 NOTE — Assessment & Plan Note (Signed)
Followed by Cardiology On DAPT, med management 

## 2021-07-23 NOTE — Assessment & Plan Note (Addendum)
Controlled HTN Complication with PAD (CAD, CABG)    Plan:  1. Continue current BP regimen - Lisinopril 10mg  daily, Metoprolol XL 50mg  daily, HCTZ 25mg  daily, Imdur 30mg  daily 2. Encourage improved lifestyle - low sodium diet, regular exercise 3. monitor BP outside office, bring readings to next visit, if persistently >140/90 or new symptoms notify office sooner  Future reduce HCTZ if indicated, rare low BP

## 2021-07-23 NOTE — Progress Notes (Signed)
Subjective:    Patient ID: Craig Harvey, male    DOB: 07-Oct-1955, 66 y.o.   MRN: [id number]  Craig Harvey is a 66 y.o. male presenting on 07/23/2021 for Annual Exam   HPI  Here for Annual Physical and Due for fasting lab orders.  All med refills except atorva and baclofen   CHRONIC DM, Type 2: Failed Trulicity due to side effect intolerance Improved CBGs 90-150 range Meds: Metformin 1000mg  BID, Humalog Vials 75/25 - 17u BID wc Asks for larger supply of insulin and if he can lower dose gradually Reports good compliance. Tolerating well w/o side-effects Currently on ACEi Lifestyle: - Diet (trying to improve diet - eliminated french fries, chocolate) UTD DM eye No significant hypoglycemia. Prior 1 or less a month, sometimes minimal, has CBG 90-100 with symptoms, but no significant low hypoglycemia   Left Low Calf Cramping Pain Left sided only, will have episodic symptoms with walking or also even at rest. Even if lay on L side can provoke it, improved if lay on R side. Chronic low back pain with some radiating symptoms.  HYPERLIPIDEMIA / Morbid Obesity BMI >49 - Reports no concerns. Last lipid 07/2020, controlled  Due for lipid - Currently taking Atorvastatin 80mg , Zetia 10mg , tolerating well without side effects or myalgias - weight improved. Mild Elevated LFTs normalized.    OSA, on CPAP - Patient reports prior history of dx OSA and on CPAP years. Last sleep study >10 year ago - Today reports that sleep apnea is well controlled.  uses the CPAP machine every night. Tolerates the machine well, and thinks that sleeps better with it and feels good. No new concerns or symptoms.   CAD / Vascular Disease / S/p CABG Followed by [facility] Cardiology Dr Rockey Situ. history of CABG 01/2015 He continues on DAPT with Plavix 75 and ASA 81 He remains chest pain free. Continues on BB, Imdur, Statin, Zetia    Health Maintenance: Due for Flu Shot, will receive today   Depression screen  [facility] 2/9 07/23/2021 05/20/2021 01/19/2021  Decreased Interest 0 0 0  Down, Depressed, Hopeless 0 0 0  PHQ - 2 Score 0 0 0  Altered sleeping 0 - 0  Tired, decreased energy 0 - 0  Change in appetite 0 - 0  Feeling bad or failure about yourself  0 - 1  Trouble concentrating 0 - 0  Moving slowly or fidgety/restless 0 - 0  Suicidal thoughts 0 - -  PHQ-9 Score 0 - 1  Difficult doing work/chores Somewhat difficult - Not difficult at all    Past Medical History:  Diagnosis Date   CAD (coronary artery disease)    a. 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 cardiac cath 01/09/2015: ostLAD 70:, mLAD 99%, ost to pLCx 95%, mLCx 80%, LVEF nl, recommend CABGl; c. s/p 3v CABG 01/14/15 LIMA-LAD, SVG-OM, SVG-Ramus   History of echocardiogram    a. 01/2015 Echo: EF 60-65%, no rwma, nl RV fxn. Nl PASP.   Hyperlipidemia LDL goal <70    Hypertension    Morbid obesity (HCC)    OSA (obstructive sleep apnea)    Type II diabetes mellitus ([place])    Past Surgical History:  Procedure Laterality Date   CARDIAC CATHETERIZATION N/A 01/09/2015   Procedure: Left Heart Cath and Coronary Angiography;  Surgeon:  Minna Merritts, MD;  Location: [place] [facility];  Service: Cardiovascular;  Laterality: N/A;   CORONARY ARTERY BYPASS GRAFT N/A 01/14/2015   Procedure: CORONARY ARTERY BYPASS GRAFTING (CABG), ON PUMP, TIMES THREE, USING LEFT INTERNAL MAMMARY ARTERY, RIGHT GREATER SAPHENOUS VEIN HARVESTED ENDOSCOPICALLY;  Surgeon: Ivin Poot, MD;  Location: [place];  Service: Open Heart Surgery;  Laterality: N/A;   HERNIA REPAIR     TEE WITHOUT CARDIOVERSION N/A 01/14/2015   Procedure: TRANSESOPHAGEAL ECHOCARDIOGRAM (TEE);  Surgeon: Ivin Poot, MD;  Location: [place];  Service: Open Heart Surgery;  Laterality: N/A;   Social History    Socioeconomic History   Marital status: Single    Spouse name: Not on file   Number of children: Not on file   Years of education: Not on file   Highest education level: Not on file  Occupational History   Occupation: Former Administrator / Patent attorney  Tobacco Use   Smoking status: Former    Types: Cigarettes    Quit date: 08/06/1975    Years since quitting: 45.9   Smokeless tobacco: Never  Vaping Use   Vaping Use: Never used  Substance and Sexual Activity   Alcohol use: No   Drug use: No   Sexual activity: Not on file  Other Topics Concern   Not on file  Social History Narrative   Not on file   Social Determinants of Health   Financial Resource Strain: Low Risk    Difficulty of Paying Living Expenses: Not hard at all  Food Insecurity: No Food Insecurity   Worried About Charity fundraiser in the Last Year: Never true   [place] in the Last Year: Never true  Transportation Needs: No Transportation Needs   Lack of Transportation (Medical): No   Lack of Transportation (Non-Medical): No  Physical Activity: Sufficiently Active   Days of Exercise per Week: 4 days   Minutes of Exercise per Session: 40 min  Stress: No Stress Concern Present   Feeling of Stress : Not at all  Social Connections: Socially Isolated   Frequency of Communication with Friends and Family: Three times a week   Frequency of Social Gatherings with Friends and Family: Three times a week   Attends Religious Services: Never   Active Member of Clubs or Organizations: No   Attends Archivist Meetings: Never   Marital Status: Never married  Human resources officer Violence: Not on file   Family History  Problem Relation Age of Onset   CAD Mother    CAD Brother    Throat cancer Father    Diabetes Neg Hx    Cancer Neg Hx    Prostate cancer Neg Hx    Colon cancer Neg Hx    Current Outpatient Medications on File Prior to Visit  Medication Sig   aspirin EC 81 MG tablet Take 1 tablet  (81 mg total) by mouth daily.   atorvastatin (LIPITOR) 80 MG tablet Take 1 tablet (80 mg total) by mouth daily.   baclofen (LIORESAL) 10 MG tablet Take 0.5-1 tablets (5-10 mg total) by mouth 2 (two) times daily as needed for muscle spasms (leg pain).   BD VEO INSULIN SYRINGE U/F 31G X 15/64" 0.3 ML MISC USE AS DIRECTED TWICE DAILY   glucose blood (ACCU-CHEK AVIVA PLUS) test strip Use to check blood sugar up to 3 x daily   Syringe, Disposable, 3 ML MISC Use to inject humalog mix 75/25 17u BID  No current facility-administered medications on file prior to visit.    Review of Systems  Constitutional:  Negative for activity change, appetite change, chills, diaphoresis, fatigue and fever.  HENT:  Negative for congestion and hearing loss.   Eyes:  Negative for visual disturbance.  Respiratory:  Negative for cough, chest tightness, shortness of breath and wheezing.   Cardiovascular:  Negative for chest pain, palpitations and leg swelling.  Gastrointestinal:  Negative for abdominal pain, constipation, diarrhea, nausea and vomiting.  Genitourinary:  Negative for dysuria, frequency and hematuria.  Musculoskeletal:  Negative for arthralgias and neck pain.  Skin:  Negative for rash.  Neurological:  Negative for dizziness, weakness, light-headedness, numbness and headaches.  Hematological:  Negative for adenopathy.  Psychiatric/Behavioral:  Negative for behavioral problems, dysphoric mood and sleep disturbance.   Per HPI unless specifically indicated above      Objective:    BP 124/78 (BP Location: Left Arm, Cuff Size: Normal)    Pulse 70    Ht 5\' 8"  (1.727 m)    Wt 299 lb 12.8 oz (136 kg)    SpO2 97%    BMI 45.58 kg/m   Wt Readings from Last 3 Encounters:  07/23/21 299 lb 12.8 oz (136 kg)  01/19/21 (!) 318 lb 3.2 oz (144.3 kg)  08/25/20 (!) 321 lb (145.6 kg)    Physical Exam Vitals and nursing note reviewed.  Constitutional:      General: He is not in acute distress.    Appearance: He is  well-developed. He is obese. He is not diaphoretic.     Comments: Well-appearing, comfortable, cooperative  HENT:     Head: Normocephalic and atraumatic.  Eyes:     General:        Right eye: No discharge.        Left eye: No discharge.     Conjunctiva/sclera: Conjunctivae normal.     Pupils: Pupils are equal, round, and reactive to light.  Neck:     Thyroid: No thyromegaly.     Vascular: No carotid bruit.  Cardiovascular:     Rate and Rhythm: Normal rate and regular rhythm.     Pulses: Normal pulses.     Heart sounds: Normal heart sounds. No murmur heard. Pulmonary:     Effort: Pulmonary effort is normal. No respiratory distress.     Breath sounds: Normal breath sounds. No wheezing or rales.  Abdominal:     General: Bowel sounds are normal. There is no distension.     Palpations: Abdomen is soft. There is no mass.     Tenderness: There is no abdominal tenderness.  Musculoskeletal:        General: No tenderness. Normal range of motion.     Cervical back: Normal range of motion and neck supple.     Right lower leg: No edema.     Left lower leg: No edema.     Comments: Upper / Lower Extremities: - Normal muscle tone, strength bilateral upper extremities 5/5, lower extremities 5/5  Lymphadenopathy:     Cervical: No cervical adenopathy.  Skin:    General: Skin is warm and dry.     Findings: No erythema or rash.  Neurological:     Mental Status: He is alert and oriented to person, place, and time.     Comments: Distal sensation intact to light touch all extremities  Psychiatric:        Mood and Affect: Mood normal.        Behavior: Behavior normal.  Thought Content: Thought content normal.     Comments: Well groomed, good eye contact, normal speech and thoughts     Results for orders placed or performed in visit on 01/19/21  POCT HgB A1C  Result Value Ref Range   Hemoglobin A1C 6.6 (A) 4.0 - 5.6 %      Assessment & Plan:   Problem List Items Addressed This  Visit     OSA on CPAP    Well controlled, chronic OSA on CPAP - Good adherence to CPAP nightly - Continue current CPAP therapy, patient seems to be benefiting from therapy  He may benefit from future repeat CPAP titration/sleep study as indicated by insurance for future medical supply orders. Last sleep study >10 year ago approx      Morbid obesity with BMI of 45.0-49.9, adult (HCC)    Weight improved Encourage lifestyle diet exercise wt loss      Relevant Medications   insulin lispro protamine-lispro (HUMALOG MIX 75/25) (75-25) 100 UNIT/ML SUSP injection   metFORMIN (GLUCOPHAGE) 1000 MG tablet   Other Relevant Orders   COMPLETE METABOLIC PANEL WITH GFR   Lipid panel   CBC with Differential/Platelet   Hypertension    Controlled HTN Complication with PAD (CAD, CABG)    Plan:  1. Continue current BP regimen - Lisinopril 10mg  daily, Metoprolol XL 50mg  daily, HCTZ 25mg  daily, Imdur 30mg  daily 2. Encourage improved lifestyle - low sodium diet, regular exercise 3. monitor BP outside office, bring readings to next visit, if persistently >140/90 or new symptoms notify office sooner  Future reduce HCTZ if indicated, rare low BP      Relevant Medications   ezetimibe (ZETIA) 10 MG tablet   hydrochlorothiazide (HYDRODIURIL) 25 MG tablet   isosorbide mononitrate (IMDUR) 30 MG 24 hr tablet   lisinopril (ZESTRIL) 10 MG tablet   metoprolol succinate (TOPROL-XL) 50 MG 24 hr tablet   Other Relevant Orders   COMPLETE METABOLIC PANEL WITH GFR   Lipid panel   CBC with Differential/Platelet   Hyperlipidemia associated with type 2 diabetes mellitus (HCC)    Controlled cholesterol on statin and zetia, with some limited lifestyle Last lipid panel 07/2020 due for repeat today Known CAD/PAD   Plan: 1. Continue current meds - Atorvastatin 80mg  daily, Zetia 10mg  daily 2. Continue ASA 81 and Plavix 75 DAPT for secondary ASCVD risk reduction 3. Encourage improved lifestyle - low  carb/cholesterol, reduce portion size, continue improving regular exercise 4. Follow-up yearly lipids      Relevant Medications   insulin lispro protamine-lispro (HUMALOG MIX 75/25) (75-25) 100 UNIT/ML SUSP injection   ezetimibe (ZETIA) 10 MG tablet   hydrochlorothiazide (HYDRODIURIL) 25 MG tablet   isosorbide mononitrate (IMDUR) 30 MG 24 hr tablet   lisinopril (ZESTRIL) 10 MG tablet   metFORMIN (GLUCOPHAGE) 1000 MG tablet   metoprolol succinate (TOPROL-XL) 50 MG 24 hr tablet   Other Relevant Orders   Lipid panel   DM (diabetes mellitus), type 2 with peripheral vascular complications (HCC)    Improved A1c to 6.6 previously due today for A1c Overall still well controlled nearly at goal No episodes of hypoglycemia. Occasional hyperglycemia. Complications - vascular disease including CAD s/p CABG, PAD, among other including hyperlipidemia, morbid obesity, and OSA - increases risk of future cardiovascular complications and poor glucose control OFF Trulicity (intolerance)  Plan:  1. Continue Humalog mix 75/25 vials 17 BID order 90 day, taper down if fasting CBG avg < 150, reduce by 1 unit BID, stop around 10 unit -  Continue Metformin 1000mg  BID 2. Encourage improved lifestyle - low carb, low sugar diet, reduce portion size, continue improving regular exercise 3. Check CBG, bring log to next visit for review 4. Continue ASA, ACEi, Statin DM Eye 02/2021      Relevant Medications   insulin lispro protamine-lispro (HUMALOG MIX 75/25) (75-25) 100 UNIT/ML SUSP injection   ezetimibe (ZETIA) 10 MG tablet   hydrochlorothiazide (HYDRODIURIL) 25 MG tablet   isosorbide mononitrate (IMDUR) 30 MG 24 hr tablet   lisinopril (ZESTRIL) 10 MG tablet   metFORMIN (GLUCOPHAGE) 1000 MG tablet   metoprolol succinate (TOPROL-XL) 50 MG 24 hr tablet   Other Relevant Orders   Hemoglobin A1c   CAD (coronary artery disease), native coronary artery    Followed by Cardiology On DAPT, med management       Relevant Medications   ezetimibe (ZETIA) 10 MG tablet   hydrochlorothiazide (HYDRODIURIL) 25 MG tablet   isosorbide mononitrate (IMDUR) 30 MG 24 hr tablet   lisinopril (ZESTRIL) 10 MG tablet   metoprolol succinate (TOPROL-XL) 50 MG 24 hr tablet   Other Visit Diagnoses     Annual physical exam    -  Primary   Relevant Orders   COMPLETE METABOLIC PANEL WITH GFR   Lipid panel   CBC with Differential/Platelet   Needs flu shot       Relevant Orders   Flu Vaccine QUAD 65mo+IM (Fluarix, Fluzone & Alfiuria Quad PF) (Completed)   Screening for prostate cancer       Relevant Orders   PSA   CAD, multiple vessel       Relevant Medications   clopidogrel (PLAVIX) 75 MG tablet   ezetimibe (ZETIA) 10 MG tablet   hydrochlorothiazide (HYDRODIURIL) 25 MG tablet   isosorbide mononitrate (IMDUR) 30 MG 24 hr tablet   lisinopril (ZESTRIL) 10 MG tablet   metoprolol succinate (TOPROL-XL) 50 MG 24 hr tablet   Essential hypertension       Relevant Medications   ezetimibe (ZETIA) 10 MG tablet   hydrochlorothiazide (HYDRODIURIL) 25 MG tablet   isosorbide mononitrate (IMDUR) 30 MG 24 hr tablet   lisinopril (ZESTRIL) 10 MG tablet   metoprolol succinate (TOPROL-XL) 50 MG 24 hr tablet   Type 2 diabetes mellitus with vascular disease (HCC)       Relevant Medications   insulin lispro protamine-lispro (HUMALOG MIX 75/25) (75-25) 100 UNIT/ML SUSP injection   ezetimibe (ZETIA) 10 MG tablet   hydrochlorothiazide (HYDRODIURIL) 25 MG tablet   isosorbide mononitrate (IMDUR) 30 MG 24 hr tablet   lisinopril (ZESTRIL) 10 MG tablet   metFORMIN (GLUCOPHAGE) 1000 MG tablet   metoprolol succinate (TOPROL-XL) 50 MG 24 hr tablet       Updated Health Maintenance information Flu Shot today Future age 18+ PCV20 and Shingrix Fasting labs today Encouraged improvement to lifestyle with diet and exercise Goal of weight loss   Meds ordered this encounter  Medications   insulin lispro protamine-lispro (HUMALOG MIX  75/25) (75-25) 100 UNIT/ML SUSP injection    Sig: INJECT 17 UNITS SUBCUTANEOUSLY TWICE DAILY WITH A MEAL. MAX OF 50 UNITS DAILY.    Dispense:  30 mL    Refill:  3    90 day   clopidogrel (PLAVIX) 75 MG tablet    Sig: Take 1 tablet (75 mg total) by mouth daily.    Dispense:  90 tablet    Refill:  3    Keep refills on file if not ready yet   ezetimibe (ZETIA) 10 MG  tablet    Sig: Take 1 tablet (10 mg total) by mouth daily.    Dispense:  90 tablet    Refill:  3    Keep refills on file if not ready yet   hydrochlorothiazide (HYDRODIURIL) 25 MG tablet    Sig: Take 1 tablet (25 mg total) by mouth daily.    Dispense:  90 tablet    Refill:  3    Keep refills on file if not ready yet   isosorbide mononitrate (IMDUR) 30 MG 24 hr tablet    Sig: Take 1 tablet (30 mg total) by mouth daily.    Dispense:  90 tablet    Refill:  3    Keep refills on file if not ready yet   lisinopril (ZESTRIL) 10 MG tablet    Sig: Take 1 tablet (10 mg total) by mouth daily.    Dispense:  90 tablet    Refill:  3    Keep refills on file if not ready yet   metFORMIN (GLUCOPHAGE) 1000 MG tablet    Sig: TAKE 1 TABLET BY MOUTH TWICE DAILY WITH A MEAL    Dispense:  180 tablet    Refill:  3    Keep refills on file if not ready yet   metoprolol succinate (TOPROL-XL) 50 MG 24 hr tablet    Sig: Take with or immediately following a meal.    Dispense:  90 tablet    Refill:  3    Keep refills on file if not ready yet      Follow up plan: Return in about 6 months (around 01/20/2022) for 6 month DM A1c, DM Foot, insulin adjust, HTN.  [name], [facility] 07/23/2021, 8:11 AM

## 2021-07-23 NOTE — Assessment & Plan Note (Signed)
Improved A1c to 6.6 previously due today for A1c Overall still well controlled nearly at goal No episodes of hypoglycemia. Occasional hyperglycemia. Complications - vascular disease including CAD s/p CABG, PAD, among other including hyperlipidemia, morbid obesity, and OSA - increases risk of future cardiovascular complications and poor glucose control OFF Trulicity (intolerance)  Plan:  1. Continue Humalog mix 75/25 vials 17 BID order 90 day, taper down if fasting CBG avg < 150, reduce by 1 unit BID, stop around 10 unit - Continue Metformin 1000mg  BID 2. Encourage improved lifestyle - low carb, low sugar diet, reduce portion size, continue improving regular exercise 3. Check CBG, bring log to next visit for review 4. Continue ASA, ACEi, Statin DM Eye 02/2021

## 2021-07-23 NOTE — Assessment & Plan Note (Signed)
Well controlled, chronic OSA on CPAP - Good adherence to CPAP nightly - Continue current CPAP therapy, patient seems to be benefiting from therapy  He may benefit from future repeat CPAP titration/sleep study as indicated by insurance for future medical supply orders. Last sleep study >10 year ago approx 

## 2021-07-23 NOTE — Assessment & Plan Note (Signed)
Controlled cholesterol on statin and zetia, with some limited lifestyle Last lipid panel 07/2020 due for repeat today Known CAD/PAD   Plan: 1. Continue current meds - Atorvastatin 80mg  daily, Zetia 10mg  daily 2. Continue ASA 81 and Plavix 75 DAPT for secondary ASCVD risk reduction 3. Encourage improved lifestyle - low carb/cholesterol, reduce portion size, continue improving regular exercise 4. Follow-up yearly lipids

## 2021-07-23 NOTE — Assessment & Plan Note (Signed)
Weight improved Encourage lifestyle diet exercise wt loss

## 2021-07-23 NOTE — Patient Instructions (Addendum)
Thank you for coming to the office today.  Flu Shot today  Refilled all medicine  Future age 64+ can do updated Pneumonia vaccine PCV20 and Shingles vaccine.  Try to gradually wean down on the insulin. If average sugar is < 150, can go ahead and reduce by 1 unit twice day, can do this every week if avg is still low.  Please schedule a Follow-up Appointment to: Return in about 6 months (around 01/20/2022) for 6 month DM A1c, DM Foot, insulin adjust, HTN.  If you have any other questions or concerns, please feel free to call the office or send a message through MyChart. You may also schedule an earlier appointment if necessary.  Additionally, you may be receiving a survey about your experience at our office within a few days to 1 week by e-mail or mail. We value your feedback.  Saralyn Pilar, DO [facility], [place]

## 2021-07-24 LAB — COMPLETE METABOLIC PANEL WITH GFR
AG Ratio: 1.5 (calc) (ref 1.0–2.5)
ALT: 25 U/L (ref 9–46)
AST: 26 U/L (ref 10–35)
Albumin: 4.6 g/dL (ref 3.6–5.1)
Alkaline phosphatase (APISO): 56 U/L (ref 35–144)
BUN: 23 mg/dL (ref 7–25)
CO2: 27 mmol/L (ref 20–32)
Calcium: 11.1 mg/dL — ABNORMAL HIGH (ref 8.6–10.3)
Chloride: 95 mmol/L — ABNORMAL LOW (ref 98–110)
Creat: 1.14 mg/dL (ref 0.70–1.35)
Globulin: 3 g/dL (calc) (ref 1.9–3.7)
Glucose, Bld: 102 mg/dL — ABNORMAL HIGH (ref 65–99)
Potassium: 4.2 mmol/L (ref 3.5–5.3)
Sodium: 131 mmol/L — ABNORMAL LOW (ref 135–146)
Total Bilirubin: 0.6 mg/dL (ref 0.2–1.2)
Total Protein: 7.6 g/dL (ref 6.1–8.1)
eGFR: 72 mL/min/{1.73_m2} (ref >=60)

## 2021-07-24 LAB — CBC WITH DIFFERENTIAL/PLATELET
Absolute Monocytes: 353 cells/uL (ref 200–950)
Basophils Absolute: 42 cells/uL (ref 0–200)
Basophils Relative: 1.1 %
Eosinophils Absolute: 61 cells/uL (ref 15–500)
Eosinophils Relative: 1.6 %
HCT: 42.7 % (ref 38.5–50.0)
Hemoglobin: 14.1 g/dL (ref 13.2–17.1)
Lymphs Abs: 1535 cells/uL (ref 850–3900)
MCH: 27.1 pg (ref 27.0–33.0)
MCHC: 33 g/dL (ref 32.0–36.0)
MCV: 82 fL (ref 80.0–100.0)
MPV: 10.7 fL (ref 7.5–12.5)
Monocytes Relative: 9.3 %
Neutro Abs: 1809 cells/uL (ref 1500–7800)
Neutrophils Relative %: 47.6 %
Platelets: 216 10*3/uL (ref 140–400)
RBC: 5.21 10*6/uL (ref 4.20–5.80)
RDW: 15.3 % — ABNORMAL HIGH (ref 11.0–15.0)
Total Lymphocyte: 40.4 %
WBC: 3.8 10*3/uL (ref 3.8–10.8)

## 2021-07-24 LAB — LIPID PANEL
Cholesterol: 84 mg/dL (ref <200)
HDL: 28 mg/dL — ABNORMAL LOW (ref >=40)
LDL Cholesterol (Calc): 39 mg/dL (calc)
Non-HDL Cholesterol (Calc): 56 mg/dL (calc) (ref <130)
Total CHOL/HDL Ratio: 3 (calc) (ref <5.0)
Triglycerides: 83 mg/dL (ref <150)

## 2021-07-24 LAB — HEMOGLOBIN A1C
Hgb A1c MFr Bld: 6.4 % of total Hgb — ABNORMAL HIGH (ref <5.7)
Mean Plasma Glucose: 137 mg/dL
eAG (mmol/L): 7.6 mmol/L

## 2021-07-24 LAB — PSA: PSA: 0.44 ng/mL (ref <=4.00)

## 2021-07-28 ENCOUNTER — Other Ambulatory Visit: Payer: Self-pay

## 2021-07-28 NOTE — Patient Instructions (Signed)
Visit Information  Craig Harvey was given information about Medicaid Managed Care team care coordination services as a part of their [place] Medicaid benefit. Craig Harvey verbally consented to engagement with the [facility] Managed Care team.   If you are experiencing a medical emergency, please call 911 or report to your local emergency department or urgent care.   If you have a non-emergency medical problem during routine business hours, please contact your provider's office and ask to speak with a nurse.   For questions related to your [facility], please call: [phone] or visit the homepage here: [email or web address]  If you would like to schedule transportation through your [facility], please call the following number at least 2 days in advance of your appointment: [phone].   Call the [place] at [phone], at any time, 24 hours a day, 7 days a week. If you are in danger or need immediate medical attention call 911.  If you would like help to quit smoking, call 1-800-QUIT-NOW 731-171-9580) OR Espaol: 1-855-Djelo-Ya (6-226-333-5456) o para ms informacin haga clic aqu or Text READY to 200-400 to register via text  Craig Harvey - following are the goals we discussed in your visit today Goals Addressed:  Please see Patient's Goals in the [place] of Care below.   Please see education materials related to today's visit provided as print materials.   The patient verbalized understanding of instructions provided today and agreed to receive a mailed copy of patient instruction and/or educational materials.  The Managed Medicaid care management team will reach out to the patient again over the next 30 days.  OB Visit appointment:   Craig Marvel RN, BSN Community Care Coordinator [place] [facility] Mobile: [phone]   Following is a copy of your plan of care:  Care Plan : [place] of Care  Updates made by Inge Rise, RN since 07/28/2021 12:00 AM     Problem: Chronic Disease Management and Care Coordination Needs for DM, CAD, HLD, HTN   Priority: High     Long-Range Goal: Development of Plan of Care for Chronic Disease Management and Care Coordination Needs (DM, CAD, HLD, HTN)   Start Date: 05/20/2021  Expected End Date: 09/17/2021  Priority: High  Note:   Current Barriers:  Knowledge Deficits related to plan of care for management of CAD, HTN, HLD, and DMII  Care Coordination needs related to Limited social support and Limited access to caregiver  Chronic Disease Management support and education needs related to CAD, HTN, HLD, and DMII Lacks caregiver support.        No Advanced Directives in place  RNCM Clinical Goal(s):  Patient will verbalize understanding of plan for management of CAD, HTN, HLD, and DMII as evidenced by good management and control all patient's chronic diseases verbalize basic understanding of CAD, HTN, HLD, and DMII disease process and self health management plan as evidenced by self monitoring activities of DM and HTN take all medications exactly as prescribed and will call provider for medication related questions as evidenced by patient's medication compliance    attend all scheduled medical appointments: 08/04/2021 [facility] Pharmacist; 08/31/2021 Cardiologist  as evidenced by no missed appointments        demonstrate ongoing adherence to prescribed treatment plan for CAD, HTN, HLD, and DMII as evidenced by good management & control of these chronic diseases demonstrate ongoing health management  independence as evidenced by good management and control of these chronic disease.        continue to work with Consulting civil engineer and/or Social Worker to address care management and care coordination needs related to CAD, HTN, HLD,  and DMII as evidenced by adherence to CM Team Scheduled appointments     work with pharmacist to address Limited social support and Limited access to caregiver related to CAD, HTN, HLD, and DMII as evidenced by review of EMR and patient or pharmacist report    through collaboration with Consulting civil engineer, provider, and care team.   Interventions: Inter-disciplinary care team collaboration (see longitudinal plan of care) Evaluation of current treatment plan related to  self management and patient's adherence to plan as established by provider   CAD  (Status: Goal on Track (progressing): YES.) Long Term Goal  Assessed understanding of CAD diagnosis Medications reviewed including medications utilized in CAD treatment plan Counseled on the importance of exercise goals with target of 150 minutes per week Reviewed Importance of taking all medications as prescribed Reviewed Importance of attending all scheduled provider appointments Advised to report any changes in symptoms or exercise tolerance Patient thinks he is losing weight because of decreased appetite and loss of taste temporary because of possible side effects caused by diabetic medication, Trulicity. Patient discontinued Trulicity in November 4967 and continued to have side effects into December 2022.  Sense of taste returning now. Discussed the possibility of a new sleep study for OSA in the future in order to obtain a new CPAP Machine.  Patient reports he does not use his CPAP machine every night due to difficulty sleeping with the CPAP mask.  Patient inquiring about the last CPAP machine - Inspire.  This [place] will research Inspire CPAP and will provide information to patient on our next visit.   Diabetes:  (Status: Goal on Track (progressing): YES.) Long Term Goal   Lab Results  Component Value Date   HGBA1C 6.4 (H) 07/23/2021     Assessed patient's understanding of A1c goal: <7% Reviewed medications with patient and discussed  importance of medication adherence;        Reviewed prescribed diet with patient low carbohydrates and low sugar; Discussed plans with patient for ongoing care management follow up and provided patient with direct contact information for care management team;      Reviewed scheduled/upcoming provider appointments including: 08/04/2021 [facility] Pharmacist; 08/31/2021 Cardilogist;         Advised patient, providing education and rationale, to check cbg Fasting blood sugar 2 times/week and PRN  and record        call provider for findings outside established parameters;       Referral made to pharmacy team for assistance with complex medication regimen - follow up telephone appointment scheduled for 08/04/2021;       Patient reports today's fasting blood sugar of  97.   Discussed PCP's recommendation to gradually reduce daily insulin by 1 unit BID.  Patient has not started the gradual insulin reduction but is considering to do it soon, either this week or next. I instructed the patient to create a blood sugar log and write down date of insulin dose reduction and record blood sugar results in this log.  Instructed him to monitor blood sugars, note if blood sugars are move towards greater than 150, note if he has any hyperglycemic episodes and/or how he is feeling overall after he makes the insulin dose change. We discussed  how he can make the insulin dose reduction and wait 1 or 2 weeks before making the next dose reduction.  Patient verbalized understanding of all above instructions.  Hyperlipidemia:  (Status: Goal on Track (progressing): YES.) Long Term Goal   Lab Results  Component Value Date   CHOL 84 07/23/2021   HDL 28 (L) 07/23/2021   LDLCALC 39 07/23/2021   TRIG 83 07/23/2021   CHOLHDL 3.0 07/23/2021     Medication review performed; .  Provider established cholesterol goals reviewed; Counseled on importance of regular laboratory monitoring as prescribed; Reviewed importance of limiting foods high  in cholesterol; Reviewed exercise goals and target of 150 minutes per week; Patient reports he has not been riding his stationary bike lately but will start back at it.  This RN Care Manager provided education on how to increase HDL level by increasing daily exercise.  Hypertension: (Status: Goal on Track (progressing): YES.) Last practice recorded BP readings:   BP Readings from Last 3 Encounters:  07/23/21 124/78  01/19/21 122/70  08/25/20 128/86     Most recent eGFR/CrCl: No results found for: EGFR  No components found for: CRCL  Evaluation of current treatment plan related to hypertension self management and patient's adherence to plan as established by provider;   Reviewed medications with patient and discussed importance of compliance;  Counseled on the importance of exercise goals with target of 150 minutes per week Discussed plans with patient for ongoing care management follow up and provided patient with direct contact information for care management team; Reviewed scheduled/upcoming provider appointments including: 08/04/2021 [facility] Pharmacist; 08/31/2021 Cardiologist  Patient Goals/Self-Care Activities: Patient will attend all scheduled provider appointments as evidenced by clinician review of documented attendance to scheduled appointments and patient/caregiver report Patient will call pharmacy for medication refills as evidenced by patient report and review of pharmacy fill history as appropriate Patient will continue to perform ADL's independently as evidenced by patient/caregiver report Patient will continue to perform IADL's independently as evidenced by patient/caregiver report Patient will call provider office for new concerns or questions as evidenced by review of documented incoming telephone call notes and patient report check blood sugar at prescribed times: 2 times per week before breakfast  take the blood sugar log to all doctor visits - check blood pressure weekly -  keep a blood pressure log - take blood pressure log to all doctor appointments - call doctor for signs and symptoms of high blood pressure - keep all doctor appointments - take medications for blood pressure exactly as prescribed - report new symptoms to your doctor - call for medicine refill 2 or 3 days before it runs out

## 2021-07-28 NOTE — Patient Outreach (Signed)
Medicaid Managed Care   Nurse Care Manager Note  07/28/2021 Name:  Craig Harvey MRN:  [id number] DOB:  1955-10-06  Craig Harvey is an 66 y.o. year old male who is a primary patient of [name], DO.  The [facility] Managed Care Coordination team was consulted for assistance with:    CAD HTN HLD DMII  Craig Harvey was given information about Medicaid Managed Care Coordination team services today. [name] Patient agreed to services and verbal consent obtained.  Engaged with patient by telephone for follow up visit in response to provider referral for case management and/or care coordination services.   Assessments/Interventions:  Review of past medical history, allergies, medications, health status, including review of consultants reports, laboratory and other test data, was performed as part of comprehensive evaluation and provision of chronic care management services.  SDOH (Social Determinants of Health) assessments and interventions performed:   Care Plan  No Known Allergies  Medications Reviewed Today     Reviewed by Inge Rise, RN (Case Manager) on 07/28/21 at [place] List Status: <None>   Medication Order Taking? Sig Documenting Provider Last Dose Status Informant  aspirin EC 81 MG tablet [id number] No Take 1 tablet (81 mg total) by mouth daily. Minna Merritts, MD Taking Active   atorvastatin (LIPITOR) 80 MG tablet [id number] No Take 1 tablet (80 mg total) by mouth daily. [name], DO Taking Active   baclofen (LIORESAL) 10 MG tablet [id number] No Take 0.5-1 tablets (5-10 mg total) by mouth 2 (two) times daily as needed for muscle spasms (leg pain). [name], DO Taking Active   BD VEO INSULIN SYRINGE U/F 31G X 15/64" 0.3 ML MISC [id number] No USE AS DIRECTED TWICE DAILY [place] Devonne Doughty, DO Taking Active   clopidogrel (PLAVIX) 75 MG tablet [id number]  Take 1 tablet (75 mg total) by mouth daily.  [name], DO  Active   ezetimibe (ZETIA) 10 MG tablet [id number]  Take 1 tablet (10 mg total) by mouth daily. [name], DO  Active   glucose blood (ACCU-CHEK AVIVA PLUS) test strip [id number] No Use to check blood sugar up to 3 x daily [name], DO Taking Active   hydrochlorothiazide (HYDRODIURIL) 25 MG tablet [id number]  Take 1 tablet (25 mg total) by mouth daily. Karamalegos, Devonne Doughty, DO  Active   insulin lispro protamine-lispro (HUMALOG MIX 75/25) (75-25) 100 UNIT/ML SUSP injection [id number]  INJECT 17 UNITS SUBCUTANEOUSLY TWICE DAILY WITH A MEAL. MAX OF 50 UNITS DAILY. Karamalegos, Devonne Doughty, DO  Active   isosorbide mononitrate (IMDUR) 30 MG 24 hr tablet [id number]  Take 1 tablet (30 mg total) by mouth daily. Karamalegos, Devonne Doughty, DO  Active   lisinopril (ZESTRIL) 10 MG tablet [id number]  Take 1 tablet (10 mg total) by mouth daily. [name], DO  Active   metFORMIN (GLUCOPHAGE) 1000 MG tablet [id number]  TAKE 1 TABLET BY MOUTH TWICE DAILY WITH A MEAL Karamalegos, Devonne Doughty, DO  Active   metoprolol succinate (TOPROL-XL) 50 MG 24 hr tablet [id number]  Take with or immediately following a meal. [name], DO  Active   Syringe, Disposable, 3 ML MISC [id number] No Use to inject humalog mix 75/25 17u BID [name], DO Taking Active             Patient Active Problem List   Diagnosis Date Noted   Leg cramping 01/09/2020   CAD (coronary artery disease), native  coronary artery 01/22/2018   Spondylosis of lumbar region without myelopathy or radiculopathy 10/26/2017   Chronic pain of left lower extremity 10/26/2017   Hypertension    Hyperlipidemia associated with type 2 diabetes mellitus ([place])    S/P CABG x 3 01/14/2015   Atypical chest pain 01/09/2015   DM (diabetes mellitus), type 2 with peripheral vascular complications (HCC)    OSA on CPAP    Morbid obesity with BMI of 45.0-49.9, adult ([place])      Conditions to be addressed/monitored per PCP order:  CAD, HTN, HLD, and DMII  Care Plan : RN Care Manager Plan of Care  Updates made by Inge Rise, RN since 07/28/2021 12:00 AM     Problem: Chronic Disease Management and Care Coordination Needs for DM, CAD, HLD, HTN   Priority: High     Long-Range Goal: Development of Plan of Care for Chronic Disease Management and Care Coordination Needs (DM, CAD, HLD, HTN)   Start Date: 05/20/2021  Expected End Date: 09/17/2021  Priority: High  Note:   Current Barriers:  Knowledge Deficits related to plan of care for management of CAD, HTN, HLD, and DMII  Care Coordination needs related to Limited social support and Limited access to caregiver  Chronic Disease Management support and education needs related to CAD, HTN, HLD, and DMII Lacks caregiver support.        No Advanced Directives in place  RNCM Clinical Goal(s):  Patient will verbalize understanding of plan for management of CAD, HTN, HLD, and DMII as evidenced by good management and control all patient's chronic diseases verbalize basic understanding of CAD, HTN, HLD, and DMII disease process and self health management plan as evidenced by self monitoring activities of DM and HTN take all medications exactly as prescribed and will call provider for medication related questions as evidenced by patient's medication compliance    attend all scheduled medical appointments: 08/04/2021 [facility] Pharmacist; 08/31/2021 Cardiologist  as evidenced by no missed appointments        demonstrate ongoing adherence to prescribed treatment plan for CAD, HTN, HLD, and DMII as evidenced by good management & control of these chronic diseases demonstrate ongoing health management independence as evidenced by good management and control of these chronic disease.        continue to work with Consulting civil engineer and/or Social Worker to address care management and care coordination needs related to CAD, HTN, HLD,  and DMII as evidenced by adherence to CM Team Scheduled appointments     work with pharmacist to address Limited social support and Limited access to caregiver related to CAD, HTN, HLD, and DMII as evidenced by review of EMR and patient or pharmacist report    through collaboration with Consulting civil engineer, provider, and care team.   Interventions: Inter-disciplinary care team collaboration (see longitudinal plan of care) Evaluation of current treatment plan related to  self management and patient's adherence to plan as established by provider   CAD  (Status: Goal on Track (progressing): YES.) Long Term Goal  Assessed understanding of CAD diagnosis Medications reviewed including medications utilized in CAD treatment plan Counseled on the importance of exercise goals with target of 150 minutes per week Reviewed Importance of taking all medications as prescribed Reviewed Importance of attending all scheduled provider appointments Advised to report any changes in symptoms or exercise tolerance Patient thinks he is losing weight because of decreased appetite and loss of taste temporary because of possible side effects caused by diabetic medication, Trulicity.  Patient discontinued Trulicity in November 6387 and continued to have side effects into December 2022.  Sense of taste returning now. Discussed the possibility of a new sleep study for OSA in the future in order to obtain a new CPAP Machine.  Patient reports he does not use his CPAP machine every night due to difficulty sleeping with the CPAP mask.  Patient inquiring about the last CPAP machine - Inspire.  This [place] will research Inspire CPAP and will provide information to patient on our next visit.   Diabetes:  (Status: Goal on Track (progressing): YES.) Long Term Goal   Lab Results  Component Value Date   HGBA1C 6.4 (H) 07/23/2021     Assessed patient's understanding of A1c goal: <7% Reviewed medications with patient and discussed  importance of medication adherence;        Reviewed prescribed diet with patient low carbohydrates and low sugar; Discussed plans with patient for ongoing care management follow up and provided patient with direct contact information for care management team;      Reviewed scheduled/upcoming provider appointments including: 08/04/2021 [facility] Pharmacist; 08/31/2021 Cardilogist;         Advised patient, providing education and rationale, to check cbg Fasting blood sugar 2 times/week and PRN  and record        call provider for findings outside established parameters;       Referral made to pharmacy team for assistance with complex medication regimen - follow up telephone appointment scheduled for 08/04/2021;       Patient reports today's fasting blood sugar of  97.   Discussed PCP's recommendation to gradually reduce daily insulin by 1 unit BID.  Patient has not started the gradual insulin reduction but is considering to do it soon, either this week or next. I instructed the patient to create a blood sugar log and write down date of insulin dose reduction and record blood sugar results in this log.  Instructed him to monitor blood sugars, note if blood sugars are move towards greater than 150, note if he has any hyperglycemic episodes and/or how he is feeling overall after he makes the insulin dose change. We discussed how he can make the insulin dose reduction and wait 1 or 2 weeks before making the next dose reduction.  Patient verbalized understanding of all above instructions.  Hyperlipidemia:  (Status: Goal on Track (progressing): YES.) Long Term Goal   Lab Results  Component Value Date   CHOL 84 07/23/2021   HDL 28 (L) 07/23/2021   LDLCALC 39 07/23/2021   TRIG 83 07/23/2021   CHOLHDL 3.0 07/23/2021     Medication review performed; .  Provider established cholesterol goals reviewed; Counseled on importance of regular laboratory monitoring as prescribed; Reviewed importance of limiting foods high  in cholesterol; Reviewed exercise goals and target of 150 minutes per week; Patient reports he has not been riding his stationary bike lately but will start back at it.  This RN Care Manager provided education on how to increase HDL level by increasing daily exercise.  Hypertension: (Status: Goal on Track (progressing): YES.) Last practice recorded BP readings:   BP Readings from Last 3 Encounters:  07/23/21 124/78  01/19/21 122/70  08/25/20 128/86     Most recent eGFR/CrCl: No results found for: EGFR  No components found for: CRCL  Evaluation of current treatment plan related to hypertension self management and patient's adherence to plan as established by provider;   Reviewed medications with patient and discussed  importance of compliance;  Counseled on the importance of exercise goals with target of 150 minutes per week Discussed plans with patient for ongoing care management follow up and provided patient with direct contact information for care management team; Reviewed scheduled/upcoming provider appointments including: 08/04/2021 [facility] Pharmacist; 08/31/2021 Cardiologist  Patient Goals/Self-Care Activities: Patient will attend all scheduled provider appointments as evidenced by clinician review of documented attendance to scheduled appointments and patient/caregiver report Patient will call pharmacy for medication refills as evidenced by patient report and review of pharmacy fill history as appropriate Patient will continue to perform ADL's independently as evidenced by patient/caregiver report Patient will continue to perform IADL's independently as evidenced by patient/caregiver report Patient will call provider office for new concerns or questions as evidenced by review of documented incoming telephone call notes and patient report check blood sugar at prescribed times: 2 times per week before breakfast  take the blood sugar log to all doctor visits - check blood pressure weekly -  keep a blood pressure log - take blood pressure log to all doctor appointments - call doctor for signs and symptoms of high blood pressure - keep all doctor appointments - take medications for blood pressure exactly as prescribed - report new symptoms to your doctor - call for medicine refill 2 or 3 days before it runs out       Follow Up:  Patient agrees to Care Plan and Follow-up.  Plan: The Managed Medicaid care management team will reach out to the patient again over the next 30 days.  Date/time of next scheduled RN care management/care coordination outreach:  08/26/2021 at 3:00 pm  Salvatore Marvel RN, [place] [facility] Mobile: [phone]

## 2021-08-04 ENCOUNTER — Other Ambulatory Visit: Payer: Self-pay

## 2021-08-17 NOTE — Patient Instructions (Addendum)
Visit Information  Craig Harvey was given information about Medicaid Managed Care team care coordination services as a part of their [place] Medicaid benefit. Craig Harvey verbally consented to engagement with the [facility] Managed Care team.   If you are experiencing a medical emergency, please call 911 or report to your local emergency department or urgent care.   If you have a non-emergency medical problem during routine business hours, please contact your provider's office and ask to speak with a nurse.   For questions related to your [facility], please call: [phone] or visit the homepage here: [email or web address]  If you would like to schedule transportation through your [facility], please call the following number at least 2 days in advance of your appointment: [phone].   Call the [place] at [phone], at any time, 24 hours a day, 7 days a week. If you are in danger or need immediate medical attention call 911.  If you would like help to quit smoking, call 1-800-QUIT-NOW 321-186-9027) OR Espaol: 1-855-Djelo-Ya HD:1601594) o para ms informacin haga clic aqu or Text READY to 200-400 to register via text  Craig Harvey - following are the goals we discussed in your visit today:   Goals Addressed   None     The patient verbalized understanding of instructions, educational materials, and care plan provided today and declined offer to receive copy of patient instructions, educational materials, and care plan.    RN Care Manager will call on 08/26/21  Hughes Better PharmD, CPP High Risk Managed Medicaid [place] [phone]   Following is a copy of your plan of care:  Care Plan : General Pharmacy (Adult)  Updates made by Hughes Better, RPH-CPP since 08/17/2021 12:00 AM     Problem: Chronic Disease  Management   Priority: High     Long-Range Goal: Patient Stated   Start Date: 06/30/2021  Expected End Date: 09/28/2021  This Visit's Progress: On track  Priority: High  Note:   Current Barriers:  No current medication barriers  Pharmacist Clinical Goal(s):  Patient will continue to take medications  Interventions: Inter-disciplinary care team collaboration (see longitudinal plan of care) Comprehensive medication review performed; medication list updated in electronic medical record  Diabetes:  Controlled; current treatment: Humalog 75/25 17 units twice daily, metformin 1000mg  twice daily; patient previously on Trulicity but was discontinued due to headaches which patient reports is still ongoing 6 months later  Current glucose readings: fasting glucose: 112 (now checking once daily)  Denies hypoglycemic/hyperglycemic symptoms  Current meal patterns: 3 meals + snack Breakfast: eggs, sausage or bacon; cereal Lunch: chicken sandwich Dinner: protein, vegetables, starch Snack: cake, candy, or sugar free ice cream Drinks: water, diet tea, diet sprite, coffee in AM  Educated on importance of avoiding sugary snacks Counseled on likelihood of headaches being caused by something other than Trulicity as medication would no longer be present in body but headaches are still ongoing Recommended patient check blood glucose 1-2xday Collaborated with PCP to recommend patient re-trial GLP (can try Ozempic instead)   Hypertension:  Controlled; current treatment: HCTZ 25mg , lisinopril 10mg , Isorbide mononitrate 30mg , metoprolol succinate 50mg ;   Current home readings: 112/70   Denies hypotensive/hypertensive symptoms  Recommended patient continue to check blood pressures 2-3x/week   Patient Goals/Self-Care Activities patient will:  - check glucose 1-2x/day, document, and provide at future appointments check blood pressure 2-3x/week, document, and provide at future appointments  Follow Up  Plan: Telephone follow up appointment with care management team member scheduled for: 08/26/21. No additional medication barriers identified at this time.

## 2021-08-17 NOTE — Patient Instructions (Signed)
Visit Information  Craig Harvey was given information about Medicaid Managed Care team care coordination services as a part of their [facility] Medicaid benefit. Craig Harvey verbally consented to engagement with the [facility] Managed Care team.   If you are experiencing a medical emergency, please call 911 or report to your local emergency department or urgent care.   If you have a non-emergency medical problem during routine business hours, please contact your provider's office and ask to speak with a nurse.   For questions related to your [facility], please call: [phone] or visit the homepage here: [email or web address]  If you would like to schedule transportation through your [facility], please call the following number at least 2 days in advance of your appointment: [phone].   Call the Behavioral Health Crisis Line at [phone], at any time, 24 hours a day, 7 days a week. If you are in danger or need immediate medical attention call 911.  If you would like help to quit smoking, call 1-800-QUIT-NOW (503-160-2124) OR Espaol: 1-855-Djelo-Ya (8-546-270-3500) o para ms informacin haga clic aqu or Text READY to 938-182 to register via text  Craig Harvey - following are the goals we discussed in your visit today:   Goals Addressed   None     The patient verbalized understanding of instructions, educational materials, and care plan provided today and declined offer to receive copy of patient instructions, educational materials, and care plan.   RN Care Manager will call on 07/28/21 Pharmacist will call on 08/04/21  Cheral Almas PharmD, CPP High Risk Managed Medicaid [place] [phone]   Following is a copy of your plan of care:  Care Plan : General Pharmacy (Adult)  Updates made by Cheral Almas, RPH-CPP since 08/17/2021 12:00 AM      Problem: Chronic Disease Management   Priority: High     Long-Range Goal: Patient Stated   Start Date: 06/30/2021  Expected End Date: 09/28/2021  This Visit's Progress: On track  Priority: High  Note:   Current Barriers:  Suboptimal therapeutic regimen for T2DM   Pharmacist Clinical Goal(s):  patient will adhere to plan to optimize therapeutic regimen for T2DM as evidenced by report of adherence to recommended medication management changes through collaboration with PharmD and provider.    Interventions: Inter-disciplinary care team collaboration (see longitudinal plan of care) Comprehensive medication review performed; medication list updated in electronic medical record  Diabetes:  Controlled; current treatment: Humalog 75/25 17 units twice daily, metformin 1000mg  twice daily; patient previously on Trulicity but was discontinued due to headaches which patient reports is still ongoing 6 months later  Current glucose readings: fasting glucose: 116 (only checking 2x/week)  Denies hypoglycemic/hyperglycemic symptoms  Current meal patterns: 3 meals + snack Breakfast: eggs, sausage or bacon; cereal Lunch: chicken sandwich Dinner: protein, vegetables, starch Snack: cake, candy, or sugar free ice cream Drinks: water, diet tea, diet sprite, coffee in AM  Educated on importance of avoiding sugary snacks Counseled on likelihood of headaches being caused by something other than Trulicity as medication would no longer be present in body but headaches are still ongoing Recommended patient check blood glucose 1-2xday Collaborated with PCP to recommend patient re-trial GLP (can try Ozempic instead)   Hypertension:  Controlled; current treatment: HCTZ 25mg , lisinopril 10mg , Isorbide mononitrate 30mg , metoprolol succinate 50mg ;   Current home readings: 128/77  Denies hypotensive/hypertensive symptoms  Recommended patient continue to check blood pressures 2-3x/week   Patient  Goals/Self-Care Activities patient will:  - check glucose 1-2x/day, document, and provide at future appointments check blood pressure 2-3x/week, document, and provide at future appointments  Follow Up Plan: Telephone follow up appointment with care management team member scheduled for:07/28/21 with Care Manager; 08/04/21 Pharmacist

## 2021-08-17 NOTE — Patient Outreach (Signed)
Medicaid Managed Care Pharmacy Note  06/30/21. Name:  Craig Harvey MRN:  [id number] DOB:  01-14-1956  Craig Harvey is an 66 y.o. year old male who is a primary patient of Craig Cords, DO.  The [facility] Managed Care Coordination team was consulted for assistance with disease management and care coordination needs.    Engaged with patient by telephone for initial visit in response to referral for case management and/or care coordination services.  [name] was given information about Medicaid Managed Care Coordination team services today. [name] Patient agreed to services and verbal consent obtained.  Objective:  Lab Results  Component Value Date   CREATININE 1.14 07/23/2021   CREATININE 0.85 07/15/2020   CREATININE 0.85 02/20/2020    Lab Results  Component Value Date   HGBA1C 6.4 (H) 07/23/2021       Component Value Date/Time   CHOL 84 07/23/2021 0845   CHOL 158 06/09/2016 1202   TRIG 83 07/23/2021 0845   HDL 28 (L) 07/23/2021 0845   HDL 46 06/09/2016 1202   CHOLHDL 3.0 07/23/2021 0845   VLDL 30 01/09/2015 0425   LDLCALC 39 07/23/2021 0845    BP Readings from Last 3 Encounters:  07/23/21 124/78  01/19/21 122/70  08/25/20 128/86    Assessment/Interventions: Review of patient past medical history, allergies, medications, health status, including review of consultants reports, laboratory and other test data, was performed as part of comprehensive evaluation and provision of chronic care management services.   SDOH:  (Social Determinants of Health) assessments and interventions performed:    Care Plan  No Known Allergies  Medications Reviewed Today     Reviewed by Leane Call, RN (Case Manager) on 07/28/21 at 1549  Med List Status: <None>   Medication Order Taking? Sig Documenting Provider Last Dose Status Informant  aspirin EC 81 MG tablet [id number] No Take 1 tablet (81 mg total) by mouth daily. Craig Iba, MD  Taking Active   atorvastatin (LIPITOR) 80 MG tablet [id number] No Take 1 tablet (80 mg total) by mouth daily. Craig Cords, DO Taking Active   baclofen (LIORESAL) 10 MG tablet [id number] No Take 0.5-1 tablets (5-10 mg total) by mouth 2 (two) times daily as needed for muscle spasms (leg pain). Craig Cords, DO Taking Active   BD VEO INSULIN SYRINGE U/F 31G X 15/64" 0.3 ML MISC [id number] No USE AS DIRECTED TWICE DAILY Althea Charon Craig Neat, DO Taking Active   clopidogrel (PLAVIX) 75 MG tablet [id number]  Take 1 tablet (75 mg total) by mouth daily. Craig Cords, DO  Active   ezetimibe (ZETIA) 10 MG tablet [id number]  Take 1 tablet (10 mg total) by mouth daily. Craig Cords, DO  Active   glucose blood (ACCU-CHEK AVIVA PLUS) test strip [id number] No Use to check blood sugar up to 3 x daily Craig Cords, DO Taking Active   hydrochlorothiazide (HYDRODIURIL) 25 MG tablet [id number]  Take 1 tablet (25 mg total) by mouth daily. Karamalegos, Craig Neat, DO  Active   insulin lispro protamine-lispro (HUMALOG MIX 75/25) (75-25) 100 UNIT/ML SUSP injection [id number]  INJECT 17 UNITS SUBCUTANEOUSLY TWICE DAILY WITH A MEAL. MAX OF 50 UNITS DAILY. Karamalegos, Craig Neat, DO  Active   isosorbide mononitrate (IMDUR) 30 MG 24 hr tablet [id number]  Take 1 tablet (30 mg total) by mouth daily. Craig Cords, DO  Active   lisinopril (ZESTRIL) 10 MG tablet [id number]  Take 1 tablet (10 mg total) by  mouth daily. Craig Cords, DO  Active   metFORMIN (GLUCOPHAGE) 1000 MG tablet [id number]  TAKE 1 TABLET BY MOUTH TWICE DAILY WITH A MEAL Karamalegos, Craig Shela Commons, DO  Active   metoprolol succinate (TOPROL-XL) 50 MG 24 hr tablet [id number]  Take with or immediately following a meal. Craig Cords, DO  Active   Syringe, Disposable, 3 ML MISC [id number] No Use to inject humalog mix 75/25 17u BID Craig Cords, DO Taking Active              Patient Active Problem List   Diagnosis Date Noted   Leg cramping 01/09/2020   CAD (coronary artery disease), native coronary artery 01/22/2018   Spondylosis of lumbar region without myelopathy or radiculopathy 10/26/2017   Chronic pain of left lower extremity 10/26/2017   Hypertension    Hyperlipidemia associated with type 2 diabetes mellitus (HCC)    S/P CABG x 3 01/14/2015   Atypical chest pain 01/09/2015   DM (diabetes mellitus), type 2 with peripheral vascular complications (HCC)    OSA on CPAP    Morbid obesity with BMI of 45.0-49.9, adult (HCC)     Conditions to be addressed/monitored per PCP order:  HTN and DMII  Care Plan : General Pharmacy (Adult)  Updates made by Cheral Almas, RPH-CPP since 08/17/2021 12:00 AM     Problem: Chronic Disease Management   Priority: High     Long-Range Goal: Patient Stated   Start Date: 06/30/2021  Expected End Date: 09/28/2021  This Visit's Progress: On track  Priority: High  Note:   Current Barriers:  Suboptimal therapeutic regimen for T2DM   Pharmacist Clinical Goal(s):  patient will adhere to plan to optimize therapeutic regimen for T2DM as evidenced by report of adherence to recommended medication management changes through collaboration with PharmD and provider.    Interventions: Inter-disciplinary care team collaboration (see longitudinal plan of care) Comprehensive medication review performed; medication list updated in electronic medical record  Diabetes:  Controlled; current treatment: Humalog 75/25 17 units twice daily, metformin 1000mg  twice daily; patient previously on Trulicity but was discontinued due to headaches which patient reports is still ongoing 6 months later  Current glucose readings: fasting glucose: 116 (only checking 2x/week)  Denies hypoglycemic/hyperglycemic symptoms  Current meal patterns: 3 meals + snack Breakfast: eggs, sausage or bacon; cereal Lunch: chicken sandwich Dinner: protein,  vegetables, starch Snack: cake, candy, or sugar free ice cream Drinks: water, diet tea, diet sprite, coffee in AM  Educated on importance of avoiding sugary snacks Counseled on likelihood of headaches being caused by something other than Trulicity as medication would no longer be present in body but headaches are still ongoing Recommended patient check blood glucose 1-2xday Collaborated with PCP to recommend patient re-trial GLP (can try Ozempic instead)   Hypertension:  Controlled; current treatment: HCTZ 25mg , lisinopril 10mg , Isorbide mononitrate 30mg , metoprolol succinate 50mg ;   Current home readings: 128/77  Denies hypotensive/hypertensive symptoms  Recommended patient continue to check blood pressures 2-3x/week   Patient Goals/Self-Care Activities patient will:  - check glucose 1-2x/day, document, and provide at future appointments check blood pressure 2-3x/week, document, and provide at future appointments  Follow Up Plan: Telephone follow up appointment with care management team member scheduled for:07/28/21 with Care Manager; 08/04/21 Pharmacist     PharmD, CPP High Risk Managed [facility] [phone]

## 2021-08-17 NOTE — Patient Outreach (Signed)
Medicaid Managed Care Pharmacy Note 08/04/21 Name:  Craig Harvey MRN:  [id number] DOB:  [birth year]  Craig Harvey is an 66 y.o. year old male who is a primary patient of Smitty Cords, DO.  The [facility] Managed Care Coordination team was consulted for assistance with disease management and care coordination needs.    Engaged with patient by telephone for follow up visit in response to referral for case management and/or care coordination services.  Craig Harvey was given information about Medicaid Managed Care Coordination team services today. Craig Harvey Patient agreed to services and verbal consent obtained.  Objective:  Lab Results  Component Value Date   CREATININE 1.14 07/23/2021   CREATININE 0.85 07/15/2020   CREATININE 0.85 02/20/2020    Lab Results  Component Value Date   HGBA1C 6.4 (H) 07/23/2021       Component Value Date/Time   CHOL 84 07/23/2021 0845   CHOL 158 06/09/2016 1202   TRIG 83 07/23/2021 0845   HDL 28 (L) 07/23/2021 0845   HDL 46 06/09/2016 1202   CHOLHDL 3.0 07/23/2021 0845   VLDL 30 01/09/2015 0425   LDLCALC 39 07/23/2021 0845     BP Readings from Last 3 Encounters:  07/23/21 124/78  01/19/21 122/70  08/25/20 128/86    Assessment/Interventions: Review of patient past medical history, allergies, medications, health status, including review of consultants reports, laboratory and other test data, was performed as part of comprehensive evaluation and provision of chronic care management services.   SDOH:  (Social Determinants of Health) assessments and interventions performed:    Care Plan  No Known Allergies  Medications Reviewed Today     Reviewed by Leane Call, RN (Case Manager) on 07/28/21 at 1549  Med List Status: <None>   Medication Order Taking? Sig Documenting Provider Last Dose Status Informant  aspirin EC 81 MG tablet [id number] No Take 1 tablet (81 mg total) by mouth daily. Antonieta Iba, MD  Taking Active   atorvastatin (LIPITOR) 80 MG tablet [id number] No Take 1 tablet (80 mg total) by mouth daily. Smitty Cords, DO Taking Active   baclofen (LIORESAL) 10 MG tablet [id number] No Take 0.5-1 tablets (5-10 mg total) by mouth 2 (two) times daily as needed for muscle spasms (leg pain). Smitty Cords, DO Taking Active   BD VEO INSULIN SYRINGE U/F 31G X 15/64" 0.3 ML MISC [id number] No USE AS DIRECTED TWICE DAILY Althea Charon Netta Neat, DO Taking Active   clopidogrel (PLAVIX) 75 MG tablet [id number]  Take 1 tablet (75 mg total) by mouth daily. Smitty Cords, DO  Active   ezetimibe (ZETIA) 10 MG tablet [id number]  Take 1 tablet (10 mg total) by mouth daily. Smitty Cords, DO  Active   glucose blood (ACCU-CHEK AVIVA PLUS) test strip [id number] No Use to check blood sugar up to 3 x daily Smitty Cords, DO Taking Active   hydrochlorothiazide (HYDRODIURIL) 25 MG tablet [id number]  Take 1 tablet (25 mg total) by mouth daily. Karamalegos, Netta Neat, DO  Active   insulin lispro protamine-lispro (HUMALOG MIX 75/25) (75-25) 100 UNIT/ML SUSP injection [id number]  INJECT 17 UNITS SUBCUTANEOUSLY TWICE DAILY WITH A MEAL. MAX OF 50 UNITS DAILY. Karamalegos, Netta Neat, DO  Active   isosorbide mononitrate (IMDUR) 30 MG 24 hr tablet [id number]  Take 1 tablet (30 mg total) by mouth daily. Smitty Cords, DO  Active   lisinopril (ZESTRIL) 10 MG tablet [id number]  Take 1 tablet (10 mg total)  by mouth daily. Smitty Cords, DO  Active   metFORMIN (GLUCOPHAGE) 1000 MG tablet [id number]  TAKE 1 TABLET BY MOUTH TWICE DAILY WITH A MEAL Karamalegos, Alexander Shela Commons, DO  Active   metoprolol succinate (TOPROL-XL) 50 MG 24 hr tablet [id number]  Take with or immediately following a meal. Smitty Cords, DO  Active   Syringe, Disposable, 3 ML MISC [id number] No Use to inject humalog mix 75/25 17u BID Smitty Cords, DO Taking Active              Patient Active Problem List   Diagnosis Date Noted   Leg cramping 01/09/2020   CAD (coronary artery disease), native coronary artery 01/22/2018   Spondylosis of lumbar region without myelopathy or radiculopathy 10/26/2017   Chronic pain of left lower extremity 10/26/2017   Hypertension    Hyperlipidemia associated with type 2 diabetes mellitus (HCC)    S/P CABG x 3 01/14/2015   Atypical chest pain 01/09/2015   DM (diabetes mellitus), type 2 with peripheral vascular complications (HCC)    OSA on CPAP    Morbid obesity with BMI of 45.0-49.9, adult (HCC)     Conditions to be addressed/monitored per PCP order:  HTN and DMII  Care Plan : General Pharmacy (Adult)  Updates made by Cheral Almas, RPH-CPP since 08/17/2021 12:00 AM     Problem: Chronic Disease Management   Priority: High     Long-Range Goal: Patient Stated   Start Date: 06/30/2021  Expected End Date: 09/28/2021  This Visit's Progress: On track  Priority: High  Note:   Current Barriers:  No current medication barriers  Pharmacist Clinical Goal(s):  Patient will continue to take medications  Interventions: Inter-disciplinary care team collaboration (see longitudinal plan of care) Comprehensive medication review performed; medication list updated in electronic medical record  Diabetes:  Controlled; current treatment: Humalog 75/25 17 units twice daily, metformin 1000mg  twice daily; patient previously on Trulicity but was discontinued due to headaches which patient reports is still ongoing 6 months later  Current glucose readings: fasting glucose: 112 (now checking once daily)  Denies hypoglycemic/hyperglycemic symptoms  Current meal patterns: 3 meals + snack Breakfast: eggs, sausage or bacon; cereal Lunch: chicken sandwich Dinner: protein, vegetables, starch Snack: cake, candy, or sugar free ice cream Drinks: water, diet tea, diet sprite, coffee in AM  Educated on importance of avoiding sugary  snacks Counseled on likelihood of headaches being caused by something other than Trulicity as medication would no longer be present in body but headaches are still ongoing Recommended patient check blood glucose 1-2xday Collaborated with PCP to recommend patient re-trial GLP (can try Ozempic instead)   Hypertension:  Controlled; current treatment: HCTZ 25mg , lisinopril 10mg , Isorbide mononitrate 30mg , metoprolol succinate 50mg ;   Current home readings: 112/70   Denies hypotensive/hypertensive symptoms  Recommended patient continue to check blood pressures 2-3x/week   Patient Goals/Self-Care Activities patient will:  - check glucose 1-2x/day, document, and provide at future appointments check blood pressure 2-3x/week, document, and provide at future appointments  Follow Up Plan: Telephone follow up appointment with care management team member scheduled for: 08/26/21. No additional medication barriers identified at this time.        PharmD, CPP High Risk Managed Medicaid [place] [phone]

## 2021-08-26 ENCOUNTER — Other Ambulatory Visit: Payer: Self-pay

## 2021-08-26 NOTE — Patient Instructions (Signed)
[name] ,   The [facility] Managed Care Team is available to provide assistance to you with your healthcare needs at no cost and as a benefit of your [facility] plan.   I'm sorry I was unable to reach you today for our scheduled appointment. Our care guide will call you to reschedule our telephone appointment. Please call me at the number below. I am available to be of assistance to you regarding your healthcare needs. .   Thank you,   Virgina Norfolk RN, BSN [facility] Coordinator [facility]   [facility] Mobile: [phone]

## 2021-08-26 NOTE — Patient Outreach (Signed)
Care Coordination  08/26/2021  KADRI MEHL Jan 10, 1956 [id number]  08/26/2021 Name: SHANARD DUTTA MRN: [id number] DOB: 09-17-1955  Referred by: [name], DO Reason for referral : High Risk Managed Medicaid (Unsuccessful Telephone Outreach)   An unsuccessful telephone outreach was attempted today. The patient was referred to the case management team for assistance with care management and care coordination.    Follow Up Plan: A HIPAA compliant phone message was left for the patient providing contact information and requesting a return call.  The Managed Medicaid care management team will reach out to the patient again over the next 7 - 14 days.   Salvatore Marvel RN, BSN Community Care Coordinator [facility] Mobile: [phone]

## 2021-08-30 NOTE — Progress Notes (Signed)
Cardiology Office Note  Date:  08/31/2021   ID:  Craig, Harvey 1956/04/22, MRN [id number]  PCP:  Craig Hauser, DO   Chief Complaint  Patient presents with   12 month follow up     "Doing well." Medications reviewed by the patient verbally.     HPI:  66 year old male with known history of  HTN,  Morbid obesity OSA not on CPAP,  CAD s/p 3 vessel CABG on 01/14/2015,  DM,  HLD  who presents for routine follow-up of his coronary artery disease, diabetes, hyperlipidemia  Last seen in clinic by myself  2/22 In follow-up today reports doing relatively well Denies any anginal symptoms  Side effects on trulicity [id number] 6.4 , could not eat, stomach pain Total chol 84  Works at home, fixing house Has a stationary bike No significant lower extremity edema  Chronic back pain worse, nerve pain left leg better  Previously reported having some erectile dysfunction issues,  low testosterone (results not seen in the computer)  EKG personally reviewed by mysel on todays visit Shows normal sinus rhythm rate 81 bpm  Unable to exclude old inferior MI  Other past medical history presented to [facility] on [facility]/06/2015 with complaints of nausea, feeling hot, and chest pain with radiation into his left shoulder.  EKG was unremarkable in ED and Cardiac enzymes were negative.   Echo on 7/4 showed EF 60-65%, no regional wall motion abnormalities, no valvular abnormalities, PASP normal, normal study.  He underwent nuclear stress test that showed large defect of moderate severity in the mid anterior, mid anteroseptal, mid anterolateral, apical anterior, apical lateral and apex locations. F    underwent cardiac catheterization which showed severe 2 vessel CAD (ost LAD 70%, mid LAD 99%, ost LCx to prox LCx 95% after the take off of OM1, mid LCx 80% prior to takeoff to OM2, normal LV function). It was felt CABG would be indicated and the patient was transferred to [facility] for further care.        He underwent successful 3 vessel CABG (LIMA-LAD, SVG-OM, SVG-Ramus) on 01/14/2015. Post-op he developed Afib and converted on IV amiodarone. His A1C was poorly controlled coming in at 10.0%.     PMH:   has a past medical history of CAD (coronary artery disease), History of echocardiogram, Hyperlipidemia LDL goal <70, Hypertension, Morbid obesity ([place]), OSA (obstructive sleep apnea), and Type II diabetes mellitus ([place]).  PSH:    Past Surgical History:  Procedure Laterality Date   CARDIAC CATHETERIZATION N/A 01/09/2015   Procedure: Left Heart Cath and Coronary Angiography;  Surgeon: Minna Merritts, MD;  Location: [place] [facility];  Service: Cardiovascular;  Laterality: N/A;   CORONARY ARTERY BYPASS GRAFT N/A 01/14/2015   Procedure: CORONARY ARTERY BYPASS GRAFTING (CABG), ON PUMP, TIMES THREE, USING LEFT INTERNAL MAMMARY ARTERY, RIGHT GREATER SAPHENOUS VEIN HARVESTED ENDOSCOPICALLY;  Surgeon: Ivin Poot, MD;  Location: [place];  Service: Open Heart Surgery;  Laterality: N/A;   HERNIA REPAIR     TEE WITHOUT CARDIOVERSION N/A 01/14/2015   Procedure: TRANSESOPHAGEAL ECHOCARDIOGRAM (TEE);  Surgeon: Ivin Poot, MD;  Location: [place];  Service: Open Heart Surgery;  Laterality: N/A;    Current Outpatient Medications  Medication Sig Dispense Refill   aspirin EC 81 MG tablet Take 1 tablet (81 mg total) by mouth daily. 90 tablet 3   baclofen (LIORESAL) 10 MG tablet Take 0.5-1 tablets (5-10 mg total) by mouth 2 (two) times daily as needed  for muscle spasms (leg pain). 180 each 1   BD VEO INSULIN SYRINGE U/F 31G X 15/64" 0.3 ML MISC USE AS DIRECTED TWICE DAILY 100 each 2   clopidogrel (PLAVIX) 75 MG tablet Take 1 tablet (75 mg total) by mouth daily. 90 tablet 3   ezetimibe (ZETIA) 10 MG tablet Take 1 tablet (10 mg total) by mouth daily. 90 tablet 3   glucose blood (ACCU-CHEK AVIVA PLUS) test strip Use to check blood sugar up to 3 x daily 100 each 12   hydrochlorothiazide (HYDRODIURIL) 25 MG  tablet Take 1 tablet (25 mg total) by mouth daily. 90 tablet 3   insulin lispro protamine-lispro (HUMALOG MIX 75/25) (75-25) 100 UNIT/ML SUSP injection INJECT 17 UNITS SUBCUTANEOUSLY TWICE DAILY WITH A MEAL. MAX OF 50 UNITS DAILY. 30 mL 3   isosorbide mononitrate (IMDUR) 30 MG 24 hr tablet Take 1 tablet (30 mg total) by mouth daily. 90 tablet 3   lisinopril (ZESTRIL) 10 MG tablet Take 1 tablet (10 mg total) by mouth daily. 90 tablet 3   metFORMIN (GLUCOPHAGE) 1000 MG tablet TAKE 1 TABLET BY MOUTH TWICE DAILY WITH A MEAL 180 tablet 3   metoprolol succinate (TOPROL-XL) 50 MG 24 hr tablet Take with or immediately following a meal. 90 tablet 3   Syringe, Disposable, 3 ML MISC Use to inject humalog mix 75/25 17u BID 200 each 5   atorvastatin (LIPITOR) 80 MG tablet Take 1 tablet (80 mg total) by mouth daily. 90 tablet 3   No current facility-administered medications for this visit.   Allergies:   Patient has no known allergies.   Social History:  The patient  reports that he quit smoking about 46 years ago. His smoking use included cigarettes. He has never used smokeless tobacco. He reports that he does not drink alcohol and does not use drugs.   Family History:   family history includes CAD in his brother and mother; Throat cancer in his father.    Review of Systems: Review of Systems  Constitutional: Negative.   Respiratory: Negative.    Cardiovascular: Negative.   Gastrointestinal: Negative.   Musculoskeletal:  Positive for back pain.       Left leg nerve pain  Neurological: Negative.   Psychiatric/Behavioral: Negative.    All other systems reviewed and are negative.  PHYSICAL EXAM: VS:  BP 120/70 (BP Location: Left Arm, Patient Position: Sitting, Cuff Size: Large)    Pulse 81    Ht 5\' 8"  (1.727 m)    Wt (!) 313 lb (142 kg)    SpO2 98%    BMI 47.59 kg/m  , BMI Body mass index is 47.59 kg/m. Constitutional:  oriented to person, place, and time. No distress.  Obese HENT:  Head: Grossly  normal Eyes:  no discharge. No scleral icterus.  Neck: No JVD, no carotid bruits  Cardiovascular: Regular rate and rhythm, no murmurs appreciated Pulmonary/Chest: Clear to auscultation bilaterally, no wheezes or rails Abdominal: Soft.  no distension.  no tenderness.  Musculoskeletal: Normal range of motion Neurological:  normal muscle tone. Coordination normal. No atrophy Skin: Skin warm and dry Psychiatric: normal affect, pleasant  Recent Labs: 07/23/2021: ALT 25; BUN 23; Creat 1.14; Hemoglobin 14.1; Platelets 216; Potassium 4.2; Sodium 131    Lipid Panel Lab Results  Component Value Date   CHOL 84 07/23/2021   HDL 28 (L) 07/23/2021   LDLCALC 39 07/23/2021   TRIG 83 07/23/2021    Wt Readings from Last 3 Encounters:  08/31/21 Marland Kitchen)  313 lb (142 kg)  07/23/21 299 lb 12.8 oz (136 kg)  01/19/21 (!) 318 lb 3.2 oz (144.3 kg)     ASSESSMENT AND PLAN:  Coronary artery disease of bypass graft of native heart with stable angina pectoris (HCC) -  Currently with no symptoms of angina. No further workup at this time. Continue current medication regimen.  Mixed hyperlipidemia - Plan: EKG 12-Lead Cholesterol is at goal on the current lipid regimen. No changes to the medications were made.  Essential hypertension - Plan: EKG 12-Lead Blood pressure is well controlled on today's visit. No changes made to the medications.  Type 2 diabetes mellitus without complication, with long-term current use of insulin ([place]) - Plan: EKG 12-Lead  hemoglobin A 1C is 6.4, weight up since then We have encouraged continued exercise, careful diet management in an effort to lose weight.  Morbid obesity ([place]) - Plan: EKG 12-Lead  weight higher Increase his protein, walking program, diet modification  Erectile dysfunction Managed by PMD    Total encounter time more than 30 minutes  Greater than 50% was spent in counseling and coordination of care with the patient   Orders Placed This Encounter   Procedures   EKG 12-Lead     Signed, Esmond Plants, M.D., Ph.D. 08/31/2021  [place], [place]

## 2021-08-31 ENCOUNTER — Ambulatory Visit (INDEPENDENT_AMBULATORY_CARE_PROVIDER_SITE_OTHER): Payer: Medicaid Other | Admitting: Cardiovascular Disease

## 2021-08-31 ENCOUNTER — Encounter: Payer: Self-pay | Admitting: Cardiovascular Disease

## 2021-08-31 ENCOUNTER — Other Ambulatory Visit: Payer: Self-pay

## 2021-08-31 ENCOUNTER — Telehealth: Payer: Self-pay | Admitting: Family Medicine

## 2021-08-31 VITALS — BP 120/70 | HR 81 | Ht 68.0 in | Wt 313.0 lb

## 2021-08-31 DIAGNOSIS — I10 Essential (primary) hypertension: Secondary | ICD-10-CM | POA: Diagnosis not present

## 2021-08-31 DIAGNOSIS — E785 Hyperlipidemia, unspecified: Secondary | ICD-10-CM

## 2021-08-31 DIAGNOSIS — E1151 Type 2 diabetes mellitus with diabetic peripheral angiopathy without gangrene: Secondary | ICD-10-CM | POA: Diagnosis not present

## 2021-08-31 DIAGNOSIS — G4733 Obstructive sleep apnea (adult) (pediatric): Secondary | ICD-10-CM | POA: Diagnosis not present

## 2021-08-31 DIAGNOSIS — I2510 Atherosclerotic heart disease of native coronary artery without angina pectoris: Secondary | ICD-10-CM | POA: Diagnosis not present

## 2021-08-31 DIAGNOSIS — E1169 Type 2 diabetes mellitus with other specified complication: Secondary | ICD-10-CM

## 2021-08-31 MED ORDER — ATORVASTATIN CALCIUM 80 MG PO TABS: 80.0000 mg | ORAL_TABLET | Freq: Every day | ORAL | 3 refills | Status: DC

## 2021-08-31 NOTE — Telephone Encounter (Signed)
.. °  Medicaid Managed Care   Unsuccessful Outreach Note  08/31/2021 Name: SUHAS ESTIS MRN: [id number] DOB: 04-Mar-1956  Referred by: Smitty Cords, DO Reason for referral : High Risk Managed Medicaid (I called the patient today to get him rescheduled with the MM RNCM. I left my name and number on his VM.)   An unsuccessful telephone outreach was attempted today. The patient was referred to the case management team for assistance with care management and care coordination.   Follow Up Plan: The care management team will reach out to the patient again over the next 7 days.    [name] Care Guide, High Risk Medicaid Managed Care Embedded Care Coordination [facility]   [facility]

## 2021-08-31 NOTE — Patient Instructions (Signed)
Medication Instructions:  No changes  If you need a refill on your cardiac medications before your next appointment, please call your pharmacy.   Lab work: No new labs needed  Testing/Procedures: No new testing needed  Follow-Up: At [facility], you and your health needs are our priority.  As part of our continuing mission to provide you with exceptional heart care, we have created designated Provider Care Teams.  These Care Teams include your primary Cardiologist (physician) and Advanced Practice Providers (APPs -  Physician Assistants and Nurse Practitioners) who all work together to provide you with the care you need, when you need it.  You will need a follow up appointment in 12 months  Providers on your designated Care Team:   Christopher Berge, NP Ryan Dunn, PA-C Cadence Furth, PA-C  COVID-19 Vaccine Information can be found at: [email or web address] For questions related to vaccine distribution or appointments, please email [email or web address] or call [phone].   

## 2021-09-08 ENCOUNTER — Other Ambulatory Visit: Payer: Medicaid Other

## 2021-09-08 NOTE — Patient Instructions (Signed)
Craig Harvey,  ? ?Thank you for your participation in the [facility] Management program and allowing me to assist with your care.  We trust you found the additional care team support helpful in managing your health care needs.  I am writing this to you to let you know that your care management case has been closed effective today, 09/08/2021, due to:  You have chosen to discontinue our services.   ? ?It was a pleasure working with you and I hope you found our services helpful.  I have notified your primary care physician, Smitty Cords, DO of your case closure.  If you have any questions about being discharged from care management or need further assistance, please call us at [phone] (toll free). ? ?Thank you,  ? ?Virgina Norfolk RN, BSN ?Community Care Coordinator ?[place]  [facility] ?Mobile: [phone]   ?

## 2021-09-08 NOTE — Patient Outreach (Addendum)
Care Coordination ? ?09/08/2021 ? ?[name] ?1956/05/23 ?[id number] ? ? ?This Medical illustrator received message from [name], High Risk Managed Medicaid scheduler, stating that the patient, Craig Harvey, has requested to disenroll from the [employer] Risk Managed Medicaid program.  The patient has chosen to discontinue our services at this time.  ? ?A letter will be sent to the patient notifying him of the case closure effective today 09/08/2021. ? ?If you have any questions about being discharged from care management or need further assistance, please call us at [phone] (toll free). ? ?Mike Craze, BSN ?Community Care Coordinator ?[place]  [facility] ?Mobile: [phone]  ?

## 2021-10-20 ENCOUNTER — Other Ambulatory Visit: Payer: Self-pay | Admitting: Family Medicine

## 2021-10-20 DIAGNOSIS — E1151 Type 2 diabetes mellitus with diabetic peripheral angiopathy without gangrene: Secondary | ICD-10-CM

## 2021-10-21 NOTE — Telephone Encounter (Signed)
Requested medication (s) are due for refill today:   Yes ? ?Requested medication (s) are on the active medication list:   Yes ? ?Future visit scheduled:   Yes ? ? ?Last ordered: 04/2021 #100, 2 refills ? ?Returned because there isn't a protocol assigned to this request.  ? ?Requested Prescriptions  ?Pending Prescriptions Disp Refills  ? BD VEO INSULIN SYRINGE U/F 31G X 15/64" 0.3 ML MISC [Pharmacy Med Name: BD INS SYR 0.3/31G/6MM MIS] 100 each 0  ?  Sig: USE AS DIRECTED TWICE DAILY  ?  ? There is no refill protocol information for this order  ?  ? ?

## 2021-11-19 ENCOUNTER — Ambulatory Visit: Payer: Self-pay | Admitting: *Deleted

## 2021-11-19 DIAGNOSIS — R519 Headache, unspecified: Secondary | ICD-10-CM | POA: Diagnosis not present

## 2021-11-19 DIAGNOSIS — R42 Dizziness and giddiness: Secondary | ICD-10-CM | POA: Diagnosis not present

## 2021-11-19 DIAGNOSIS — I10 Essential (primary) hypertension: Secondary | ICD-10-CM | POA: Diagnosis not present

## 2021-11-19 NOTE — Telephone Encounter (Signed)
Reason for Disposition  [1] MODERATE dizziness (e.g., interferes with normal activities) AND [2] has NOT been evaluated by physician for this  (Exception: dizziness caused by heat exposure, sudden standing, or poor fluid intake)  Answer Assessment - Initial Assessment Questions 1. DESCRIPTION: "Describe your dizziness."     faint 2. LIGHTHEADED: "Do you feel lightheaded?" (e.g., somewhat faint, woozy, weak upon standing)     Somewhat faint- hydration helped but patient is still feeling weak, fatigued 3. VERTIGO: "Do you feel like either you or the room is spinning or tilting?" (i.e. vertigo)     no 4. SEVERITY: "How bad is it?"  "Do you feel like you are going to faint?" "Can you stand and walk?"   - MILD: Feels slightly dizzy, but walking normally.   - MODERATE: Feels unsteady when walking, but not falling; interferes with normal activities (e.g., school, work).   - SEVERE: Unable to walk without falling, or requires assistance to walk without falling; feels like passing out now.      None/moderate- patient is avoiding some activities 5. ONSET:  "When did the dizziness begin?"     Monday- has improved over the week- but still having symptoms 6. AGGRAVATING FACTORS: "Does anything make it worse?" (e.g., standing, change in head position)     Improved since Monday 7. HEART RATE: "Can you tell me your heart rate?" "How many beats in 15 seconds?"  (Note: not all patients can do this)       Patient reports BP/P  139/84 74, glucose 124 fasting 8. CAUSE: "What do you think is causing the dizziness?"     Possible dehydration 9. RECURRENT SYMPTOM: "Have you had dizziness before?" If Yes, ask: "When was the last time?" "What happened that time?"     No- before patient has been overheated 10. OTHER SYMPTOMS: "Do you have any other symptoms?" (e.g., fever, chest pain, vomiting, diarrhea, bleeding)       no 11. PREGNANCY: "Is there any chance you are pregnant?" "When was your last menstrual  period?"  Protocols used: Dizziness - Lightheadedness-A-AH

## 2021-11-19 NOTE — Telephone Encounter (Signed)
  Chief Complaint: dizziness Symptoms: dizziness, fatigue, weakness- patient has been hydrating over the last few days and symptoms have improved- but not subsided Frequency: started Monday Pertinent Negatives: Patient denies fever, chest pain, vomiting, diarrhea, bleeding Disposition: [] ED /[x] Urgent Care (no appt availability in office) / [] Appointment(In office/virtual)/ []  [facility]/ [] Home Care/ [] Refused Recommended Disposition /[] [place] [facility]/ []  Follow-up with PCP Additional Notes: No open appointment- advised UC

## 2021-12-07 ENCOUNTER — Emergency Department: Payer: Medicaid Other

## 2021-12-07 ENCOUNTER — Inpatient Hospital Stay
Admission: EM | Admit: 2021-12-07 | Discharge: 2021-12-09 | DRG: 303 | Disposition: A | Discharge status: Home / Self Care | Payer: Medicaid Other | Attending: Internal Medicine | Admitting: Internal Medicine

## 2021-12-07 ENCOUNTER — Encounter: Payer: Self-pay | Admitting: Emergency Medicine

## 2021-12-07 ENCOUNTER — Other Ambulatory Visit: Payer: Self-pay

## 2021-12-07 DIAGNOSIS — E119 Type 2 diabetes mellitus without complications: Secondary | ICD-10-CM | POA: Diagnosis not present

## 2021-12-07 DIAGNOSIS — R079 Chest pain, unspecified: Secondary | ICD-10-CM | POA: Diagnosis not present

## 2021-12-07 DIAGNOSIS — I10 Essential (primary) hypertension: Secondary | ICD-10-CM

## 2021-12-07 DIAGNOSIS — Z7902 Long term (current) use of antithrombotics/antiplatelets: Secondary | ICD-10-CM

## 2021-12-07 DIAGNOSIS — R0789 Other chest pain: Secondary | ICD-10-CM | POA: Diagnosis not present

## 2021-12-07 DIAGNOSIS — I200 Unstable angina: Secondary | ICD-10-CM | POA: Diagnosis present

## 2021-12-07 DIAGNOSIS — E785 Hyperlipidemia, unspecified: Secondary | ICD-10-CM | POA: Diagnosis present

## 2021-12-07 DIAGNOSIS — R52 Pain, unspecified: Secondary | ICD-10-CM | POA: Diagnosis not present

## 2021-12-07 DIAGNOSIS — I2510 Atherosclerotic heart disease of native coronary artery without angina pectoris: Secondary | ICD-10-CM

## 2021-12-07 DIAGNOSIS — I440 Atrioventricular block, first degree: Secondary | ICD-10-CM | POA: Diagnosis present

## 2021-12-07 DIAGNOSIS — Z794 Long term (current) use of insulin: Secondary | ICD-10-CM | POA: Diagnosis not present

## 2021-12-07 DIAGNOSIS — Z951 Presence of aortocoronary bypass graft: Secondary | ICD-10-CM

## 2021-12-07 DIAGNOSIS — Z79899 Other long term (current) drug therapy: Secondary | ICD-10-CM

## 2021-12-07 DIAGNOSIS — Z7982 Long term (current) use of aspirin: Secondary | ICD-10-CM

## 2021-12-07 DIAGNOSIS — Z87891 Personal history of nicotine dependence: Secondary | ICD-10-CM

## 2021-12-07 DIAGNOSIS — R072 Precordial pain: Secondary | ICD-10-CM

## 2021-12-07 DIAGNOSIS — I214 Non-ST elevation (NSTEMI) myocardial infarction: Secondary | ICD-10-CM | POA: Diagnosis present

## 2021-12-07 DIAGNOSIS — E1151 Type 2 diabetes mellitus with diabetic peripheral angiopathy without gangrene: Secondary | ICD-10-CM

## 2021-12-07 DIAGNOSIS — Z6841 Body Mass Index (BMI) 40.0 and over, adult: Secondary | ICD-10-CM

## 2021-12-07 DIAGNOSIS — Z7984 Long term (current) use of oral hypoglycemic drugs: Secondary | ICD-10-CM

## 2021-12-07 DIAGNOSIS — Z808 Family history of malignant neoplasm of other organs or systems: Secondary | ICD-10-CM

## 2021-12-07 DIAGNOSIS — Z8249 Family history of ischemic heart disease and other diseases of the circulatory system: Secondary | ICD-10-CM

## 2021-12-07 DIAGNOSIS — I25110 Atherosclerotic heart disease of native coronary artery with unstable angina pectoris: Principal | ICD-10-CM | POA: Diagnosis present

## 2021-12-07 DIAGNOSIS — G4733 Obstructive sleep apnea (adult) (pediatric): Secondary | ICD-10-CM | POA: Diagnosis present

## 2021-12-07 LAB — CBC WITH DIFFERENTIAL/PLATELET
Abs Immature Granulocytes: 0.02 10*3/uL (ref 0.00–0.07)
Basophils Absolute: 0 10*3/uL (ref 0.0–0.1)
Basophils Relative: 1 %
Eosinophils Absolute: 0 10*3/uL (ref 0.0–0.5)
Eosinophils Relative: 1 %
HCT: 38.2 % — ABNORMAL LOW (ref 39.0–52.0)
Hemoglobin: 12.5 g/dL — ABNORMAL LOW (ref 13.0–17.0)
Immature Granulocytes: 0 %
Lymphocytes Relative: 22 %
Lymphs Abs: 1.1 10*3/uL (ref 0.7–4.0)
MCH: 27.4 pg (ref 26.0–34.0)
MCHC: 32.7 g/dL (ref 30.0–36.0)
MCV: 83.6 fL (ref 80.0–100.0)
Monocytes Absolute: 0.5 10*3/uL (ref 0.1–1.0)
Monocytes Relative: 9 %
Neutro Abs: 3.5 10*3/uL (ref 1.7–7.7)
Neutrophils Relative %: 67 %
Platelets: 176 10*3/uL (ref 150–400)
RBC: 4.57 MIL/uL (ref 4.22–5.81)
RDW: 14 % (ref 11.5–15.5)
WBC: 5.2 10*3/uL (ref 4.0–10.5)
nRBC: 0 % (ref 0.0–0.2)

## 2021-12-07 MED ORDER — SODIUM CHLORIDE 0.9 % IV BOLUS
1000.0000 mL | Freq: Once | INTRAVENOUS | Status: AC
  Administered 2021-12-07: 1000 mL via INTRAVENOUS

## 2021-12-07 MED ORDER — FAMOTIDINE 20 MG PO TABS
40.0000 mg | ORAL_TABLET | Freq: Once | ORAL | Status: AC
  Administered 2021-12-07: 40 mg via ORAL
  Filled 2021-12-07: qty 2

## 2021-12-07 MED ORDER — NITROGLYCERIN 0.4 MG SL SUBL
0.4000 mg | SUBLINGUAL_TABLET | SUBLINGUAL | Status: DC | PRN
  Administered 2021-12-08: 0.4 mg via SUBLINGUAL
  Filled 2021-12-07: qty 1

## 2021-12-07 MED ORDER — ALUM & MAG HYDROXIDE-SIMETH 200-200-20 MG/5ML PO SUSP
30.0000 mL | Freq: Once | ORAL | Status: AC
  Administered 2021-12-07: 30 mL via ORAL
  Filled 2021-12-07: qty 30

## 2021-12-07 NOTE — ED Provider Notes (Signed)
[facility] Provider Note    Event Date/Time   First MD Initiated Contact with Patient 12/07/21 2203     (approximate)   History   Chief Complaint: Chest Pain   HPI  DARRLY LOBERG is a 66 y.o. male with a history of hypertension hyperlipidemia type 2 diabetes CAD status post CABG and morbid obesity who comes ED complaining of chest tightness that started at 8:00 PM tonight while patient was at rest, using his computer.  No shortness of breath diaphoresis vomiting or radiation.  No recent exertional symptoms.  He normally exercises on an exercise bike.  No cough, not pleuritic.  No leg swelling.  No fever     Physical Exam   Triage Vital Signs: ED Triage Vitals  Enc Vitals Group     BP 12/07/21 2215 (!) 142/87     Pulse Rate 12/07/21 2215 71     Resp 12/07/21 2215 14     Temp 12/07/21 2215 98.1 F (36.7 C)     Temp Source 12/07/21 2215 Oral     SpO2 12/07/21 2210 98 %     Weight 12/07/21 2217 (!) 302 lb (137 kg)     Height 12/07/21 2217 5\' 8"  (1.727 m)     Head Circumference --      Peak Flow --      Pain Score 12/07/21 2216 0     Pain Loc --      Pain Edu? --      Excl. in GC? --     Most recent vital signs: Vitals:   12/07/21 2210 12/07/21 2215  BP:  (!) 142/87  Pulse:  71  Resp:  14  Temp:  98.1 F (36.7 C)  SpO2: 98% 99%    General: Awake, no distress.  CV:  Good peripheral perfusion.  Regular rate and rhythm Resp:  Normal effort.  Clear to auscultation bilaterally Abd:  No distention.  Soft and nontender Other:  No lower extremity edema.  Symmetric calf circumference, no calf tenderness.   ED Results / Procedures / Treatments   Labs (all labs ordered are listed, but only abnormal results are displayed) Labs Reviewed  CBC WITH DIFFERENTIAL/PLATELET  COMPREHENSIVE METABOLIC PANEL  TROPONIN I (HIGH SENSITIVITY)     EKG Outpatient EKG performed today at 1:40 PM viewed and interpreted by me, shows sinus rhythm, rate of  70.  Normal axis.  First-degree AV block of 265 ms.  Poor R wave progression.  Normal ST segments and T waves.  No ischemic changes.   RADIOLOGY Chest x-ray viewed and interpreted by me, appears normal.  No consolidation or pneumothorax.  Radiology report reviewed   PROCEDURES:  Procedures   MEDICATIONS ORDERED IN ED: Medications  alum & mag hydroxide-simeth (MAALOX/MYLANTA) 200-200-20 MG/5ML suspension 30 mL (has no administration in time range)  famotidine (PEPCID) tablet 40 mg (has no administration in time range)  nitroGLYCERIN (NITROSTAT) SL tablet 0.4 mg (has no administration in time range)  sodium chloride 0.9 % bolus 1,000 mL (has no administration in time range)     IMPRESSION / MDM / ASSESSMENT AND PLAN / ED COURSE  I reviewed the triage vital signs and the nursing notes.                              Differential diagnosis includes, but is not limited to, non-STEMI, GERD, pneumonia, pneumothorax, bronchospasm  Patient's presentation is most consistent  with acute presentation with potential threat to life or bodily function.  Patient with multiple cardiac risk factors comes ED complaining of chest tightness.  Low suspicion for dissection PE pericardial effusion pericarditis esophageal rupture.  He is not in distress, nontoxic and not septic.  Vital signs are unremarkable.  We will repeat EKG, obtain chest x-ray and labs.  Will give medications in attempt to resolve pain.  If pain can be resolved and serial troponins are reassuring, I think patient will not require admission and can be discharged home to outpatient follow-up.    Review of outside records shows that he saw cardiology Dr. Mariah Milling on August 31, 2021 which notes that he is on Trulicity, having side effects including stomach pain and difficulty eating which may be related to current symptoms.      FINAL CLINICAL IMPRESSION(S) / ED DIAGNOSES   Final diagnoses:  Nonspecific chest pain  Type 2 diabetes  mellitus without complication, with long-term current use of insulin (HCC)  Morbid obesity (HCC)     Rx / DC Orders   ED Discharge Orders     None        Note:  This document was prepared using Dragon voice recognition software and may include unintentional dictation errors.   Sharman Cheek, MD 12/07/21 2242

## 2021-12-07 NOTE — ED Triage Notes (Signed)
Pt arrives from home via [facility], c/o chest tightness and discomfort. 324 aspirin given by EMS and 2 nitro sprays.  Pt reports relief.  HX CBG 6 yrs ago.  PT ambulatory and NAD at this time.

## 2021-12-08 ENCOUNTER — Inpatient Hospital Stay (HOSPITAL_COMMUNITY)
Admit: 2021-12-08 | Discharge: 2021-12-08 | Disposition: A | Discharge status: Home / Self Care | Payer: Medicaid Other | Attending: Family Medicine | Admitting: Family Medicine

## 2021-12-08 ENCOUNTER — Encounter: Payer: Self-pay | Admitting: Family Medicine

## 2021-12-08 DIAGNOSIS — E785 Hyperlipidemia, unspecified: Secondary | ICD-10-CM | POA: Diagnosis not present

## 2021-12-08 DIAGNOSIS — I214 Non-ST elevation (NSTEMI) myocardial infarction: Secondary | ICD-10-CM | POA: Diagnosis present

## 2021-12-08 DIAGNOSIS — I200 Unstable angina: Secondary | ICD-10-CM | POA: Diagnosis not present

## 2021-12-08 DIAGNOSIS — Z87891 Personal history of nicotine dependence: Secondary | ICD-10-CM | POA: Diagnosis not present

## 2021-12-08 DIAGNOSIS — Z955 Presence of coronary angioplasty implant and graft: Secondary | ICD-10-CM | POA: Diagnosis not present

## 2021-12-08 DIAGNOSIS — Z7984 Long term (current) use of oral hypoglycemic drugs: Secondary | ICD-10-CM | POA: Diagnosis not present

## 2021-12-08 DIAGNOSIS — E119 Type 2 diabetes mellitus without complications: Secondary | ICD-10-CM | POA: Diagnosis not present

## 2021-12-08 DIAGNOSIS — Z794 Long term (current) use of insulin: Secondary | ICD-10-CM

## 2021-12-08 DIAGNOSIS — I2510 Atherosclerotic heart disease of native coronary artery without angina pectoris: Secondary | ICD-10-CM | POA: Diagnosis not present

## 2021-12-08 DIAGNOSIS — R072 Precordial pain: Secondary | ICD-10-CM | POA: Diagnosis not present

## 2021-12-08 DIAGNOSIS — I440 Atrioventricular block, first degree: Secondary | ICD-10-CM | POA: Diagnosis not present

## 2021-12-08 DIAGNOSIS — R0789 Other chest pain: Secondary | ICD-10-CM | POA: Diagnosis not present

## 2021-12-08 DIAGNOSIS — R001 Bradycardia, unspecified: Secondary | ICD-10-CM | POA: Diagnosis not present

## 2021-12-08 DIAGNOSIS — Z7982 Long term (current) use of aspirin: Secondary | ICD-10-CM | POA: Diagnosis not present

## 2021-12-08 DIAGNOSIS — I10 Essential (primary) hypertension: Secondary | ICD-10-CM

## 2021-12-08 DIAGNOSIS — Z7902 Long term (current) use of antithrombotics/antiplatelets: Secondary | ICD-10-CM | POA: Diagnosis not present

## 2021-12-08 DIAGNOSIS — R079 Chest pain, unspecified: Secondary | ICD-10-CM

## 2021-12-08 DIAGNOSIS — I25110 Atherosclerotic heart disease of native coronary artery with unstable angina pectoris: Secondary | ICD-10-CM | POA: Diagnosis not present

## 2021-12-08 DIAGNOSIS — Z8249 Family history of ischemic heart disease and other diseases of the circulatory system: Secondary | ICD-10-CM | POA: Diagnosis not present

## 2021-12-08 DIAGNOSIS — I455 Other specified heart block: Secondary | ICD-10-CM | POA: Diagnosis not present

## 2021-12-08 DIAGNOSIS — Z808 Family history of malignant neoplasm of other organs or systems: Secondary | ICD-10-CM | POA: Diagnosis not present

## 2021-12-08 DIAGNOSIS — G4733 Obstructive sleep apnea (adult) (pediatric): Secondary | ICD-10-CM | POA: Diagnosis not present

## 2021-12-08 DIAGNOSIS — Z79899 Other long term (current) drug therapy: Secondary | ICD-10-CM | POA: Diagnosis not present

## 2021-12-08 DIAGNOSIS — Z6841 Body Mass Index (BMI) 40.0 and over, adult: Secondary | ICD-10-CM | POA: Diagnosis not present

## 2021-12-08 DIAGNOSIS — Z951 Presence of aortocoronary bypass graft: Secondary | ICD-10-CM | POA: Diagnosis not present

## 2021-12-08 LAB — CBC
HCT: 37.5 % — ABNORMAL LOW (ref 39.0–52.0)
HCT: 39.7 % (ref 39.0–52.0)
Hemoglobin: 12.3 g/dL — ABNORMAL LOW (ref 13.0–17.0)
Hemoglobin: 12.8 g/dL — ABNORMAL LOW (ref 13.0–17.0)
MCH: 27.5 pg (ref 26.0–34.0)
MCH: 27.3 pg (ref 26.0–34.0)
MCHC: 32.8 g/dL (ref 30.0–36.0)
MCHC: 32.2 g/dL (ref 30.0–36.0)
MCV: 83.9 fL (ref 80.0–100.0)
MCV: 84.6 fL (ref 80.0–100.0)
Platelets: 147 10*3/uL — ABNORMAL LOW (ref 150–400)
Platelets: 164 10*3/uL (ref 150–400)
RBC: 4.47 MIL/uL (ref 4.22–5.81)
RBC: 4.69 MIL/uL (ref 4.22–5.81)
RDW: 14.1 % (ref 11.5–15.5)
RDW: 14.2 % (ref 11.5–15.5)
WBC: 4.5 10*3/uL (ref 4.0–10.5)
WBC: 4.2 10*3/uL (ref 4.0–10.5)
nRBC: 0 % (ref 0.0–0.2)
nRBC: 0 % (ref 0.0–0.2)

## 2021-12-08 LAB — COMPREHENSIVE METABOLIC PANEL
ALT: 43 U/L (ref 0–44)
AST: 36 U/L (ref 15–41)
Albumin: 4.1 g/dL (ref 3.5–5.0)
Alkaline Phosphatase: 43 U/L (ref 38–126)
Anion gap: 9 (ref 5–15)
BUN: 16 mg/dL (ref 8–23)
CO2: 22 mmol/L (ref 22–32)
Calcium: 9.3 mg/dL (ref 8.9–10.3)
Chloride: 98 mmol/L (ref 98–111)
Creatinine, Ser: 0.82 mg/dL (ref 0.61–1.24)
GFR, Estimated: >60 mL/min (ref >60)
Glucose, Bld: 100 mg/dL — ABNORMAL HIGH (ref 70–99)
Potassium: 3.9 mmol/L (ref 3.5–5.1)
Sodium: 129 mmol/L — ABNORMAL LOW (ref 135–145)
Total Bilirubin: 0.6 mg/dL (ref 0.3–1.2)
Total Protein: 7.5 g/dL (ref 6.5–8.1)

## 2021-12-08 LAB — PROTIME-INR
INR: 1.2 (ref 0.8–1.2)
Prothrombin Time: 15.2 seconds (ref 11.4–15.2)

## 2021-12-08 LAB — BASIC METABOLIC PANEL
Anion gap: 8 (ref 5–15)
BUN: 11 mg/dL (ref 8–23)
CO2: 24 mmol/L (ref 22–32)
Calcium: 9.2 mg/dL (ref 8.9–10.3)
Chloride: 100 mmol/L (ref 98–111)
Creatinine, Ser: 0.76 mg/dL (ref 0.61–1.24)
GFR, Estimated: >60 mL/min (ref >60)
Glucose, Bld: 117 mg/dL — ABNORMAL HIGH (ref 70–99)
Potassium: 4.1 mmol/L (ref 3.5–5.1)
Sodium: 132 mmol/L — ABNORMAL LOW (ref 135–145)

## 2021-12-08 LAB — CBG MONITORING, ED
Glucose-Capillary: 113 mg/dL — ABNORMAL HIGH (ref 70–99)
Glucose-Capillary: 115 mg/dL — ABNORMAL HIGH (ref 70–99)
Glucose-Capillary: 166 mg/dL — ABNORMAL HIGH (ref 70–99)
Glucose-Capillary: 118 mg/dL — ABNORMAL HIGH (ref 70–99)

## 2021-12-08 LAB — APTT: aPTT: 32 seconds (ref 24–36)

## 2021-12-08 LAB — HEPARIN LEVEL (UNFRACTIONATED)
Heparin Unfractionated: 0.28 IU/mL — ABNORMAL LOW (ref 0.30–0.70)
Heparin Unfractionated: 0.59 IU/mL (ref 0.30–0.70)
Heparin Unfractionated: 0.43 IU/mL (ref 0.30–0.70)

## 2021-12-08 LAB — GLUCOSE, CAPILLARY: Glucose-Capillary: 172 mg/dL — ABNORMAL HIGH (ref 70–99)

## 2021-12-08 LAB — LIPID PANEL
Cholesterol: 88 mg/dL (ref 0–200)
HDL: 34 mg/dL — ABNORMAL LOW (ref >40)
LDL Cholesterol: 41 mg/dL (ref 0–99)
Total CHOL/HDL Ratio: 2.6 RATIO
Triglycerides: 63 mg/dL (ref <150)
VLDL: 13 mg/dL (ref 0–40)

## 2021-12-08 LAB — TROPONIN I (HIGH SENSITIVITY)
Troponin I (High Sensitivity): 31 ng/L — ABNORMAL HIGH (ref <18)
Troponin I (High Sensitivity): 30 ng/L — ABNORMAL HIGH (ref <18)
Troponin I (High Sensitivity): 32 ng/L — ABNORMAL HIGH (ref <18)
Troponin I (High Sensitivity): 31 ng/L — ABNORMAL HIGH (ref <18)

## 2021-12-08 LAB — TSH: TSH: 0.888 u[IU]/mL (ref 0.350–4.500)

## 2021-12-08 LAB — HIV ANTIBODY (ROUTINE TESTING W REFLEX): HIV Screen 4th Generation wRfx: NONREACTIVE

## 2021-12-08 MED ORDER — EZETIMIBE 10 MG PO TABS
10.0000 mg | ORAL_TABLET | Freq: Every day | ORAL | Status: DC
  Administered 2021-12-08 – 2021-12-09 (×2): 10 mg via ORAL
  Filled 2021-12-08 (×2): qty 1

## 2021-12-08 MED ORDER — INSULIN ASPART 100 UNIT/ML IJ SOLN
0.0000 [IU] | INTRAMUSCULAR | Status: DC
  Administered 2021-12-08 – 2021-12-09 (×2): 4 [IU] via SUBCUTANEOUS
  Filled 2021-12-08 (×2): qty 1

## 2021-12-08 MED ORDER — METOPROLOL SUCCINATE ER 50 MG PO TB24
50.0000 mg | ORAL_TABLET | Freq: Every day | ORAL | Status: DC
  Administered 2021-12-08 – 2021-12-09 (×2): 50 mg via ORAL
  Filled 2021-12-08 (×2): qty 1

## 2021-12-08 MED ORDER — ISOSORBIDE MONONITRATE ER 60 MG PO TB24
30.0000 mg | ORAL_TABLET | Freq: Every day | ORAL | Status: DC
  Administered 2021-12-08: 30 mg via ORAL
  Filled 2021-12-08 (×2): qty 1

## 2021-12-08 MED ORDER — ALPRAZOLAM 0.25 MG PO TABS: 0.2500 mg | ORAL_TABLET | Freq: Two times a day (BID) | ORAL | Status: DC | PRN

## 2021-12-08 MED ORDER — ISOSORBIDE MONONITRATE ER 60 MG PO TB24
60.0000 mg | ORAL_TABLET | Freq: Every day | ORAL | Status: DC
  Administered 2021-12-09: 60 mg via ORAL
  Filled 2021-12-08: qty 1

## 2021-12-08 MED ORDER — ATORVASTATIN CALCIUM 80 MG PO TABS
80.0000 mg | ORAL_TABLET | Freq: Every day | ORAL | Status: DC
  Administered 2021-12-08 – 2021-12-09 (×2): 80 mg via ORAL
  Filled 2021-12-08: qty 1
  Filled 2021-12-08: qty 4

## 2021-12-08 MED ORDER — HEPARIN (PORCINE) 25000 UT/250ML-% IV SOLN
1500.0000 [IU]/h | INTRAVENOUS | Status: DC
  Administered 2021-12-08: 1400 [IU]/h via INTRAVENOUS
  Administered 2021-12-08 – 2021-12-09 (×2): 1600 [IU]/h via INTRAVENOUS
  Filled 2021-12-08 (×3): qty 250

## 2021-12-08 MED ORDER — ONDANSETRON HCL 4 MG/2ML IJ SOLN: 4.0000 mg | Freq: Four times a day (QID) | INTRAMUSCULAR | Status: DC | PRN

## 2021-12-08 MED ORDER — HEPARIN BOLUS VIA INFUSION
4000.0000 [IU] | Freq: Once | INTRAVENOUS | Status: AC
  Administered 2021-12-08: 4000 [IU] via INTRAVENOUS
  Filled 2021-12-08: qty 4000

## 2021-12-08 MED ORDER — ASPIRIN 81 MG PO TBEC
81.0000 mg | DELAYED_RELEASE_TABLET | Freq: Every day | ORAL | Status: DC
  Administered 2021-12-08 – 2021-12-09 (×2): 81 mg via ORAL
  Filled 2021-12-08 (×2): qty 1

## 2021-12-08 MED ORDER — LISINOPRIL 10 MG PO TABS
10.0000 mg | ORAL_TABLET | Freq: Every day | ORAL | Status: DC
  Administered 2021-12-08 – 2021-12-09 (×2): 10 mg via ORAL
  Filled 2021-12-08 (×2): qty 1

## 2021-12-08 MED ORDER — BACLOFEN 10 MG PO TABS: 5.0000 mg | ORAL_TABLET | Freq: Two times a day (BID) | ORAL | Status: DC | PRN

## 2021-12-08 MED ORDER — ACETAMINOPHEN 325 MG PO TABS: 650.0000 mg | ORAL_TABLET | ORAL | Status: DC | PRN

## 2021-12-08 MED ORDER — HEPARIN BOLUS VIA INFUSION
1500.0000 [IU] | Freq: Once | INTRAVENOUS | Status: AC
  Administered 2021-12-08: 1500 [IU] via INTRAVENOUS
  Filled 2021-12-08: qty 1500

## 2021-12-08 MED ORDER — HYDROCHLOROTHIAZIDE 25 MG PO TABS
25.0000 mg | ORAL_TABLET | Freq: Every day | ORAL | Status: DC
  Administered 2021-12-08 – 2021-12-09 (×2): 25 mg via ORAL
  Filled 2021-12-08 (×2): qty 1

## 2021-12-08 MED ORDER — ISOSORBIDE MONONITRATE ER 60 MG PO TB24
30.0000 mg | ORAL_TABLET | Freq: Once | ORAL | Status: AC
  Administered 2021-12-08: 30 mg via ORAL
  Filled 2021-12-08: qty 1

## 2021-12-08 NOTE — Progress Notes (Signed)
ANTICOAGULATION CONSULT NOTE - Initial Consult  Pharmacy Consult for Hepairn  Indication: chest pain/ACS  No Known Allergies  Patient Measurements: Height: 5\' 8"  (172.7 cm) Weight: (!) 137 kg (302 lb) IBW/kg (Calculated) : 68.4 Heparin Dosing Weight: 100.9 kg   Vital Signs: Temp: 98.1 F (36.7 C) (06/05 2215) Temp Source: Oral (06/05 2215) BP: 141/83 (06/06 0000) Pulse Rate: 66 (06/06 0000)  Labs: Recent Labs    12/07/21 2328  HGB 12.5*  HCT 38.2*  PLT 176  CREATININE 0.82  TROPONINIHS 31*    Estimated Creatinine Clearance: 124.9 mL/min (by C-G formula based on SCr of 0.82 mg/dL).   Medical History: Past Medical History:  Diagnosis Date   CAD (coronary artery disease)    a. 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 cardiac cath 01/09/2015: ostLAD 70:, mLAD 99%, ost to pLCx 95%, mLCx 80%, LVEF nl, recommend CABGl; c. s/p 3v CABG 01/14/15 LIMA-LAD, SVG-OM, SVG-Ramus   History of echocardiogram    a. 01/2015 Echo: EF 60-65%, no rwma, nl RV fxn. Nl PASP.   Hyperlipidemia LDL goal <70    Hypertension    Morbid obesity (HCC)    OSA (obstructive sleep apnea)    Type II diabetes mellitus (HCC)     Medications:  (Not in a hospital admission)   Assessment: Pharmacy consulted to dose heparin in this 66 year old male admitted with ACS/NSTEMI.  No prior anticoag noted. CrCl = 124.9 ml/min  Goal of Therapy:  Heparin level 0.3-0.7 units/ml Monitor platelets by anticoagulation protocol: Yes   Plan:  Give 4000 units bolus x 1 Start heparin infusion at 1400 units/hr Check anti-Xa level in 6 hours and daily while on heparin Continue to monitor H&H and platelets  Deijah Spikes D 12/08/2021,1:02 AM

## 2021-12-08 NOTE — Progress Notes (Signed)
Interval events noted.  Chest tightness has improved.  No shortness of breath.  He feels much better.  Continue IV heparin drip.  Dr. Okey Dupre, cardiologist, has been consulted to assist with management.

## 2021-12-08 NOTE — Consult Note (Signed)
Cardiology Consultation:   Patient ID: [name] MRN: [id number]; DOB: 07/01/1956  Admit date: 12/07/2021 Date of Consult: 12/08/2021  PCP:  Smitty Cords, DO   [facility] Providers Cardiologist:  Julien Nordmann, MD      Patient Profile:   Craig Harvey is a 66 y.o. male with a hx of coronary artery disease status post three-vessel CABG in 2016 (LIMA-LAD, SVG-ramus, SVG-LCx), hypertension, hyperlipidemia, type 2 diabetes mellitus, obstructive sleep apnea on CPAP, and morbid obesity, who is being seen 12/08/2021 for the evaluation of chest pain and elevated troponin at the request of Harvey.  History of Present Illness:   Craig Harvey reports that he developed sudden severe pressure across his chest yesterday evening at approximately 8:30 PM while watching television.  The pain persisted for over an hour, prompting him to call EMS.  He received sublingual nitroglycerin by the paramedics with prompt resolution of pain.  He notes some vague soreness in the center of his chest that has been present ever since his CABG.  On further questioning, he also endorses some mild pressure near the left upper chest/shoulder that is reminiscent of what he experienced leading up to his bypass in 2016.  At this left upper chest/shoulder discomfort seems to be most prevalent when he is exerting himself.  He has some associated exertional dyspnea at times.  He has not had any palpitations or edema.  Craig Harvey is compliant with his medications including aspirin, clopidogrel, metoprolol, and isosorbide mononitrate.  Craig Harvey complains of some intermittent lightheadedness.  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  He attributes it to having been consuming less water than usual.  He was seen at an urgent care and told that he was dehydrated.  He has not passed out completely.  At this time, Craig Harvey  presenting chest pressure has resolved.  He at times reports that he is asymptomatic though he also later indicates that he still has some vague discomfort in the chest.  High-sensitivity troponin I was minimally elevated but flat (31->30->32->31).  He was initially placed on a heparin infusion overnight by the admitting hospitalist, Dr. Emmit Pomfret.  Though she mentions consulting Dr. Mariah Harvey, our practice was not notified of Craig Harvey and consultation request until 1630 by Dr. Myriam Forehand.   Past Medical History:  Diagnosis Date   CAD (coronary artery disease)    a. 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 cardiac cath 01/09/2015: ostLAD 70:, mLAD 99%, ost to pLCx 95%, mLCx 80%, LVEF nl, recommend CABGl; c. s/p 3v CABG 01/14/15 LIMA-LAD, SVG-OM, SVG-Ramus   History of echocardiogram    a. 01/2015 Echo: EF 60-65%, no rwma, nl RV fxn. Nl PASP.   Hyperlipidemia LDL goal <70    Hypertension    Morbid obesity (HCC)    OSA (obstructive sleep apnea)    Type II diabetes mellitus (HCC)     Past Surgical History:  Procedure Laterality Date   CARDIAC CATHETERIZATION N/A 01/09/2015   Procedure: Left Heart Cath and Coronary Angiography;  Surgeon: Antonieta Iba, MD;  Location: [facility] INVASIVE CV LAB;  Service: Cardiovascular;  Laterality: N/A;   CORONARY ARTERY BYPASS GRAFT N/A 01/14/2015   Procedure: CORONARY ARTERY BYPASS GRAFTING (CABG), ON  PUMP, TIMES THREE, USING LEFT INTERNAL MAMMARY ARTERY, RIGHT GREATER SAPHENOUS VEIN HARVESTED ENDOSCOPICALLY;  Surgeon: Kerin Perna, MD;  Location: [facility] OR;  Service: Open Heart Surgery;  Laterality: N/A;   HERNIA REPAIR     TEE WITHOUT CARDIOVERSION N/A 01/14/2015   Procedure: TRANSESOPHAGEAL ECHOCARDIOGRAM (TEE);  Surgeon: Kerin Perna, MD;  Location: [facility] OR;  Service: Open  Heart Surgery;  Laterality: N/A;     Home Medications:  Prior to Admission medications   Medication Sig Start Date Sydnee Lamour Date Taking? Authorizing Provider  aspirin EC 81 MG tablet Take 1 tablet (81 mg total) by mouth daily. 11/18/17  Yes Antonieta Iba, MD  atorvastatin (LIPITOR) 80 MG tablet Take 1 tablet (80 mg total) by mouth daily. 08/31/21  Yes Antonieta Iba, MD  clopidogrel (PLAVIX) 75 MG tablet Take 1 tablet (75 mg total) by mouth daily. 07/23/21  Yes Karamalegos, Netta Neat, DO  ezetimibe (ZETIA) 10 MG tablet Take 1 tablet (10 mg total) by mouth daily. 07/23/21  Yes Karamalegos, Netta Neat, DO  hydrochlorothiazide (HYDRODIURIL) 25 MG tablet Take 1 tablet (25 mg total) by mouth daily. 07/23/21  Yes Karamalegos, Netta Neat, DO  insulin lispro protamine-lispro (HUMALOG MIX 75/25) (75-25) 100 UNIT/ML SUSP injection INJECT 17 UNITS SUBCUTANEOUSLY TWICE DAILY WITH A MEAL. MAX OF 50 UNITS DAILY. 07/23/21  Yes Karamalegos, Netta Neat, DO  isosorbide mononitrate (IMDUR) 30 MG 24 hr tablet Take 1 tablet (30 mg total) by mouth daily. 07/23/21  Yes Karamalegos, Netta Neat, DO  lisinopril (ZESTRIL) 10 MG tablet Take 1 tablet (10 mg total) by mouth daily. 07/23/21  Yes Karamalegos, Netta Neat, DO  metFORMIN (GLUCOPHAGE) 1000 MG tablet TAKE 1 TABLET BY MOUTH TWICE DAILY WITH A MEAL 07/23/21  Yes Karamalegos, Netta Neat, DO  metoprolol succinate (TOPROL-XL) 50 MG 24 hr tablet Take with or immediately following a meal. 07/23/21  Yes Karamalegos, Netta Neat, DO  Syringe, Disposable, 3 ML MISC Use to inject humalog mix 75/25 17u BID 01/25/19  Yes Karamalegos, Netta Neat, DO  baclofen (LIORESAL) 10 MG tablet Take 0.5-1 tablets (5-10 mg total) by mouth 2 (two) times daily as needed for muscle spasms (leg pain). 07/22/20   Smitty Cords, DO  BD VEO INSULIN SYRINGE U/F 31G X 15/64" 0.3 ML MISC USE AS DIRECTED TWICE DAILY 10/21/21   Althea Charon, Netta Neat, DO  glucose blood (ACCU-CHEK AVIVA PLUS)  test strip Use to check blood sugar up to 3 x daily 07/22/20   Smitty Cords, DO    Inpatient Medications: Scheduled Meds:  aspirin EC  81 mg Oral Daily   atorvastatin  80 mg Oral Daily   ezetimibe  10 mg Oral Daily   hydrochlorothiazide  25 mg Oral Daily   insulin aspart  0-20 Units Subcutaneous Q4H   [START ON 12/09/2021] isosorbide mononitrate  60 mg Oral Daily   lisinopril  10 mg Oral Daily   metoprolol succinate  50 mg Oral Daily   Continuous Infusions:  heparin 1,600 Units/hr (12/08/21 1517)   PRN Meds: acetaminophen, ALPRAZolam, baclofen, nitroGLYCERIN, ondansetron (ZOFRAN) IV  Allergies:   No Known Allergies  Social History:   Social History   Tobacco Use   Smoking status: Former    Types: Cigarettes    Quit date: 08/06/1975    Years since quitting: 46.3   Smokeless tobacco: Never  Vaping Use   Vaping Use: Never used  Substance Use Topics   Alcohol use: No   Drug use: No  Family History:   Family History  Problem Relation Age of Onset   CAD Mother    CAD Brother    Throat cancer Father    Diabetes Neg Hx    Cancer Neg Hx    Prostate cancer Neg Hx    Colon cancer Neg Hx      ROS:  Please see the history of present illness. All other ROS reviewed and negative.     Physical Exam/Data:   Vitals:   12/08/21 1500 12/08/21 1525 12/08/21 1530 12/08/21 1815  BP: 126/79 126/79 (!) 155/94 121/81  Pulse: 66 65 69 85  Resp: (!) 35  16 15  Temp:      TempSrc:      SpO2: 99%  100% 99%  Weight:      Height:        Intake/Output Summary (Last 24 hours) at 12/08/2021 1823 Last data filed at 12/08/2021 0731 Gross per 24 hour  Intake --  Output 675 ml  Net -675 ml      12/07/2021   10:17 PM 08/31/2021   10:03 AM 07/23/2021    7:58 AM  Last 3 Weights  Weight (lbs) 302 lb 313 lb 299 lb 12.8 oz  Weight (kg) 136.986 kg 141.976 kg 135.988 kg     Body mass index is 45.92 kg/m.  General:  Well nourished, well developed, in no acute distress. HEENT:  normal Neck: no JVD Vascular: No carotid bruits; Distal pulses 2+ bilaterally Cardiac: Distant heart sounds.  Regular rate and rhythm without murmurs, rubs, or gallops. Lungs:  clear to auscultation bilaterally, no wheezing, rhonchi or rales  Abd: Obese, soft, nontender.  Unable to assess HSM due to body habitus. Ext: no edema Musculoskeletal:  No deformities, BUE and BLE strength normal and equal Skin: warm and dry  Neuro:  CNs 2-12 intact, no focal abnormalities noted Psych:  Normal affect   EKG:  The EKG was personally reviewed and demonstrates: Normal sinus rhythm with nonspecific T wave abnormality. Telemetry:  Telemetry was personally reviewed and demonstrates: Normal sinus rhythm with PVCs and artifact.  Isolated sinus pauses of up to 2 seconds noted overnight.  Relevant CV Studies: LHC (01/09/2015): Right dominant coronary arterial system Severe two-vessel disease: Ostial LAD estimated at 70-80%, eccentric calcification, LAD is a large vessel Also with critical proximal LAD lesion estimated at 95-99%. Proximal circumflex with severe 95% lesion after the takeoff of OM1, long, calcified, circumflex is a large vessel Mid circumflex also with 80% lesion, focal, prior to the takeoff of OM 2 Right coronary artery with mild diffuse disease  MPI (01/08/2015): HIGH RISK Test due to Poor Exercise Tolerance, Large-partially reversible perfusion defect in the likely LAD distribution.  TTE (01/06/2015): - Left ventricle: The cavity size was normal. Systolic function was    normal. The estimated ejection fraction was in the range of 60%    to 65%. Wall motion was normal; there were no regional wall    motion abnormalities. Left ventricular diastolic function    parameters were normal.  - Aortic valve: Valve area (Vmax): 4.8 cm^2.  - Left atrium: The atrium was normal in size.  - Right ventricle: Systolic function was normal.  - Pulmonary arteries: Systolic pressure was within the normal     range.   Laboratory Data:  High Sensitivity Troponin:   Recent Labs  Lab 12/07/21 2328 12/08/21 0141 12/08/21 1243 12/08/21 1615  TROPONINIHS 31* 30* 32* 31*     Chemistry Recent Labs  Lab  12/07/21 2328 12/08/21 1243  NA 129* 132*  K 3.9 4.1  CL 98 100  CO2 22 24  GLUCOSE 100* 117*  BUN 16 11  CREATININE 0.82 0.76  CALCIUM 9.3 9.2  GFRNONAA >60 >60  ANIONGAP 9 8    Recent Labs  Lab 12/07/21 2328  PROT 7.5  ALBUMIN 4.1  AST 36  ALT 43  ALKPHOS 43  BILITOT 0.6   Lipids  Recent Labs  Lab 12/08/21 1243  CHOL 88  TRIG 63  HDL 34*  LDLCALC 41  CHOLHDL 2.6    Hematology Recent Labs  Lab 12/07/21 2328 12/08/21 0825 12/08/21 1243  WBC 5.2 4.5 4.2  RBC 4.57 4.47 4.69  HGB 12.5* 12.3* 12.8*  HCT 38.2* 37.5* 39.7  MCV 83.6 83.9 84.6  MCH 27.4 27.5 27.3  MCHC 32.7 32.8 32.2  RDW 14.0 14.1 14.2  PLT 176 147* 164   Thyroid  Recent Labs  Lab 12/08/21 1243  TSH 0.888    BNPNo results for input(s): BNP, PROBNP in the last 168 hours.  DDimer No results for input(s): DDIMER in the last 168 hours.   Radiology/Studies:  DG Chest 2 View  Result Date: 12/07/2021 CLINICAL DATA:  chest tightness EXAM: CHEST - 2 VIEW COMPARISON:  Chest x-ray 02/04/2015 FINDINGS: The heart and mediastinal contours are within normal limits. No focal consolidation. Slightly increased interstitial markings. No pleural effusion. No pneumothorax. No acute osseous abnormality.  Intact sternotomy wires. IMPRESSION: Slightly increased interstitial markings which may represent viral illness versus small airway disease. Electronically Signed   By: Tish Harvey M.D.   On: 12/07/2021 23:15     Assessment and Plan:   Unstable angina: Craig Harvey reports several different qualities of chest pain including chronic soreness following sternotomy, recent vague left upper chest/shoulder discomfort reminiscent of what he felt prior to his CABG in 2016, and episode of sudden pressure across his  chest yesterday evening that occurred at rest and resolved with sublingual nitroglycerin.  ED work-up thus far has been notable for minimal elevation of high-sensitivity troponin I that has been flat.  This finding is nonspecific and not consistent with NSTEMI.  EKG also does not show any acute ischemic changes.  However, Craig Harvey has known CAD with multiple risk factors.  We discussed further management options including medical therapy, myocardial perfusion stress testing, and cardiac catheterization.  Given his morbid obesity, I think myocardial perfusion stress testing would be of limited utility.  We have agreed to increase isosorbide mononitrate to 60 mg daily.  If he is asymptomatic after this adjustment, I think it would be reasonable for him to be discharged home and to follow-up within a week with Dr. Maurilio Lovely to discuss additional testing (myocardial PET/CT versus catheterization).  If he does not have complete resolution of his chest pain, we will need to move forward with catheterization this admission.  I think it is reasonable to continue heparin overnight, though I would have a low threshold for stopping it.  Sinus pause: Brief sinus pauses of up to 2 seconds noted overnight, most likely related to Craig Harvey's sleep apnea.  He had does not have his CPAP machine here.  I encouraged him to continue using CPAP at home.  Hypertension: Blood pressure normal to mildly elevated today.  We will increase isosorbide mononitrate, as above.  Continue the remainder of Craig Harvey antihypertensive regimen.  Hyperlipidemia: LDL well controlled on current regimen of atorvastatin and ezetimibe.  No medication changes at this time.  Morbid obesity: BMI greater than 45 with multiple comorbidities.  Encourage weight loss through diet and exercise.  For questions or updates, please contact [facility] Please consult [email or web address] for contact info under [facility] Cardiology  Signed, Yvonne Kendall, MD  12/08/2021 6:23 PM

## 2021-12-08 NOTE — Progress Notes (Signed)
Craig Harvey for Hepairn  Indication: chest pain/ACS  No Known Allergies  Patient Measurements: Height: 5\' 8"  (172.7 cm) Weight: (!) 137 kg (302 lb) IBW/kg (Calculated) : 68.4 Heparin Dosing Weight: 100.9 kg   Vital Signs: BP: 134/70 (06/06 1230) Pulse Rate: 68 (06/06 1230)  Labs: Recent Labs    12/07/21 2328 12/08/21 0141 12/08/21 0825 12/08/21 1243 12/08/21 1353  HGB 12.5*  --  12.3* 12.8*  --   HCT 38.2*  --  37.5* 39.7  --   PLT 176  --  147* 164  --   APTT  --  32  --   --   --   LABPROT  --  15.2  --   --   --   INR  --  1.2  --   --   --   HEPARINUNFRC  --   --  0.28*  --  0.59  CREATININE 0.82  --   --   --   --   TROPONINIHS 31* 30*  --   --   --      Estimated Creatinine Clearance: 124.9 mL/min (by C-G formula based on SCr of 0.82 mg/dL).   Medical History: Past Medical History:  Diagnosis Date   CAD (coronary artery disease)    a. 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 cardiac cath 01/09/2015: ostLAD 70:, mLAD 99%, ost to pLCx 95%, mLCx 80%, LVEF nl, recommend CABGl; c. s/p 3v CABG 01/14/15 LIMA-LAD, SVG-OM, SVG-Ramus   History of echocardiogram    a. 01/2015 Echo: EF 60-65%, no rwma, nl RV fxn. Nl PASP.   Hyperlipidemia LDL goal <70    Hypertension    Morbid obesity (HCC)    OSA (obstructive sleep apnea)    Type II diabetes mellitus ([place])     Assessment: Pharmacy consulted to dose heparin in this 66 year old male admitted with ACS/NSTEMI.  No prior anticoag noted.  12/08/21  Labs: Hgb 12.3, Platelets 147, aPTT 32, INR 1.2  Patient is obese BMI 45.92, heparin dose adjusted accordingly. Pt's hemoglobin and platelet count are low, continue to monitor to see how he tolerates the heparin infusion.   Goal of Therapy:   Heparin level 0.3-0.7 units/ml Monitor platelets by anticoagulation protocol: Yes   Plan:  Following recent rate adjustment heparin level is therapeutic  continue heparin infusion at 1600 units/hr recheck anti-Xa level in 6 hours and daily while on heparin Continue to monitor H&H and platelets  Craig Harvey 12/08/2021,2:28 PM

## 2021-12-08 NOTE — ED Notes (Signed)
Report received from William, RN 

## 2021-12-08 NOTE — ED Notes (Signed)
Echo at the bedside °

## 2021-12-08 NOTE — Progress Notes (Addendum)
ANTICOAGULATION CONSULT NOTE - Initial Consult  Pharmacy Consult for Hepairn  Indication: chest pain/ACS  No Known Allergies  Patient Measurements: Height: 5\' 8"  (172.7 cm) Weight: (!) 137 kg (302 lb) IBW/kg (Calculated) : 68.4 Heparin Dosing Weight: 100.9 kg   Vital Signs: Temp: 98.1 F (36.7 C) (06/05 2215) Temp Source: Oral (06/05 2215) BP: 156/85 (06/06 0530) Pulse Rate: 63 (06/06 0530)  Labs: Recent Labs    12/07/21 2328 12/08/21 0141 12/08/21 0825  HGB 12.5*  --  12.3*  HCT 38.2*  --  37.5*  PLT 176  --  147*  APTT  --  32  --   LABPROT  --  15.2  --   INR  --  1.2  --   HEPARINUNFRC  --   --  0.28*  CREATININE 0.82  --   --   TROPONINIHS 31* 30*  --      Estimated Creatinine Clearance: 124.9 mL/min (by C-G formula based on SCr of 0.82 mg/dL).   Medical History: Past Medical History:  Diagnosis Date   CAD (coronary artery disease)    a. 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 cardiac cath 01/09/2015: ostLAD 70:, mLAD 99%, ost to pLCx 95%, mLCx 80%, LVEF nl, recommend CABGl; c. s/p 3v CABG 01/14/15 LIMA-LAD, SVG-OM, SVG-Ramus   History of echocardiogram    a. 01/2015 Echo: EF 60-65%, no rwma, nl RV fxn. Nl PASP.   Hyperlipidemia LDL goal <70    Hypertension    Morbid obesity (HCC)    OSA (obstructive sleep apnea)    Type II diabetes mellitus (HCC)     Medications:  (Not in a hospital admission)  Assessment: Pharmacy consulted to dose heparin in this 66 year old male admitted with ACS/NSTEMI.  No prior anticoag noted. CrCl = 124.9 ml/min  12/08/21  HL @0825  = 0.28 SUBtherapeutic at 1400 units/hr   Goal of Therapy:  Heparin level 0.3-0.7 units/ml Monitor platelets by anticoagulation protocol: Yes   Plan:  6/6 HL @0825  = 0.28 SUBtherapeutic  Give 1500  units bolus x 1 Increase heparin infusion to 1600 units/hr recheck anti-Xa level in 6 hours following rate change and daily while on heparin Continue to monitor H&H and platelets  Kyndal Gloster Al-Daghir 12/08/2021,8:58 AM

## 2021-12-08 NOTE — Progress Notes (Signed)
[place] for Hepairn  Indication: chest pain/ACS  No Known Allergies  Patient Measurements: Height: 5\' 8"  (172.7 cm) Weight: (!) 137 kg (302 lb) IBW/kg (Calculated) : 68.4 Heparin Dosing Weight: 100.9 kg   Vital Signs: BP: 134/70 (06/06 1230) Pulse Rate: 68 (06/06 1230)  Labs: Recent Labs    12/07/21 2328 12/08/21 0141 12/08/21 0825 12/08/21 1243 12/08/21 1353  HGB 12.5*  --  12.3* 12.8*  --   HCT 38.2*  --  37.5* 39.7  --   PLT 176  --  147* 164  --   APTT  --  32  --   --   --   LABPROT  --  15.2  --   --   --   INR  --  1.2  --   --   --   HEPARINUNFRC  --   --  0.28*  --  0.59  CREATININE 0.82  --   --   --   --   TROPONINIHS 31* 30*  --   --   --      Estimated Creatinine Clearance: 124.9 mL/min (by C-G formula based on SCr of 0.82 mg/dL).   Medical History: Past Medical History:  Diagnosis Date   CAD (coronary artery disease)    a. 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 cardiac cath 01/09/2015: ostLAD 70:, mLAD 99%, ost to pLCx 95%, mLCx 80%, LVEF nl, recommend CABGl; c. s/p 3v CABG 01/14/15 LIMA-LAD, SVG-OM, SVG-Ramus   History of echocardiogram    a. 01/2015 Echo: EF 60-65%, no rwma, nl RV fxn. Nl PASP.   Hyperlipidemia LDL goal <70    Hypertension    Morbid obesity (HCC)    OSA (obstructive sleep apnea)    Type II diabetes mellitus ([place])     Assessment: Pharmacy consulted to dose heparin in this 66 year old male admitted with ACS/NSTEMI.  No prior anticoag noted.  12/08/21  Labs: Hgb 12.3, Platelets 147, aPTT 32, INR 1.2  Patient is obese BMI 45.92, heparin dose adjusted accordingly. Pt's hemoglobin and platelet count are low, continue to monitor to see how he tolerates the heparin infusion.   Goal of Therapy:   Heparin level 0.3-0.7 units/ml Monitor platelets by anticoagulation protocol: Yes   Plan:  Following recent rate adjustment heparin level remains therapeutic  continue heparin infusion at 1600 units/hr recheck anti-Xa level once daily while on heparin Continue to monitor H&H and platelets  Dallie Piles 12/08/2021,2:33 PM

## 2021-12-08 NOTE — H&P (Addendum)
[facility] [place] [facility] Admission History and Physical [employer] Cosimo Schertzer, D.O.  ---------------------------------------------------------------------------------------------------------------------   PATIENT NAME: Craig HuddleDavid [place]ac[place] MR#: [id number] DATE OF BIRTH: 10-09-1955 DATE OF ADMISSION: 12/07/2021 PRIMARY CARE PHYSICIAN: Smitty CordsKaramalegos, Alexander J, DO  REQUESTING/REFERRING PHYSICIAN: ED Dr. Katrinka BlazingSmith  CHIEF COMPLAINT: Chief Complaint  Patient presents with   Chest Pain    HISTORY OF PRESENT ILLNESS: Craig HuddleDavid Sandate is a 66 y.o. male with a known history of coronary artery disease status post CABG, hypertension, hyperlipidemia, diabetes, morbid obesity presents to the emergency department for evaluation of chest pain.  Patient was in a usual state of health until 8 PM this evening when he developed central chest tightness described as pressure while at rest.  Denies associated diaphoresis, shortness of breath, nausea, vomiting.  Otherwise there has been no change in status. Patient has been taking medication as prescribed and there has been no recent change in medication or diet.  There has been no recent illness, travel or sick contacts.    Patient denies fevers/chills, weakness, dizziness, shortness of breath, N/V/C/D, abdominal pain, dysuria/frequency, changes in mental status.   EMS/ED COURSE:   Patient received Maalox, Pepcid, nitroglycerin, normal saline and heparin medical admission was requested for chest pain rule out ACS.  PAST MEDICAL HISTORY: Past Medical History:  Diagnosis Date   CAD (coronary artery disease)    a. 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 cardiac cath 01/09/2015: ostLAD 70:, mLAD 99%, ost to pLCx 95%, mLCx 80%, LVEF nl, recommend CABGl; c. s/p 3v CABG  01/14/15 LIMA-LAD, SVG-OM, SVG-Ramus   History of echocardiogram    a. 01/2015 Echo: EF 60-65%, no rwma, nl RV fxn. Nl PASP.   Hyperlipidemia LDL goal <70    Hypertension    Morbid obesity (HCC)    OSA (obstructive sleep apnea)    Type II diabetes mellitus (HCC)       PAST SURGICAL HISTORY: Past Surgical History:  Procedure Laterality Date   CARDIAC CATHETERIZATION N/A 01/09/2015   Procedure: Left Heart Cath and Coronary Angiography;  Surgeon: Antonieta Ibaimothy J Gollan, MD;  Location: [facility] INVASIVE CV LAB;  Service: Cardiovascular;  Laterality: N/A;   CORONARY ARTERY BYPASS GRAFT N/A 01/14/2015   Procedure: CORONARY ARTERY BYPASS GRAFTING (CABG), ON PUMP, TIMES THREE, USING LEFT INTERNAL MAMMARY ARTERY, RIGHT GREATER SAPHENOUS VEIN HARVESTED ENDOSCOPICALLY;  Surgeon: Kerin PernaPeter Van Trigt, MD;  [facility];  Service: Open Heart Surgery;  Laterality: N/A;   HERNIA REPAIR     TEE WITHOUT CARDIOVERSION N/A 01/14/2015   Procedure: TRANSESOPHAGEAL ECHOCARDIOGRAM (TEE);  Surgeon: Kerin PernaPeter Van Trigt, MD;  [facility];  Service: Open Heart Surgery;  Laterality: N/A;      SOCIAL HISTORY: Social History   Tobacco Use   Smoking status: Former    Types: Cigarettes    Quit date: 08/06/1975    Years since quitting: 46.3   Smokeless tobacco: Never  Substance Use Topics   Alcohol use: No      FAMILY HISTORY: Family History  Problem Relation Age of Onset   CAD Mother    CAD Brother    Throat cancer Father    Diabetes Neg Hx    Cancer Neg Hx    Prostate cancer Neg Hx    Colon cancer Neg Hx      MEDICATIONS AT HOME: Prior to  Admission medications   Medication Sig Start Date End Date Taking? Authorizing Provider  aspirin EC 81 MG tablet Take 1 tablet (81 mg total) by mouth daily. 11/18/17   Antonieta Iba, MD  atorvastatin (LIPITOR) 80 MG tablet Take 1 tablet (80 mg total) by mouth daily. 08/31/21   Antonieta Iba, MD  baclofen (LIORESAL) 10 MG tablet Take 0.5-1 tablets (5-10 mg total) by mouth  2 (two) times daily as needed for muscle spasms (leg pain). 07/22/20   Karamalegos, Netta Neat, DO  BD VEO INSULIN SYRINGE U/F 31G X 15/64" 0.3 ML MISC USE AS DIRECTED TWICE DAILY 10/21/21   Althea Charon, Netta Neat, DO  clopidogrel (PLAVIX) 75 MG tablet Take 1 tablet (75 mg total) by mouth daily. 07/23/21   Karamalegos, Netta Neat, DO  ezetimibe (ZETIA) 10 MG tablet Take 1 tablet (10 mg total) by mouth daily. 07/23/21   Karamalegos, Netta Neat, DO  glucose blood (ACCU-CHEK AVIVA PLUS) test strip Use to check blood sugar up to 3 x daily 07/22/20   Althea Charon, Netta Neat, DO  hydrochlorothiazide (HYDRODIURIL) 25 MG tablet Take 1 tablet (25 mg total) by mouth daily. 07/23/21   Karamalegos, Netta Neat, DO  insulin lispro protamine-lispro (HUMALOG MIX 75/25) (75-25) 100 UNIT/ML SUSP injection INJECT 17 UNITS SUBCUTANEOUSLY TWICE DAILY WITH A MEAL. MAX OF 50 UNITS DAILY. 07/23/21   Karamalegos, Netta Neat, DO  isosorbide mononitrate (IMDUR) 30 MG 24 hr tablet Take 1 tablet (30 mg total) by mouth daily. 07/23/21   Karamalegos, Netta Neat, DO  lisinopril (ZESTRIL) 10 MG tablet Take 1 tablet (10 mg total) by mouth daily. 07/23/21   Karamalegos, Netta Neat, DO  metFORMIN (GLUCOPHAGE) 1000 MG tablet TAKE 1 TABLET BY MOUTH TWICE DAILY WITH A MEAL 07/23/21   Karamalegos, Netta Neat, DO  metoprolol succinate (TOPROL-XL) 50 MG 24 hr tablet Take with or immediately following a meal. 07/23/21   Karamalegos, Netta Neat, DO  Syringe, Disposable, 3 ML MISC Use to inject humalog mix 75/25 17u BID 01/25/19   Karamalegos, Netta Neat, DO      DRUG ALLERGIES: No Known Allergies   REVIEW OF SYSTEMS: CONSTITUTIONAL: No fatigue, weakness, fever, chills, weight gain/loss, headache EYES: No blurry or double vision. ENT: No tinnitus, postnasal drip, redness or soreness of the oropharynx. RESPIRATORY: No dyspnea, cough, wheeze, hemoptysis. CARDIOVASCULAR: Positive chest pain, negative orthopnea, palpitations,  syncope. GASTROINTESTINAL: No nausea, vomiting, constipation, diarrhea, abdominal pain. No hematemesis, melena or hematochezia. GENITOURINARY: No dysuria, frequency, hematuria. ENDOCRINE: No polyuria or nocturia. No heat or cold intolerance. HEMATOLOGY: No anemia, bruising, bleeding. INTEGUMENTARY: No rashes, ulcers, lesions. MUSCULOSKELETAL: No pain, arthritis, swelling, gout. NEUROLOGIC: No numbness, tingling, weakness or ataxia. No seizure-type activity. PSYCHIATRIC: No anxiety, depression, insomnia.  PHYSICAL EXAMINATION: VITAL SIGNS: Blood pressure (!) 141/83, pulse 66, temperature 98.1 F (36.7 C), temperature source Oral, resp. rate 13, height 5\' 8"  (1.727 m), weight (!) 137 kg, SpO2 100 %.  GENERAL: 66 y.o.-year-old male patient, well-developed, well-nourished lying in the bed in no acute distress.  Pleasant and cooperative.   HEENT: Head atraumatic, normocephalic. Pupils equal. No scleral Mucus membranes moist. NECK: Supple, full range of motion. No JVD,  CHEST: Normal breath sounds bilaterally. No wheezing, rales, rhonchi or crackles. No use of accessory muscles of respiration.  No reproducible chest wall tenderness.  CARDIOVASCULAR: S1, S2 normal. No murmurs, rubs, or gallops appreciated. Cap refill <2 seconds. ABDOMEN: Soft, nontender, nondistended. No rebound, guarding, rigidity EXTREMITIES: Full range of motion. No pedal edema, cyanosis, or  clubbing. NEUROLOGIC: Cranial nerves II through XII are grossly intact with no focal sensorimotor deficit. Muscle strength 5/5 in all extremities. Sensation intact. Gait not checked. PSYCHIATRIC: The patient is alert and oriented x 3. Normal affect, mood, thought content. SKIN: Warm, dry, and intact without obvious rash, lesion, or ulcer.  LABORATORY PANEL:  CBC Recent Labs  Lab 12/07/21 2328  WBC 5.2  HGB 12.5*  HCT 38.2*  PLT 176    ----------------------------------------------------------------------------------------------------------------- Chemistries Recent Labs  Lab 12/07/21 2328  NA 129*  K 3.9  CL 98  CO2 22  GLUCOSE 100*  BUN 16  CREATININE 0.82  CALCIUM 9.3  AST 36  ALT 43  ALKPHOS 43  BILITOT 0.6   ------------------------------------------------------------------------------------------------------------------ Cardiac Enzymes No results for input(s): TROPONINI in the last 168 hours. ------------------------------------------------------------------------------------------------------------------  RADIOLOGY: DG Chest 2 View  Result Date: 12/07/2021 CLINICAL DATA:  chest tightness EXAM: CHEST - 2 VIEW COMPARISON:  Chest x-ray 02/04/2015 FINDINGS: The heart and mediastinal contours are within normal limits. No focal consolidation. Slightly increased interstitial markings. No pleural effusion. No pneumothorax. No acute osseous abnormality.  Intact sternotomy wires. IMPRESSION: Slightly increased interstitial markings which may represent viral illness versus small airway disease. Electronically Signed   By: Tish Frederickson M.D.   On: 12/07/2021 23:15    EKG: Normal sinus rhythm with normal axis and nonspecific ST-T wave changes.  First-degree AV block which is unchanged from prior  IMPRESSION AND PLAN:  This is a 66 y.o. male with a history of coronary artery disease status post CABG, hypertension, hyperlipidemia, diabetes, morbid obesity now being admitted with:  1. Chest pain, NSTEMI history of coronary artery disease - Admit to IP with telemetry monitoring. - Trend troponins, check lipids and TSH. - Morphine, nitro, beta blocker, aspirin and statin ordered.   -Continue Lipitor, Plavix, Zetia, Imdur, lisinopril, metoprolol - Check echo -N.p.o. after midnight - Cardiology consult requested of Dr. Mariah Milling  2. H/o Diabetes - Accuchecks achs with RISS coverage -Hold metformin - Heart  healthy, carb controlled diet N.p.o. after midnight  3.  History of hyperlipidemia - Continue Zetia, Lipitor  Admission status: IP telemetry Diet/Nutrition: Heart healthy, NPO after midnight Fluids: HL DVT Px: Heparin,  SCDs and early ambulation Code Status: Full Disposition Plan: To home in <24 hours  All the records are reviewed and case discussed with ED provider. Management plans discussed with the patient and/or family who express understanding and agree with plan of care.   TOTAL TIME TAKING CARE OF THIS PATIENT: 60 minutes.   Ceara Wrightson D.O. on 12/08/2021 at 12:51 AM Between 7am to 6pm - Pager - [phone] After 6pm go to [email or web address] - Social research officer, government [employer] [facility] Office [phone] CC: Primary care physician; Smitty Cords, DO     Note: This dictation was prepared with Dragon dictation along with smaller phrase technology. Any transcriptional errors that result from this process are unintentional.

## 2021-12-08 NOTE — ED Provider Notes (Signed)
I assumed care of this patient approximately 2345.  Please see outgoing providers note for full details on patient's initial evaluation assessment.  In brief patient presents with history of diabetes, HTN, CAD status post CABG and obesity for evaluation of chest tightness that is acute on chronic in nature.  He states current episode started around 8 PM and was improved with nitroglycerin.  He received ASA with EMS.  He denies any shortness of breath, cough, fevers or any other associated sick symptoms other than some "stiffness "in his left arm and back.  Denies any pain in his left arm or back.  ECG is largely unchanged from prior showing first-degree AV block.  Initial troponin is 31 given patient's cardiac history and comorbidities I am concerned for possible unstable angina I will admit to medicine service for further evaluation and management.  We will start on heparin.   Gilles Chiquito, MD 12/08/21 0040

## 2021-12-09 ENCOUNTER — Inpatient Hospital Stay
Admission: EM | Admit: 2021-12-09 | Discharge: 2021-12-11 | DRG: 247 | Disposition: A | Discharge status: Home / Self Care | Payer: Medicaid Other | Attending: Internal Medicine | Admitting: Internal Medicine

## 2021-12-09 ENCOUNTER — Emergency Department: Payer: Medicaid Other

## 2021-12-09 ENCOUNTER — Encounter: Payer: Self-pay | Admitting: Emergency Medicine

## 2021-12-09 ENCOUNTER — Other Ambulatory Visit: Payer: Self-pay

## 2021-12-09 ENCOUNTER — Telehealth: Payer: Self-pay | Admitting: *Deleted

## 2021-12-09 DIAGNOSIS — I200 Unstable angina: Secondary | ICD-10-CM

## 2021-12-09 DIAGNOSIS — I10 Essential (primary) hypertension: Secondary | ICD-10-CM | POA: Diagnosis present

## 2021-12-09 DIAGNOSIS — I2510 Atherosclerotic heart disease of native coronary artery without angina pectoris: Secondary | ICD-10-CM

## 2021-12-09 DIAGNOSIS — Z7982 Long term (current) use of aspirin: Secondary | ICD-10-CM

## 2021-12-09 DIAGNOSIS — I25110 Atherosclerotic heart disease of native coronary artery with unstable angina pectoris: Principal | ICD-10-CM | POA: Diagnosis present

## 2021-12-09 DIAGNOSIS — R072 Precordial pain: Secondary | ICD-10-CM

## 2021-12-09 DIAGNOSIS — Z6841 Body Mass Index (BMI) 40.0 and over, adult: Secondary | ICD-10-CM

## 2021-12-09 DIAGNOSIS — E876 Hypokalemia: Secondary | ICD-10-CM | POA: Diagnosis present

## 2021-12-09 DIAGNOSIS — G4733 Obstructive sleep apnea (adult) (pediatric): Secondary | ICD-10-CM | POA: Diagnosis present

## 2021-12-09 DIAGNOSIS — R079 Chest pain, unspecified: Secondary | ICD-10-CM | POA: Diagnosis not present

## 2021-12-09 DIAGNOSIS — Z7984 Long term (current) use of oral hypoglycemic drugs: Secondary | ICD-10-CM

## 2021-12-09 DIAGNOSIS — Z794 Long term (current) use of insulin: Secondary | ICD-10-CM

## 2021-12-09 DIAGNOSIS — Z87891 Personal history of nicotine dependence: Secondary | ICD-10-CM

## 2021-12-09 DIAGNOSIS — Z79899 Other long term (current) drug therapy: Secondary | ICD-10-CM

## 2021-12-09 DIAGNOSIS — Z808 Family history of malignant neoplasm of other organs or systems: Secondary | ICD-10-CM

## 2021-12-09 DIAGNOSIS — Z951 Presence of aortocoronary bypass graft: Secondary | ICD-10-CM

## 2021-12-09 DIAGNOSIS — Z7902 Long term (current) use of antithrombotics/antiplatelets: Secondary | ICD-10-CM

## 2021-12-09 DIAGNOSIS — E1151 Type 2 diabetes mellitus with diabetic peripheral angiopathy without gangrene: Secondary | ICD-10-CM | POA: Diagnosis present

## 2021-12-09 DIAGNOSIS — E785 Hyperlipidemia, unspecified: Secondary | ICD-10-CM | POA: Diagnosis present

## 2021-12-09 DIAGNOSIS — Z8249 Family history of ischemic heart disease and other diseases of the circulatory system: Secondary | ICD-10-CM

## 2021-12-09 LAB — CBC
HCT: 36 % — ABNORMAL LOW (ref 39.0–52.0)
HCT: 40.7 % (ref 39.0–52.0)
Hemoglobin: 11.8 g/dL — ABNORMAL LOW (ref 13.0–17.0)
Hemoglobin: 13.2 g/dL (ref 13.0–17.0)
MCH: 27.6 pg (ref 26.0–34.0)
MCH: 27.2 pg (ref 26.0–34.0)
MCHC: 32.8 g/dL (ref 30.0–36.0)
MCHC: 32.4 g/dL (ref 30.0–36.0)
MCV: 84.1 fL (ref 80.0–100.0)
MCV: 83.9 fL (ref 80.0–100.0)
Platelets: 169 10*3/uL (ref 150–400)
Platelets: 202 10*3/uL (ref 150–400)
RBC: 4.28 MIL/uL (ref 4.22–5.81)
RBC: 4.85 MIL/uL (ref 4.22–5.81)
RDW: 14 % (ref 11.5–15.5)
RDW: 14.1 % (ref 11.5–15.5)
WBC: 4.6 10*3/uL (ref 4.0–10.5)
WBC: 4.4 10*3/uL (ref 4.0–10.5)
nRBC: 0 % (ref 0.0–0.2)
nRBC: 0 % (ref 0.0–0.2)

## 2021-12-09 LAB — ECHOCARDIOGRAM COMPLETE
Area-P 1/2: 3.6 cm2
Height: 68 in
S' Lateral: 2.92 cm
Weight: 4832 oz

## 2021-12-09 LAB — TROPONIN I (HIGH SENSITIVITY)
Troponin I (High Sensitivity): 28 ng/L — ABNORMAL HIGH (ref <18)
Troponin I (High Sensitivity): 28 ng/L — ABNORMAL HIGH (ref <18)

## 2021-12-09 LAB — GLUCOSE, CAPILLARY
Glucose-Capillary: 110 mg/dL — ABNORMAL HIGH (ref 70–99)
Glucose-Capillary: 106 mg/dL — ABNORMAL HIGH (ref 70–99)

## 2021-12-09 LAB — HEPARIN LEVEL (UNFRACTIONATED): Heparin Unfractionated: 0.77 IU/mL — ABNORMAL HIGH (ref 0.30–0.70)

## 2021-12-09 LAB — BASIC METABOLIC PANEL
Anion gap: 13 (ref 5–15)
BUN: 12 mg/dL (ref 8–23)
CO2: 19 mmol/L — ABNORMAL LOW (ref 22–32)
Calcium: 9.2 mg/dL (ref 8.9–10.3)
Chloride: 98 mmol/L (ref 98–111)
Creatinine, Ser: 0.9 mg/dL (ref 0.61–1.24)
GFR, Estimated: >60 mL/min (ref >60)
Glucose, Bld: 138 mg/dL — ABNORMAL HIGH (ref 70–99)
Potassium: 3.4 mmol/L — ABNORMAL LOW (ref 3.5–5.1)
Sodium: 130 mmol/L — ABNORMAL LOW (ref 135–145)

## 2021-12-09 MED ORDER — ATORVASTATIN CALCIUM 80 MG PO TABS
80.0000 mg | ORAL_TABLET | Freq: Every day | ORAL | Status: DC
  Administered 2021-12-10 – 2021-12-11 (×2): 80 mg via ORAL
  Filled 2021-12-09: qty 4
  Filled 2021-12-09: qty 1

## 2021-12-09 MED ORDER — ISOSORBIDE MONONITRATE ER 60 MG PO TB24: 60.0000 mg | ORAL_TABLET | Freq: Every day | ORAL | 0 refills | Status: DC

## 2021-12-09 MED ORDER — ASPIRIN 81 MG PO TBEC
81.0000 mg | DELAYED_RELEASE_TABLET | Freq: Every day | ORAL | Status: DC
  Administered 2021-12-10 – 2021-12-11 (×2): 81 mg via ORAL
  Filled 2021-12-09 (×2): qty 1

## 2021-12-09 MED ORDER — METOPROLOL SUCCINATE ER 50 MG PO TB24
50.0000 mg | ORAL_TABLET | Freq: Every day | ORAL | Status: DC
  Administered 2021-12-10 – 2021-12-11 (×2): 50 mg via ORAL
  Filled 2021-12-09 (×2): qty 1

## 2021-12-09 MED ORDER — HEPARIN (PORCINE) 25000 UT/250ML-% IV SOLN
1600.0000 [IU]/h | INTRAVENOUS | Status: DC
  Administered 2021-12-09 – 2021-12-10 (×2): 1600 [IU]/h via INTRAVENOUS
  Filled 2021-12-09 (×2): qty 250

## 2021-12-09 MED ORDER — CLOPIDOGREL BISULFATE 75 MG PO TABS
75.0000 mg | ORAL_TABLET | Freq: Every day | ORAL | Status: DC
  Administered 2021-12-10 – 2021-12-11 (×2): 75 mg via ORAL
  Filled 2021-12-09 (×2): qty 1

## 2021-12-09 MED ORDER — CLOPIDOGREL BISULFATE 75 MG PO TABS
75.0000 mg | ORAL_TABLET | Freq: Every day | ORAL | Status: DC
  Administered 2021-12-09: 75 mg via ORAL
  Filled 2021-12-09: qty 1

## 2021-12-09 MED ORDER — INSULIN ASPART 100 UNIT/ML IJ SOLN: 0.0000 [IU] | Freq: Every day | INTRAMUSCULAR | Status: DC

## 2021-12-09 MED ORDER — ISOSORBIDE MONONITRATE ER 60 MG PO TB24
60.0000 mg | ORAL_TABLET | Freq: Every day | ORAL | Status: DC
  Administered 2021-12-10 – 2021-12-11 (×2): 60 mg via ORAL
  Filled 2021-12-09 (×2): qty 1

## 2021-12-09 MED ORDER — HEPARIN BOLUS VIA INFUSION
4000.0000 [IU] | Freq: Once | INTRAVENOUS | Status: AC
  Administered 2021-12-09: 4000 [IU] via INTRAVENOUS
  Filled 2021-12-09: qty 4000

## 2021-12-09 MED ORDER — ONDANSETRON HCL 4 MG/2ML IJ SOLN: 4.0000 mg | Freq: Four times a day (QID) | INTRAMUSCULAR | Status: DC | PRN

## 2021-12-09 MED ORDER — NITROGLYCERIN 0.4 MG SL SUBL: 0.4000 mg | SUBLINGUAL_TABLET | SUBLINGUAL | Status: DC | PRN

## 2021-12-09 MED ORDER — INSULIN ASPART 100 UNIT/ML IJ SOLN
0.0000 [IU] | Freq: Three times a day (TID) | INTRAMUSCULAR | Status: DC
  Administered 2021-12-11: 4 [IU] via SUBCUTANEOUS
  Filled 2021-12-09: qty 1

## 2021-12-09 MED ORDER — ACETAMINOPHEN 325 MG PO TABS: 650.0000 mg | ORAL_TABLET | ORAL | Status: DC | PRN

## 2021-12-09 MED ORDER — EZETIMIBE 10 MG PO TABS
10.0000 mg | ORAL_TABLET | Freq: Every day | ORAL | Status: DC
  Administered 2021-12-10 – 2021-12-11 (×2): 10 mg via ORAL
  Filled 2021-12-09 (×2): qty 1

## 2021-12-09 MED ORDER — LISINOPRIL 10 MG PO TABS
10.0000 mg | ORAL_TABLET | Freq: Every day | ORAL | Status: DC
  Administered 2021-12-10 – 2021-12-11 (×2): 10 mg via ORAL
  Filled 2021-12-09 (×2): qty 1

## 2021-12-09 NOTE — ED Triage Notes (Signed)
C/O left sided chest pain.  Onset 1700.  Just discharged today from [facility] for chest pain.

## 2021-12-09 NOTE — Discharge Summary (Signed)
Physician Discharge Summary   Patient: Craig Harvey MRN: [id number] DOB: 1955/08/01  Admit date:     12/07/2021  Discharge date: 12/09/21  Discharge Physician: Jennye Boroughs   PCP: [name], DO   Recommendations at discharge:   Follow-up with PCP in 2 weeks  Follow-up with Dr. Rockey Situ, cardiologist, in 1 to 2 weeks  Discharge Diagnoses: Principal Problem:   Unstable angina ([place])  Resolved Problems:   * No resolved hospital problems. Craig Harvey Course: Craig Harvey is a 66 year old man with medical history significant for CAD s/p CABG, hypertension, hyperlipidemia, morbid obesity, type II DM, who presented to the hospital because of chest pain/chest tightness.  He was admitted to the hospital for unstable angina.  He was treated with IV heparin infusion.  He isosorbide mononitrate was increased from 30 to 60 mg daily.  His symptoms have resolved.  Cardiologist recommended outpatient follow-up.         Consultants: Cardiologist Procedures performed: None Disposition: Home Diet recommendation:  Discharge Diet Orders (From admission, onward)     Start     Ordered   12/09/21 0000  Diet - low sodium heart healthy        12/09/21 0929   12/09/21 0000  Diet Carb Modified        12/09/21 0929           Cardiac and Carb modified diet DISCHARGE MEDICATION: Allergies as of 12/09/2021   No Known Allergies      Medication List     TAKE these medications    Accu-Chek Aviva Plus test strip Generic drug: glucose blood Use to check blood sugar up to 3 x daily   aspirin EC 81 MG tablet Take 1 tablet (81 mg total) by mouth daily.   atorvastatin 80 MG tablet Commonly known as: LIPITOR Take 1 tablet (80 mg total) by mouth daily.   baclofen 10 MG tablet Commonly known as: LIORESAL Take 0.5-1 tablets (5-10 mg total) by mouth 2 (two) times daily as needed for muscle spasms (leg pain).   BD Veo Insulin Syringe U/F 31G X 15/64" 0.3 ML  Misc Generic drug: Insulin Syringe-Needle U-100 USE AS DIRECTED TWICE DAILY   clopidogrel 75 MG tablet Commonly known as: PLAVIX Take 1 tablet (75 mg total) by mouth daily.   ezetimibe 10 MG tablet Commonly known as: ZETIA Take 1 tablet (10 mg total) by mouth daily.   HumaLOG Mix 75/25 (75-25) 100 UNIT/ML Susp injection Generic drug: insulin lispro protamine-lispro INJECT 17 UNITS SUBCUTANEOUSLY TWICE DAILY WITH A MEAL. MAX OF 50 UNITS DAILY.   hydrochlorothiazide 25 MG tablet Commonly known as: HYDRODIURIL Take 1 tablet (25 mg total) by mouth daily.   isosorbide mononitrate 60 MG 24 hr tablet Commonly known as: IMDUR Take 1 tablet (60 mg total) by mouth daily. What changed:  medication strength how much to take   lisinopril 10 MG tablet Commonly known as: ZESTRIL Take 1 tablet (10 mg total) by mouth daily.   metFORMIN 1000 MG tablet Commonly known as: GLUCOPHAGE TAKE 1 TABLET BY MOUTH TWICE DAILY WITH A MEAL   metoprolol succinate 50 MG 24 hr tablet Commonly known as: TOPROL-XL Take with or immediately following a meal.   Syringe (Disposable) 3 ML Misc Use to inject humalog mix 75/25 17u BID        Discharge Exam: [facility] Weights   12/07/21 2217 12/09/21 0056  Weight: (!) 137 kg (!) 137.5 kg   GEN: NAD SKIN: Warm  and dry EYES: No pallor or icterus ENT: MMM CV: RRR PULM: CTA B ABD: soft, obese, NT, +BS CNS: AAO x 3, non focal EXT: No edema or tenderness   Condition at discharge: good  The results of significant diagnostics from this hospitalization (including imaging, microbiology, ancillary and laboratory) are listed below for reference.   Imaging Studies: DG Chest 2 View  Result Date: 12/07/2021 CLINICAL DATA:  chest tightness EXAM: CHEST - 2 VIEW COMPARISON:  Chest x-ray 02/04/2015 FINDINGS: The heart and mediastinal contours are within normal limits. No focal consolidation. Slightly increased interstitial markings. No pleural effusion. No  pneumothorax. No acute osseous abnormality.  Intact sternotomy wires. IMPRESSION: Slightly increased interstitial markings which may represent viral illness versus small airway disease. Electronically Signed   By: Iven Finn M.D.   On: 12/07/2021 23:15    Microbiology: Results for orders placed or performed during the hospital encounter of 01/09/15  MRSA PCR Screening     Status: None   Collection Time: 01/09/15  3:25 PM   Specimen: Nasopharyngeal  Result Value Ref Range Status   MRSA by PCR NEGATIVE NEGATIVE Final    Comment:        The GeneXpert MRSA Assay (FDA approved for NASAL specimens only), is one component of a comprehensive MRSA colonization surveillance program. It is not intended to diagnose MRSA infection nor to guide or monitor treatment for MRSA infections.   Surgical pcr screen     Status: None   Collection Time: 01/11/15  5:47 PM   Specimen: Nasal Mucosa; Nasal Swab  Result Value Ref Range Status   MRSA, PCR NEGATIVE NEGATIVE Final   Staphylococcus aureus NEGATIVE NEGATIVE Final    Comment:        The Xpert SA Assay (FDA approved for NASAL specimens in patients over 92 years of age), is one component of a comprehensive surveillance program.  Test performance has been validated by [facility] for patients greater than or equal to 43 year old. It is not intended to diagnose infection nor to guide or monitor treatment.     Labs: CBC: Recent Labs  Lab 12/07/21 2328 12/08/21 0825 12/08/21 1243 12/09/21 0627  WBC 5.2 4.5 4.2 4.6  NEUTROABS 3.5  --   --   --   HGB 12.5* 12.3* 12.8* 11.8*  HCT 38.2* 37.5* 39.7 36.0*  MCV 83.6 83.9 84.6 84.1  PLT 176 147* 164 [id number]   Basic Metabolic Panel: Recent Labs  Lab 12/07/21 2328 12/08/21 1243  NA 129* 132*  K 3.9 4.1  CL 98 100  CO2 22 24  GLUCOSE 100* 117*  BUN 16 11  CREATININE 0.82 0.76  CALCIUM 9.3 9.2   Liver Function Tests: Recent Labs  Lab 12/07/21 2328  AST 36  ALT 43  ALKPHOS 43   BILITOT 0.6  PROT 7.5  ALBUMIN 4.1   CBG: Recent Labs  Lab 12/08/21 2029 12/08/21 2243 12/08/21 2355 12/09/21 0357 12/09/21 0750  GLUCAP 166* 118* 172* 110* 106*    Discharge time spent: greater than 30 minutes.  Signed: Jennye Boroughs, MD [facility] 12/09/2021

## 2021-12-09 NOTE — Progress Notes (Signed)
PIV removed. Discharge instructions completed. Patient verbalized understanding of medication regimen, follow up appointments and discharge instructions. Patient belongings gathered and packed to discharge.  

## 2021-12-09 NOTE — Assessment & Plan Note (Signed)
Continue CPAP.  

## 2021-12-09 NOTE — H&P (Signed)
History and Physical    Patient: Craig Harvey UXL:[id number] DOB: [birth year] DOA: 12/09/2021 DOS: the patient was seen and examined on 12/09/2021 PCP: Smitty Cords, DO  Patient coming from: Home  Chief Complaint:  Chief Complaint  Patient presents with   Chest Pain    HPI: Craig Harvey is a 66 y.o. male with medical history significant for HTN, HLD, class III obesity, CAD s/p CABG x3, OSA on CPAP, discharged earlier today 6/7 with unstable angina with flat troponins ([id number]), treated with heparin infusion, discharged on increased dose of Imdur, who returns to the ED several hours later with recurring chest pain.  The pain is described as an aching on the left side similar to his recent chest pain.  He had mild associated shortness of breath but denied nausea, sweating, palpitations or lightheadedness.  Review of note from cardiologist, Dr. Okey Dupre on 6/6 shows recommendations to discharge if improvement with medication adjustment, which she did or move forward with cardiac cath if chest pain returns. ED course and data review: BP 148/100 with otherwise normal vitals.  Troponin 28.  Sodium 130, potassium 3.4, bicarb 19 otherwise unremarkable.  EKG, personally viewed and interpreted with NSR at 69 with no acute ST-T wave changes.  Chest x-ray nonacute. Patient started on a heparin infusion.  The ED provider spoke with cardiologist, Dr. Carmon Ginsberg who will see patient in the a.m.  Hospitalist consulted for admission.   Review of Systems: As mentioned in the history of present illness. All other systems reviewed and are negative.  Past Medical History:  Diagnosis Date   CAD (coronary artery disease)    a. 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 cardiac cath 01/09/2015: ostLAD 70:, mLAD 99%,  ost to pLCx 95%, mLCx 80%, LVEF nl, recommend CABGl; c. s/p 3v CABG 01/14/15 LIMA-LAD, SVG-OM, SVG-Ramus   History of echocardiogram    a. 01/2015 Echo: EF 60-65%, no rwma, nl RV fxn. Nl PASP.   Hyperlipidemia LDL goal <70    Hypertension    Morbid obesity (HCC)    OSA (obstructive sleep apnea)    Type II diabetes mellitus (HCC)    Past Surgical History:  Procedure Laterality Date   CARDIAC CATHETERIZATION N/A 01/09/2015   Procedure: Left Heart Cath and Coronary Angiography;  Surgeon: Antonieta Iba, MD;  Location: [facility] INVASIVE CV LAB;  Service: Cardiovascular;  Laterality: N/A;   CORONARY ARTERY BYPASS GRAFT N/A 01/14/2015   Procedure: CORONARY ARTERY BYPASS GRAFTING (CABG), ON PUMP, TIMES THREE, USING LEFT INTERNAL MAMMARY ARTERY, RIGHT GREATER SAPHENOUS VEIN HARVESTED ENDOSCOPICALLY;  Surgeon: Kerin Perna, MD;  Location: [facility] OR;  Service: Open Heart Surgery;  Laterality: N/A;   HERNIA REPAIR     TEE WITHOUT CARDIOVERSION N/A 01/14/2015   Procedure: TRANSESOPHAGEAL ECHOCARDIOGRAM (TEE);  Surgeon: Kerin Perna, MD;  Location: [facility] OR;  Service: Open Heart Surgery;  Laterality: N/A;   Social History:  reports that he quit smoking about 46 years ago. His smoking use included cigarettes. He has never used smokeless tobacco. He reports that he does not drink alcohol and does not use drugs.  No Known Allergies  Family History  Problem Relation Age of Onset   CAD Mother    CAD Brother    Throat cancer Father    Diabetes Neg Hx  Cancer Neg Hx    Prostate cancer Neg Hx    Colon cancer Neg Hx     Prior to Admission medications   Medication Sig Start Date End Date Taking? Authorizing Provider  aspirin EC 81 MG tablet Take 1 tablet (81 mg total) by mouth daily. 11/18/17  Yes Antonieta Iba, MD  atorvastatin (LIPITOR) 80 MG tablet Take 1 tablet (80 mg total) by mouth daily. 08/31/21  Yes Gollan, Tollie Pizza, MD  baclofen (LIORESAL) 10 MG tablet Take 0.5-1 tablets (5-10 mg total) by mouth  2 (two) times daily as needed for muscle spasms (leg pain). 07/22/20  Yes Karamalegos, Netta Neat, DO  BD VEO INSULIN SYRINGE U/F 31G X 15/64" 0.3 ML MISC USE AS DIRECTED TWICE DAILY 10/21/21  Yes Karamalegos, Netta Neat, DO  clopidogrel (PLAVIX) 75 MG tablet Take 1 tablet (75 mg total) by mouth daily. 07/23/21  Yes Karamalegos, Netta Neat, DO  ezetimibe (ZETIA) 10 MG tablet Take 1 tablet (10 mg total) by mouth daily. 07/23/21  Yes Karamalegos, Netta Neat, DO  glucose blood (ACCU-CHEK AVIVA PLUS) test strip Use to check blood sugar up to 3 x daily 07/22/20  Yes Karamalegos, Netta Neat, DO  hydrochlorothiazide (HYDRODIURIL) 25 MG tablet Take 1 tablet (25 mg total) by mouth daily. 07/23/21  Yes Karamalegos, Netta Neat, DO  insulin aspart (NOVOLOG) 100 UNIT/ML injection Inject 0-20 Units into the skin 3 (three) times daily before meals.   Yes [provider]  insulin lispro protamine-lispro (HUMALOG MIX 75/25) (75-25) 100 UNIT/ML SUSP injection INJECT 17 UNITS SUBCUTANEOUSLY TWICE DAILY WITH A MEAL. MAX OF 50 UNITS DAILY. 07/23/21  Yes Karamalegos, Netta Neat, DO  isosorbide mononitrate (IMDUR) 60 MG 24 hr tablet Take 1 tablet (60 mg total) by mouth daily. 12/09/21  Yes Lurene Shadow, MD  lisinopril (ZESTRIL) 10 MG tablet Take 1 tablet (10 mg total) by mouth daily. 07/23/21  Yes Karamalegos, Netta Neat, DO  metFORMIN (GLUCOPHAGE) 1000 MG tablet TAKE 1 TABLET BY MOUTH TWICE DAILY WITH A MEAL 07/23/21  Yes Karamalegos, Netta Neat, DO  metoprolol succinate (TOPROL-XL) 50 MG 24 hr tablet Take with or immediately following a meal. 07/23/21  Yes Karamalegos, Netta Neat, DO  Syringe, Disposable, 3 ML MISC Use to inject humalog mix 75/25 17u BID 01/25/19  Yes Smitty Cords, DO    Physical Exam: Vitals:   12/09/21 1854 12/09/21 1855 12/09/21 2110 12/09/21 2215  BP:  (!) 148/100 (!) 141/98 (!) 154/89  Pulse:  68 72 65  Resp:  Temp:  98.3 F (36.8 C)    TempSrc:  Oral    SpO2:   97% 96% 97%  Weight: (!) 137.5 kg     Height:  (1.727 m)      Physical Exam Vitals and nursing note reviewed.  Constitutional:      General: He is not in acute distress.    Appearance: He is obese.  HENT:     Head: Normocephalic and atraumatic.  Cardiovascular:     Rate and Rhythm: Normal rate and regular rhythm.     Heart sounds: Normal heart sounds.  Pulmonary:     Effort: Pulmonary effort is normal.     Breath sounds: Normal breath sounds.  Abdominal:     Palpations: Abdomen is soft.     Tenderness: There is no abdominal tenderness.  Neurological:     Mental Status: Mental status is at baseline.    Labs on Admission: I have personally  reviewed following labs and imaging studies  CBC: Recent Labs  Lab 12/07/21 2328 12/08/21 0825 12/08/21 1243 12/09/21 0627 12/09/21 1856  WBC 5.2 4.5 4.2 4.6 4.4  NEUTROABS 3.5  --   --   --   --   HGB 12.5* 12.3* 12.8* 11.8* 13.2  HCT 38.2* 37.5* 39.7 36.0* 40.7  MCV 83.6 83.9 84.6 84.1 83.9  PLT 176 147* 164 169 202   Basic Metabolic Panel: Recent Labs  Lab 12/07/21 2328 12/08/21 1243 12/09/21 1856  NA 129* 132* 130*  K 3.9 4.1 3.4*  CL 98 100 98  CO2 22 24 19*  GLUCOSE 100* 117* 138*  BUN CREATININE 0.82 0.76 0.90  CALCIUM 9.3 9.2 9.2   GFR: Estimated Creatinine Clearance: 114.1 mL/min (by C-G formula based on SCr of 0.9 mg/dL). Liver Function Tests: Recent Labs  Lab 12/07/21 2328  AST 36  ALT 43  ALKPHOS 43  BILITOT 0.6  PROT 7.5  ALBUMIN 4.1   No results for input(s): LIPASE, AMYLASE in the last 168 hours. No results for input(s): AMMONIA in the last 168 hours. Coagulation Profile: Recent Labs  Lab 12/08/21 0141  INR 1.2   Cardiac Enzymes: No results for input(s): CKTOTAL, CKMB, CKMBINDEX, TROPONINI in the last 168 hours. BNP (last 3 results) No results for input(s): PROBNP in the last 8760 hours. HbA1C: No results for input(s): HGBA1C in the last 72 hours. CBG: Recent Labs  Lab  12/08/21 2029 12/08/21 2243 12/08/21 2355 12/09/21 0357 12/09/21 0750  GLUCAP 166* 118* 172* 110* 106*   Lipid Profile: Recent Labs    12/08/21 1243  CHOL 88  HDL 34*  LDLCALC 41  TRIG 63  CHOLHDL 2.6   Thyroid Function Tests: Recent Labs    12/08/21 1243  TSH 0.888   Anemia Panel: No results for input(s): VITAMINB12, FOLATE, FERRITIN, TIBC, IRON, RETICCTPCT in the last 72 hours. Urine analysis:    Component Value Date/Time   COLORURINE YELLOW 01/11/2015 2150   APPEARANCEUR Clear 03/10/2016 1152   LABSPEC 1.011 01/11/2015 2150   PHURINE 6.0 01/11/2015 2150   GLUCOSEU Negative 03/10/2016 1152   HGBUR NEGATIVE 01/11/2015 2150   BILIRUBINUR Negative 03/10/2016 1152   KETONESUR NEGATIVE 01/11/2015 2150   PROTEINUR Negative 03/10/2016 1152   PROTEINUR NEGATIVE 01/11/2015 2150   UROBILINOGEN 1.0 01/11/2015 2150   NITRITE Negative 03/10/2016 1152   NITRITE NEGATIVE 01/11/2015 2150   LEUKOCYTESUR Negative 03/10/2016 1152    Radiological Exams on Admission: DG Chest 2 View  Result Date: 12/09/2021 CLINICAL DATA:  Chest pain. EXAM: CHEST - 2 VIEW COMPARISON:  12/07/2021 FINDINGS: The heart size and mediastinal contours are within normal limits. Prior CABG. There is no evidence of pulmonary edema, consolidation, pneumothorax, nodule or pleural fluid. The visualized skeletal structures are unremarkable. IMPRESSION: No active cardiopulmonary disease. Electronically Signed   By: Irish Lack M.D.   On: 12/09/2021 19:14   DG Chest 2 View  Result Date: 12/07/2021 CLINICAL DATA:  chest tightness EXAM: CHEST - 2 VIEW COMPARISON:  Chest x-ray 02/04/2015 FINDINGS: The heart and mediastinal contours are within normal limits. No focal consolidation. Slightly increased interstitial markings. No pleural effusion. No pneumothorax. No acute osseous abnormality.  Intact sternotomy wires. IMPRESSION: Slightly increased interstitial markings which may represent viral illness versus small  airway disease. Electronically Signed   By: Tish Frederickson M.D.   On: 12/07/2021 23:15   ECHOCARDIOGRAM COMPLETE  Result Date: 12/09/2021    ECHOCARDIOGRAM REPORT  Patient Name:   Craig Harvey Date of Exam: 12/08/2021 Medical Rec #:  [id number]       Height:       68.0 in Accession #:    [id number]      Weight:       302.0 lb Date of Birth:  [birth year]      BSA:          2.435 m Patient Age:    65 years        BP:           100/60 mmHg Patient Gender: M               HR:           85 bpm. Exam Location:  [facility] Procedure: 2D Echo, Cardiac Doppler and Color Doppler Indications:     I21.4 NSTEMI  History:         Patient has prior history of Echocardiogram examinations, most                  recent 01/14/2015. CAD; Risk Factors:Sleep Apnea, Diabetes,                  Hypertension and Dyslipidemia.  Sonographer:     Daphine DeutscheraTashia Rodgers-Jones RDCS Referring Phys:  [id number] Tonye RoyaltyALEXIS HUGELMEYER Diagnosing Phys: Debbe OdeaBrian Agbor-Etang MD IMPRESSIONS  1. Left ventricular ejection fraction, by estimation, is 60 to 65%. The left ventricle has normal function. The left ventricle has no regional wall motion abnormalities. There is mild left ventricular hypertrophy. Left ventricular diastolic parameters are consistent with Grade I diastolic dysfunction (impaired relaxation).  2. Right ventricular systolic function is normal. The right ventricular size is normal.  3. The mitral valve is grossly normal. No evidence of mitral valve regurgitation.  4. The aortic valve was not well visualized. Aortic valve regurgitation is not visualized.  5. Aortic dilatation noted. There is borderline dilatation of the aortic root, measuring 39 mm.  6. The inferior vena cava is normal in size with greater than 50% respiratory variability, suggesting right atrial pressure of 3 mmHg. FINDINGS  Left Ventricle: Left ventricular ejection fraction, by estimation, is 60 to 65%. The left ventricle has normal function. The left ventricle has no regional wall motion  abnormalities. The left ventricular internal cavity size was normal in size. There is  mild left ventricular hypertrophy. Left ventricular diastolic parameters are consistent with Grade I diastolic dysfunction (impaired relaxation). Right Ventricle: The right ventricular size is normal. No increase in right ventricular wall thickness. Right ventricular systolic function is normal. Left Atrium: Left atrial size was normal in size. Right Atrium: Right atrial size was normal in size. Pericardium: There is no evidence of pericardial effusion. Mitral Valve: The mitral valve is grossly normal. No evidence of mitral valve regurgitation. Tricuspid Valve: The tricuspid valve is grossly normal. Tricuspid valve regurgitation is not demonstrated. Aortic Valve: The aortic valve was not well visualized. Aortic valve regurgitation is not visualized. Pulmonic Valve: The pulmonic valve was not well visualized. Pulmonic valve regurgitation is not visualized. Aorta: Aortic dilatation noted. There is borderline dilatation of the aortic root, measuring 39 mm. Venous: The inferior vena cava is normal in size with greater than 50% respiratory variability, suggesting right atrial pressure of 3 mmHg. IAS/Shunts: No atrial level shunt detected by color flow Doppler.  LEFT VENTRICLE PLAX 2D LVIDd:         4.86 cm   Diastology LVIDs:  2.92 cm   LV e' medial:    6.42 cm/s LV PW:         1.08 cm   LV E/e' medial:  8.7 LV IVS:        1.12 cm   LV e' lateral:   11.10 cm/s LVOT diam:     2.50 cm   LV E/e' lateral: 5.0 LV SV:         61 LV SV Index:   25 LVOT Area:     4.91 cm  RIGHT VENTRICLE            IVC RV Basal diam:  3.94 cm    IVC diam: 1.53 cm RV S prime:     6.64 cm/s LEFT ATRIUM             Index        RIGHT ATRIUM           Index LA diam:        3.80 cm 1.56 cm/m   RA Area:     12.60 cm LA Vol (A2C):   46.2 ml 18.97 ml/m  RA Volume:   30.50 ml  12.53 ml/m LA Vol (A4C):   35.8 ml 14.70 ml/m LA Biplane Vol: 42.9 ml 17.62  ml/m  AORTIC VALVE LVOT Vmax:   90.80 cm/s LVOT Vmean:  67.400 cm/s LVOT VTI:    0.125 m  AORTA Ao Root diam: 3.90 cm Ao Asc diam:  3.60 cm MITRAL VALVE MV Area (PHT): 3.60 cm    SHUNTS MV Decel Time: 211 msec    Systemic VTI:  0.12 m MV E velocity: 55.70 cm/s  Systemic Diam: 2.50 cm MV A velocity: 73.30 cm/s MV E/A ratio:  0.76 Debbe Odea MD Electronically signed by Debbe Odea MD Signature Date/Time: 12/09/2021/5:38:29 PM    Final      Data Reviewed: Relevant notes from primary care and specialist visits, past discharge summaries as available in EHR, including Care Everywhere. Prior diagnostic testing as pertinent to current admission diagnoses Updated medications and problem lists for reconciliation ED course, including vitals, labs, imaging, treatment and response to treatment Triage notes, nursing and pharmacy notes and ED provider's notes Notable results as noted in HPI   Assessment and Plan: * Unstable angina (HCC) CAD s/p CABG x3 Continue heparin infusion started in the ED Continue aspirin, atorvastatin, isosorbide, metoprolol and lisinopril Keep n.p.o. for possible cardiac cath in the a.m. Cardiology consulted Prior cardiology notes and recommendations reviewed  Hypertension Continue lisinopril and metoprolol  Obesity, Class III, BMI 40-49.9 (morbid obesity) (HCC) Complicating factor to overall prognosis and care Counseled on lifestyle modification  OSA on CPAP Continue CPAP  DM (diabetes mellitus), type 2 with peripheral vascular complications (HCC) Insulin-dependent type 2 diabetes, controlled A1c 6.4 on 6/7 Sliding scale insulin and basal insulin         DVT prophylaxis: Heparin infusion  Consults: [facility] cardiology, Dr. Bufford Buttner  Advance Care Planning:   Code Status: Prior   Family Communication: none  Disposition Plan: Back to previous home environment  Severity of Illness: The appropriate patient status for this patient is OBSERVATION.  Observation status is judged to be reasonable and necessary in order to provide the required intensity of service to ensure the patient's safety. The patient's presenting symptoms, physical exam findings, and initial radiographic and laboratory data in the context of their medical condition is felt to place them at decreased risk for further clinical deterioration. Furthermore, it is anticipated that  the patient will be medically stable for discharge from the hospital within 2 midnights of admission.   Author: Andris Baumann, MD 12/09/2021 10:32 PM  For on call review [email or web address].

## 2021-12-09 NOTE — TOC Initial Note (Signed)
Transition of Care [facility]) - Initial/Assessment Note    Patient Details  Name: Craig Harvey MRN: [id number] Date of Birth: May 01, 1956  Transition of Care [facility]) CM/SW Contact:    Truddie Hidden, RN Phone Number: 12/09/2021, 10:21 AM  Clinical Narrative:                  Transition of Care [facility]) Screening Note   Patient Details  Name: Craig Harvey Date of Birth: 10-01-1955   Transition of Care [facility]) CM/SW Contact:    Truddie Hidden, RN Phone Number: 12/09/2021, 10:21 AM    Transition of Care Department [facility]) has reviewed patient and no TOC needs have been identified at this time. We will continue to monitor patient advancement through interdisciplinary progression rounds. If new patient transition needs arise, please place a TOC consult.          Patient Goals and CMS Choice        Expected Discharge Plan and Services           Expected Discharge Date: 12/09/21                                    Prior Living Arrangements/Services                       Activities of Daily Living   ADL Screening (condition at time of admission) Patient's cognitive ability adequate to safely complete daily activities?: (P) Yes Is the patient deaf or have difficulty hearing?: (P) No Does the patient have difficulty seeing, even when wearing glasses/contacts?: (P) No Does the patient have difficulty concentrating, remembering, or making decisions?: (P) No Patient able to express need for assistance with ADLs?: (P) Yes Does the patient have difficulty dressing or bathing?: (P) No Independently performs ADLs?: (P) Yes (appropriate for developmental age) Does the patient have difficulty walking or climbing stairs?: (P) No Weakness of Legs: (P) None Weakness of Arms/Hands: (P) None  Permission Sought/Granted                  Emotional Assessment              Admission diagnosis:  Morbid obesity (HCC) [E66.01] NSTEMI (non-ST elevated myocardial  infarction) (HCC) [I21.4] Nonspecific chest pain [R07.9] Type 2 diabetes mellitus without complication, with long-term current use of insulin (HCC) [E11.9, Z79.4] Patient Active Problem List   Diagnosis Date Noted   Unstable angina (HCC) 12/08/2021   Leg cramping 01/09/2020   CAD (coronary artery disease), native coronary artery 01/22/2018   Spondylosis of lumbar region without myelopathy or radiculopathy 10/26/2017   Chronic pain of left lower extremity 10/26/2017   Hypertension    Hyperlipidemia associated with type 2 diabetes mellitus (HCC)    S/P CABG x 3 01/14/2015   Atypical chest pain 01/09/2015   DM (diabetes mellitus), type 2 with peripheral vascular complications (HCC)    OSA on CPAP    Morbid obesity with BMI of 45.0-49.9, adult (HCC)    PCP:  Smitty Cords, DO Pharmacy:   [facility] [address] - [place], [place] - [address] [place] [address] Phone: [phone] Fax: [phone]  [facility] [address] ([address]), [place] - [address] [place]) [place] [address] Phone: [phone] Fax: [phone]     Social Determinants of Health (SDOH) Interventions  Readmission Risk Interventions     View : No data to display.

## 2021-12-09 NOTE — Telephone Encounter (Signed)
-----   Message from Cadence Kell Stall, PA-C sent at 12/09/2021  3:46 PM EDT ----- Regarding: hospital f/u Pt needs hospital follow-up 3-4 weeks.

## 2021-12-09 NOTE — Assessment & Plan Note (Addendum)
CAD s/p CABG x3 Continue heparin infusion started in the ED Continue aspirin, atorvastatin, isosorbide, metoprolol and lisinopril Keep n.p.o. for possible cardiac cath in the a.m. Cardiology consulted Prior cardiology notes and recommendations reviewed

## 2021-12-09 NOTE — Assessment & Plan Note (Signed)
Complicating factor to overall prognosis and care Counseled on lifestyle modification

## 2021-12-09 NOTE — ED Provider Notes (Signed)
[facility] Provider Note    Event Date/Time   First MD Initiated Contact with Patient 12/09/21 2127     (approximate)   History   Chest Pain   HPI  Craig Harvey is a 66 y.o. male  here with chest pain. Pt has a h/o CAD s/p CABG, HTN, HLD, DM, and was just discharged today for chest pain. Pt had been admitted and was considering cath but CP resolved with imdur, so was sent home. He states shortly after, pain returned, as an aching, throbbing, left sided CP. Feels similar to the Cp he has been having. He did have a "chill" as well, denies sweating. Mild SOB with the pain. It has now resolved in ED. Denies any leg swelling.       Physical Exam   Triage Vital Signs: ED Triage Vitals  Enc Vitals Group     BP 12/09/21 1855 (!) 148/100     Pulse Rate 12/09/21 1855 68     Resp 12/09/21 1855 16     Temp 12/09/21 1855 98.3 F (36.8 C)     Temp Source 12/09/21 1855 Oral     SpO2 12/09/21 1855 97 %     Weight 12/09/21 1854 (!) 303 lb 2.1 oz (137.5 kg)     Height 12/09/21 1854 5\' 8"  (1.727 m)     Head Circumference --      Peak Flow --      Pain Score 12/09/21 1854 8     Pain Loc --      Pain Edu? --      Excl. in GC? --     Most recent vital signs: Vitals:   12/09/21 1855 12/09/21 2110  BP: (!) 148/100 (!) 141/98  Pulse: 68 72  Resp: 16 18  Temp: 98.3 F (36.8 C)   SpO2: 97% 96%     General: Awake, no distress.  CV:  Good peripheral perfusion. RRR, no murmurs. Resp:  Normal effort. Lungs CTAB. Abd:  No distention. No tenderness. Other:  No swelling.   ED Results / Procedures / Treatments   Labs (all labs ordered are listed, but only abnormal results are displayed) Labs Reviewed  BASIC METABOLIC PANEL - Abnormal; Notable for the following components:      Result Value   Sodium 130 (*)    Potassium 3.4 (*)    CO2 19 (*)    Glucose, Bld 138 (*)    All other components within normal limits  TROPONIN I (HIGH SENSITIVITY) -  Abnormal; Notable for the following components:   Troponin I (High Sensitivity) 28 (*)    All other components within normal limits  TROPONIN I (HIGH SENSITIVITY) - Abnormal; Notable for the following components:   Troponin I (High Sensitivity) 28 (*)    All other components within normal limits  CBC  CBC  HEPARIN LEVEL (UNFRACTIONATED)     EKG Normal sinus rhythm, VR 69. PR 210, QRS 94, QTc 420. No acute St elevations or depressions. No ischemia or infarct.   RADIOLOGY CXR: Clear, no focal findings   I also independently reviewed and agree with radiologist interpretations.   PROCEDURES:  Critical Care performed: Yes, see critical care procedure note(s)  .1-3 Lead EKG Interpretation Performed by: 02/08/22, MD Authorized by: Shaune Pollack, MD     Interpretation: normal     ECG rate:  70-90   ECG rate assessment: normal     Rhythm: sinus rhythm  Ectopy: none     Conduction: normal   Comments:     Indication: Chest pain    MEDICATIONS ORDERED IN ED: Medications  heparin bolus via infusion 4,000 Units (has no administration in time range)  heparin ADULT infusion 100 units/mL (25000 units/259mL) (has no administration in time range)     IMPRESSION / MDM / ASSESSMENT AND PLAN / ED COURSE  I reviewed the triage vital signs and the nursing notes.                               The patient is on the cardiac monitor to evaluate for evidence of arrhythmia and/or significant heart rate changes.   Ddx:  Differential includes the following, with pertinent life- or limb-threatening emergencies considered:  Unstable angina, NSTEMI, ACS, PE, CHF, GERD, MSK chest pain, gastritis/GERD  Patient's presentation is most consistent with acute presentation with potential threat to life or bodily function.  MDM:  66 yo M with h/o CAD s/p CABG, HTN, HLD, DM, here with acute CP. Records from recent hospitalization reviewed in detail, including Consult note from Dr.  Okey Dupre. Here, EKG nonischemic. Trops elevated but stable. BMP largely unremarkable, mild hypoK noted. CBC without leukocytosis. CXR clear. Discussed with Dr. Tawanna Sat. Given recurrence of CP with plan to cath if pain recurred per recent Cards consultation, will place back on heparin and admit. Pt updated and in agreement.   MEDICATIONS GIVEN IN ED: Medications  heparin bolus via infusion 4,000 Units (has no administration in time range)  heparin ADULT infusion 100 units/mL (25000 units/218mL) (has no administration in time range)     Consults:  Dr. Tawanna Sat with cardiology Hospitalist consult   EMR reviewed  Extensive review of recent DC summary, Cardiology consult notes     FINAL CLINICAL IMPRESSION(S) / ED DIAGNOSES   Final diagnoses:  Unstable angina (HCC)     Rx / DC Orders   ED Discharge Orders     None        Note:  This document was prepared using Dragon voice recognition software and may include unintentional dictation errors.   Shaune Pollack, MD 12/09/21 2203

## 2021-12-09 NOTE — Assessment & Plan Note (Signed)
Continue lisinopril and metoprolol ?

## 2021-12-09 NOTE — Assessment & Plan Note (Addendum)
Insulin-dependent type 2 diabetes, controlled A1c 6.4 on 6/7 Sliding scale insulin and basal insulin

## 2021-12-09 NOTE — Progress Notes (Signed)
Craig Harvey for Hepairn  Indication: chest pain/ACS  No Known Allergies  Patient Measurements: Height: 5\' 8"  (172.7 cm) Weight: (!) 137.5 kg (303 lb 3.2 oz) IBW/kg (Calculated) : 68.4 Heparin Dosing Weight: 100.9 kg   Vital Signs: Temp: 97.9 F (36.6 C) (06/07 0405) Temp Source: Oral (06/07 0405) BP: 103/63 (06/07 0405) Pulse Rate: 65 (06/07 0405)  Labs: Recent Labs    12/07/21 2328 12/07/21 2328 12/08/21 0141 12/08/21 0825 12/08/21 1243 12/08/21 1353 12/08/21 1615 12/08/21 2025 12/09/21 0627  HGB 12.5*  --   --  12.3* 12.8*  --   --   --  11.8*  HCT 38.2*  --   --  37.5* 39.7  --   --   --  36.0*  PLT 176  --   --  147* 164  --   --   --  169  APTT  --   --  32  --   --   --   --   --   --   LABPROT  --   --  15.2  --   --   --   --   --   --   INR  --   --  1.2  --   --   --   --   --   --   HEPARINUNFRC  --    < >  --  0.28*  --  0.59  --  0.43 0.77*  CREATININE 0.82  --   --   --  0.76  --   --   --   --   TROPONINIHS 31*  --  30*  --  32*  --  31*  --   --    < > = values in this interval not displayed.     Estimated Creatinine Clearance: 128.3 mL/min (by C-G formula based on SCr of 0.76 mg/dL).   Medical History: Past Medical History:  Diagnosis Date   CAD (coronary artery disease)    a. 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 cardiac cath 01/09/2015: ostLAD 70:, mLAD 99%, ost to pLCx 95%, mLCx 80%, LVEF nl, recommend CABGl; c. s/p 3v CABG 01/14/15 LIMA-LAD, SVG-OM, SVG-Ramus   History of echocardiogram    a. 01/2015 Echo: EF 60-65%, no rwma, nl RV fxn. Nl PASP.   Hyperlipidemia LDL goal <70    Hypertension    Morbid obesity (HCC)    OSA (obstructive sleep apnea)    Type II diabetes mellitus ([place])     Assessment: Pharmacy  consulted to dose heparin in this 66 year old male admitted with ACS/NSTEMI.  No prior anticoag noted.  12/08/21  Labs: Hgb 12.3, Platelets 147, aPTT 32, INR 1.2  Patient is obese BMI 45.92, heparin dose adjusted accordingly. Pt's hemoglobin and platelet count are low, continue to monitor to see how he tolerates the heparin infusion.   Goal of Therapy:  Heparin level 0.3-0.7 units/ml Monitor platelets by anticoagulation protocol: Yes   Plan:  6/7:  HL @ 0627 = 0.77, elevated Will decrease heparin infusion to 1500 units/hr and recheck HL 6 hrs after rate change.   Jourdin Connors D 12/09/2021,7:18 AM

## 2021-12-09 NOTE — Progress Notes (Signed)
ANTICOAGULATION CONSULT NOTE  Pharmacy Consult for heparin infusion initiation and monitoring Indication: chest pain/ACS  No Known Allergies  Patient Measurements: Height: 5\' 8"  (172.7 cm) Weight: (!) 137.5 kg (303 lb 2.1 oz) IBW/kg (Calculated) : 68.4 Heparin Dosing Weight: 101.1 kg  Vital Signs: Temp: 98.3 F (36.8 C) (06/07 1855) Temp Source: Oral (06/07 1855) BP: 141/98 (06/07 2110) Pulse Rate: 72 (06/07 2110)  Labs: Recent Labs    12/07/21 2328 12/08/21 0141 12/08/21 0825 12/08/21 1243 12/08/21 1353 12/08/21 1615 12/08/21 2025 12/09/21 0627 12/09/21 1856 12/09/21 2110  HGB 12.5*  --    < > 12.8*  --   --   --  11.8* 13.2  --   HCT 38.2*  --    < > 39.7  --   --   --  36.0* 40.7  --   PLT 176  --    < > 164  --   --   --  169 202  --   APTT  --  32  --   --   --   --   --   --   --   --   LABPROT  --  15.2  --   --   --   --   --   --   --   --   INR  --  1.2  --   --   --   --   --   --   --   --   HEPARINUNFRC  --   --    < >  --  0.59  --  0.43 0.77*  --   --   CREATININE 0.82  --   --  0.76  --   --   --   --  0.90  --   TROPONINIHS 31* 30*  --  32*  --  31*  --   --  28* 28*   < > = values in this interval not displayed.    Estimated Creatinine Clearance: 114.1 mL/min (by C-G formula based on SCr of 0.9 mg/dL).   Medical History: Past Medical History:  Diagnosis Date   CAD (coronary artery disease)    a. 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 cardiac cath 01/09/2015: ostLAD 70:, mLAD 99%, ost to pLCx 95%, mLCx 80%, LVEF nl, recommend CABGl; c. s/p 3v CABG 01/14/15 LIMA-LAD, SVG-OM, SVG-Ramus   History of echocardiogram    a. 01/2015 Echo: EF 60-65%, no rwma, nl RV fxn. Nl PASP.   Hyperlipidemia LDL goal <70    Hypertension    Morbid obesity (HCC)    OSA  (obstructive sleep apnea)    Type II diabetes mellitus [facility])     Assessment: 66 year old man with medical history significant for CAD s/p CABG, hypertension, hyperlipidemia, morbid obesity, type II DM, who presented to the hospital because of chest pain/chest tightness. A  review of dispense records reveals no DOAC or vitamin K antagonist.  Goal of Therapy:  Heparin level 0.3-0.7 units/ml Monitor platelets by anticoagulation protocol: Yes   Plan:  Give 4000 units bolus x 1 Start heparin infusion at 1600 units/hr Check anti-Xa level in 6 hours and daily while on heparin Continue to monitor H&H and platelets  66 12/09/2021,9:55 PM

## 2021-12-09 NOTE — Progress Notes (Signed)
Progress Note  Patient Name: Craig Harvey Date of Encounter: 12/09/2021  [facility] Cardiologist: Julien Nordmann, MD   Subjective   Imdur increased to 60 mg daily yesterday, denies chest pain or shortness of breath.  No Acute events overnight, planning on going home.  Inpatient Medications    Scheduled Meds:  aspirin EC  81 mg Oral Daily   atorvastatin  80 mg Oral Daily   clopidogrel  75 mg Oral Daily   ezetimibe  10 mg Oral Daily   hydrochlorothiazide  25 mg Oral Daily   insulin aspart  0-20 Units Subcutaneous Q4H   isosorbide mononitrate  60 mg Oral Daily   lisinopril  10 mg Oral Daily   metoprolol succinate  50 mg Oral Daily   Continuous Infusions:  PRN Meds: acetaminophen, ALPRAZolam, baclofen, nitroGLYCERIN, ondansetron (ZOFRAN) IV   Vital Signs    Vitals:   12/08/21 2351 12/09/21 0056 12/09/21 0405 12/09/21 0753  BP: 111/75  103/63 110/74  Pulse: 72  65 61  Resp: 18  18 16   Temp: 98.1 F (36.7 C)  97.9 F (36.6 C) 98 F (36.7 C)  TempSrc: Oral  Oral   SpO2: 98%   97%  Weight:  (!) 137.5 kg    Height:  5\' 8"  (1.727 m)      Intake/Output Summary (Last 24 hours) at 12/09/2021 1345 Last data filed at 12/09/2021 0900 Gross per 24 hour  Intake 878.28 ml  Output 700 ml  Net 178.28 ml      12/09/2021   12:56 AM 12/07/2021   10:17 PM 08/31/2021   10:03 AM  Last 3 Weights  Weight (lbs) 303 lb 3.2 oz 302 lb 313 lb  Weight (kg) 137.531 kg 136.986 kg 141.976 kg      Telemetry    Sinus rhythm, 68- Personally Reviewed  ECG     - Personally Reviewed  Physical Exam   GEN: No acute distress.   Neck: No JVD Cardiac: RRR, no murmurs, rubs, or gallops.  Respiratory: Clear to auscultation bilaterally. GI: Soft, nontender, non-distended  MS: No edema; No deformity. Neuro:  Nonfocal  Psych: Normal affect   Labs    High Sensitivity Troponin:   Recent Labs  Lab 12/07/21 2328 12/08/21 0141 12/08/21 1243 12/08/21 1615  TROPONINIHS 31* 30* 32* 31*      Chemistry Recent Labs  Lab 12/07/21 2328 12/08/21 1243  NA 129* 132*  K 3.9 4.1  CL 98 100  CO2 22 24  GLUCOSE 100* 117*  BUN 16 11  CREATININE 0.82 0.76  CALCIUM 9.3 9.2  PROT 7.5  --   ALBUMIN 4.1  --   AST 36  --   ALT 43  --   ALKPHOS 43  --   BILITOT 0.6  --   GFRNONAA >60 >60  ANIONGAP 9 8    Lipids  Recent Labs  Lab 12/08/21 1243  CHOL 88  TRIG 63  HDL 34*  LDLCALC 41  CHOLHDL 2.6    Hematology Recent Labs  Lab 12/08/21 0825 12/08/21 1243 12/09/21 0627  WBC 4.5 4.2 4.6  RBC 4.47 4.69 4.28  HGB 12.3* 12.8* 11.8*  HCT 37.5* 39.7 36.0*  MCV 83.9 84.6 84.1  MCH 27.5 27.3 27.6  MCHC 32.8 32.2 32.8  RDW 14.1 14.2 14.0  PLT 147* 164 169   Thyroid  Recent Labs  Lab 12/08/21 1243  TSH 0.888    BNPNo results for input(s): BNP, PROBNP in the last 168 hours.  DDimer No results for input(s): DDIMER in the last 168 hours.   Radiology    DG Chest 2 View  Result Date: 12/07/2021 CLINICAL DATA:  chest tightness EXAM: CHEST - 2 VIEW COMPARISON:  Chest x-ray 02/04/2015 FINDINGS: The heart and mediastinal contours are within normal limits. No focal consolidation. Slightly increased interstitial markings. No pleural effusion. No pneumothorax. No acute osseous abnormality.  Intact sternotomy wires. IMPRESSION: Slightly increased interstitial markings which may represent viral illness versus small airway disease. Electronically Signed   By: Tish Frederickson M.D.   On: 12/07/2021 23:15    Cardiac Studies   Echocardiogram performed yesterday EF 60%  Patient Profile     66 y.o. male with history of CAD/CABG x3, hypertension, hyperlipidemia, diabetes, OSA, obesity presenting with chest pain found to have minimal troponin elevation.  Assessment & Plan    Chest pain, history of CAD/CABG -Chest pain resolved with increasing Imdur dose. -Continue Imdur 60 mg daily, -Continue aspirin, Lipitor, Zetia, Toprol-XL.  2.  Hypertension -BP controlled -Continue  Toprol-XL, Imdur.  Lisinopril 10.  Close follow-up as outpatient advised.  Continue cardiac medications as above.  Total encounter time more than 50 minutes  Greater than 50% was spent in counseling and coordination of care with the patient       Signed, Debbe Odea, MD  12/09/2021, 1:45 PM

## 2021-12-10 ENCOUNTER — Encounter
Admission: EM | Disposition: A | Discharge status: Home / Self Care | Payer: Self-pay | Source: Home / Self Care | Attending: Internal Medicine

## 2021-12-10 ENCOUNTER — Other Ambulatory Visit: Payer: Self-pay

## 2021-12-10 ENCOUNTER — Telehealth: Payer: Self-pay

## 2021-12-10 DIAGNOSIS — E1151 Type 2 diabetes mellitus with diabetic peripheral angiopathy without gangrene: Secondary | ICD-10-CM

## 2021-12-10 DIAGNOSIS — Z951 Presence of aortocoronary bypass graft: Secondary | ICD-10-CM

## 2021-12-10 DIAGNOSIS — I10 Essential (primary) hypertension: Secondary | ICD-10-CM

## 2021-12-10 DIAGNOSIS — Z9989 Dependence on other enabling machines and devices: Secondary | ICD-10-CM | POA: Diagnosis not present

## 2021-12-10 DIAGNOSIS — G4733 Obstructive sleep apnea (adult) (pediatric): Secondary | ICD-10-CM | POA: Diagnosis not present

## 2021-12-10 DIAGNOSIS — I25110 Atherosclerotic heart disease of native coronary artery with unstable angina pectoris: Secondary | ICD-10-CM

## 2021-12-10 DIAGNOSIS — I200 Unstable angina: Secondary | ICD-10-CM

## 2021-12-10 HISTORY — PX: CORONARY STENT INTERVENTION: CATH118234

## 2021-12-10 HISTORY — PX: LEFT HEART CATH AND CORONARY ANGIOGRAPHY: CATH118249

## 2021-12-10 LAB — CBC
HCT: 37.5 % — ABNORMAL LOW (ref 39.0–52.0)
Hemoglobin: 12.3 g/dL — ABNORMAL LOW (ref 13.0–17.0)
MCH: 27.3 pg (ref 26.0–34.0)
MCHC: 32.8 g/dL (ref 30.0–36.0)
MCV: 83.3 fL (ref 80.0–100.0)
Platelets: 169 10*3/uL (ref 150–400)
RBC: 4.5 MIL/uL (ref 4.22–5.81)
RDW: 14.1 % (ref 11.5–15.5)
WBC: 4.9 10*3/uL (ref 4.0–10.5)
nRBC: 0 % (ref 0.0–0.2)

## 2021-12-10 LAB — CREATININE, SERUM
Creatinine, Ser: 0.76 mg/dL (ref 0.61–1.24)
GFR, Estimated: >60 mL/min (ref >60)

## 2021-12-10 LAB — POCT ACTIVATED CLOTTING TIME
Activated Clotting Time: 233 seconds
Activated Clotting Time: 305 seconds

## 2021-12-10 LAB — GLUCOSE, CAPILLARY
Glucose-Capillary: 164 mg/dL — ABNORMAL HIGH (ref 70–99)
Glucose-Capillary: 133 mg/dL — ABNORMAL HIGH (ref 70–99)

## 2021-12-10 LAB — HEPARIN LEVEL (UNFRACTIONATED)
Heparin Unfractionated: 0.58 IU/mL (ref 0.30–0.70)
Heparin Unfractionated: 0.65 IU/mL (ref 0.30–0.70)

## 2021-12-10 LAB — CBG MONITORING, ED
Glucose-Capillary: 135 mg/dL — ABNORMAL HIGH (ref 70–99)
Glucose-Capillary: 150 mg/dL — ABNORMAL HIGH (ref 70–99)

## 2021-12-10 SURGERY — LEFT HEART CATH AND CORONARY ANGIOGRAPHY
Anesthesia: Moderate Sedation

## 2021-12-10 MED ORDER — SODIUM CHLORIDE 0.9% FLUSH: 3.0000 mL | INTRAVENOUS | Status: DC | PRN

## 2021-12-10 MED ORDER — MIDAZOLAM HCL 2 MG/2ML IJ SOLN
INTRAMUSCULAR | Status: DC | PRN
  Administered 2021-12-10: 1 mg via INTRAVENOUS

## 2021-12-10 MED ORDER — HEPARIN (PORCINE) IN NACL 1000-0.9 UT/500ML-% IV SOLN
INTRAVENOUS | Status: AC
  Filled 2021-12-10: qty 1000

## 2021-12-10 MED ORDER — SODIUM CHLORIDE 0.9 % WEIGHT BASED INFUSION
1.0000 mL/kg/h | INTRAVENOUS | Status: DC
  Administered 2021-12-10: 1 mL/kg/h via INTRAVENOUS

## 2021-12-10 MED ORDER — MIDAZOLAM HCL 2 MG/2ML IJ SOLN
INTRAMUSCULAR | Status: AC
  Filled 2021-12-10: qty 2

## 2021-12-10 MED ORDER — LIDOCAINE HCL 1 % IJ SOLN
INTRAMUSCULAR | Status: AC
  Filled 2021-12-10: qty 20

## 2021-12-10 MED ORDER — HYDRALAZINE HCL 20 MG/ML IJ SOLN: 10.0000 mg | INTRAMUSCULAR | Status: AC | PRN

## 2021-12-10 MED ORDER — HEPARIN SODIUM (PORCINE) 1000 UNIT/ML IJ SOLN
INTRAMUSCULAR | Status: AC
  Filled 2021-12-10: qty 10

## 2021-12-10 MED ORDER — FENTANYL CITRATE (PF) 100 MCG/2ML IJ SOLN
INTRAMUSCULAR | Status: DC | PRN
Start: 2021-12-10 — End: 2021-12-10
  Administered 2021-12-10: 50 ug via INTRAVENOUS

## 2021-12-10 MED ORDER — CLOPIDOGREL BISULFATE 75 MG PO TABS
ORAL_TABLET | ORAL | Status: DC | PRN
  Administered 2021-12-10: 300 mg via ORAL

## 2021-12-10 MED ORDER — SODIUM CHLORIDE 0.9 % WEIGHT BASED INFUSION: 3.0000 mL/kg/h | INTRAVENOUS | Status: DC

## 2021-12-10 MED ORDER — SODIUM CHLORIDE 0.9 % IV SOLN: 250.0000 mL | INTRAVENOUS | Status: DC | PRN

## 2021-12-10 MED ORDER — SODIUM CHLORIDE 0.9 % WEIGHT BASED INFUSION: 1.0000 mL/kg/h | INTRAVENOUS | Status: AC

## 2021-12-10 MED ORDER — SODIUM CHLORIDE 0.9% FLUSH: 3.0000 mL | Freq: Two times a day (BID) | INTRAVENOUS | Status: DC

## 2021-12-10 MED ORDER — ALUM & MAG HYDROXIDE-SIMETH 200-200-20 MG/5ML PO SUSP
30.0000 mL | Freq: Four times a day (QID) | ORAL | Status: DC | PRN
  Administered 2021-12-10: 30 mL via ORAL
  Filled 2021-12-10: qty 30

## 2021-12-10 MED ORDER — SODIUM CHLORIDE 0.9% FLUSH
3.0000 mL | Freq: Two times a day (BID) | INTRAVENOUS | Status: DC
  Administered 2021-12-10 – 2021-12-11 (×2): 3 mL via INTRAVENOUS

## 2021-12-10 MED ORDER — HEPARIN (PORCINE) IN NACL 1000-0.9 UT/500ML-% IV SOLN
INTRAVENOUS | Status: DC | PRN
  Administered 2021-12-10 (×2): 500 mL

## 2021-12-10 MED ORDER — HEPARIN SODIUM (PORCINE) 1000 UNIT/ML IJ SOLN
INTRAMUSCULAR | Status: DC | PRN
  Administered 2021-12-10: 6000 [IU] via INTRAVENOUS
  Administered 2021-12-10: 2000 [IU] via INTRAVENOUS
  Administered 2021-12-10: 6000 [IU] via INTRAVENOUS

## 2021-12-10 MED ORDER — VERAPAMIL HCL 2.5 MG/ML IV SOLN
INTRAVENOUS | Status: AC
  Filled 2021-12-10: qty 2

## 2021-12-10 MED ORDER — LABETALOL HCL 5 MG/ML IV SOLN
10.0000 mg | INTRAVENOUS | Status: AC | PRN
  Administered 2021-12-10: 10 mg via INTRAVENOUS
  Filled 2021-12-10: qty 4

## 2021-12-10 MED ORDER — FENTANYL CITRATE (PF) 100 MCG/2ML IJ SOLN
INTRAMUSCULAR | Status: AC
  Filled 2021-12-10: qty 2

## 2021-12-10 MED ORDER — VERAPAMIL HCL 2.5 MG/ML IV SOLN
INTRAVENOUS | Status: DC | PRN
  Administered 2021-12-10: 2.5 mg via INTRA_ARTERIAL

## 2021-12-10 MED ORDER — LIDOCAINE HCL (PF) 1 % IJ SOLN
INTRAMUSCULAR | Status: DC | PRN
  Administered 2021-12-10: 2 mL

## 2021-12-10 MED ORDER — CLOPIDOGREL BISULFATE 75 MG PO TABS
ORAL_TABLET | ORAL | Status: AC
  Filled 2021-12-10: qty 3

## 2021-12-10 MED ORDER — IOHEXOL 300 MG/ML  SOLN
INTRAMUSCULAR | Status: DC | PRN
  Administered 2021-12-10: 152 mL

## 2021-12-10 MED ORDER — ASPIRIN 81 MG PO CHEW: 81.0000 mg | CHEWABLE_TABLET | ORAL | Status: DC

## 2021-12-10 MED ORDER — CLOPIDOGREL BISULFATE 75 MG PO TABS
ORAL_TABLET | ORAL | Status: AC
  Filled 2021-12-10: qty 1

## 2021-12-10 MED ORDER — HEPARIN SODIUM (PORCINE) 5000 UNIT/ML IJ SOLN
5000.0000 [IU] | Freq: Three times a day (TID) | INTRAMUSCULAR | Status: DC
  Administered 2021-12-10 – 2021-12-11 (×3): 5000 [IU] via SUBCUTANEOUS
  Filled 2021-12-10 (×3): qty 1

## 2021-12-10 SURGICAL SUPPLY — 24 items
BALLN SCOREFLEX 3.0X10 (BALLOONS) ×2
BALLN SCOREFLEX 3.50X10 (BALLOONS) ×2
BALLN TREK RX 2.5X12 (BALLOONS) ×2
BALLN ~~LOC~~ TREK NEO RX 4.0X12 (BALLOONS) ×1 IMPLANT
BALLOON SCOREFLEX 3.0X10 (BALLOONS) IMPLANT
BALLOON SCOREFLEX 3.50X10 (BALLOONS) IMPLANT
BALLOON TREK RX 2.5X12 (BALLOONS) IMPLANT
CATH INFINITI 5FR MULTPACK ANG (CATHETERS) ×1 IMPLANT
CATH VISTA GUIDE 6FR JR4 (CATHETERS) ×1 IMPLANT
DEVICE RAD COMP TR BAND LRG (VASCULAR PRODUCTS) ×1 IMPLANT
DRAPE BRACHIAL (DRAPES) ×1 IMPLANT
GLIDESHEATH SLEND SS 6F .021 (SHEATH) ×1 IMPLANT
GUIDEWIRE INQWIRE 1.5J.035X260 (WIRE) IMPLANT
INQWIRE 1.5J .035X260CM (WIRE) ×2
KIT ENCORE 26 ADVANTAGE (KITS) ×1 IMPLANT
NDL PERC 21GX4CM (NEEDLE) IMPLANT
NEEDLE PERC 21GX4CM (NEEDLE) ×2 IMPLANT
PACK CARDIAC CATH (CUSTOM PROCEDURE TRAY) ×2 IMPLANT
PROTECTION STATION PRESSURIZED (MISCELLANEOUS) ×2
SET ATX SIMPLICITY (MISCELLANEOUS) ×1 IMPLANT
STATION PROTECTION PRESSURIZED (MISCELLANEOUS) IMPLANT
STENT ONYX FRONTIER 3.5X15 (Permanent Stent) ×1 IMPLANT
TUBING CIL FLEX 10 FLL-RA (TUBING) ×1 IMPLANT
WIRE RUNTHROUGH .014X180CM (WIRE) ×1 IMPLANT

## 2021-12-10 NOTE — Progress Notes (Signed)
ANTICOAGULATION CONSULT NOTE  Pharmacy Consult for heparin infusion initiation and monitoring Indication: chest pain/ACS  Allergies  Allergen Reactions   Trulicity [Dulaglutide] Other (See Comments)    Headaches     Patient Measurements: Height: 5\' 8"  (172.7 cm) Weight: (!) 137.5 kg (303 lb 2.1 oz) IBW/kg (Calculated) : 68.4 Heparin Dosing Weight: 101.1 kg  Vital Signs: Temp: 98.1 F (36.7 C) (06/08 1205) Temp Source: Oral (06/08 1205) BP: 128/78 (06/08 1205) Pulse Rate: 77 (06/08 1109)  Labs: Recent Labs    12/07/21 2328 12/08/21 0141 12/08/21 0825 12/08/21 1243 12/08/21 1353 12/08/21 1615 12/08/21 2025 12/09/21 0627 12/09/21 1856 12/09/21 2110 12/10/21 0320 12/10/21 1029  HGB 12.5*  --    < > 12.8*  --   --   --  11.8* 13.2  --  12.3*  --   HCT 38.2*  --    < > 39.7  --   --   --  36.0* 40.7  --  37.5*  --   PLT 176  --    < > 164  --   --   --  169 202  --  169  --   APTT  --  32  --   --   --   --   --   --   --   --   --   --   LABPROT  --  15.2  --   --   --   --   --   --   --   --   --   --   INR  --  1.2  --   --   --   --   --   --   --   --   --   --   HEPARINUNFRC  --   --    < >  --    < >  --    < > 0.77*  --   --  0.58 0.65  CREATININE 0.82  --   --  0.76  --   --   --   --  0.90  --   --   --   TROPONINIHS 31* 30*  --  32*  --  31*  --   --  28* 28*  --   --    < > = values in this interval not displayed.     Estimated Creatinine Clearance: 114.1 mL/min (by C-G formula based on SCr of 0.9 mg/dL).   Medical History: Past Medical History:  Diagnosis Date   CAD (coronary artery disease)    a. 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 cardiac cath 01/09/2015: ostLAD 70:, mLAD 99%, ost to pLCx 95%, mLCx 80%, LVEF nl, recommend CABGl; c. s/p 3v CABG 01/14/15  LIMA-LAD, SVG-OM, SVG-Ramus   History of echocardiogram    a. 01/2015 Echo: EF 60-65%, no rwma, nl RV fxn. Nl PASP.   Hyperlipidemia LDL goal <70    Hypertension    Morbid obesity (HCC)    OSA (obstructive sleep apnea)    Type II diabetes mellitus [facility])     Assessment: 66 year old man with medical history significant for CAD s/p CABG, hypertension, hyperlipidemia, morbid obesity, type II DM, who presented to the hospital because of chest pain/chest tightness. A  review of dispense records reveals no DOAC or vitamin K antagonist.  12/10/21 HL @ 1029 = 0.65, therapeutic x 2  Goal of Therapy:  Heparin level 0.3-0.7 units/ml Monitor platelets by anticoagulation protocol: Yes   Plan:  6/8:  HL @ 1029 = 0.65, therapeutic x 2 Will continue this patient on current rate of 1600 units/hr, and recheck HL on 6/9 with AM labs.   Rylyn Ranganathan Al-Daghir 12/10/2021,3:03 PM

## 2021-12-10 NOTE — Progress Notes (Signed)
ANTICOAGULATION CONSULT NOTE  Pharmacy Consult for heparin infusion initiation and monitoring Indication: chest pain/ACS  No Known Allergies  Patient Measurements: Height: 5\' 8"  (172.7 cm) Weight: (!) 137.5 kg (303 lb 2.1 oz) IBW/kg (Calculated) : 68.4 Heparin Dosing Weight: 101.1 kg  Vital Signs: Temp: 98.3 F (36.8 C) (06/07 1855) Temp Source: Oral (06/07 1855) BP: 116/72 (06/08 0230) Pulse Rate: 73 (06/08 0230)  Labs: Recent Labs    12/07/21 2328 12/08/21 0141 12/08/21 0825 12/08/21 1243 12/08/21 1353 12/08/21 1615 12/08/21 2025 12/09/21 0627 12/09/21 1856 12/09/21 2110 12/10/21 0320  HGB 12.5*  --    < > 12.8*  --   --   --  11.8* 13.2  --  12.3*  HCT 38.2*  --    < > 39.7  --   --   --  36.0* 40.7  --  37.5*  PLT 176  --    < > 164  --   --   --  169 202  --  169  APTT  --  32  --   --   --   --   --   --   --   --   --   LABPROT  --  15.2  --   --   --   --   --   --   --   --   --   INR  --  1.2  --   --   --   --   --   --   --   --   --   HEPARINUNFRC  --   --    < >  --    < >  --  0.43 0.77*  --   --  0.58  CREATININE 0.82  --   --  0.76  --   --   --   --  0.90  --   --   TROPONINIHS 31* 30*  --  32*  --  31*  --   --  28* 28*  --    < > = values in this interval not displayed.     Estimated Creatinine Clearance: 114.1 mL/min (by C-G formula based on SCr of 0.9 mg/dL).   Medical History: Past Medical History:  Diagnosis Date   CAD (coronary artery disease)    a. 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 cardiac cath 01/09/2015: ostLAD 70:, mLAD 99%, ost to pLCx 95%, mLCx 80%, LVEF nl, recommend CABGl; c. s/p 3v CABG 01/14/15 LIMA-LAD, SVG-OM, SVG-Ramus   History of echocardiogram    a. 01/2015 Echo: EF 60-65%, no rwma, nl RV fxn. Nl PASP.   Hyperlipidemia LDL  goal <70    Hypertension    Morbid obesity (HCC)    OSA (obstructive sleep apnea)    Type II diabetes mellitus [facility])     Assessment: 66 year old man with medical history significant for CAD s/p CABG, hypertension, hyperlipidemia, morbid obesity, type II DM, who presented to the hospital because of chest pain/chest tightness. A  review of dispense records reveals no DOAC or vitamin K antagonist.  Goal of Therapy:  Heparin level 0.3-0.7 units/ml Monitor platelets by anticoagulation protocol: Yes   Plan:  6/8:  HL @ 0320 = 0.58, therapeutic X 1 Will continue this pt on  current rate and recheck HL on 6/8 @ 0900.   Avalene Sealy D 12/10/2021,4:21 AM

## 2021-12-10 NOTE — Brief Op Note (Signed)
BRIEF CARDIAC CATH-PCI REPORT  12/10/2021 4:26 PM  PCP:  [name], DO       [facility] Cardiologist:  Ida Rogue, MD   ; Cadence Kathlen Mody, PA-C  PROCEDURE:  Procedure(s): LEFT HEART CATH AND CORONARY ANGIOGRAPHY (N/A) CORONARY STENT INTERVENTION (N/A)  SURGEON:  Surgeon(s) and Role:    * Leonie Man, MD - Primary   PATIENT:  Craig Harvey is a 66 y.o. male with a hx of CAD s/p CABGx3, HTN, HLD, diabetes, OSA on CPAP, obesity who was being seen by Dr. Rockey Situ on 12/10/2021 for the evaluation of chest pain-concerning for unstable angina.  Based on the clinical scenario, Dr. Rockey Situ recommended cardiac catheterization with possible PCI.  PRE-OPERATIVE DIAGNOSIS:  unstable angina  POST-OPERATIVE DIAGNOSIS:   Severe Native CAD:  100% CTO of proximal LCx after OM1/RI, 90% followed 100% CTO of LAD after SP1;  Widely patent ostial RI CULPRIT LESION 95% focal (napkin ring) proximal RCA Successful scoring balloon (sequential 3.0 mm & 3.5 mm ScoreFlex)angioplasty followed by DES PCI of proximal RCA reducing 95% to 0% stenosis; TIMI-3 flow pre and post.  Onyx frontier DES 3.5 mm x 15 mm postdilated to 4.1 mm. Distal LAD has tandem 50% lesions, distal LCx with retrograde flow reveals 80% stenosis prior to anastomosis. 2 of 3 grafts patent: SVG-OM, LIMA-LAD with flush occlusion of SVG-RI NORMAL LVEDP.  LV gram not performed-Echo done today.  PROCEDURE  Time Out: Verified patient identification, verified procedure, site/side was marked, verified correct patient position, special equipment/implants available, medications/allergies/relevent history reviewed, required imaging and test results available. Performed.  Access:  LEFT Radial Artery: 6 Fr sheath -- Seldinger technique using Micropuncture Kit -- Direct ultrasound guidance used.  Permanent image obtained and placed on chart. -- 10 mL radial cocktail IA; 6000 Units IV Heparin  Left Heart Catheterization: 5&6Fr  Catheters advanced or exchanged over a J-wire under direct fluoroscopic guidance into the ascending aorta; JR4 catheter advanced first.  * LV Hemodynamics (no LV Gram): 5 Fr JR4 catheter  * Right Coronary Artery, SVG-RI  & SVG-OM1 Cineangiography: 5 Fr JR4 Catheter  * Left Coronary Artery Cineangiography: 5 Fr JL 4 catheter  * LIMA-LAD Cineangiography: JR4 catheter was redirected into Left Subclavian Artery over long-exchange wire for IMA engagement.  Review of initial angiography revealed: CULPRIT LESION 95% proximal native RCA; 100% flush occlusion of SVG-RI with widely patent RI; widely patent SVG-OM with occluded proximal LCx; widely patent LIMA-LAD with 90% followed by 10 percent CTO of LAD (upstream LAD has retrograde flow to the diagonal branch, downstream LAD wraps the apex and provides collaterals to the RCA; there are tandem 50% lesions in the distal LAD)  Preparations are made for PCI of the proximal RCA: -> Additional bolus of IV heparin administered to achieve and maintain an ACT > 250 sec --> He is on Plavix, but additional 300 mg Plavix given at the end of the procedure.  RCA PCI performed: (Complex) see FINDINGS -> 6 [place] JR4 guide catheter, Prowater wire, 2.5 mm balloon followed by 3.0 and then 3.5 mm ScoreFlex balloons to allow for full stent expansion.  3.5 x 15 mm DES Onyx frontier stent postdilated to 4.1 mm  Upon completion of Angiogaphy, the catheter was removed completely out of the body over a wire, without complication.  Radial sheath removed in the Cardiac Catheterization lab with Zephyr pneumatic band placed for hemostasis.  MEDICATIONS SQ Lidocaine 3 mL Radial Cocktail: 3 mg Verapmil in 10 mL  NS Heparin: 14,000 units P.o. Plavix 300 mg  EBL:  <20 mL  DICTATION: .Note written in [place]:  Monitor overnight post PCI, continue aggressive cardiovascular risk factor modification with guideline medical therapy.  Anticipate morning discharge.  Phase 2  cardiac rehab ordered.  PATIENT DISPOSITION:  PACU - hemodynamically stable.   Delay start of Pharmacological VTE agent (>24hrs) due to surgical blood loss or risk of bleeding: not applicable    [name], MD

## 2021-12-10 NOTE — Interval H&P Note (Signed)
History and Physical Interval Note:  12/10/2021 2:46 PM  Craig Harvey  has presented today for surgery, with the diagnosis of unstable angina.  The various methods of treatment have been discussed with the patient and family. After consideration of risks, benefits and other options for treatment, the patient has consented to  Procedure(s): LEFT HEART CATH AND CORONARY ANGIOGRAPHY (N/A) with GRAFT ANGIOGRAPHY PERCUTANEOUS CORONARY INTERVENTION   as a surgical intervention.  The patient's history has been reviewed, patient examined, no change in status, stable for surgery.  I have reviewed the patient's chart and labs.  Questions were answered to the patient's satisfaction.    Cath Lab Visit (complete for each Cath Lab visit)  Clinical Evaluation Leading to the Procedure:   ACS: Yes.    Non-ACS:    Anginal Classification: CCS IV  Anti-ischemic medical therapy: Minimal Therapy (1 class of medications)  Non-Invasive Test Results: No non-invasive testing performed  Prior CABG: Previous CABG   Craig Harvey

## 2021-12-10 NOTE — Telephone Encounter (Signed)
LVM to return call.

## 2021-12-10 NOTE — Progress Notes (Signed)
Progress Note    Craig Harvey  ZOX:[id number] DOB: [birth year]  DOA: 12/09/2021 PCP: Craig Cords, DO      Brief Narrative:    Medical records reviewed and are as summarized below:  Craig Harvey is a 66 y.o. male with medical history significant for CAD s/p CABG, hypertension, hyperlipidemia, morbid obesity, type II DM.  He was just discharged from the hospital on 12/09/2021 after hospitalization on 12/07/2021 for unstable angina.  He was treated with IV heparin and Imdur was increased from 30 to 60 mg daily.  Because of improvement in his symptoms, outpatient follow-up for stress test was recommended.  He presented to the hospital again, on the same day of discharge, with chest pain.  He was admitted to the hospital for unstable angina.  He was treated with IV heparin drip.      Assessment/Plan:   Principal Problem:   Unstable angina (HCC) Active Problems:   CAD s/p CABG x 3   DM (diabetes mellitus), type 2 with peripheral vascular complications (HCC)   OSA on CPAP   Obesity, Class III, BMI 40-49.9 (morbid obesity) (HCC)   Hypertension    Body mass index is 46.09 kg/m.  (Morbid obesity)  Unstable angina, CAD with previous CABG: Continue IV heparin drip and monitor heparin level per protocol.  Continue aspirin, Plavix, metoprolol, lisinopril, Imdur and Lipitor.  Plan for left heart cath today.  Follow-up with cardiologist.  Hypertension: Continue antihypertensives  Type II DM: NovoLog as needed for hyperglycemia  OSA: CPAP at night    Diet Order             Diet NPO time specified  Diet effective now                            Consultants: Cardiologist  Procedures: Plan for left heart cath today    Medications:    aspirin  81 mg Oral Pre-Cath   [MAR Hold] aspirin EC  81 mg Oral Daily   [MAR Hold] atorvastatin  80 mg Oral Daily   [MAR Hold] clopidogrel  75 mg Oral Daily   [MAR Hold] ezetimibe  10 mg Oral Daily   [MAR  Hold] insulin aspart  0-20 Units Subcutaneous TID WC   [MAR Hold] insulin aspart  0-5 Units Subcutaneous QHS   [MAR Hold] isosorbide mononitrate  60 mg Oral Daily   [MAR Hold] lisinopril  10 mg Oral Daily   [MAR Hold] metoprolol succinate  50 mg Oral Daily   [MAR Hold] sodium chloride flush  3 mL Intravenous Q12H   Continuous Infusions:  sodium chloride     sodium chloride     heparin 1,600 Units/hr (12/10/21 1108)     Anti-infectives (From admission, onward)    None              Family Communication/Anticipated D/C date and plan/Code Status   DVT prophylaxis:      Code Status: Full Code  Family Communication: None Disposition Plan: Plan to discharge home in 1 to 2 days   Status is: Observation The patient will require care spanning > 2 midnights and should be moved to inpatient because: Left heart cath, chest pain        Subjective:   Interval events noted.  No chest pain or shortness of breath.  Objective:    Vitals:   12/10/21 1053 12/10/21 1109 12/10/21 1205 12/10/21 1444  BP: Marland Kitchen)  149/86 (!) 162/90 128/78   Pulse: 81 77    Resp:  16 16   Temp:   98.1 F (36.7 C)   TempSrc:   Oral   SpO2:  100% 100% 94%  Weight:      Height:       No data found.  No intake or output data in the 24 hours ending 12/10/21 1526 Filed Weights   12/09/21 1854  Weight: (!) 137.5 kg    Exam:  GEN: NAD SKIN: No rash EYES: EOMI ENT: MMM CV: RRR PULM: CTA B ABD: soft, obese, NT, +BS CNS: AAO x 3, non focal EXT: No edema or tenderness        Data Reviewed:   I have personally reviewed following labs and imaging studies:  Labs: Labs show the following:   Basic Metabolic Panel: Recent Labs  Lab 12/07/21 2328 12/08/21 1243 12/09/21 1856  NA 129* 132* 130*  K 3.9 4.1 3.4*  CL 98 100 98  CO2 22 24 19*  GLUCOSE 100* 117* 138*  BUN 16 11 12   CREATININE 0.82 0.76 0.90  CALCIUM 9.3 9.2 9.2   GFR Estimated Creatinine Clearance: 114.1  mL/min (by C-G formula based on SCr of 0.9 mg/dL). Liver Function Tests: Recent Labs  Lab 12/07/21 2328  AST 36  ALT 43  ALKPHOS 43  BILITOT 0.6  PROT 7.5  ALBUMIN 4.1   No results for input(s): "LIPASE", "AMYLASE" in the last 168 hours. No results for input(s): "AMMONIA" in the last 168 hours. Coagulation profile Recent Labs  Lab 12/08/21 0141  INR 1.2    CBC: Recent Labs  Lab 12/07/21 2328 12/08/21 0825 12/08/21 1243 12/09/21 0627 12/09/21 1856 12/10/21 0320  WBC 5.2 4.5 4.2 4.6 4.4 4.9  NEUTROABS 3.5  --   --   --   --   --   HGB 12.5* 12.3* 12.8* 11.8* 13.2 12.3*  HCT 38.2* 37.5* 39.7 36.0* 40.7 37.5*  MCV 83.6 83.9 84.6 84.1 83.9 83.3  PLT 176 147* 164 169 202 169   Cardiac Enzymes: No results for input(s): "CKTOTAL", "CKMB", "CKMBINDEX", "TROPONINI" in the last 168 hours. BNP (last 3 results) No results for input(s): "PROBNP" in the last 8760 hours. CBG: Recent Labs  Lab 12/08/21 2355 12/09/21 0357 12/09/21 0750 12/10/21 0318 12/10/21 1142  GLUCAP 172* 110* 106* 135* 150*   D-Dimer: No results for input(s): "DDIMER" in the last 72 hours. Hgb A1c: No results for input(s): "HGBA1C" in the last 72 hours. Lipid Profile: Recent Labs    12/08/21 1243  CHOL 88  HDL 34*  LDLCALC 41  TRIG 63  CHOLHDL 2.6   Thyroid function studies: Recent Labs    12/08/21 1243  TSH 0.888   Anemia work up: No results for input(s): "VITAMINB12", "FOLATE", "FERRITIN", "TIBC", "IRON", "RETICCTPCT" in the last 72 hours. Sepsis Labs: Recent Labs  Lab 12/08/21 1243 12/09/21 0627 12/09/21 1856 12/10/21 0320  WBC 4.2 4.6 4.4 4.9    Microbiology No results found for this or any previous visit (from the past 240 hour(s)).  Procedures and diagnostic studies:  DG Chest 2 View  Result Date: 12/09/2021 CLINICAL DATA:  Chest pain. EXAM: CHEST - 2 VIEW COMPARISON:  12/07/2021 FINDINGS: The heart size and mediastinal contours are within normal limits. Prior CABG.  There is no evidence of pulmonary edema, consolidation, pneumothorax, nodule or pleural fluid. The visualized skeletal structures are unremarkable. IMPRESSION: No active cardiopulmonary disease. Electronically Signed   By: Irish LackGlenn  Yamagata  M.D.   On: 12/09/2021 19:14   ECHOCARDIOGRAM COMPLETE  Result Date: 12/09/2021    ECHOCARDIOGRAM REPORT   Patient Name:   Craig Harvey Date of Exam: 12/08/2021 Medical Rec #:  [id number]       Height:       68.0 in Accession #:    [id number]      Weight:       302.0 lb Date of Birth:  1956-03-20      BSA:          2.435 m Patient Age:    65 years        BP:           100/60 mmHg Patient Gender: M               HR:           85 bpm. Exam Location:  [facility] Procedure: 2D Echo, Cardiac Doppler and Color Doppler Indications:     I21.4 NSTEMI  History:         Patient has prior history of Echocardiogram examinations, most                  recent 01/14/2015. CAD; Risk Factors:Sleep Apnea, Diabetes,                  Hypertension and Dyslipidemia.  Sonographer:     Daphine Deutscher RDCS Referring Phys:  [id number] Tonye Royalty Diagnosing Phys: Debbe Odea MD IMPRESSIONS  1. Left ventricular ejection fraction, by estimation, is 60 to 65%. The left ventricle has normal function. The left ventricle has no regional wall motion abnormalities. There is mild left ventricular hypertrophy. Left ventricular diastolic parameters are consistent with Grade I diastolic dysfunction (impaired relaxation).  2. Right ventricular systolic function is normal. The right ventricular size is normal.  3. The mitral valve is grossly normal. No evidence of mitral valve regurgitation.  4. The aortic valve was not well visualized. Aortic valve regurgitation is not visualized.  5. Aortic dilatation noted. There is borderline dilatation of the aortic root, measuring 39 mm.  6. The inferior vena cava is normal in size with greater than 50% respiratory variability, suggesting right atrial pressure of 3  mmHg. FINDINGS  Left Ventricle: Left ventricular ejection fraction, by estimation, is 60 to 65%. The left ventricle has normal function. The left ventricle has no regional wall motion abnormalities. The left ventricular internal cavity size was normal in size. There is  mild left ventricular hypertrophy. Left ventricular diastolic parameters are consistent with Grade I diastolic dysfunction (impaired relaxation). Right Ventricle: The right ventricular size is normal. No increase in right ventricular wall thickness. Right ventricular systolic function is normal. Left Atrium: Left atrial size was normal in size. Right Atrium: Right atrial size was normal in size. Pericardium: There is no evidence of pericardial effusion. Mitral Valve: The mitral valve is grossly normal. No evidence of mitral valve regurgitation. Tricuspid Valve: The tricuspid valve is grossly normal. Tricuspid valve regurgitation is not demonstrated. Aortic Valve: The aortic valve was not well visualized. Aortic valve regurgitation is not visualized. Pulmonic Valve: The pulmonic valve was not well visualized. Pulmonic valve regurgitation is not visualized. Aorta: Aortic dilatation noted. There is borderline dilatation of the aortic root, measuring 39 mm. Venous: The inferior vena cava is normal in size with greater than 50% respiratory variability, suggesting right atrial pressure of 3 mmHg. IAS/Shunts: No atrial level shunt detected by color flow Doppler.  LEFT VENTRICLE PLAX 2D LVIDd:  4.86 cm   Diastology LVIDs:         2.92 cm   LV e' medial:    6.42 cm/s LV PW:         1.08 cm   LV E/e' medial:  8.7 LV IVS:        1.12 cm   LV e' lateral:   11.10 cm/s LVOT diam:     2.50 cm   LV E/e' lateral: 5.0 LV SV:         61 LV SV Index:   25 LVOT Area:     4.91 cm  RIGHT VENTRICLE            IVC RV Basal diam:  3.94 cm    IVC diam: 1.53 cm RV S prime:     6.64 cm/s LEFT ATRIUM             Index        RIGHT ATRIUM           Index LA diam:         3.80 cm 1.56 cm/m   RA Area:     12.60 cm LA Vol (A2C):   46.2 ml 18.97 ml/m  RA Volume:   30.50 ml  12.53 ml/m LA Vol (A4C):   35.8 ml 14.70 ml/m LA Biplane Vol: 42.9 ml 17.62 ml/m  AORTIC VALVE LVOT Vmax:   90.80 cm/s LVOT Vmean:  67.400 cm/s LVOT VTI:    0.125 m  AORTA Ao Root diam: 3.90 cm Ao Asc diam:  3.60 cm MITRAL VALVE MV Area (PHT): 3.60 cm    SHUNTS MV Decel Time: 211 msec    Systemic VTI:  0.12 m MV E velocity: 55.70 cm/s  Systemic Diam: 2.50 cm MV A velocity: 73.30 cm/s MV E/A ratio:  0.76 Debbe Odea MD Electronically signed by Debbe Odea MD Signature Date/Time: 12/09/2021/5:38:29 PM    Final                LOS: 0 days   Dazja Houchin  [facility]   Pager on [email or web address]. If 7PM-7AM, please contact night-coverage at [email or web address]     12/10/2021, 3:26 PM

## 2021-12-10 NOTE — Consult Note (Signed)
Cardiology Consultation:   Patient ID: Craig Harvey MRN: [id number]; DOB: December 25, 1955  Admit date: 12/09/2021 Date of Consult: 12/10/2021  PCP:  Smitty Cords, DO   [facility] Providers Cardiologist:  Julien Nordmann, MD   {   Patient Profile:   Craig Harvey is a 66 y.o. male with a hx of CAD s/p CABGx3, HTN, HLD, diabetes, OSA on CPAP, obesity who is being seen 12/10/2021 for the evaluation of chest pain at the request of Dr. Myriam Forehand.  History of Present Illness:   [name] had prior CABG in 2016  He was just recently discharged from the hospital 6/7. He was admitted for chest pain with minimally elevated troponin 31>30>32>31. BP was also mildly elevated. EKG with no acute changes. Echo showed normal LVEF with no WMA and G1DD. Imdur was increased and he was discharged with plan to discuss further options as outpatient   After discharge, he returned to the ER later that same day for recurrent chest pain. He left around 12 an around 5 he started having chest tightness. He took an Imdur  with minimal improvement. When the pain didn't go away he came back to the ER. It is on the left side. No SOB. No LLE, orthopnea, pnd. Patient is wanting to discuss heart cath. He is currently chest pain free.   In the ER BP 148/100, pulse 68bpm, afebrile, RR 16, 9% O2. HS trop 28>28. NA 130, K 3.4, bicarb 19. CXR unremarkable. EKG with no ischemic changes. The patient was started on IV heparin and admitted.   Past Medical History:  Diagnosis Date   CAD (coronary artery disease)    a. 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 cardiac cath 01/09/2015: ostLAD 70:, mLAD 99%, ost to pLCx 95%, mLCx 80%, LVEF nl, recommend CABGl; c. s/p 3v CABG 01/14/15 LIMA-LAD, SVG-OM, SVG-Ramus   History of  echocardiogram    a. 01/2015 Echo: EF 60-65%, no rwma, nl RV fxn. Nl PASP.   Hyperlipidemia LDL goal <70    Hypertension    Morbid obesity (HCC)    OSA (obstructive sleep apnea)    Type II diabetes mellitus (HCC)     Past Surgical History:  Procedure Laterality Date   CARDIAC CATHETERIZATION N/A 01/09/2015   Procedure: Left Heart Cath and Coronary Angiography;  Surgeon: Antonieta Iba, MD;  Location: [facility] INVASIVE CV LAB;  Service: Cardiovascular;  Laterality: N/A;   CORONARY ARTERY BYPASS GRAFT N/A 01/14/2015   Procedure: CORONARY ARTERY BYPASS GRAFTING (CABG), ON PUMP, TIMES THREE, USING LEFT INTERNAL MAMMARY ARTERY, RIGHT GREATER SAPHENOUS VEIN HARVESTED ENDOSCOPICALLY;  Surgeon: Kerin Perna, MD;  Location: [facility] OR;  Service: Open Heart Surgery;  Laterality: N/A;   HERNIA REPAIR     TEE WITHOUT CARDIOVERSION N/A 01/14/2015   Procedure: TRANSESOPHAGEAL ECHOCARDIOGRAM (TEE);  Surgeon: Kerin Perna, MD;  Location: [facility] OR;  Service: Open Heart Surgery;  Laterality: N/A;     Home Medications:  Prior to Admission medications   Medication Sig Start Date End Date Taking? Authorizing Provider  aspirin EC 81 MG tablet Take 1 tablet (81 mg total) by mouth daily. 11/18/17  Yes Antonieta Iba, MD  atorvastatin (LIPITOR) 80 MG tablet Take 1 tablet (80 mg total) by mouth daily. 08/31/21  Yes Gollan, Tollie Pizza, MD  baclofen (LIORESAL)  10 MG tablet Take 0.5-1 tablets (5-10 mg total) by mouth 2 (two) times daily as needed for muscle spasms (leg pain). 07/22/20  Yes Karamalegos, Netta Neat, DO  BD VEO INSULIN SYRINGE U/F 31G X 15/64" 0.3 ML MISC USE AS DIRECTED TWICE DAILY 10/21/21  Yes Karamalegos, Netta Neat, DO  clopidogrel (PLAVIX) 75 MG tablet Take 1 tablet (75 mg total) by mouth daily. 07/23/21  Yes Karamalegos, Netta Neat, DO  ezetimibe (ZETIA) 10 MG tablet Take 1 tablet (10 mg total) by mouth daily. 07/23/21  Yes Karamalegos, Netta Neat, DO  glucose blood (ACCU-CHEK AVIVA PLUS) test strip Use  to check blood sugar up to 3 x daily 07/22/20  Yes Karamalegos, Netta Neat, DO  hydrochlorothiazide (HYDRODIURIL) 25 MG tablet Take 1 tablet (25 mg total) by mouth daily. 07/23/21  Yes Karamalegos, Netta Neat, DO  insulin aspart (NOVOLOG) 100 UNIT/ML injection Inject 0-20 Units into the skin 3 (three) times daily before meals.   Yes [provider]  insulin lispro protamine-lispro (HUMALOG MIX 75/25) (75-25) 100 UNIT/ML SUSP injection INJECT 17 UNITS SUBCUTANEOUSLY TWICE DAILY WITH A MEAL. MAX OF 50 UNITS DAILY. 07/23/21  Yes Karamalegos, Netta Neat, DO  isosorbide mononitrate (IMDUR) 60 MG 24 hr tablet Take 1 tablet (60 mg total) by mouth daily. 12/09/21  Yes Lurene Shadow, MD  lisinopril (ZESTRIL) 10 MG tablet Take 1 tablet (10 mg total) by mouth daily. 07/23/21  Yes Karamalegos, Netta Neat, DO  metFORMIN (GLUCOPHAGE) 1000 MG tablet TAKE 1 TABLET BY MOUTH TWICE DAILY WITH A MEAL 07/23/21  Yes Karamalegos, Netta Neat, DO  metoprolol succinate (TOPROL-XL) 50 MG 24 hr tablet Take with or immediately following a meal. 07/23/21  Yes Karamalegos, Netta Neat, DO  Syringe, Disposable, 3 ML MISC Use to inject humalog mix 75/25 17u BID 01/25/19  Yes Smitty Cords, DO    Inpatient Medications: Scheduled Meds:  aspirin EC  81 mg Oral Daily   atorvastatin  80 mg Oral Daily   clopidogrel  75 mg Oral Daily   ezetimibe  10 mg Oral Daily   insulin aspart  0-20 Units Subcutaneous TID WC   insulin aspart  0-5 Units Subcutaneous QHS   isosorbide mononitrate  60 mg Oral Daily   lisinopril  10 mg Oral Daily   metoprolol succinate  50 mg Oral Daily   Continuous Infusions:  heparin 1,600 Units/hr (12/09/21 2237)   PRN Meds: acetaminophen, nitroGLYCERIN, ondansetron (ZOFRAN) IV  Allergies:   No Known Allergies  Social History:   Social History   Socioeconomic History   Marital status: Single    Spouse name: Not on file   Number of children: Not on file   Years of education: Not on  file   Highest education level: Not on file  Occupational History   Occupation: Former Naval architect / Production designer, theatre/television/film  Tobacco Use   Smoking status: Former    Types: Cigarettes    Quit date: 08/06/1975    Years since quitting: 46.3   Smokeless tobacco: Never  Vaping Use   Vaping Use: Never used  Substance and Sexual Activity   Alcohol use: No   Drug use: No   Sexual activity: Not on file  Other Topics Concern   Not on file  Social History Narrative   Not on file   Social Determinants of Health   Financial Resource Strain: Low Risk  (05/20/2021)   Overall Financial Resource Strain (CARDIA)    Difficulty of Paying Living Expenses: Not hard  at all  Food Insecurity: No Food Insecurity (05/20/2021)   Hunger Vital Sign    Worried About Running Out of Food in the Last Year: Never true    Ran Out of Food in the Last Year: Never true  Transportation Needs: No Transportation Needs (05/20/2021)   PRAPARE - Administrator, Civil ServiceTransportation    Lack of Transportation (Medical): No    Lack of Transportation (Non-Medical): No  Physical Activity: Sufficiently Active (05/20/2021)   Exercise Vital Sign    Days of Exercise per Week: 4 days    Minutes of Exercise per Session: 40 min  Stress: No Stress Concern Present (05/20/2021)   [employer] - Occupational Stress Questionnaire    Feeling of Stress : Not at all  Social Connections: Socially Isolated (05/20/2021)   Social Connection and Isolation Panel [NHANES]    Frequency of Communication with Friends and Family: Three times a week    Frequency of Social Gatherings with Friends and Family: Three times a week    Attends Religious Services: Never    Active Member of Clubs or Organizations: No    Attends BankerClub or Organization Meetings: Never    Marital Status: Never married  Catering managerntimate Partner Violence: Not on file    Family History:    Family History  Problem Relation Age of Onset   CAD Mother    CAD Brother    Throat  cancer Father    Diabetes Neg Hx    Cancer Neg Hx    Prostate cancer Neg Hx    Colon cancer Neg Hx      ROS:  Please see the history of present illness.   All other ROS reviewed and negative.     Physical Exam/Data:   Vitals:   12/10/21 0500 12/10/21 0530 12/10/21 0600 12/10/21 0630  BP: 128/70 136/79 (!) 140/93 (!) 156/102  Pulse: 80 68 77 80  Resp: 19 (!) 24 (!) 24 (!) 21  Temp:      TempSrc:      SpO2: 96% 97% 98% 93%  Weight:      Height:       No intake or output data in the 24 hours ending 12/10/21 0803    12/09/2021    6:54 PM 12/09/2021   12:56 AM 12/07/2021   10:17 PM  Last 3 Weights  Weight (lbs) 303 lb 2.1 oz 303 lb 3.2 oz 302 lb  Weight (kg) 137.5 kg 137.531 kg 136.986 kg     Body mass index is 46.09 kg/m.  General:  Well nourished, well developed, in no acute distress HEENT: normal Neck: no JVD Vascular: No carotid bruits; Distal pulses 2+ bilaterally Cardiac:  normal S1, S2; RRR; no murmur  Lungs:  clear to auscultation bilaterally, no wheezing, rhonchi or rales  Abd: soft, nontender, no hepatomegaly  Ext: no edema Musculoskeletal:  No deformities, BUE and BLE strength normal and equal Skin: warm and dry  Neuro:  CNs 2-12 intact, no focal abnormalities noted Psych:  Normal affect   EKG:  The EKG was personally reviewed and demonstrates:  NSR 1st degree AV block 69bpm, nonspecific T wave changes Telemetry:  Telemetry was personally reviewed and demonstrates:  NSR HR 70-80s  Relevant CV Studies:  Echo 12/2021  1. Left ventricular ejection fraction, by estimation, is 60 to 65%. The  left ventricle has normal function. The left ventricle has no regional  wall motion abnormalities. There is mild left ventricular hypertrophy.  Left ventricular diastolic parameters  are consistent  with Grade I diastolic dysfunction (impaired relaxation).   2. Right ventricular systolic function is normal. The right ventricular  size is normal.   3. The mitral valve is  grossly normal. No evidence of mitral valve  regurgitation.   4. The aortic valve was not well visualized. Aortic valve regurgitation  is not visualized.   5. Aortic dilatation noted. There is borderline dilatation of the aortic  root, measuring 39 mm.   6. The inferior vena cava is normal in size with greater than 50%  respiratory variability, suggesting right atrial pressure of 3 mmHg.   LHC (01/09/2015): Right dominant coronary arterial system Severe two-vessel disease: Ostial LAD estimated at 70-80%, eccentric calcification, LAD is a large vessel Also with critical proximal LAD lesion estimated at 95-99%. Proximal circumflex with severe 95% lesion after the takeoff of OM1, long, calcified, circumflex is a large vessel Mid circumflex also with 80% lesion, focal, prior to the takeoff of OM 2 Right coronary artery with mild diffuse disease   MPI (01/08/2015): HIGH RISK Test due to Poor Exercise Tolerance, Large-partially reversible perfusion defect in the likely LAD distribution.   TTE (01/06/2015): - Left ventricle: The cavity size was normal. Systolic function was    normal. The estimated ejection fraction was in the range of 60%    to 65%. Wall motion was normal; there were no regional wall    motion abnormalities. Left ventricular diastolic function    parameters were normal.  - Aortic valve: Valve area (Vmax): 4.8 cm^2.  - Left atrium: The atrium was normal in size.  - Right ventricle: Systolic function was normal.  - Pulmonary arteries: Systolic pressure was within the normal    range.   Laboratory Data:  High Sensitivity Troponin:   Recent Labs  Lab 12/08/21 0141 12/08/21 1243 12/08/21 1615 12/09/21 1856 12/09/21 2110  TROPONINIHS 30* 32* 31* 28* 28*     Chemistry Recent Labs  Lab 12/07/21 2328 12/08/21 1243 12/09/21 1856  NA 129* 132* 130*  K 3.9 4.1 3.4*  CL 98 100 98  CO2 22 24 19*  GLUCOSE 100* 117* 138*  BUN 16 11 12   CREATININE 0.82 0.76 0.90  CALCIUM  9.3 9.2 9.2  GFRNONAA >60 >60 >60  ANIONGAP 9 8 13     Recent Labs  Lab 12/07/21 2328  PROT 7.5  ALBUMIN 4.1  AST 36  ALT 43  ALKPHOS 43  BILITOT 0.6   Lipids  Recent Labs  Lab 12/08/21 1243  CHOL 88  TRIG 63  HDL 34*  LDLCALC 41  CHOLHDL 2.6    Hematology Recent Labs  Lab 12/09/21 0627 12/09/21 1856 12/10/21 0320  WBC 4.6 4.4 4.9  RBC 4.28 4.85 4.50  HGB 11.8* 13.2 12.3*  HCT 36.0* 40.7 37.5*  MCV 84.1 83.9 83.3  MCH 27.6 27.2 27.3  MCHC 32.8 32.4 32.8  RDW 14.0 14.1 14.1  PLT 169 202 169   Thyroid  Recent Labs  Lab 12/08/21 1243  TSH 0.888    BNPNo results for input(s): "BNP", "PROBNP" in the last 168 hours.  DDimer No results for input(s): "DDIMER" in the last 168 hours.   Radiology/Studies:  DG Chest 2 View  Result Date: 12/09/2021 CLINICAL DATA:  Chest pain. EXAM: CHEST - 2 VIEW COMPARISON:  12/07/2021 FINDINGS: The heart size and mediastinal contours are within normal limits. Prior CABG. There is no evidence of pulmonary edema, consolidation, pneumothorax, nodule or pleural fluid. The visualized skeletal structures are unremarkable. IMPRESSION: No active cardiopulmonary  disease. Electronically Signed   By: Irish Lack M.D.   On: 12/09/2021 19:14   ECHOCARDIOGRAM COMPLETE  Result Date: 12/09/2021    ECHOCARDIOGRAM REPORT   Patient Name:   Craig Harvey Date of Exam: 12/08/2021 Medical Rec #:  [id number]       Height:       68.0 in Accession #:    [id number]      Weight:       302.0 lb Date of Birth:  03/09/1956      BSA:          2.435 m Patient Age:    65 years        BP:           100/60 mmHg Patient Gender: M               HR:           85 bpm. Exam Location:  [facility] Procedure: 2D Echo, Cardiac Doppler and Color Doppler Indications:     I21.4 NSTEMI  History:         Patient has prior history of Echocardiogram examinations, most                  recent 01/14/2015. CAD; Risk Factors:Sleep Apnea, Diabetes,                  Hypertension and Dyslipidemia.   Sonographer:     Daphine Deutscher RDCS Referring Phys:  [id number] Tonye Royalty Diagnosing Phys: Debbe Odea MD IMPRESSIONS  1. Left ventricular ejection fraction, by estimation, is 60 to 65%. The left ventricle has normal function. The left ventricle has no regional wall motion abnormalities. There is mild left ventricular hypertrophy. Left ventricular diastolic parameters are consistent with Grade I diastolic dysfunction (impaired relaxation).  2. Right ventricular systolic function is normal. The right ventricular size is normal.  3. The mitral valve is grossly normal. No evidence of mitral valve regurgitation.  4. The aortic valve was not well visualized. Aortic valve regurgitation is not visualized.  5. Aortic dilatation noted. There is borderline dilatation of the aortic root, measuring 39 mm.  6. The inferior vena cava is normal in size with greater than 50% respiratory variability, suggesting right atrial pressure of 3 mmHg. FINDINGS  Left Ventricle: Left ventricular ejection fraction, by estimation, is 60 to 65%. The left ventricle has normal function. The left ventricle has no regional wall motion abnormalities. The left ventricular internal cavity size was normal in size. There is  mild left ventricular hypertrophy. Left ventricular diastolic parameters are consistent with Grade I diastolic dysfunction (impaired relaxation). Right Ventricle: The right ventricular size is normal. No increase in right ventricular wall thickness. Right ventricular systolic function is normal. Left Atrium: Left atrial size was normal in size. Right Atrium: Right atrial size was normal in size. Pericardium: There is no evidence of pericardial effusion. Mitral Valve: The mitral valve is grossly normal. No evidence of mitral valve regurgitation. Tricuspid Valve: The tricuspid valve is grossly normal. Tricuspid valve regurgitation is not demonstrated. Aortic Valve: The aortic valve was not well visualized. Aortic  valve regurgitation is not visualized. Pulmonic Valve: The pulmonic valve was not well visualized. Pulmonic valve regurgitation is not visualized. Aorta: Aortic dilatation noted. There is borderline dilatation of the aortic root, measuring 39 mm. Venous: The inferior vena cava is normal in size with greater than 50% respiratory variability, suggesting right atrial pressure of 3 mmHg. IAS/Shunts: No atrial level shunt detected by  color flow Doppler.  LEFT VENTRICLE PLAX 2D LVIDd:         4.86 cm   Diastology LVIDs:         2.92 cm   LV e' medial:    6.42 cm/s LV PW:         1.08 cm   LV E/e' medial:  8.7 LV IVS:        1.12 cm   LV e' lateral:   11.10 cm/s LVOT diam:     2.50 cm   LV E/e' lateral: 5.0 LV SV:         61 LV SV Index:   25 LVOT Area:     4.91 cm  RIGHT VENTRICLE            IVC RV Basal diam:  3.94 cm    IVC diam: 1.53 cm RV S prime:     6.64 cm/s LEFT ATRIUM             Index        RIGHT ATRIUM           Index LA diam:        3.80 cm 1.56 cm/m   RA Area:     12.60 cm LA Vol (A2C):   46.2 ml 18.97 ml/m  RA Volume:   30.50 ml  12.53 ml/m LA Vol (A4C):   35.8 ml 14.70 ml/m LA Biplane Vol: 42.9 ml 17.62 ml/m  AORTIC VALVE LVOT Vmax:   90.80 cm/s LVOT Vmean:  67.400 cm/s LVOT VTI:    0.125 m  AORTA Ao Root diam: 3.90 cm Ao Asc diam:  3.60 cm MITRAL VALVE MV Area (PHT): 3.60 cm    SHUNTS MV Decel Time: 211 msec    Systemic VTI:  0.12 m MV E velocity: 55.70 cm/s  Systemic Diam: 2.50 cm MV A velocity: 73.30 cm/s MV E/A ratio:  0.76 Debbe Odea MD Electronically signed by Debbe Odea MD Signature Date/Time: 12/09/2021/5:38:29 PM    Final    DG Chest 2 View  Result Date: 12/07/2021 CLINICAL DATA:  chest tightness EXAM: CHEST - 2 VIEW COMPARISON:  Chest x-ray 02/04/2015 FINDINGS: The heart and mediastinal contours are within normal limits. No focal consolidation. Slightly increased interstitial markings. No pleural effusion. No pneumothorax. No acute osseous abnormality.  Intact sternotomy  wires. IMPRESSION: Slightly increased interstitial markings which may represent viral illness versus small airway disease. Electronically Signed   By: Tish Frederickson M.D.   On: 12/07/2021 23:15     Assessment and Plan:   Chest pain CAD s/p CABGx3 - patient returns to the ED after discharge for recurrent chest tightness with minimally elevate trop with flat trend and mildly elevated BP, started on IV heparin - he is chest pain free - continue IV heparin - recent echo showed normal LVEF with no WMA - he is not a good candidate for Myoview lexiscan given obesity - continue Aspirin, Lipitor, Plavix, zetia, Imdur and Toprol  - medication management vs cardiac cath dicussed. He would like to pursue cardiac cath.  Risks and benefits of cardiac catheterization have been discussed with the patient.  These include bleeding, infection, kidney damage, stroke, heart attack, death.  The patient understands these risks and is willing to proceed.   HTN - BP mildlyl elevated - Imdur increased to  at discharge>continue on admission - continue Toprol  aily, Lisinopril  daily  HLD - LDL 41, continue statin and Zetia  OSA on CPAP -  continue CPAP  For questions or updates, please contact [facility] Please consult [email or web address] for contact info under    Signed, Jadalynn Burr Duvid Stall, PA-C  12/10/2021 8:03 AM

## 2021-12-10 NOTE — H&P (View-Only) (Signed)
Cardiology Consultation:   Patient ID: Craig Harvey MRN: [id number]; DOB: 04/16/1956  Admit date: 12/09/2021 Date of Consult: 12/10/2021  PCP:  Karamalegos, Alexander J, DO   [facility] Providers Cardiologist:  Timothy Gollan, MD   {   Patient Profile:   Craig Harvey is a 65 y.o. male with a hx of CAD s/p CABGx3, HTN, HLD, diabetes, OSA on CPAP, obesity who is being seen 12/10/2021 for the evaluation of chest pain at the request of Dr. Ayiku.  History of Present Illness:   Mr. Dazey had prior CABG in 2016  He was just recently discharged from the hospital 6/7. He was admitted for chest pain with minimally elevated troponin 31>30>32>31. BP was also mildly elevated. EKG with no acute changes. Echo showed normal LVEF with no WMA and G1DD. Imdur was increased and he was discharged with plan to discuss further options as outpatient   After discharge, he returned to the ER later that same day for recurrent chest pain. He left around 12 an around 5 he started having chest tightness. He took an Imdur 30mg with minimal improvement. When the pain didn't go away he came back to the ER. It is on the left side. No SOB. No LLE, orthopnea, pnd. Patient is wanting to discuss heart cath. He is currently chest pain free.   In the ER BP 148/100, pulse 68bpm, afebrile, RR 16, 9% O2. HS trop 28>28. NA 130, K 3.4, bicarb 19. CXR unremarkable. EKG with no ischemic changes. The patient was started on IV heparin and admitted.   Past Medical History:  Diagnosis Date   CAD (coronary artery disease)    a. 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 cardiac cath 01/09/2015: ostLAD 70:, mLAD 99%, ost to pLCx 95%, mLCx 80%, LVEF nl, recommend CABGl; c. s/p 3v CABG 01/14/15 LIMA-LAD, SVG-OM, SVG-Ramus   History of  echocardiogram    a. 01/2015 Echo: EF 60-65%, no rwma, nl RV fxn. Nl PASP.   Hyperlipidemia LDL goal <70    Hypertension    Morbid obesity (HCC)    OSA (obstructive sleep apnea)    Type II diabetes mellitus (HCC)     Past Surgical History:  Procedure Laterality Date   CARDIAC CATHETERIZATION N/A 01/09/2015   Procedure: Left Heart Cath and Coronary Angiography;  Surgeon: Timothy J Gollan, MD;  Location: [facility] INVASIVE CV LAB;  Service: Cardiovascular;  Laterality: N/A;   CORONARY ARTERY BYPASS GRAFT N/A 01/14/2015   Procedure: CORONARY ARTERY BYPASS GRAFTING (CABG), ON PUMP, TIMES THREE, USING LEFT INTERNAL MAMMARY ARTERY, RIGHT GREATER SAPHENOUS VEIN HARVESTED ENDOSCOPICALLY;  Surgeon: Peter Van Trigt, MD;  Location: [facility] OR;  Service: Open Heart Surgery;  Laterality: N/A;   HERNIA REPAIR     TEE WITHOUT CARDIOVERSION N/A 01/14/2015   Procedure: TRANSESOPHAGEAL ECHOCARDIOGRAM (TEE);  Surgeon: Peter Van Trigt, MD;  Location: [facility] OR;  Service: Open Heart Surgery;  Laterality: N/A;     Home Medications:  Prior to Admission medications   Medication Sig Start Date End Date Taking? Authorizing Provider  aspirin EC 81 MG tablet Take 1 tablet (81 mg total) by mouth daily. 11/18/17  Yes Gollan, Timothy J, MD  atorvastatin (LIPITOR) 80 MG tablet Take 1 tablet (80 mg total) by mouth daily. 08/31/21  Yes Gollan, Timothy J, MD  baclofen (LIORESAL)   10 MG tablet Take 0.5-1 tablets (5-10 mg total) by mouth 2 (two) times daily as needed for muscle spasms (leg pain). 07/22/20  Yes Karamalegos, Alexander J, DO  BD VEO INSULIN SYRINGE U/F 31G X 15/64" 0.3 ML MISC USE AS DIRECTED TWICE DAILY 10/21/21  Yes Karamalegos, Alexander J, DO  clopidogrel (PLAVIX) 75 MG tablet Take 1 tablet (75 mg total) by mouth daily. 07/23/21  Yes Karamalegos, Alexander J, DO  ezetimibe (ZETIA) 10 MG tablet Take 1 tablet (10 mg total) by mouth daily. 07/23/21  Yes Karamalegos, Alexander J, DO  glucose blood (ACCU-CHEK AVIVA PLUS) test strip Use  to check blood sugar up to 3 x daily 07/22/20  Yes Karamalegos, Alexander J, DO  hydrochlorothiazide (HYDRODIURIL) 25 MG tablet Take 1 tablet (25 mg total) by mouth daily. 07/23/21  Yes Karamalegos, Alexander J, DO  insulin aspart (NOVOLOG) 100 UNIT/ML injection Inject 0-20 Units into the skin 3 (three) times daily before meals.   Yes [provider]  insulin lispro protamine-lispro (HUMALOG MIX 75/25) (75-25) 100 UNIT/ML SUSP injection INJECT 17 UNITS SUBCUTANEOUSLY TWICE DAILY WITH A MEAL. MAX OF 50 UNITS DAILY. 07/23/21  Yes Karamalegos, Alexander J, DO  isosorbide mononitrate (IMDUR) 60 MG 24 hr tablet Take 1 tablet (60 mg total) by mouth daily. 12/09/21  Yes Ayiku, Bernard, MD  lisinopril (ZESTRIL) 10 MG tablet Take 1 tablet (10 mg total) by mouth daily. 07/23/21  Yes Karamalegos, Alexander J, DO  metFORMIN (GLUCOPHAGE) 1000 MG tablet TAKE 1 TABLET BY MOUTH TWICE DAILY WITH A MEAL 07/23/21  Yes Karamalegos, Alexander J, DO  metoprolol succinate (TOPROL-XL) 50 MG 24 hr tablet Take with or immediately following a meal. 07/23/21  Yes Karamalegos, Alexander J, DO  Syringe, Disposable, 3 ML MISC Use to inject humalog mix 75/25 17u BID 01/25/19  Yes Karamalegos, Alexander J, DO    Inpatient Medications: Scheduled Meds:  aspirin EC  81 mg Oral Daily   atorvastatin  80 mg Oral Daily   clopidogrel  75 mg Oral Daily   ezetimibe  10 mg Oral Daily   insulin aspart  0-20 Units Subcutaneous TID WC   insulin aspart  0-5 Units Subcutaneous QHS   isosorbide mononitrate  60 mg Oral Daily   lisinopril  10 mg Oral Daily   metoprolol succinate  50 mg Oral Daily   Continuous Infusions:  heparin 1,600 Units/hr (12/09/21 2237)   PRN Meds: acetaminophen, nitroGLYCERIN, ondansetron (ZOFRAN) IV  Allergies:   No Known Allergies  Social History:   Social History   Socioeconomic History   Marital status: Single    Spouse name: Not on file   Number of children: Not on file   Years of education: Not on  file   Highest education level: Not on file  Occupational History   Occupation: Former truck driver / fork lift operator  Tobacco Use   Smoking status: Former    Types: Cigarettes    Quit date: 08/06/1975    Years since quitting: 46.3   Smokeless tobacco: Never  Vaping Use   Vaping Use: Never used  Substance and Sexual Activity   Alcohol use: No   Drug use: No   Sexual activity: Not on file  Other Topics Concern   Not on file  Social History Narrative   Not on file   Social Determinants of Health   Financial Resource Strain: Low Risk  (05/20/2021)   Overall Financial Resource Strain (CARDIA)    Difficulty of Paying Living Expenses: Not hard   at all  Food Insecurity: No Food Insecurity (05/20/2021)   Hunger Vital Sign    Worried About Running Out of Food in the Last Year: Never true    Ran Out of Food in the Last Year: Never true  Transportation Needs: No Transportation Needs (05/20/2021)   PRAPARE - Transportation    Lack of Transportation (Medical): No    Lack of Transportation (Non-Medical): No  Physical Activity: Sufficiently Active (05/20/2021)   Exercise Vital Sign    Days of Exercise per Week: 4 days    Minutes of Exercise per Session: 40 min  Stress: No Stress Concern Present (05/20/2021)   [employer] - Occupational Stress Questionnaire    Feeling of Stress : Not at all  Social Connections: Socially Isolated (05/20/2021)   Social Connection and Isolation Panel [NHANES]    Frequency of Communication with Friends and Family: Three times a week    Frequency of Social Gatherings with Friends and Family: Three times a week    Attends Religious Services: Never    Active Member of Clubs or Organizations: No    Attends Club or Organization Meetings: Never    Marital Status: Never married  Intimate Partner Violence: Not on file    Family History:    Family History  Problem Relation Age of Onset   CAD Mother    CAD Brother    Throat  cancer Father    Diabetes Neg Hx    Cancer Neg Hx    Prostate cancer Neg Hx    Colon cancer Neg Hx      ROS:  Please see the history of present illness.   All other ROS reviewed and negative.     Physical Exam/Data:   Vitals:   12/10/21 0500 12/10/21 0530 12/10/21 0600 12/10/21 0630  BP: 128/70 136/79 (!) 140/93 (!) 156/102  Pulse: 80 68 77 80  Resp: 19 (!) 24 (!) 24 (!) 21  Temp:      TempSrc:      SpO2: 96% 97% 98% 93%  Weight:      Height:       No intake or output data in the 24 hours ending 12/10/21 0803    12/09/2021    6:54 PM 12/09/2021   12:56 AM 12/07/2021   10:17 PM  Last 3 Weights  Weight (lbs) 303 lb 2.1 oz 303 lb 3.2 oz 302 lb  Weight (kg) 137.5 kg 137.531 kg 136.986 kg     Body mass index is 46.09 kg/m.  General:  Well nourished, well developed, in no acute distress HEENT: normal Neck: no JVD Vascular: No carotid bruits; Distal pulses 2+ bilaterally Cardiac:  normal S1, S2; RRR; no murmur  Lungs:  clear to auscultation bilaterally, no wheezing, rhonchi or rales  Abd: soft, nontender, no hepatomegaly  Ext: no edema Musculoskeletal:  No deformities, BUE and BLE strength normal and equal Skin: warm and dry  Neuro:  CNs 2-12 intact, no focal abnormalities noted Psych:  Normal affect   EKG:  The EKG was personally reviewed and demonstrates:  NSR 1st degree AV block 69bpm, nonspecific T wave changes Telemetry:  Telemetry was personally reviewed and demonstrates:  NSR HR 70-80s  Relevant CV Studies:  Echo 12/2021  1. Left ventricular ejection fraction, by estimation, is 60 to 65%. The  left ventricle has normal function. The left ventricle has no regional  wall motion abnormalities. There is mild left ventricular hypertrophy.  Left ventricular diastolic parameters  are consistent   with Grade I diastolic dysfunction (impaired relaxation).   2. Right ventricular systolic function is normal. The right ventricular  size is normal.   3. The mitral valve is  grossly normal. No evidence of mitral valve  regurgitation.   4. The aortic valve was not well visualized. Aortic valve regurgitation  is not visualized.   5. Aortic dilatation noted. There is borderline dilatation of the aortic  root, measuring 39 mm.   6. The inferior vena cava is normal in size with greater than 50%  respiratory variability, suggesting right atrial pressure of 3 mmHg.   LHC (01/09/2015): Right dominant coronary arterial system Severe two-vessel disease: Ostial LAD estimated at 70-80%, eccentric calcification, LAD is a large vessel Also with critical proximal LAD lesion estimated at 95-99%. Proximal circumflex with severe 95% lesion after the takeoff of OM1, long, calcified, circumflex is a large vessel Mid circumflex also with 80% lesion, focal, prior to the takeoff of OM 2 Right coronary artery with mild diffuse disease   MPI (01/08/2015): HIGH RISK Test due to Poor Exercise Tolerance, Large-partially reversible perfusion defect in the likely LAD distribution.   TTE (01/06/2015): - Left ventricle: The cavity size was normal. Systolic function was    normal. The estimated ejection fraction was in the range of 60%    to 65%. Wall motion was normal; there were no regional wall    motion abnormalities. Left ventricular diastolic function    parameters were normal.  - Aortic valve: Valve area (Vmax): 4.8 cm^2.  - Left atrium: The atrium was normal in size.  - Right ventricle: Systolic function was normal.  - Pulmonary arteries: Systolic pressure was within the normal    range.   Laboratory Data:  High Sensitivity Troponin:   Recent Labs  Lab 12/08/21 0141 12/08/21 1243 12/08/21 1615 12/09/21 1856 12/09/21 2110  TROPONINIHS 30* 32* 31* 28* 28*     Chemistry Recent Labs  Lab 12/07/21 2328 12/08/21 1243 12/09/21 1856  NA 129* 132* 130*  K 3.9 4.1 3.4*  CL 98 100 98  CO2 22 24 19*  GLUCOSE 100* 117* 138*  BUN 16 11 12  CREATININE 0.82 0.76 0.90  CALCIUM  9.3 9.2 9.2  GFRNONAA >60 >60 >60  ANIONGAP 9 8 13    Recent Labs  Lab 12/07/21 2328  PROT 7.5  ALBUMIN 4.1  AST 36  ALT 43  ALKPHOS 43  BILITOT 0.6   Lipids  Recent Labs  Lab 12/08/21 1243  CHOL 88  TRIG 63  HDL 34*  LDLCALC 41  CHOLHDL 2.6    Hematology Recent Labs  Lab 12/09/21 0627 12/09/21 1856 12/10/21 0320  WBC 4.6 4.4 4.9  RBC 4.28 4.85 4.50  HGB 11.8* 13.2 12.3*  HCT 36.0* 40.7 37.5*  MCV 84.1 83.9 83.3  MCH 27.6 27.2 27.3  MCHC 32.8 32.4 32.8  RDW 14.0 14.1 14.1  PLT 169 202 169   Thyroid  Recent Labs  Lab 12/08/21 1243  TSH 0.888    BNPNo results for input(s): "BNP", "PROBNP" in the last 168 hours.  DDimer No results for input(s): "DDIMER" in the last 168 hours.   Radiology/Studies:  DG Chest 2 View  Result Date: 12/09/2021 CLINICAL DATA:  Chest pain. EXAM: CHEST - 2 VIEW COMPARISON:  12/07/2021 FINDINGS: The heart size and mediastinal contours are within normal limits. Prior CABG. There is no evidence of pulmonary edema, consolidation, pneumothorax, nodule or pleural fluid. The visualized skeletal structures are unremarkable. IMPRESSION: No active cardiopulmonary   disease. Electronically Signed   By: Glenn  Yamagata M.D.   On: 12/09/2021 19:14   ECHOCARDIOGRAM COMPLETE  Result Date: 12/09/2021    ECHOCARDIOGRAM REPORT   Patient Name:   Chelsey L Wiginton Date of Exam: 12/08/2021 Medical Rec #:  [id number]       Height:       68.0 in Accession #:    [id number]      Weight:       302.0 lb Date of Birth:  03/27/1956      BSA:          2.435 m Patient Age:    65 years        BP:           100/60 mmHg Patient Gender: M               HR:           85 bpm. Exam Location:  [facility] Procedure: 2D Echo, Cardiac Doppler and Color Doppler Indications:     I21.4 NSTEMI  History:         Patient has prior history of Echocardiogram examinations, most                  recent 01/14/2015. CAD; Risk Factors:Sleep Apnea, Diabetes,                  Hypertension and Dyslipidemia.   Sonographer:     NaTashia Rodgers-Jones RDCS Referring Phys:  [id number] ALEXIS HUGELMEYER Diagnosing Phys: Brian Agbor-Etang MD IMPRESSIONS  1. Left ventricular ejection fraction, by estimation, is 60 to 65%. The left ventricle has normal function. The left ventricle has no regional wall motion abnormalities. There is mild left ventricular hypertrophy. Left ventricular diastolic parameters are consistent with Grade I diastolic dysfunction (impaired relaxation).  2. Right ventricular systolic function is normal. The right ventricular size is normal.  3. The mitral valve is grossly normal. No evidence of mitral valve regurgitation.  4. The aortic valve was not well visualized. Aortic valve regurgitation is not visualized.  5. Aortic dilatation noted. There is borderline dilatation of the aortic root, measuring 39 mm.  6. The inferior vena cava is normal in size with greater than 50% respiratory variability, suggesting right atrial pressure of 3 mmHg. FINDINGS  Left Ventricle: Left ventricular ejection fraction, by estimation, is 60 to 65%. The left ventricle has normal function. The left ventricle has no regional wall motion abnormalities. The left ventricular internal cavity size was normal in size. There is  mild left ventricular hypertrophy. Left ventricular diastolic parameters are consistent with Grade I diastolic dysfunction (impaired relaxation). Right Ventricle: The right ventricular size is normal. No increase in right ventricular wall thickness. Right ventricular systolic function is normal. Left Atrium: Left atrial size was normal in size. Right Atrium: Right atrial size was normal in size. Pericardium: There is no evidence of pericardial effusion. Mitral Valve: The mitral valve is grossly normal. No evidence of mitral valve regurgitation. Tricuspid Valve: The tricuspid valve is grossly normal. Tricuspid valve regurgitation is not demonstrated. Aortic Valve: The aortic valve was not well visualized. Aortic  valve regurgitation is not visualized. Pulmonic Valve: The pulmonic valve was not well visualized. Pulmonic valve regurgitation is not visualized. Aorta: Aortic dilatation noted. There is borderline dilatation of the aortic root, measuring 39 mm. Venous: The inferior vena cava is normal in size with greater than 50% respiratory variability, suggesting right atrial pressure of 3 mmHg. IAS/Shunts: No atrial level shunt detected by   color flow Doppler.  LEFT VENTRICLE PLAX 2D LVIDd:         4.86 cm   Diastology LVIDs:         2.92 cm   LV e' medial:    6.42 cm/s LV PW:         1.08 cm   LV E/e' medial:  8.7 LV IVS:        1.12 cm   LV e' lateral:   11.10 cm/s LVOT diam:     2.50 cm   LV E/e' lateral: 5.0 LV SV:         61 LV SV Index:   25 LVOT Area:     4.91 cm  RIGHT VENTRICLE            IVC RV Basal diam:  3.94 cm    IVC diam: 1.53 cm RV S prime:     6.64 cm/s LEFT ATRIUM             Index        RIGHT ATRIUM           Index LA diam:        3.80 cm 1.56 cm/m   RA Area:     12.60 cm LA Vol (A2C):   46.2 ml 18.97 ml/m  RA Volume:   30.50 ml  12.53 ml/m LA Vol (A4C):   35.8 ml 14.70 ml/m LA Biplane Vol: 42.9 ml 17.62 ml/m  AORTIC VALVE LVOT Vmax:   90.80 cm/s LVOT Vmean:  67.400 cm/s LVOT VTI:    0.125 m  AORTA Ao Root diam: 3.90 cm Ao Asc diam:  3.60 cm MITRAL VALVE MV Area (PHT): 3.60 cm    SHUNTS MV Decel Time: 211 msec    Systemic VTI:  0.12 m MV E velocity: 55.70 cm/s  Systemic Diam: 2.50 cm MV A velocity: 73.30 cm/s MV E/A ratio:  0.76 Brian Agbor-Etang MD Electronically signed by Brian Agbor-Etang MD Signature Date/Time: 12/09/2021/5:38:29 PM    Final    DG Chest 2 View  Result Date: 12/07/2021 CLINICAL DATA:  chest tightness EXAM: CHEST - 2 VIEW COMPARISON:  Chest x-ray 02/04/2015 FINDINGS: The heart and mediastinal contours are within normal limits. No focal consolidation. Slightly increased interstitial markings. No pleural effusion. No pneumothorax. No acute osseous abnormality.  Intact sternotomy  wires. IMPRESSION: Slightly increased interstitial markings which may represent viral illness versus small airway disease. Electronically Signed   By: Morgane  Naveau M.D.   On: 12/07/2021 23:15     Assessment and Plan:   Chest pain CAD s/p CABGx3 - patient returns to the ED after discharge for recurrent chest tightness with minimally elevate trop with flat trend and mildly elevated BP, started on IV heparin - he is chest pain free - continue IV heparin - recent echo showed normal LVEF with no WMA - he is not a good candidate for Myoview lexiscan given obesity - continue Aspirin, Lipitor, Plavix, zetia, Imdur and Toprol  - medication management vs cardiac cath dicussed. He would like to pursue cardiac cath.  Risks and benefits of cardiac catheterization have been discussed with the patient.  These include bleeding, infection, kidney damage, stroke, heart attack, death.  The patient understands these risks and is willing to proceed.   HTN - BP mildlyl elevated - Imdur increased to 60mg at discharge>continue on admission - continue Toprol 50mg aily, Lisinopril 10mg daily  HLD - LDL 41, continue statin and Zetia  OSA on CPAP -   continue CPAP  For questions or updates, please contact [facility] Please consult [email or web address] for contact info under    Signed, Jeananne Bedwell H Dolce Sylvia, PA-C  12/10/2021 8:03 AM  

## 2021-12-10 NOTE — Progress Notes (Incomplete)
ANTICOAGULATION CONSULT NOTE  Pharmacy Consult for heparin infusion initiation and monitoring Indication: chest pain/ACS  No Known Allergies  Patient Measurements: Height: 5\' 8"  (172.7 cm) Weight: (!) 137.5 kg (303 lb 2.1 oz) IBW/kg (Calculated) : 68.4 Heparin Dosing Weight: 101.1 kg  Vital Signs: Temp: 98.1 F (36.7 C) (06/08 0800) Temp Source: Oral (06/08 0800) BP: 162/90 (06/08 1109) Pulse Rate: 77 (06/08 1109)  Labs: Recent Labs    12/07/21 2328 12/08/21 0141 12/08/21 0825 12/08/21 1243 12/08/21 1353 12/08/21 1615 12/08/21 2025 12/09/21 0627 12/09/21 1856 12/09/21 2110 12/10/21 0320 12/10/21 1029  HGB 12.5*  --    < > 12.8*  --   --   --  11.8* 13.2  --  12.3*  --   HCT 38.2*  --    < > 39.7  --   --   --  36.0* 40.7  --  37.5*  --   PLT 176  --    < > 164  --   --   --  169 202  --  169  --   APTT  --  32  --   --   --   --   --   --   --   --   --   --   LABPROT  --  15.2  --   --   --   --   --   --   --   --   --   --   INR  --  1.2  --   --   --   --   --   --   --   --   --   --   HEPARINUNFRC  --   --    < >  --    < >  --    < > 0.77*  --   --  0.58 0.65  CREATININE 0.82  --   --  0.76  --   --   --   --  0.90  --   --   --   TROPONINIHS 31* 30*  --  32*  --  31*  --   --  28* 28*  --   --    < > = values in this interval not displayed.     Estimated Creatinine Clearance: 114.1 mL/min (by C-G formula based on SCr of 0.9 mg/dL).   Medical History: Past Medical History:  Diagnosis Date   CAD (coronary artery disease)    a. 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 cardiac cath 01/09/2015: ostLAD 70:, mLAD 99%, ost to pLCx 95%, mLCx 80%, LVEF nl, recommend CABGl; c. s/p 3v CABG 01/14/15 LIMA-LAD, SVG-OM, SVG-Ramus   History of echocardiogram    a. 01/2015 Echo:  EF 60-65%, no rwma, nl RV fxn. Nl PASP.   Hyperlipidemia LDL goal <70    Hypertension    Morbid obesity (HCC)    OSA (obstructive sleep apnea)    Type II diabetes mellitus [employer]     Assessment: 66 year old man with medical history significant for CAD s/p CABG, hypertension, hyperlipidemia, morbid obesity, type II DM, who presented to the hospital because of chest pain/chest tightness. A  review of dispense records reveals no DOAC or vitamin K antagonist.  12/10/21 HL @  1029 = 0.65, therapeutic x 2  Goal of Therapy:  Heparin level 0.3-0.7 units/ml Monitor platelets by anticoagulation protocol: Yes   Plan:  6/8:  HL @ 1029 = 0.65, therapeutic x 2 Will continue this patient on current rate of 1600 units/hr, and recheck HL on 6/9 with AM labs.   Myasia Sinatra Al-Daghir 12/10/2021,11:21 AM

## 2021-12-10 NOTE — ED Notes (Signed)
Heparin paused until labs result d/t 9am draw not being discovered by lab. Sent new tube down

## 2021-12-11 ENCOUNTER — Encounter: Payer: Self-pay | Admitting: Cardiology

## 2021-12-11 DIAGNOSIS — Z951 Presence of aortocoronary bypass graft: Secondary | ICD-10-CM | POA: Diagnosis not present

## 2021-12-11 DIAGNOSIS — Z955 Presence of coronary angioplasty implant and graft: Secondary | ICD-10-CM | POA: Diagnosis not present

## 2021-12-11 DIAGNOSIS — Z79899 Other long term (current) drug therapy: Secondary | ICD-10-CM | POA: Diagnosis not present

## 2021-12-11 DIAGNOSIS — R079 Chest pain, unspecified: Secondary | ICD-10-CM | POA: Diagnosis present

## 2021-12-11 DIAGNOSIS — Z6841 Body Mass Index (BMI) 40.0 and over, adult: Secondary | ICD-10-CM | POA: Diagnosis not present

## 2021-12-11 DIAGNOSIS — E1151 Type 2 diabetes mellitus with diabetic peripheral angiopathy without gangrene: Secondary | ICD-10-CM | POA: Diagnosis present

## 2021-12-11 DIAGNOSIS — I10 Essential (primary) hypertension: Secondary | ICD-10-CM | POA: Diagnosis present

## 2021-12-11 DIAGNOSIS — I25110 Atherosclerotic heart disease of native coronary artery with unstable angina pectoris: Secondary | ICD-10-CM | POA: Diagnosis present

## 2021-12-11 DIAGNOSIS — Z7984 Long term (current) use of oral hypoglycemic drugs: Secondary | ICD-10-CM | POA: Diagnosis not present

## 2021-12-11 DIAGNOSIS — I200 Unstable angina: Secondary | ICD-10-CM | POA: Diagnosis not present

## 2021-12-11 DIAGNOSIS — Z794 Long term (current) use of insulin: Secondary | ICD-10-CM | POA: Diagnosis not present

## 2021-12-11 DIAGNOSIS — Z7982 Long term (current) use of aspirin: Secondary | ICD-10-CM | POA: Diagnosis not present

## 2021-12-11 DIAGNOSIS — Z7902 Long term (current) use of antithrombotics/antiplatelets: Secondary | ICD-10-CM | POA: Diagnosis not present

## 2021-12-11 DIAGNOSIS — Z87891 Personal history of nicotine dependence: Secondary | ICD-10-CM | POA: Diagnosis not present

## 2021-12-11 DIAGNOSIS — R001 Bradycardia, unspecified: Secondary | ICD-10-CM | POA: Diagnosis not present

## 2021-12-11 DIAGNOSIS — E785 Hyperlipidemia, unspecified: Secondary | ICD-10-CM | POA: Diagnosis present

## 2021-12-11 DIAGNOSIS — Z8249 Family history of ischemic heart disease and other diseases of the circulatory system: Secondary | ICD-10-CM | POA: Diagnosis not present

## 2021-12-11 DIAGNOSIS — E876 Hypokalemia: Secondary | ICD-10-CM | POA: Diagnosis present

## 2021-12-11 DIAGNOSIS — Z808 Family history of malignant neoplasm of other organs or systems: Secondary | ICD-10-CM | POA: Diagnosis not present

## 2021-12-11 DIAGNOSIS — G4733 Obstructive sleep apnea (adult) (pediatric): Secondary | ICD-10-CM | POA: Diagnosis present

## 2021-12-11 LAB — BASIC METABOLIC PANEL
Anion gap: 4 — ABNORMAL LOW (ref 5–15)
BUN: 10 mg/dL (ref 8–23)
CO2: 23 mmol/L (ref 22–32)
Calcium: 8.5 mg/dL — ABNORMAL LOW (ref 8.9–10.3)
Chloride: 108 mmol/L (ref 98–111)
Creatinine, Ser: 0.66 mg/dL (ref 0.61–1.24)
GFR, Estimated: >60 mL/min (ref >60)
Glucose, Bld: 112 mg/dL — ABNORMAL HIGH (ref 70–99)
Potassium: 3.7 mmol/L (ref 3.5–5.1)
Sodium: 135 mmol/L (ref 135–145)

## 2021-12-11 LAB — CBC
HCT: 36.3 % — ABNORMAL LOW (ref 39.0–52.0)
Hemoglobin: 11.8 g/dL — ABNORMAL LOW (ref 13.0–17.0)
MCH: 27.2 pg (ref 26.0–34.0)
MCHC: 32.5 g/dL (ref 30.0–36.0)
MCV: 83.6 fL (ref 80.0–100.0)
Platelets: 148 10*3/uL — ABNORMAL LOW (ref 150–400)
RBC: 4.34 MIL/uL (ref 4.22–5.81)
RDW: 14.3 % (ref 11.5–15.5)
WBC: 3.8 10*3/uL — ABNORMAL LOW (ref 4.0–10.5)
nRBC: 0 % (ref 0.0–0.2)

## 2021-12-11 LAB — GLUCOSE, CAPILLARY
Glucose-Capillary: 105 mg/dL — ABNORMAL HIGH (ref 70–99)
Glucose-Capillary: 154 mg/dL — ABNORMAL HIGH (ref 70–99)

## 2021-12-11 LAB — LIPOPROTEIN A (LPA): Lipoprotein (a): 236.4 nmol/L — ABNORMAL HIGH (ref <75.0)

## 2021-12-11 NOTE — Discharge Summary (Signed)
Physician Discharge Summary   Patient: Craig Harvey MRN: [id number] DOB: 08/11/1955  Admit date:     12/09/2021  Discharge date: 12/11/21  Discharge Physician: Jennye Boroughs   PCP: [name], DO   Recommendations at discharge:   Follow-up with PCP in 1 week Follow-up with cardiologist as scheduled (office will call for appointment)  Discharge Diagnoses: Principal Problem:   Unstable angina ([place]) Active Problems:   CAD s/p CABG x 3   DM (diabetes mellitus), type 2 with peripheral vascular complications ([place])   OSA on CPAP   Obesity, Class III, BMI 40-49.9 (morbid obesity) ([place])   Hypertension  Resolved Problems:   * No resolved hospital problems. *  Hospital Course:  Craig Harvey is a 66 y.o. male with medical history significant for CAD s/p CABG, hypertension, hyperlipidemia, morbid obesity, type II DM.  He was just discharged from the hospital on 12/09/2021 after hospitalization on 12/07/2021 for unstable angina.  He was treated with IV heparin and Imdur was increased from 30 to 60 mg daily.  Because of improvement in his symptoms, outpatient follow-up for stress test was recommended.  He presented to the hospital again, on the same day of discharge, with chest pain.   He was admitted to the hospital for unstable angina.  He was treated with IV heparin drip.  He underwent PCI and drug-eluting stent was placed to right coronary artery.  His symptoms have resolved and he is still stable for discharge to home today.       Consultants: Cardiologist Procedures performed: Left heart cath Disposition: Home Diet recommendation:  Discharge Diet Orders (From admission, onward)     Start     Ordered   12/11/21 0000  Diet - low sodium heart healthy        12/11/21 1401   12/11/21 0000  Diet Carb Modified        12/11/21 1401           Cardiac diet DISCHARGE MEDICATION: Allergies as of 12/11/2021       Reactions   Trulicity [dulaglutide] Other (See  Comments)   Headaches         Medication List     STOP taking these medications    insulin aspart 100 UNIT/ML injection Commonly known as: novoLOG       TAKE these medications    Accu-Chek Aviva Plus test strip Generic drug: glucose blood Use to check blood sugar up to 3 x daily   aspirin EC 81 MG tablet Take 1 tablet (81 mg total) by mouth daily.   atorvastatin 80 MG tablet Commonly known as: LIPITOR Take 1 tablet (80 mg total) by mouth daily.   baclofen 10 MG tablet Commonly known as: LIORESAL Take 0.5-1 tablets (5-10 mg total) by mouth 2 (two) times daily as needed for muscle spasms (leg pain).   BD Veo Insulin Syringe U/F 31G X 15/64" 0.3 ML Misc Generic drug: Insulin Syringe-Needle U-100 USE AS DIRECTED TWICE DAILY   clopidogrel 75 MG tablet Commonly known as: PLAVIX Take 1 tablet (75 mg total) by mouth daily.   ezetimibe 10 MG tablet Commonly known as: ZETIA Take 1 tablet (10 mg total) by mouth daily.   HumaLOG Mix 75/25 (75-25) 100 UNIT/ML Susp injection Generic drug: insulin lispro protamine-lispro INJECT 17 UNITS SUBCUTANEOUSLY TWICE DAILY WITH A MEAL. MAX OF 50 UNITS DAILY.   hydrochlorothiazide 25 MG tablet Commonly known as: HYDRODIURIL Take 1 tablet (25 mg total) by mouth daily.  isosorbide mononitrate 60 MG 24 hr tablet Commonly known as: IMDUR Take 1 tablet (60 mg total) by mouth daily.   lisinopril 10 MG tablet Commonly known as: ZESTRIL Take 1 tablet (10 mg total) by mouth daily.   metFORMIN 1000 MG tablet Commonly known as: GLUCOPHAGE TAKE 1 TABLET BY MOUTH TWICE DAILY WITH A MEAL   metoprolol succinate 50 MG 24 hr tablet Commonly known as: TOPROL-XL Take with or immediately following a meal.   Syringe (Disposable) 3 ML Misc Use to inject humalog mix 75/25 17u BID        Discharge Exam: Filed Weights   12/09/21 1854  Weight: (!) 137.5 kg   GEN: NAD SKIN: No rash EYES: EOMI ENT: MMM CV: RRR PULM: CTA B ABD:  soft, obese, NT, +BS CNS: AAO x 3, non focal EXT: No edema or tenderness   Condition at discharge: good  The results of significant diagnostics from this hospitalization (including imaging, microbiology, ancillary and laboratory) are listed below for reference.   Imaging Studies: CARDIAC CATHETERIZATION  Result Date: 12/10/2021   -----------------LEFT CORONARY---------------------------   Mid LM to Dist LM lesion is 15% stenosed with 25% stenosed side branch in [place].   Ost LAD to Prox LAD lesion is 70% stenosed.  Prox LAD to Mid LAD lesion is 100% stenosed.   Ost Cx to Prox Cx lesion is 100% stenosed.   Prox Cx to Mid Cx lesion is 80% stenosed.   -----------------GRAFTS---------------------------   LIMA graft was visualized by angiography and is large.  The graft exhibits no disease.  There is no competitive flow   SVG-OM graft was visualized by angiography. The graft exhibits no disease.   SVG-RI gaft was visulalized by angiograph => Origin to Prox Graft lesion is 100% stenosed.   ------------------------------------------------------   -----------------RIGHT CORONARY---------------------------   CULPRIT LESION: Prox RCA lesion is 95% stenosed.   Balloon angioplasty was performed using sequential BALLN SCOREFLEX Balloons 3.0 x 10 & 3.50X10.   A drug-eluting stent was successfully placed using a STENT ONYX FRONTIER 3.5X15 -> postdilated to 4.1 mm   Post intervention, there is a 0% residual stenosis.   ------------------------------------------------------   The left ventricular systolic function is normal.   LV end diastolic pressure is normal. SUMMARY Severe Native CAD: 100% CTO of proximal LCx after OM1/RI, 90% followed 100% CTO of LAD after SP1; Widely patent ostial RI CULPRIT LESION 95% focal (napkin ring) proximal RCA Successful scoring balloon (sequential 3.0 mm & 3.5 mm ScoreFlex) angioplasty followed by => DES PCI of proximal RCA Onyx Frontier DES 3.5 mm x 15 mm postdilated to 4.1 mm. 95% reduced  to 0% stenosis; TIMI-3 flow pre and post.  Distal LAD has tandem 50% lesions, distal LCx with retrograde flow reveals 80% stenosis prior to anastomosis. 2 of 3 grafts patent: SVG-OM, LIMA-LAD with flush occlusion of SVG-RI NORMAL LVEDP.  LV gram not performed-Echo done today.  PLAN OF CARE:  Monitor overnight post PCI, continue aggressive cardiovascular risk factor modification with guideline medical therapy.  Anticipate morning discharge.  Phase 2 cardiac rehab ordered Craig Harvey   DG Chest 2 View  Result Date: 12/09/2021 CLINICAL DATA:  Chest pain. EXAM: CHEST - 2 VIEW COMPARISON:  12/07/2021 FINDINGS: The heart size and mediastinal contours are within normal limits. Prior CABG. There is no evidence of pulmonary edema, consolidation, pneumothorax, nodule or pleural fluid. The visualized skeletal structures are unremarkable. IMPRESSION: No active cardiopulmonary disease. Electronically Signed   By: Jenness Corner.D.  On: 12/09/2021 19:14   ECHOCARDIOGRAM COMPLETE  Result Date: 12/09/2021    ECHOCARDIOGRAM REPORT   Patient Name:   Craig Harvey Date of Exam: 12/08/2021 Medical Rec #:  [id number]       Height:       68.0 in Accession #:    [id number]      Weight:       302.0 lb Date of Birth:  [birth year]      BSA:          2.435 m Patient Age:    76 years        BP:           100/60 mmHg Patient Gender: M               HR:           85 bpm. Exam Location:  [facility] Procedure: 2D Echo, Cardiac Doppler and Color Doppler Indications:     I21.4 NSTEMI  History:         Patient has prior history of Echocardiogram examinations, most                  recent 01/14/2015. CAD; Risk Factors:Sleep Apnea, Diabetes,                  Hypertension and Dyslipidemia.  Sonographer:     Cresenciano Lick RDCS Referring Phys:  [id number] [place] Diagnosing Phys: Kate Sable MD IMPRESSIONS  1. Left ventricular ejection fraction, by estimation, is 60 to 65%. The left ventricle has normal function. The left  ventricle has no regional wall motion abnormalities. There is mild left ventricular hypertrophy. Left ventricular diastolic parameters are consistent with Grade I diastolic dysfunction (impaired relaxation).  2. Right ventricular systolic function is normal. The right ventricular size is normal.  3. The mitral valve is grossly normal. No evidence of mitral valve regurgitation.  4. The aortic valve was not well visualized. Aortic valve regurgitation is not visualized.  5. Aortic dilatation noted. There is borderline dilatation of the aortic root, measuring 39 mm.  6. The inferior vena cava is normal in size with greater than 50% respiratory variability, suggesting right atrial pressure of 3 mmHg. FINDINGS  Left Ventricle: Left ventricular ejection fraction, by estimation, is 60 to 65%. The left ventricle has normal function. The left ventricle has no regional wall motion abnormalities. The left ventricular internal cavity size was normal in size. There is  mild left ventricular hypertrophy. Left ventricular diastolic parameters are consistent with Grade I diastolic dysfunction (impaired relaxation). Right Ventricle: The right ventricular size is normal. No increase in right ventricular wall thickness. Right ventricular systolic function is normal. Left Atrium: Left atrial size was normal in size. Right Atrium: Right atrial size was normal in size. Pericardium: There is no evidence of pericardial effusion. Mitral Valve: The mitral valve is grossly normal. No evidence of mitral valve regurgitation. Tricuspid Valve: The tricuspid valve is grossly normal. Tricuspid valve regurgitation is not demonstrated. Aortic Valve: The aortic valve was not well visualized. Aortic valve regurgitation is not visualized. Pulmonic Valve: The pulmonic valve was not well visualized. Pulmonic valve regurgitation is not visualized. Aorta: Aortic dilatation noted. There is borderline dilatation of the aortic root, measuring 39 mm. Venous: The  inferior vena cava is normal in size with greater than 50% respiratory variability, suggesting right atrial pressure of 3 mmHg. IAS/Shunts: No atrial level shunt detected by color flow Doppler.  LEFT VENTRICLE PLAX 2D LVIDd:  4.86 cm   Diastology LVIDs:         2.92 cm   LV e' medial:    6.42 cm/s LV PW:         1.08 cm   LV E/e' medial:  8.7 LV IVS:        1.12 cm   LV e' lateral:   11.10 cm/s LVOT diam:     2.50 cm   LV E/e' lateral: 5.0 LV SV:         61 LV SV Index:   25 LVOT Area:     4.91 cm  RIGHT VENTRICLE            IVC RV Basal diam:  3.94 cm    IVC diam: 1.53 cm RV S prime:     6.64 cm/s LEFT ATRIUM             Index        RIGHT ATRIUM           Index LA diam:        3.80 cm 1.56 cm/m   RA Area:     12.60 cm LA Vol (A2C):   46.2 ml 18.97 ml/m  RA Volume:   30.50 ml  12.53 ml/m LA Vol (A4C):   35.8 ml 14.70 ml/m LA Biplane Vol: 42.9 ml 17.62 ml/m  AORTIC VALVE LVOT Vmax:   90.80 cm/s LVOT Vmean:  67.400 cm/s LVOT VTI:    0.125 m  AORTA Ao Root diam: 3.90 cm Ao Asc diam:  3.60 cm MITRAL VALVE MV Area (PHT): 3.60 cm    SHUNTS MV Decel Time: 211 msec    Systemic VTI:  0.12 m MV E velocity: 55.70 cm/s  Systemic Diam: 2.50 cm MV A velocity: 73.30 cm/s MV E/A ratio:  0.76 Kate Sable MD Electronically signed by Kate Sable MD Signature Date/Time: 12/09/2021/5:38:29 PM    Final    DG Chest 2 View  Result Date: 12/07/2021 CLINICAL DATA:  chest tightness EXAM: CHEST - 2 VIEW COMPARISON:  Chest x-ray 02/04/2015 FINDINGS: The heart and mediastinal contours are within normal limits. No focal consolidation. Slightly increased interstitial markings. No pleural effusion. No pneumothorax. No acute osseous abnormality.  Intact sternotomy wires. IMPRESSION: Slightly increased interstitial markings which may represent viral illness versus small airway disease. Electronically Signed   By: Iven Finn M.D.   On: 12/07/2021 23:15    Microbiology: Results for orders placed or performed during  the hospital encounter of 01/09/15  MRSA PCR Screening     Status: None   Collection Time: 01/09/15  3:25 PM   Specimen: Nasopharyngeal  Result Value Ref Range Status   MRSA by PCR NEGATIVE NEGATIVE Final    Comment:        The GeneXpert MRSA Assay (FDA approved for NASAL specimens only), is one component of a comprehensive MRSA colonization surveillance program. It is not intended to diagnose MRSA infection nor to guide or monitor treatment for MRSA infections.   Surgical pcr screen     Status: None   Collection Time: 01/11/15  5:47 PM   Specimen: Nasal Mucosa; Nasal Swab  Result Value Ref Range Status   MRSA, PCR NEGATIVE NEGATIVE Final   Staphylococcus aureus NEGATIVE NEGATIVE Final    Comment:        The Xpert SA Assay (FDA approved for NASAL specimens in patients over 84 years of age), is one component of a comprehensive surveillance program.  Test performance has been  validated by [facility] for patients greater than or equal to 31 year old. It is not intended to diagnose infection nor to guide or monitor treatment.     Labs: CBC: Recent Labs  Lab 12/07/21 2328 12/08/21 0825 12/08/21 1243 12/09/21 0627 12/09/21 1856 12/10/21 0320 12/11/21 0456  WBC 5.2   < > 4.2 4.6 4.4 4.9 3.8*  NEUTROABS 3.5  --   --   --   --   --   --   HGB 12.5*   < > 12.8* 11.8* 13.2 12.3* 11.8*  HCT 38.2*   < > 39.7 36.0* 40.7 37.5* 36.3*  MCV 83.6   < > 84.6 84.1 83.9 83.3 83.6  PLT 176   < > 164 169 202 169 148*   < > = values in this interval not displayed.   Basic Metabolic Panel: Recent Labs  Lab 12/07/21 2328 12/08/21 1243 12/09/21 1856 12/10/21 2124 12/11/21 0456  NA 129* 132* 130*  --  135  K 3.9 4.1 3.4*  --  3.7  CL 98 100 98  --  108  CO2 22 24 19*  --  23  GLUCOSE 100* 117* 138*  --  112*  BUN 16 11 12   --  10  CREATININE 0.82 0.76 0.90 0.76 0.66  CALCIUM 9.3 9.2 9.2  --  8.5*   Liver Function Tests: Recent Labs  Lab 12/07/21 2328  AST 36  ALT 43   ALKPHOS 43  BILITOT 0.6  PROT 7.5  ALBUMIN 4.1   CBG: Recent Labs  Lab 12/10/21 1142 12/10/21 1712 12/10/21 2021 12/11/21 0726 12/11/21 1205  GLUCAP 150* 164* 133* 105* 154*    Discharge time spent: greater than 30 minutes.  Signed: Jennye Boroughs, MD [facility] 12/11/2021

## 2021-12-11 NOTE — Progress Notes (Signed)
Late entry: Communicated with patient concerning CPAP order. Patient states he wears his home unit sometimes. Asked patient did he wear a full face mask, patient stated he wears nasal prongs. He stated he would try hospital unit. RN is at the bedside during this. I asked RN to call when patient was ready. No call received from nurse.

## 2021-12-11 NOTE — Progress Notes (Signed)
Novolog insulin per sliding scale still on patient's med list when giving discharge instructions.  It was marked through because patient states he has not used that since 2016.  The AVS stated there were no medication changes and patient instructed to continue taking his medications as previously prescribed

## 2021-12-11 NOTE — Discharge Summary (Incomplete)
Physician Discharge Summary   Patient: Craig Harvey MRN: [id number] DOB: 30-May-1956  Admit date:     12/09/2021  Discharge date: {dischdate:26783}  Discharge Physician: Lurene ShadowBERNARD Judd Mccubbin   PCP: Smitty CordsKaramalegos, Alexander J, DO   Recommendations at discharge:  {Tip this will not be part of the note when signed- Example include specific recommendations for outpatient follow-up, pending tests to follow-up on. (Optional):26781}  ***  Discharge Diagnoses: Principal Problem:   Unstable angina (HCC) Active Problems:   CAD s/p CABG x 3   DM (diabetes mellitus), type 2 with peripheral vascular complications (HCC)   OSA on CPAP   Obesity, Class III, BMI 40-49.9 (morbid obesity) (HCC)   Hypertension  Resolved Problems:   * No resolved hospital problems. [facility]*  Hospital Course: No notes on file  Assessment and Plan: * Unstable angina (HCC) CAD s/p CABG x3 Continue heparin infusion started in the ED Continue aspirin, atorvastatin, isosorbide, metoprolol and lisinopril Keep n.p.o. for possible cardiac cath in the a.m. Cardiology consulted Prior cardiology notes and recommendations reviewed  Hypertension Continue lisinopril and metoprolol  Obesity, Class III, BMI 40-49.9 (morbid obesity) (HCC) Complicating factor to overall prognosis and care Counseled on lifestyle modification  OSA on CPAP Continue CPAP  DM (diabetes mellitus), type 2 with peripheral vascular complications (HCC) Insulin-dependent type 2 diabetes, controlled A1c 6.4 on 6/7 Sliding scale insulin and basal insulin       {Tip this will not be part of the note when signed Body mass index is 46.09 kg/m. , ,  (Optional):26781}  {(NOTE) Pain control PDMP Statment (Optional):26782} Consultants: *** Procedures performed: ***  Disposition: {Plan; Disposition:26390} Diet recommendation:  Discharge Diet Orders (From admission, onward)     Start     Ordered   12/11/21 0000  Diet - low sodium heart healthy         12/11/21 1401   12/11/21 0000  Diet Carb Modified        12/11/21 1401           {Diet_Plan:26776} DISCHARGE MEDICATION: Allergies as of 12/11/2021       Reactions   Trulicity [dulaglutide] Other (See Comments)   Headaches         Medication List     STOP taking these medications    insulin aspart 100 UNIT/ML injection Commonly known as: novoLOG       TAKE these medications    Accu-Chek Aviva Plus test strip Generic drug: glucose blood Use to check blood sugar up to 3 x daily   aspirin EC 81 MG tablet Take 1 tablet (81 mg total) by mouth daily.   atorvastatin 80 MG tablet Commonly known as: LIPITOR Take 1 tablet (80 mg total) by mouth daily.   baclofen 10 MG tablet Commonly known as: LIORESAL Take 0.5-1 tablets (5-10 mg total) by mouth 2 (two) times daily as needed for muscle spasms (leg pain).   BD Veo Insulin Syringe U/F 31G X 15/64" 0.3 ML Misc Generic drug: Insulin Syringe-Needle U-100 USE AS DIRECTED TWICE DAILY   clopidogrel 75 MG tablet Commonly known as: PLAVIX Take 1 tablet (75 mg total) by mouth daily.   ezetimibe 10 MG tablet Commonly known as: ZETIA Take 1 tablet (10 mg total) by mouth daily.   HumaLOG Mix 75/25 (75-25) 100 UNIT/ML Susp injection Generic drug: insulin lispro protamine-lispro INJECT 17 UNITS SUBCUTANEOUSLY TWICE DAILY WITH A MEAL. MAX OF 50 UNITS DAILY.   hydrochlorothiazide 25 MG tablet Commonly known as: HYDRODIURIL Take 1 tablet (25  mg total) by mouth daily.   isosorbide mononitrate 60 MG 24 hr tablet Commonly known as: IMDUR Take 1 tablet (60 mg total) by mouth daily.   lisinopril 10 MG tablet Commonly known as: ZESTRIL Take 1 tablet (10 mg total) by mouth daily.   metFORMIN 1000 MG tablet Commonly known as: GLUCOPHAGE TAKE 1 TABLET BY MOUTH TWICE DAILY WITH A MEAL   metoprolol succinate 50 MG 24 hr tablet Commonly known as: TOPROL-XL Take with or immediately following a meal.   Syringe (Disposable) 3 ML  Misc Use to inject humalog mix 75/25 17u BID        Discharge Exam: Filed Weights   12/09/21 1854  Weight: (!) 137.5 kg   ***  Condition at discharge: {DC Condition:26389}  The results of significant diagnostics from this hospitalization (including imaging, microbiology, ancillary and laboratory) are listed below for reference.   Imaging Studies: CARDIAC CATHETERIZATION  Result Date: 12/10/2021   -----------------LEFT CORONARY---------------------------   Mid LM to Dist LM lesion is 15% stenosed with 25% stenosed side branch in Ramus.   Ost LAD to Prox LAD lesion is 70% stenosed.  Prox LAD to Mid LAD lesion is 100% stenosed.   Ost Cx to Prox Cx lesion is 100% stenosed.   Prox Cx to Mid Cx lesion is 80% stenosed.   -----------------GRAFTS---------------------------   LIMA graft was visualized by angiography and is large.  The graft exhibits no disease.  There is no competitive flow   SVG-OM graft was visualized by angiography. The graft exhibits no disease.   SVG-RI gaft was visulalized by angiograph => Origin to Prox Graft lesion is 100% stenosed.   ------------------------------------------------------   -----------------RIGHT CORONARY---------------------------   CULPRIT LESION: Prox RCA lesion is 95% stenosed.   Balloon angioplasty was performed using sequential BALLN SCOREFLEX Balloons 3.0 x 10 & 3.50X10.   A drug-eluting stent was successfully placed using a STENT ONYX FRONTIER 3.5X15 -> postdilated to 4.1 mm   Post intervention, there is a 0% residual stenosis.   ------------------------------------------------------   The left ventricular systolic function is normal.   LV end diastolic pressure is normal. SUMMARY Severe Native CAD: 100% CTO of proximal LCx after OM1/RI, 90% followed 100% CTO of LAD after SP1; Widely patent ostial RI CULPRIT LESION 95% focal (napkin ring) proximal RCA Successful scoring balloon (sequential 3.0 mm & 3.5 mm ScoreFlex) angioplasty followed by => DES PCI of  proximal RCA Onyx Frontier DES 3.5 mm x 15 mm postdilated to 4.1 mm. 95% reduced to 0% stenosis; TIMI-3 flow pre and post.  Distal LAD has tandem 50% lesions, distal LCx with retrograde flow reveals 80% stenosis prior to anastomosis. 2 of 3 grafts patent: SVG-OM, LIMA-LAD with flush occlusion of SVG-RI NORMAL LVEDP.  LV gram not performed-Echo done today.  PLAN OF CARE:  Monitor overnight post PCI, continue aggressive cardiovascular risk factor modification with guideline medical therapy.  Anticipate morning discharge.  Phase 2 cardiac rehab ordered Torez Beauregard   DG Chest 2 View  Result Date: 12/09/2021 CLINICAL DATA:  Chest pain. EXAM: CHEST - 2 VIEW COMPARISON:  12/07/2021 FINDINGS: The heart size and mediastinal contours are within normal limits. Prior CABG. There is no evidence of pulmonary edema, consolidation, pneumothorax, nodule or pleural fluid. The visualized skeletal structures are unremarkable. IMPRESSION: No active cardiopulmonary disease. Electronically Signed   By: Irish Lack M.D.   On: 12/09/2021 19:14   ECHOCARDIOGRAM COMPLETE  Result Date: 12/09/2021    ECHOCARDIOGRAM REPORT   Patient Name:  Craig Harvey Date of Exam: 12/08/2021 Medical Rec #:  [id number]       Height:       68.0 in Accession #:    [id number]      Weight:       302.0 lb Date of Birth:  August 12, 1955      BSA:          2.435 m Patient Age:    65 years        BP:           100/60 mmHg Patient Gender: M               HR:           85 bpm. Exam Location:  [facility] Procedure: 2D Echo, Cardiac Doppler and Color Doppler Indications:     I21.4 NSTEMI  History:         Patient has prior history of Echocardiogram examinations, most                  recent 01/14/2015. CAD; Risk Factors:Sleep Apnea, Diabetes,                  Hypertension and Dyslipidemia.  Sonographer:     Daphine Deutscher RDCS Referring Phys:  [id number] Tonye Royalty Diagnosing Phys: Debbe Odea MD IMPRESSIONS  1. Left ventricular ejection fraction, by  estimation, is 60 to 65%. The left ventricle has normal function. The left ventricle has no regional wall motion abnormalities. There is mild left ventricular hypertrophy. Left ventricular diastolic parameters are consistent with Grade I diastolic dysfunction (impaired relaxation).  2. Right ventricular systolic function is normal. The right ventricular size is normal.  3. The mitral valve is grossly normal. No evidence of mitral valve regurgitation.  4. The aortic valve was not well visualized. Aortic valve regurgitation is not visualized.  5. Aortic dilatation noted. There is borderline dilatation of the aortic root, measuring 39 mm.  6. The inferior vena cava is normal in size with greater than 50% respiratory variability, suggesting right atrial pressure of 3 mmHg. FINDINGS  Left Ventricle: Left ventricular ejection fraction, by estimation, is 60 to 65%. The left ventricle has normal function. The left ventricle has no regional wall motion abnormalities. The left ventricular internal cavity size was normal in size. There is  mild left ventricular hypertrophy. Left ventricular diastolic parameters are consistent with Grade I diastolic dysfunction (impaired relaxation). Right Ventricle: The right ventricular size is normal. No increase in right ventricular wall thickness. Right ventricular systolic function is normal. Left Atrium: Left atrial size was normal in size. Right Atrium: Right atrial size was normal in size. Pericardium: There is no evidence of pericardial effusion. Mitral Valve: The mitral valve is grossly normal. No evidence of mitral valve regurgitation. Tricuspid Valve: The tricuspid valve is grossly normal. Tricuspid valve regurgitation is not demonstrated. Aortic Valve: The aortic valve was not well visualized. Aortic valve regurgitation is not visualized. Pulmonic Valve: The pulmonic valve was not well visualized. Pulmonic valve regurgitation is not visualized. Aorta: Aortic dilatation noted.  There is borderline dilatation of the aortic root, measuring 39 mm. Venous: The inferior vena cava is normal in size with greater than 50% respiratory variability, suggesting right atrial pressure of 3 mmHg. IAS/Shunts: No atrial level shunt detected by color flow Doppler.  LEFT VENTRICLE PLAX 2D LVIDd:         4.86 cm   Diastology LVIDs:         2.92 cm  LV e' medial:    6.42 cm/s LV PW:         1.08 cm   LV E/e' medial:  8.7 LV IVS:        1.12 cm   LV e' lateral:   11.10 cm/s LVOT diam:     2.50 cm   LV E/e' lateral: 5.0 LV SV:         61 LV SV Index:   25 LVOT Area:     4.91 cm  RIGHT VENTRICLE            IVC RV Basal diam:  3.94 cm    IVC diam: 1.53 cm RV S prime:     6.64 cm/s LEFT ATRIUM             Index        RIGHT ATRIUM           Index LA diam:        3.80 cm 1.56 cm/m   RA Area:     12.60 cm LA Vol (A2C):   46.2 ml 18.97 ml/m  RA Volume:   30.50 ml  12.53 ml/m LA Vol (A4C):   35.8 ml 14.70 ml/m LA Biplane Vol: 42.9 ml 17.62 ml/m  AORTIC VALVE LVOT Vmax:   90.80 cm/s LVOT Vmean:  67.400 cm/s LVOT VTI:    0.125 m  AORTA Ao Root diam: 3.90 cm Ao Asc diam:  3.60 cm MITRAL VALVE MV Area (PHT): 3.60 cm    SHUNTS MV Decel Time: 211 msec    Systemic VTI:  0.12 m MV E velocity: 55.70 cm/s  Systemic Diam: 2.50 cm MV A velocity: 73.30 cm/s MV E/A ratio:  0.76 Debbe Odea MD Electronically signed by Debbe Odea MD Signature Date/Time: 12/09/2021/5:38:29 PM    Final    DG Chest 2 View  Result Date: 12/07/2021 CLINICAL DATA:  chest tightness EXAM: CHEST - 2 VIEW COMPARISON:  Chest x-ray 02/04/2015 FINDINGS: The heart and mediastinal contours are within normal limits. No focal consolidation. Slightly increased interstitial markings. No pleural effusion. No pneumothorax. No acute osseous abnormality.  Intact sternotomy wires. IMPRESSION: Slightly increased interstitial markings which may represent viral illness versus small airway disease. Electronically Signed   By: Tish Frederickson M.D.   On:  12/07/2021 23:15    Microbiology: Results for orders placed or performed during the hospital encounter of 01/09/15  MRSA PCR Screening     Status: None   Collection Time: 01/09/15  3:25 PM   Specimen: Nasopharyngeal  Result Value Ref Range Status   MRSA by PCR NEGATIVE NEGATIVE Final    Comment:        The GeneXpert MRSA Assay (FDA approved for NASAL specimens only), is one component of a comprehensive MRSA colonization surveillance program. It is not intended to diagnose MRSA infection nor to guide or monitor treatment for MRSA infections.   Surgical pcr screen     Status: None   Collection Time: 01/11/15  5:47 PM   Specimen: Nasal Mucosa; Nasal Swab  Result Value Ref Range Status   MRSA, PCR NEGATIVE NEGATIVE Final   Staphylococcus aureus NEGATIVE NEGATIVE Final    Comment:        The Xpert SA Assay (FDA approved for NASAL specimens in patients over 68 years of age), is one component of a comprehensive surveillance program.  Test performance has been validated by [facility] for patients greater than or equal to 78 year old. It is not intended  to diagnose infection nor to guide or monitor treatment.     Labs: CBC: Recent Labs  Lab 12/07/21 2328 12/08/21 0825 12/08/21 1243 12/09/21 0627 12/09/21 1856 12/10/21 0320 12/11/21 0456  WBC 5.2   < > 4.2 4.6 4.4 4.9 3.8*  NEUTROABS 3.5  --   --   --   --   --   --   HGB 12.5*   < > 12.8* 11.8* 13.2 12.3* 11.8*  HCT 38.2*   < > 39.7 36.0* 40.7 37.5* 36.3*  MCV 83.6   < > 84.6 84.1 83.9 83.3 83.6  PLT 176   < > 164 169 202 169 148*   < > = values in this interval not displayed.   Basic Metabolic Panel: Recent Labs  Lab 12/07/21 2328 12/08/21 1243 12/09/21 1856 12/10/21 2124 12/11/21 0456  NA 129* 132* 130*  --  135  K 3.9 4.1 3.4*  --  3.7  CL 98 100 98  --  108  CO2 22 24 19*  --  23  GLUCOSE 100* 117* 138*  --  112*  BUN --  10  CREATININE 0.82 0.76 0.90 0.76 0.66  CALCIUM 9.3 9.2 9.2  --   8.5*   Liver Function Tests: Recent Labs  Lab 12/07/21 2328  AST 36  ALT 43  ALKPHOS 43  BILITOT 0.6  PROT 7.5  ALBUMIN 4.1   CBG: Recent Labs  Lab 12/10/21 1142 12/10/21 1712 12/10/21 2021 12/11/21 0726 12/11/21 1205  GLUCAP 150* 164* 133* 105* 154*    Discharge time spent: {LESS THAN/GREATER THAN:26388} 30 minutes.  Signed: Lurene Shadow, MD [facility] 12/11/2021

## 2021-12-11 NOTE — Progress Notes (Signed)
Progress Note  Patient Name: Craig Harvey Date of Encounter: 12/11/2021  [facility] Cardiologist: Julien Nordmann, MD   Subjective   Patient seen on AM rounds. Denis any chest pain or shortness of breath.  Underwent LHC with DES placement on 12/10/2021.  Inpatient Medications    Scheduled Meds:  aspirin EC  81 mg Oral Daily   atorvastatin  80 mg Oral Daily   clopidogrel  75 mg Oral Daily   ezetimibe  10 mg Oral Daily   heparin  5,000 Units Subcutaneous Q8H   insulin aspart  0-20 Units Subcutaneous TID WC   insulin aspart  0-5 Units Subcutaneous QHS   isosorbide mononitrate  60 mg Oral Daily   lisinopril  10 mg Oral Daily   metoprolol succinate  50 mg Oral Daily   sodium chloride flush  3 mL Intravenous Q12H   Continuous Infusions:  PRN Meds: acetaminophen, alum & mag hydroxide-simeth, nitroGLYCERIN, ondansetron (ZOFRAN) IV, sodium chloride flush   Vital Signs    Vitals:   12/11/21 0645 12/11/21 0726 12/11/21 0755 12/11/21 0915  BP:  (!) 144/90    Pulse:  65    Resp: (!) 22 18 13 17   Temp:  98.4 F (36.9 C)    TempSrc:  Oral    SpO2:  99%    Weight:      Height:        Intake/Output Summary (Last 24 hours) at 12/11/2021 0944 Last data filed at 12/11/2021 0500 Gross per 24 hour  Intake 1916.67 ml  Output 1250 ml  Net 666.67 ml      12/09/2021    6:54 PM 12/09/2021   12:56 AM 12/07/2021   10:17 PM  Last 3 Weights  Weight (lbs) 303 lb 2.1 oz 303 lb 3.2 oz 302 lb  Weight (kg) 137.5 kg 137.531 kg 136.986 kg      Telemetry    SR with large quantity of artifact- Personally Reviewed  ECG    SR with LAD- Personally Reviewed  Physical Exam   GEN: No acute distress.   Neck: No JVD appreciated Cardiac: RRR, no murmurs, rubs, or gallops.  Respiratory: Clear to auscultation bilaterally. GI: Soft, nontender, non-distended  MS: No edema; No deformity.Left wrist cath site with opsite and gauze dressing clean,dry, and intact, site without bleeding or hematoma,  radial pulse 2+ Neuro:  Nonfocal  Psych: Normal affect   Labs    High Sensitivity Troponin:   Recent Labs  Lab 12/08/21 0141 12/08/21 1243 12/08/21 1615 12/09/21 1856 12/09/21 2110  TROPONINIHS 30* 32* 31* 28* 28*     Chemistry Recent Labs  Lab 12/07/21 2328 12/08/21 1243 12/09/21 1856 12/10/21 2124 12/11/21 0456  NA 129* 132* 130*  --  135  K 3.9 4.1 3.4*  --  3.7  CL 98 100 98  --  108  CO2 22 24 19*  --  23  GLUCOSE 100* 117* 138*  --  112*  BUN 16 11 12   --  10  CREATININE 0.82 0.76 0.90 0.76 0.66  CALCIUM 9.3 9.2 9.2  --  8.5*  PROT 7.5  --   --   --   --   ALBUMIN 4.1  --   --   --   --   AST 36  --   --   --   --   ALT 43  --   --   --   --   ALKPHOS 43  --   --   --   --  BILITOT 0.6  --   --   --   --   GFRNONAA >60 >60 >60 >60 >60  ANIONGAP 9 8 13   --  4*    Lipids  Recent Labs  Lab 12/08/21 1243  CHOL 88  TRIG 63  HDL 34*  LDLCALC 41  CHOLHDL 2.6    Hematology Recent Labs  Lab 12/09/21 1856 12/10/21 0320 12/11/21 0456  WBC 4.4 4.9 3.8*  RBC 4.85 4.50 4.34  HGB 13.2 12.3* 11.8*  HCT 40.7 37.5* 36.3*  MCV 83.9 83.3 83.6  MCH 27.2 27.3 27.2  MCHC 32.4 32.8 32.5  RDW 14.1 14.1 14.3  PLT 202 169 148*   Thyroid  Recent Labs  Lab 12/08/21 1243  TSH 0.888    BNPNo results for input(s): "BNP", "PROBNP" in the last 168 hours.  DDimer No results for input(s): "DDIMER" in the last 168 hours.   Radiology     Cardiac Studies   LHC with graft visualization and placement of DES to the proximal RCA completed on 12/10/2021   -----------------LEFT CORONARY---------------------------   Mid LM to Dist LM lesion is 15% stenosed with 25% stenosed side branch in Ramus.   Ost LAD to Prox LAD lesion is 70% stenosed.  Prox LAD to Mid LAD lesion is 100% stenosed.   Ost Cx to Prox Cx lesion is 100% stenosed.   Prox Cx to Mid Cx lesion is 80% stenosed.   -----------------GRAFTS---------------------------   LIMA graft was visualized by angiography  and is large.  The graft exhibits no disease.  There is no competitive flow   SVG-OM graft was visualized by angiography. The graft exhibits no disease.   SVG-RI gaft was visulalized by angiograph => Origin to Prox Graft lesion is 100% stenosed.   ------------------------------------------------------   -----------------RIGHT CORONARY---------------------------   CULPRIT LESION: Prox RCA lesion is 95% stenosed.   Balloon angioplasty was performed using sequential BALLN SCOREFLEX Balloons 3.0 x 10 & 3.50X10.   A drug-eluting stent was successfully placed using a STENT ONYX FRONTIER 3.5X15 -> postdilated to 4.1 mm   Post intervention, there is a 0% residual stenosis.   ------------------------------------------------------   The left ventricular systolic function is normal.   LV end diastolic pressure is normal.   SUMMARY Severe Native CAD:  100% CTO of proximal LCx after OM1/RI, 90% followed 100% CTO of LAD after SP1;  Widely patent ostial RI CULPRIT LESION 95% focal (napkin ring) proximal RCA Successful scoring balloon (sequential 3.0 mm & 3.5 mm ScoreFlex) angioplasty followed by => DES PCI of proximal RCA Onyx Frontier DES 3.5 mm x 15 mm postdilated to 4.1 mm. 95% reduced to 0% stenosis; TIMI-3 flow pre and post.   Distal LAD has tandem 50% lesions, distal LCx with retrograde flow reveals 80% stenosis prior to anastomosis. 2 of 3 grafts patent: SVG-OM, LIMA-LAD with flush occlusion of SVG-RI NORMAL LVEDP.  LV gram not performed-Echo done today.  Diagnostic Dominance: Right  Intervention     Echocardiogram completed on 12/08/2021 1. Left ventricular ejection fraction, by estimation, is 60 to 65%. The  left ventricle has normal function. The left ventricle has no regional  wall motion abnormalities. There is mild left ventricular hypertrophy.  Left ventricular diastolic parameters  are consistent with Grade I diastolic dysfunction (impaired relaxation).   2. Right ventricular  systolic function is normal. The right ventricular  size is normal.   3. The mitral valve is grossly normal. No evidence of mitral valve  regurgitation.   4. The  aortic valve was not well visualized. Aortic valve regurgitation  is not visualized.   5. Aortic dilatation noted. There is borderline dilatation of the aortic  root, measuring 39 mm.   6. The inferior vena cava is normal in size with greater than 50%  respiratory variability, suggesting right atrial pressure of 3 mmHg.   Patient Profile     66 y.o. male with a hisotry of CAD s/p CABG x 3, essential hypertension, hyperlipidemia, diabetes, OSA on CPAP, obesity who is being seen of the evaluation of chest pain that underwent LHC with graft visualization and DES placement to the proximal RCA.   Assessment & Plan    Chest pain         CAD s/p CABG x 3 -patient had returned to the emergency department after hospital discharge earlier in the day with recurrent chest pain  -on discharge his imdur was increased from 30 mg to 60 mg but unfortunately he had not taken the increased dose prior to his return -with recurrent chest pain the decision was made for LHC with graft visualization -currently chest pain free -he received a DES to the proximal RCA -continue with aggressive cardiovascular risk factor modification -continue plavix, asa, atorvastatin, zetia, lisinopril, and metoprolol -continue imdur 60 mg daily as previously changed -cardiac rehab as outpatient   2. Essential hypertension -continue PTA medications -blood pressure has been better controlled    3.Hyperlipidemia -continue atorvastatin and zetia -LDL 41 -LPa ordered and pending  4. OSA on CPAP -continue CPAP     For questions or updates, please contact [facility] Please consult [email or web address] for contact info under        Signed, Karlisa Gaubert, NP  12/11/2021, 9:44 AM

## 2021-12-14 ENCOUNTER — Telehealth: Payer: Self-pay

## 2021-12-14 NOTE — Progress Notes (Signed)
Cardiology Office Note    Date:  12/15/2021   ID:  Craig Harvey, DOB Nov 25, 1955, MRN [id number]  PCP:  Smitty Cords, DO  Cardiologist:  Julien Nordmann, MD  Electrophysiologist:  None   Chief Complaint: Hospital follow-up  History of Present Illness:   Craig Harvey is a 66 y.o. male with history of CAD status post three-vessel CABG in 01/2015 status post PCI/DES to the native proximal RCA on 12/10/2021 with postoperative A-fib, HTN, HLD, IDDM, obesity, and sleep apnea who presents for hospital follow-up as outlined below.  He was admitted to the hospital in 01/2015 with unstable angina and ruled out.  Echo showed an EF of 60 to 65%, no regional wall motion abnormalities, no significant valvular abnormalities, and normal PASP.  Nuclear stress test showed a large defect of moderate severity in the mid anterior, mid anteroseptal, mid anterolateral, apical anterior, apical lateral, and apex locations.  Study was high risk.  He subsequently underwent LHC which showed severe two-vessel CAD as outlined below.  He was transferred to [facility] where he underwent three-vessel CABG with LIMA to LAD, SVG to OM, and SVG to ramus.  Postoperative course was notable for A-fib converting to sinus with IV amiodarone.  He was admitted to the hospital from 6/5 through 12/09/2021 with unstable angina.  High-sensitivity troponin peaked at 32.  Echo demonstrated an EF of 60 to 65%, no regional wall motion abnormalities, mild LVH, grade 1 diastolic dysfunction, normal RV systolic function and ventricular cavity size, no significant valvular abnormalities, borderline dilatation of the aortic root measuring 39 mm, and an estimated right atrial pressure of 3 mmHg.  Imdur was titrated to 60 mg daily.  He noted improvement in symptoms and outpatient ischemic testing was recommended.  However, he returned to the hospital the same day he was discharged (12/09/2021) with recurrent unstable angina and a troponin of 28.   LHC demonstrated severe native vessel CAD as outlined below with 2 of 3 grafts patent (LIMA to LAD and SVG to OM).  There was a flush occlusion of the SVG to ramus.  The culprit lesion was a 95% focal (napkin ring) proximal RCA stenosis.  He underwent successful scoring balloon angioplasty followed by DES with 0% residual stenosis.  He comes in doing well from a cardiac perspective and is without symptoms of angina or decompensation.  He did note some chills the first night he was back home, though these have subsequently resolved.  He reports he was without fever.  No dizziness, presyncope, or syncope.  No palpitations.  No significant lower extremity swelling.  No falls, hematochezia, melena, hemoptysis, hematuria, or hematemesis.  He has not missed any doses of DAPT.  He does note some mild fatigue.  He reports adherence to his CPAP on some nights, though not all nights.  He has his original cath dressing in place.   Labs independently reviewed: 12/2021 - potassium 3.7, BUN 10, serum creatinine 0.66, Hgb 11.8, PLT 148, LPa 236, TC 88, TG 63, HDL 34, LDL 41, TSH normal, albumin 4.1, AST/ALT normal 07/2021 - A1c 6.4  Past Medical History:  Diagnosis Date   CAD (coronary artery disease)    a. 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 cardiac cath 01/09/2015: ostLAD 70:, mLAD 99%, ost to pLCx 95%, mLCx 80%,  LVEF nl, recommend CABGl; c. s/p 3v CABG 01/14/15 LIMA-LAD, SVG-OM, SVG-Ramus   History of echocardiogram    a. 01/2015 Echo: EF 60-65%, no rwma, nl RV fxn. Nl PASP.   Hyperlipidemia LDL goal <70    Hypertension    Morbid obesity (HCC)    OSA (obstructive sleep apnea)    Type II diabetes mellitus (HCC)     Past Surgical History:  Procedure Laterality Date   CARDIAC CATHETERIZATION N/A 01/09/2015    Procedure: Left Heart Cath and Coronary Angiography;  Surgeon: Antonieta Iba, MD;  Location: [facility] INVASIVE CV LAB;  Service: Cardiovascular;  Laterality: N/A;   CORONARY ARTERY BYPASS GRAFT N/A 01/14/2015   Procedure: CORONARY ARTERY BYPASS GRAFTING (CABG), ON PUMP, TIMES THREE, USING LEFT INTERNAL MAMMARY ARTERY, RIGHT GREATER SAPHENOUS VEIN HARVESTED ENDOSCOPICALLY;  Surgeon: Kerin Perna, MD;  Location: [facility] OR;  Service: Open Heart Surgery;  Laterality: N/A;   CORONARY STENT INTERVENTION N/A 12/10/2021   Procedure: CORONARY STENT INTERVENTION;  Surgeon: Marykay Lex, MD;  Location: [facility] INVASIVE CV LAB;  Service: Cardiovascular;  Laterality: N/A;   HERNIA REPAIR     LEFT HEART CATH AND CORONARY ANGIOGRAPHY N/A 12/10/2021   Procedure: LEFT HEART CATH AND CORONARY ANGIOGRAPHY;  Surgeon: Marykay Lex, MD;  Location: [facility] INVASIVE CV LAB;  Service: Cardiovascular;  Laterality: N/A;   TEE WITHOUT CARDIOVERSION N/A 01/14/2015   Procedure: TRANSESOPHAGEAL ECHOCARDIOGRAM (TEE);  Surgeon: Kerin Perna, MD;  Location: [facility] OR;  Service: Open Heart Surgery;  Laterality: N/A;    Current Medications: Current Meds  Medication Sig   aspirin EC 81 MG tablet Take 1 tablet (81 mg total) by mouth daily.   atorvastatin (LIPITOR) 80 MG tablet Take 1 tablet (80 mg total) by mouth daily.   baclofen (LIORESAL) 10 MG tablet Take 0.5-1 tablets (5-10 mg total) by mouth 2 (two) times daily as needed for muscle spasms (leg pain).   BD VEO INSULIN SYRINGE U/F 31G X 15/64" 0.3 ML MISC USE AS DIRECTED TWICE DAILY   clopidogrel (PLAVIX) 75 MG tablet Take 1 tablet (75 mg total) by mouth daily.   ezetimibe (ZETIA) 10 MG tablet Take 1 tablet (10 mg total) by mouth daily.   glucose blood (ACCU-CHEK AVIVA PLUS) test strip Use to check blood sugar up to 3 x daily   hydrochlorothiazide (HYDRODIURIL) 25 MG tablet Take 1 tablet (25 mg total) by mouth daily.   insulin lispro protamine-lispro (HUMALOG MIX 75/25) (75-25) 100  UNIT/ML SUSP injection INJECT 17 UNITS SUBCUTANEOUSLY TWICE DAILY WITH A MEAL. MAX OF 50 UNITS DAILY.   isosorbide mononitrate (IMDUR) 60 MG 24 hr tablet Take 1 tablet (60 mg total) by mouth daily.   lisinopril (ZESTRIL) 10 MG tablet Take 1 tablet (10 mg total) by mouth daily.   metFORMIN (GLUCOPHAGE) 1000 MG tablet TAKE 1 TABLET BY MOUTH TWICE DAILY WITH A MEAL   Syringe, Disposable, 3 ML MISC Use to inject humalog mix 75/25 17u BID   [DISCONTINUED] metoprolol succinate (TOPROL-XL) 50 MG 24 hr tablet Take with or immediately following a meal.    Allergies:   Trulicity [dulaglutide]   Social History   Socioeconomic History   Marital status: Single    Spouse name: Not on file   Number of children: Not on file   Years of education: Not on file   Highest education level: Not on file  Occupational History   Occupation: Former Naval architect / Production designer, theatre/television/film  Tobacco Use   Smoking  status: Former    Types: Cigarettes    Quit date: 08/06/1975    Years since quitting: 46.3   Smokeless tobacco: Never  Vaping Use   Vaping Use: Never used  Substance and Sexual Activity   Alcohol use: No   Drug use: No   Sexual activity: Not on file  Other Topics Concern   Not on file  Social History Narrative   Not on file   Social Determinants of Health   Financial Resource Strain: Low Risk  (05/20/2021)   Overall Financial Resource Strain (CARDIA)    Difficulty of Paying Living Expenses: Not hard at all  Food Insecurity: No Food Insecurity (05/20/2021)   Hunger Vital Sign    Worried About Running Out of Food in the Last Year: Never true    Ran Out of Food in the Last Year: Never true  Transportation Needs: No Transportation Needs (05/20/2021)   PRAPARE - Administrator, Civil Service (Medical): No    Lack of Transportation (Non-Medical): No  Physical Activity: Sufficiently Active (05/20/2021)   Exercise Vital Sign    Days of Exercise per Week: 4 days    Minutes of Exercise per  Session: 40 min  Stress: No Stress Concern Present (05/20/2021)   [employer] of Occupational Health - Occupational Stress Questionnaire    Feeling of Stress : Not at all  Social Connections: Socially Isolated (05/20/2021)   Social Connection and Isolation Panel [NHANES]    Frequency of Communication with Friends and Family: Three times a week    Frequency of Social Gatherings with Friends and Family: Three times a week    Attends Religious Services: Never    Active Member of Clubs or Organizations: No    Attends Banker Meetings: Never    Marital Status: Never married     Family History:  The patient's family history includes CAD in his brother and mother; Throat cancer in his father. There is no history of Diabetes, Cancer, Prostate cancer, or Colon cancer.  ROS:   12-point review of systems is negative unless otherwise noted in the HPI.   EKGs/Labs/Other Studies Reviewed:    Studies reviewed were summarized above. The additional studies were reviewed today:  LHC 12/10/2021:   -----------------LEFT CORONARY---------------------------   Mid LM to Dist LM lesion is 15% stenosed with 25% stenosed side branch in Ramus.   Ost LAD to Prox LAD lesion is 70% stenosed.  Prox LAD to Mid LAD lesion is 100% stenosed.   Ost Cx to Prox Cx lesion is 100% stenosed.   Prox Cx to Mid Cx lesion is 80% stenosed.   -----------------GRAFTS---------------------------   LIMA graft was visualized by angiography and is large.  The graft exhibits no disease.  There is no competitive flow   SVG-OM graft was visualized by angiography. The graft exhibits no disease.   SVG-RI gaft was visulalized by angiograph => Origin to Prox Graft lesion is 100% stenosed.   ------------------------------------------------------   -----------------RIGHT CORONARY---------------------------   CULPRIT LESION: Prox RCA lesion is 95% stenosed.   Balloon angioplasty was performed using sequential BALLN  SCOREFLEX Balloons 3.0 x 10 & 3.50X10.   A drug-eluting stent was successfully placed using a STENT ONYX FRONTIER 3.5X15 -> postdilated to 4.1 mm   Post intervention, there is a 0% residual stenosis.   ------------------------------------------------------   The left ventricular systolic function is normal.   LV end diastolic pressure is normal.   SUMMARY Severe Native CAD:  100% CTO of proximal  LCx after OM1/RI, 90% followed 100% CTO of LAD after SP1;  Widely patent ostial RI CULPRIT LESION 95% focal (napkin ring) proximal RCA Successful scoring balloon (sequential 3.0 mm & 3.5 mm ScoreFlex) angioplasty followed by => DES PCI of proximal RCA Onyx Frontier DES 3.5 mm x 15 mm postdilated to 4.1 mm. 95% reduced to 0% stenosis; TIMI-3 flow pre and post.   Distal LAD has tandem 50% lesions, distal LCx with retrograde flow reveals 80% stenosis prior to anastomosis. 2 of 3 grafts patent: SVG-OM, LIMA-LAD with flush occlusion of SVG-RI NORMAL LVEDP.  LV gram not performed-Echo done today.     PLAN OF CARE:  Monitor overnight post PCI, continue aggressive cardiovascular risk factor modification with guideline medical therapy.  Anticipate morning discharge.  Phase 2 cardiac rehab ordered __________  2D echo 12/08/2021: 1. Left ventricular ejection fraction, by estimation, is 60 to 65%. The  left ventricle has normal function. The left ventricle has no regional  wall motion abnormalities. There is mild left ventricular hypertrophy.  Left ventricular diastolic parameters  are consistent with Grade I diastolic dysfunction (impaired relaxation).   2. Right ventricular systolic function is normal. The right ventricular  size is normal.   3. The mitral valve is grossly normal. No evidence of mitral valve  regurgitation.   4. The aortic valve was not well visualized. Aortic valve regurgitation  is not visualized.   5. Aortic dilatation noted. There is borderline dilatation of the aortic  root,  measuring 39 mm.   6. The inferior vena cava is normal in size with greater than 50%  respiratory variability, suggesting right atrial pressure of 3 mmHg. __________  LHC 01/09/2015: Ost Cx to Prox Cx lesion, 95% stenosed. Ost LAD lesion, 70% stenosed. The left ventricular systolic function is normal. Mid Cx lesion, 80% stenosed. Mid LAD lesion, 99% stenosed.   Right dominant coronary arterial system Severe two-vessel disease: Ostial LAD estimated at 70-80%, eccentric calcification, LAD is a large vessel Also with critical proximal LAD lesion estimated at 95-99%. Proximal circumflex with severe 95% lesion after the takeoff of OM1, long, calcified, circumflex is a large vessel Mid circumflex also with 80% lesion, focal, prior to the takeoff of OM 2 Right coronary artery with mild diffuse disease   Case was discussed with Dr. Kirke Corin. Given his unstable angina, severity of the proximal LAD lesion, presence of a calcified ostial LAD stenosis, as well as multiple regions of stenosis requiring intervention, it was suggested he be evaluated for possible bypass surgery. This would likely provide him a better long-term solution to his diffuse 2 vessel CAD.  Given his unstable angina, degree of stenosis, he will be transferred to [facility] today.    Results were discussed with the patient __________  Treadmill MPI 01/07/2015: This is a HIGH RISK study. Defect 1: There is a large defect of moderate severity present in the mid anterior, mid anteroseptal, mid anterolateral, apical anterior, apical inferior, apical lateral and apex location. Findings consistent with ischemia and prior myocardial infarction with peri-infarct ischemia. Upsloping ST segment depression ST segment depression was noted during stress in the inferolateral leads (II, III, aVF, V5 and V6), and returning to baseline after 5-9 minutes of recovery. Blood pressure demonstrated a hypertensive response to exercise with Poor  Exercise tolerance. The left ventricular ejection fraction is low normal - estimated EF ~53% (45-54%).   HIGH RISK Test due to Poor Exercise Tolerance, Large-partially reversible perfusion defect in the likely LAD distribution.   Would  consider Cardiac Catheterization. __________  2D echo 01/06/2015: - Left ventricle: The cavity size was normal. Systolic function was    normal. The estimated ejection fraction was in the range of 60%    to 65%. Wall motion was normal; there were no regional wall    motion abnormalities. Left ventricular diastolic function    parameters were normal.  - Aortic valve: Valve area (Vmax): 4.8 cm^2.  - Left atrium: The atrium was normal in size.  - Right ventricle: Systolic function was normal.  - Pulmonary arteries: Systolic pressure was within the normal    range.   Impressions:   - Normal study.    EKG:  EKG is ordered today.  The EKG ordered today demonstrates NSR, 92 bpm, first-degree AV block, prior septal and anterior infarct, nonspecific ST-T changes involving the inferolateral leads consistent with prior RCA territory ischemia  Recent Labs: 12/07/2021: ALT 43 12/08/2021: TSH 0.888 12/15/2021: BUN 14; Creatinine, Ser 0.94; Hemoglobin 12.7; Platelets 191; Potassium 3.4; Sodium 129  Recent Lipid Panel    Component Value Date/Time   CHOL 88 12/08/2021 1243   CHOL 158 06/09/2016 1202   TRIG 63 12/08/2021 1243   HDL 34 (L) 12/08/2021 1243   HDL 46 06/09/2016 1202   CHOLHDL 2.6 12/08/2021 1243   VLDL 13 12/08/2021 1243   LDLCALC 41 12/08/2021 1243   LDLCALC 39 07/23/2021 0845    PHYSICAL EXAM:    VS:  BP 100/64 (BP Location: Left Arm, Patient Position: Sitting, Cuff Size: Large)   Pulse 92   Ht 5\' 8"  (1.727 m)   Wt 298 lb 6.4 oz (135.4 kg)   SpO2 94%   BMI 45.37 kg/m   BMI: Body mass index is 45.37 kg/m.  Physical Exam Constitutional:      Appearance: He is well-developed.  HENT:     Head: Normocephalic and atraumatic.  Eyes:      General:        Right eye: No discharge.        Left eye: No discharge.  Neck:     Vascular: No JVD.  Cardiovascular:     Rate and Rhythm: Normal rate and regular rhythm.     Pulses:          Posterior tibial pulses are 2+ on the right side and 2+ on the left side.     Heart sounds: Normal heart sounds, S1 normal and S2 normal. Heart sounds not distant. No midsystolic click and no opening snap. No murmur heard.    No friction rub.     Comments: Left radial arteriotomy site with original dressing noted in place.  This was taken down.  There is mild soft ecchymosis along the medial and lateral arteriotomy site without significant tenderness to palpation, erythema, swelling, or warmth.  Radial pulse is 2+ proximal and distal to the arteriotomy site.   Pulmonary:     Effort: Pulmonary effort is normal. No respiratory distress.     Breath sounds: Normal breath sounds. No decreased breath sounds, wheezing or rales.  Chest:     Chest wall: No tenderness.  Abdominal:     General: There is no distension.     Palpations: Abdomen is soft.     Tenderness: There is no abdominal tenderness.  Musculoskeletal:     Cervical back: Normal range of motion.     Right lower leg: No edema.     Left lower leg: No edema.  Skin:    General: Skin is warm  and dry.     Nails: There is no clubbing.  Neurological:     Mental Status: He is alert and oriented to person, place, and time.  Psychiatric:        Speech: Speech normal.        Behavior: Behavior normal.        Thought Content: Thought content normal.        Judgment: Judgment normal.     Wt Readings from Last 3 Encounters:  12/15/21 298 lb (135.2 kg)  12/15/21 298 lb 6.4 oz (135.4 kg)  12/09/21 (!) 303 lb 2.1 oz (137.5 kg)     ASSESSMENT & PLAN:   CAD involving the native coronary status post CABG status post PCI without angina: He is doing well without any symptoms concerning for angina or decompensation.  Continue DAPT (aspirin 81 mg and  clopidogrel 75 mg ) without interruption for a minimum of 6 to 12 months from date of PCI.  Continue aggressive risk factor modification and secondary prevention including atorvastatin, ezetimibe, isosorbide mononitrate, lisinopril, and metoprolol succinate.  Post-cath instructions were discussed in detail.  Cardiac rehab is recommended and he is okay to participate from our perspective.  HTN: Blood pressure is slightly soft in the office today.  Decrease Toprol-XL to 25 mg daily.  He will otherwise continue current doses of HCTZ, Imdur, and metoprolol.  HLD: LDL 41 with a triglyceride of 63.  He remains on atorvastatin 80 mg and ezetimibe.  Post operative Afib: No objective evidence for recurrence of arrhythmia.  Obesity with sleep apnea and fatigue: When compared to his last clinic visit with [place], his weight is down 15 pounds today.  Recommend nightly adherence to CPAP.  Fatigue is likely multifactorial including underlying OSA with intermittent CPAP adherence, recent admission, anemia, and possibly some degree of relative hypotension with concomitant beta-blocker usage.  We are decreasing Toprol-XL as outlined below.  First-degree AV block: Decrease Toprol-XL to 25 mg daily.  Monitor at next visit.  Anemia: Check CBC.  Check iron level at his request.   Disposition: F/u with Dr. Mariah Milling or an APP in 2 months.   Medication Adjustments/Labs and Tests Ordered: Current medicines are reviewed at length with the patient today.  Concerns regarding medicines are outlined above. Medication changes, Labs and Tests ordered today are summarized above and listed in the Patient Instructions accessible in Encounters.   Signed, Eula Listen, PA-C 12/15/2021 11:28 AM     [facility] [place] [address] [place], [place] [address] [phone]

## 2021-12-14 NOTE — Telephone Encounter (Signed)
Transition Care Management Unsuccessful Follow-up Telephone Call  Date of discharge and from where:  12/11/2021 from [facility]  Attempts:  1st Attempt  Reason for unsuccessful TCM follow-up call:  Left voice message    

## 2021-12-15 ENCOUNTER — Encounter: Payer: Self-pay | Admitting: Intensive Care

## 2021-12-15 ENCOUNTER — Other Ambulatory Visit: Payer: Self-pay

## 2021-12-15 ENCOUNTER — Telehealth: Payer: Self-pay

## 2021-12-15 ENCOUNTER — Encounter: Payer: Self-pay | Admitting: Physician Assistant

## 2021-12-15 ENCOUNTER — Telehealth: Payer: Self-pay | Admitting: Internal Medicine

## 2021-12-15 ENCOUNTER — Other Ambulatory Visit: Payer: Self-pay | Admitting: *Deleted

## 2021-12-15 ENCOUNTER — Emergency Department
Admission: EM | Admit: 2021-12-15 | Discharge: 2021-12-15 | Disposition: A | Discharge status: Home / Self Care | Payer: Medicaid Other | Attending: Emergency Medicine | Admitting: Emergency Medicine

## 2021-12-15 ENCOUNTER — Ambulatory Visit (INDEPENDENT_AMBULATORY_CARE_PROVIDER_SITE_OTHER): Payer: Medicaid Other | Admitting: Physician Assistant

## 2021-12-15 ENCOUNTER — Other Ambulatory Visit
Admission: RE | Admit: 2021-12-15 | Discharge: 2021-12-15 | Disposition: A | Discharge status: Home / Self Care | Payer: Medicaid Other | Attending: Physician Assistant | Admitting: Physician Assistant

## 2021-12-15 VITALS — BP 100/64 | HR 92 | Ht 68.0 in | Wt 298.4 lb

## 2021-12-15 DIAGNOSIS — E785 Hyperlipidemia, unspecified: Secondary | ICD-10-CM

## 2021-12-15 DIAGNOSIS — I4891 Unspecified atrial fibrillation: Secondary | ICD-10-CM | POA: Diagnosis not present

## 2021-12-15 DIAGNOSIS — Z951 Presence of aortocoronary bypass graft: Secondary | ICD-10-CM

## 2021-12-15 DIAGNOSIS — D649 Anemia, unspecified: Secondary | ICD-10-CM | POA: Insufficient documentation

## 2021-12-15 DIAGNOSIS — I2510 Atherosclerotic heart disease of native coronary artery without angina pectoris: Secondary | ICD-10-CM | POA: Insufficient documentation

## 2021-12-15 DIAGNOSIS — I9789 Other postprocedural complications and disorders of the circulatory system, not elsewhere classified: Secondary | ICD-10-CM | POA: Diagnosis not present

## 2021-12-15 DIAGNOSIS — E871 Hypo-osmolality and hyponatremia: Secondary | ICD-10-CM | POA: Diagnosis not present

## 2021-12-15 DIAGNOSIS — I440 Atrioventricular block, first degree: Secondary | ICD-10-CM | POA: Diagnosis not present

## 2021-12-15 DIAGNOSIS — R5383 Other fatigue: Secondary | ICD-10-CM

## 2021-12-15 DIAGNOSIS — Z955 Presence of coronary angioplasty implant and graft: Secondary | ICD-10-CM

## 2021-12-15 DIAGNOSIS — I10 Essential (primary) hypertension: Secondary | ICD-10-CM | POA: Insufficient documentation

## 2021-12-15 DIAGNOSIS — G4733 Obstructive sleep apnea (adult) (pediatric): Secondary | ICD-10-CM | POA: Diagnosis not present

## 2021-12-15 DIAGNOSIS — Z6841 Body Mass Index (BMI) 40.0 and over, adult: Secondary | ICD-10-CM

## 2021-12-15 DIAGNOSIS — R55 Syncope and collapse: Secondary | ICD-10-CM | POA: Diagnosis not present

## 2021-12-15 DIAGNOSIS — E876 Hypokalemia: Secondary | ICD-10-CM

## 2021-12-15 LAB — CBC
HCT: 38.1 % — ABNORMAL LOW (ref 39.0–52.0)
HCT: 38.6 % — ABNORMAL LOW (ref 39.0–52.0)
Hemoglobin: 12.8 g/dL — ABNORMAL LOW (ref 13.0–17.0)
Hemoglobin: 12.7 g/dL — ABNORMAL LOW (ref 13.0–17.0)
MCH: 27.5 pg (ref 26.0–34.0)
MCH: 27.6 pg (ref 26.0–34.0)
MCHC: 33.6 g/dL (ref 30.0–36.0)
MCHC: 32.9 g/dL (ref 30.0–36.0)
MCV: 81.8 fL (ref 80.0–100.0)
MCV: 83.9 fL (ref 80.0–100.0)
Platelets: 204 10*3/uL (ref 150–400)
Platelets: 191 10*3/uL (ref 150–400)
RBC: 4.66 MIL/uL (ref 4.22–5.81)
RBC: 4.6 MIL/uL (ref 4.22–5.81)
RDW: 14.5 % (ref 11.5–15.5)
RDW: 14.4 % (ref 11.5–15.5)
WBC: 4.2 10*3/uL (ref 4.0–10.5)
WBC: 4.5 10*3/uL (ref 4.0–10.5)
nRBC: 0 % (ref 0.0–0.2)
nRBC: 0 % (ref 0.0–0.2)

## 2021-12-15 LAB — BASIC METABOLIC PANEL
Anion gap: 10 (ref 5–15)
Anion gap: 9 (ref 5–15)
BUN: 14 mg/dL (ref 8–23)
BUN: 14 mg/dL (ref 8–23)
CO2: 21 mmol/L — ABNORMAL LOW (ref 22–32)
CO2: 23 mmol/L (ref 22–32)
Calcium: 9.4 mg/dL (ref 8.9–10.3)
Calcium: 9.2 mg/dL (ref 8.9–10.3)
Chloride: 98 mmol/L (ref 98–111)
Chloride: 97 mmol/L — ABNORMAL LOW (ref 98–111)
Creatinine, Ser: 0.93 mg/dL (ref 0.61–1.24)
Creatinine, Ser: 0.94 mg/dL (ref 0.61–1.24)
GFR, Estimated: >60 mL/min (ref >60)
GFR, Estimated: >60 mL/min (ref >60)
Glucose, Bld: 111 mg/dL — ABNORMAL HIGH (ref 70–99)
Glucose, Bld: 140 mg/dL — ABNORMAL HIGH (ref 70–99)
Potassium: 3.4 mmol/L — ABNORMAL LOW (ref 3.5–5.1)
Potassium: 3.4 mmol/L — ABNORMAL LOW (ref 3.5–5.1)
Sodium: 129 mmol/L — ABNORMAL LOW (ref 135–145)
Sodium: 129 mmol/L — ABNORMAL LOW (ref 135–145)

## 2021-12-15 LAB — TROPONIN I (HIGH SENSITIVITY)
Troponin I (High Sensitivity): 116 ng/L (ref <18)
Troponin I (High Sensitivity): 107 ng/L (ref <18)

## 2021-12-15 LAB — FERRITIN: Ferritin: 16 ng/mL — ABNORMAL LOW (ref 24–336)

## 2021-12-15 LAB — CBG MONITORING, ED: Glucose-Capillary: 144 mg/dL — ABNORMAL HIGH (ref 70–99)

## 2021-12-15 LAB — IRON: Iron: 236 ug/dL — ABNORMAL HIGH (ref 45–182)

## 2021-12-15 MED ORDER — POTASSIUM CHLORIDE ER 10 MEQ PO TBCR: 10.0000 meq | EXTENDED_RELEASE_TABLET | Freq: Every day | ORAL | 0 refills | Status: DC

## 2021-12-15 MED ORDER — METOPROLOL SUCCINATE ER 25 MG PO TB24: 25.0000 mg | ORAL_TABLET | Freq: Every day | ORAL | 1 refills | Status: DC

## 2021-12-15 MED ORDER — SODIUM CHLORIDE 0.9 % IV BOLUS
1000.0000 mL | Freq: Once | INTRAVENOUS | Status: AC
  Administered 2021-12-15: 1000 mL via INTRAVENOUS

## 2021-12-15 NOTE — Progress Notes (Signed)
Cardiology Office Note    Date:  12/17/2021   ID:  Craig Harvey, DOB 1955-08-15, MRN [id number]  PCP:  Smitty Cords, DO  Cardiologist:  Julien Nordmann, MD  Electrophysiologist:  None   Chief Complaint: ED follow-up  History of Present Illness:   Craig Harvey is a 66 y.o. male with history of CAD status post three-vessel CABG in 01/2015 status post PCI/DES to the native proximal RCA on 12/10/2021 with postoperative A-fib, HTN, HLD, IDDM, obesity, and sleep apnea who presents for ED follow-up as outlined below.   He was admitted to the hospital in 01/2015 with unstable angina and ruled out.  Echo showed an EF of 60 to 65%, no regional wall motion abnormalities, no significant valvular abnormalities, and normal PASP.  Nuclear stress test showed a large defect of moderate severity in the mid anterior, mid anteroseptal, mid anterolateral, apical anterior, apical lateral, and apex locations.  Study was high risk.  He subsequently underwent LHC which showed severe two-vessel CAD as outlined below.  He was transferred to [facility] where he underwent three-vessel CABG with LIMA to LAD, SVG to OM, and SVG to ramus.  Postoperative course was notable for A-fib converting to sinus with IV amiodarone.   He was admitted to the hospital from 6/5 through 12/09/2021 with unstable angina.  High-sensitivity troponin peaked at 32.  Echo demonstrated an EF of 60 to 65%, no regional wall motion abnormalities, mild LVH, grade 1 diastolic dysfunction, normal RV systolic function and ventricular cavity size, no significant valvular abnormalities, borderline dilatation of the aortic root measuring 39 mm, and an estimated right atrial pressure of 3 mmHg.  Imdur was titrated to 60 mg daily.  He noted improvement in symptoms and outpatient ischemic testing was recommended.  However, he returned to the hospital the same day he was discharged (12/09/2021) with recurrent unstable angina and a troponin of 28.  LHC  demonstrated severe native vessel CAD as outlined below with 2 of 3 grafts patent (LIMA to LAD and SVG to OM).  There was a flush occlusion of the SVG to ramus.  The culprit lesion was a 95% focal (napkin ring) proximal RCA stenosis.  He underwent successful scoring balloon angioplasty followed by DES with 0% residual stenosis.  He was seen in hospital follow-up on 12/15/2021 and was without symptoms of angina or decompensation.  He had not missed any doses of DAPT.  He did note some mild fatigue.  He was adherent to CPAP on some nights.  He was without arteriotomy site complications.  He did report some chills the first night of his hospital discharge, though these had resolved.  Toprol-XL was decreased to 25 mg daily.  Following my visit with him on 6/13, he proceeded to the lab for recommended venipuncture.  During blood draw, he became flushed and suffered a syncopal episode.  BP in the ED was soft at 96/71 with a heart rate of 77 bpm, respirations 20, and O2 saturation of 92%.  High-sensitivity troponin 116 downtrending to 107 and felt to be in the setting of his recent PCI to the RCA.  Repeat labs again confirmed mild and stable hyponatremia and hypokalemia.  Glucose 140.  Renal function stable and normal.  WBC normal.  Hemoglobin low, though stable at 12.7.  Repeat EKG showed sinus rhythm with a first-degree AV block and nonspecific ST-T changes with significant baseline artifact.  He was discharged to close outpatient follow-up.  Cardiology result note on labs we  drew (prior to knowing that the patient was in the ED), recommended he start low-dose KCl to replete potassium.  He comes in today and is without symptoms of angina or decompensation.  He has not had any further syncopal episodes.  He reports no prior history of syncope with venipuncture.  He was without symptoms of angina, dyspnea, or palpitations leading up or following his syncopal episode.  He describes this syncopal episode as gradual  with associated flushing.  No loss of bowel or bladder function.  No seizure-like activity.  He has not missed any doses of DAPT and denies any bleeding concerns.  He reports adherence to all medications.  He indicates he drinks approximately 2.5 gallons of water per day.  No significant lower extremity swelling.  He does note some randomly occurring migratory left and right-sided chest cramping that will last for seconds at a time and has been present since his bypass.  Currently asymptomatic.   Labs independently reviewed: 12/2021 - Hgb 12.7, PLT 191, sodium 128, potassium 3.8, BUN 14, serum creatinine 0.92 12/2021 - LPa 236, TC 88, TG 63, HDL 34, LDL 41, TSH normal, albumin 4.1, AST/ALT normal 07/2021 - A1c 6.4  Past Medical History:  Diagnosis Date   CAD (coronary artery disease)    a. 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 cardiac cath 01/09/2015: ostLAD 70:, mLAD 99%, ost to pLCx 95%, mLCx 80%, LVEF nl, recommend CABGl; c. s/p 3v CABG 01/14/15 LIMA-LAD, SVG-OM, SVG-Ramus   History of echocardiogram    a. 01/2015 Echo: EF 60-65%, no rwma, nl RV fxn. Nl PASP.   Hyperlipidemia LDL goal <70    Hypertension    Morbid obesity (HCC)    OSA (obstructive sleep apnea)    Type II diabetes mellitus (HCC)     Past Surgical History:  Procedure Laterality Date   CARDIAC CATHETERIZATION N/A 01/09/2015   Procedure: Left Heart Cath and Coronary Angiography;  Surgeon: Antonieta Iba, MD;  Location: [facility] INVASIVE CV LAB;  Service: Cardiovascular;  Laterality: N/A;   CORONARY ARTERY BYPASS GRAFT N/A 01/14/2015   Procedure: CORONARY ARTERY BYPASS GRAFTING (CABG), ON PUMP, TIMES THREE, USING LEFT INTERNAL MAMMARY ARTERY, RIGHT GREATER SAPHENOUS VEIN HARVESTED ENDOSCOPICALLY;  Surgeon: Kerin Perna, MD;  Location: [facility]  OR;  Service: Open Heart Surgery;  Laterality: N/A;   CORONARY STENT INTERVENTION N/A 12/10/2021   Procedure: CORONARY STENT INTERVENTION;  Surgeon: Marykay Lex, MD;  Location: [facility] INVASIVE CV LAB;  Service: Cardiovascular;  Laterality: N/A;   HERNIA REPAIR     LEFT HEART CATH AND CORONARY ANGIOGRAPHY N/A 12/10/2021   Procedure: LEFT HEART CATH AND CORONARY ANGIOGRAPHY;  Surgeon: Marykay Lex, MD;  Location: [facility] INVASIVE CV LAB;  Service: Cardiovascular;  Laterality: N/A;   TEE WITHOUT CARDIOVERSION N/A 01/14/2015   Procedure: TRANSESOPHAGEAL ECHOCARDIOGRAM (TEE);  Surgeon: Kerin Perna, MD;  Location: [facility] OR;  Service: Open Heart Surgery;  Laterality: N/A;    Current Medications: No outpatient medications have been marked as taking for the 12/17/21 encounter (Office Visit) with Sondra Barges, PA-C.    Allergies:   Trulicity [dulaglutide]   Social History   Socioeconomic History   Marital status: Single    Spouse name: Not on file   Number of children: Not on file   Years of education:  Not on file   Highest education level: Not on file  Occupational History   Occupation: Former Naval architect / Production designer, theatre/television/film  Tobacco Use   Smoking status: Former    Types: Cigarettes    Quit date: 08/06/1975    Years since quitting: 46.3   Smokeless tobacco: Never  Vaping Use   Vaping Use: Never used  Substance and Sexual Activity   Alcohol use: No   Drug use: No   Sexual activity: Not on file  Other Topics Concern   Not on file  Social History Narrative   Not on file   Social Determinants of Health   Financial Resource Strain: Low Risk  (05/20/2021)   Overall Financial Resource Strain (CARDIA)    Difficulty of Paying Living Expenses: Not hard at all  Food Insecurity: No Food Insecurity (05/20/2021)   Hunger Vital Sign    Worried About Running Out of Food in the Last Year: Never true    Ran Out of Food in the Last Year: Never true  Transportation Needs: No Transportation  Needs (05/20/2021)   PRAPARE - Administrator, Civil Service (Medical): No    Lack of Transportation (Non-Medical): No  Physical Activity: Sufficiently Active (05/20/2021)   Exercise Vital Sign    Days of Exercise per Week: 4 days    Minutes of Exercise per Session: 40 min  Stress: No Stress Concern Present (05/20/2021)   [employer] of Occupational Health - Occupational Stress Questionnaire    Feeling of Stress : Not at all  Social Connections: Socially Isolated (05/20/2021)   Social Connection and Isolation Panel [NHANES]    Frequency of Communication with Friends and Family: Three times a week    Frequency of Social Gatherings with Friends and Family: Three times a week    Attends Religious Services: Never    Active Member of Clubs or Organizations: No    Attends Banker Meetings: Never    Marital Status: Never married     Family History:  The patient's family history includes CAD in his brother and mother; Throat cancer in his father. There is no history of Diabetes, Cancer, Prostate cancer, or Colon cancer.  ROS:   12-point review of systems is negative unless otherwise noted in the HPI.   EKGs/Labs/Other Studies Reviewed:    Studies reviewed were summarized above. The additional studies were reviewed today:  LHC 12/10/2021:   -----------------LEFT CORONARY---------------------------   Mid LM to Dist LM lesion is 15% stenosed with 25% stenosed side branch in Ramus.   Ost LAD to Prox LAD lesion is 70% stenosed.  Prox LAD to Mid LAD lesion is 100% stenosed.   Ost Cx to Prox Cx lesion is 100% stenosed.   Prox Cx to Mid Cx lesion is 80% stenosed.   -----------------GRAFTS---------------------------   LIMA graft was visualized by angiography and is large.  The graft exhibits no disease.  There is no competitive flow   SVG-OM graft was visualized by angiography. The graft exhibits no disease.   SVG-RI gaft was visulalized by angiograph => Origin to  Prox Graft lesion is 100% stenosed.   ------------------------------------------------------   -----------------RIGHT CORONARY---------------------------   CULPRIT LESION: Prox RCA lesion is 95% stenosed.   Balloon angioplasty was performed using sequential BALLN SCOREFLEX Balloons 3.0 x 10 & 3.50X10.   A drug-eluting stent was successfully placed using a STENT ONYX FRONTIER 3.5X15 -> postdilated to 4.1 mm   Post intervention, there is a 0% residual stenosis.   ------------------------------------------------------  The left ventricular systolic function is normal.   LV end diastolic pressure is normal.   SUMMARY Severe Native CAD:  100% CTO of proximal LCx after OM1/RI, 90% followed 100% CTO of LAD after SP1;  Widely patent ostial RI CULPRIT LESION 95% focal (napkin ring) proximal RCA Successful scoring balloon (sequential 3.0 mm & 3.5 mm ScoreFlex) angioplasty followed by => DES PCI of proximal RCA Onyx Frontier DES 3.5 mm x 15 mm postdilated to 4.1 mm. 95% reduced to 0% stenosis; TIMI-3 flow pre and post.   Distal LAD has tandem 50% lesions, distal LCx with retrograde flow reveals 80% stenosis prior to anastomosis. 2 of 3 grafts patent: SVG-OM, LIMA-LAD with flush occlusion of SVG-RI NORMAL LVEDP.  LV gram not performed-Echo done today.     PLAN OF CARE:  Monitor overnight post PCI, continue aggressive cardiovascular risk factor modification with guideline medical therapy.  Anticipate morning discharge.  Phase 2 cardiac rehab ordered __________   2D echo 12/08/2021: 1. Left ventricular ejection fraction, by estimation, is 60 to 65%. The  left ventricle has normal function. The left ventricle has no regional  wall motion abnormalities. There is mild left ventricular hypertrophy.  Left ventricular diastolic parameters  are consistent with Grade I diastolic dysfunction (impaired relaxation).   2. Right ventricular systolic function is normal. The right ventricular  size is normal.    3. The mitral valve is grossly normal. No evidence of mitral valve  regurgitation.   4. The aortic valve was not well visualized. Aortic valve regurgitation  is not visualized.   5. Aortic dilatation noted. There is borderline dilatation of the aortic  root, measuring 39 mm.   6. The inferior vena cava is normal in size with greater than 50%  respiratory variability, suggesting right atrial pressure of 3 mmHg. __________   LHC 01/09/2015: Ost Cx to Prox Cx lesion, 95% stenosed. Ost LAD lesion, 70% stenosed. The left ventricular systolic function is normal. Mid Cx lesion, 80% stenosed. Mid LAD lesion, 99% stenosed.   Right dominant coronary arterial system Severe two-vessel disease: Ostial LAD estimated at 70-80%, eccentric calcification, LAD is a large vessel Also with critical proximal LAD lesion estimated at 95-99%. Proximal circumflex with severe 95% lesion after the takeoff of OM1, long, calcified, circumflex is a large vessel Mid circumflex also with 80% lesion, focal, prior to the takeoff of OM 2 Right coronary artery with mild diffuse disease   Case was discussed with Dr. Kirke Corin. Given his unstable angina, severity of the proximal LAD lesion, presence of a calcified ostial LAD stenosis, as well as multiple regions of stenosis requiring intervention, it was suggested he be evaluated for possible bypass surgery. This would likely provide him a better long-term solution to his diffuse 2 vessel CAD.  Given his unstable angina, degree of stenosis, he will be transferred to [facility] today.    Results were discussed with the patient __________   Treadmill MPI 01/07/2015: This is a HIGH RISK study. Defect 1: There is a large defect of moderate severity present in the mid anterior, mid anteroseptal, mid anterolateral, apical anterior, apical inferior, apical lateral and apex location. Findings consistent with ischemia and prior myocardial infarction with peri-infarct  ischemia. Upsloping ST segment depression ST segment depression was noted during stress in the inferolateral leads (II, III, aVF, V5 and V6), and returning to baseline after 5-9 minutes of recovery. Blood pressure demonstrated a hypertensive response to exercise with Poor Exercise tolerance. The left ventricular ejection fraction is  low normal - estimated EF ~53% (45-54%).   HIGH RISK Test due to Poor Exercise Tolerance, Large-partially reversible perfusion defect in the likely LAD distribution.   Would consider Cardiac Catheterization. __________   2D echo 01/06/2015: - Left ventricle: The cavity size was normal. Systolic function was    normal. The estimated ejection fraction was in the range of 60%    to 65%. Wall motion was normal; there were no regional wall    motion abnormalities. Left ventricular diastolic function    parameters were normal.  - Aortic valve: Valve area (Vmax): 4.8 cm^2.  - Left atrium: The atrium was normal in size.  - Right ventricle: Systolic function was normal.  - Pulmonary arteries: Systolic pressure was within the normal    range.   Impressions:   - Normal study.   EKG:  EKG is ordered today.  The EKG ordered today demonstrates NSR, 88 bpm, first-degree AV block, nonspecific inferolateral ST-T changes consistent with prior RCA territory ischemia  Recent Labs: 12/07/2021: ALT 43 12/08/2021: TSH 0.888 12/15/2021: Hemoglobin 12.7; Platelets 191 12/16/2021: BUN 14; Creatinine, Ser 0.92; Potassium 3.8; Sodium 128  Recent Lipid Panel    Component Value Date/Time   CHOL 88 12/08/2021 1243   CHOL 158 06/09/2016 1202   TRIG 63 12/08/2021 1243   HDL 34 (L) 12/08/2021 1243   HDL 46 06/09/2016 1202   CHOLHDL 2.6 12/08/2021 1243   VLDL 13 12/08/2021 1243   LDLCALC 41 12/08/2021 1243   LDLCALC 39 07/23/2021 0845    PHYSICAL EXAM:    VS:  BP 102/60   Pulse 88   Ht 5\' 8"  (1.727 m)   Wt 299 lb 3.2 oz (135.7 kg)   SpO2 95%   BMI 45.49 kg/m   BMI: Body mass  index is 45.49 kg/m.  Physical Exam Vitals reviewed.  Constitutional:      Appearance: He is well-developed.  HENT:     Head: Normocephalic and atraumatic.  Eyes:     General:        Right eye: No discharge.        Left eye: No discharge.  Neck:     Vascular: No JVD.  Cardiovascular:     Rate and Rhythm: Normal rate and regular rhythm.     Pulses:          Posterior tibial pulses are 2+ on the right side and 2+ on the left side.     Heart sounds: Normal heart sounds, S1 normal and S2 normal. Heart sounds not distant. No midsystolic click and no opening snap. No murmur heard.    No friction rub.     Comments: Clean bandages are noted along the left radial arteriotomy site with 2+ radial pulse proximal and distal to the arteriotomy. Pulmonary:     Effort: Pulmonary effort is normal. No respiratory distress.     Breath sounds: Normal breath sounds. No decreased breath sounds, wheezing or rales.  Chest:     Chest wall: No tenderness.  Abdominal:     General: There is no distension.     Palpations: Abdomen is soft.     Tenderness: There is no abdominal tenderness.  Musculoskeletal:     Cervical back: Normal range of motion.     Right lower leg: No edema.     Left lower leg: No edema.  Skin:    General: Skin is warm and dry.     Nails: There is no clubbing.  Neurological:  Mental Status: He is alert and oriented to person, place, and time.  Psychiatric:        Speech: Speech normal.        Behavior: Behavior normal.        Thought Content: Thought content normal.        Judgment: Judgment normal.     Wt Readings from Last 3 Encounters:  12/17/21 299 lb 3.2 oz (135.7 kg)  12/15/21 298 lb (135.2 kg)  12/15/21 298 lb 6.4 oz (135.4 kg)     ASSESSMENT & PLAN:   Syncope: Felt to be vasovagal in etiology in the context of venipuncture.  No symptoms of angina or decompensation in the perisyncopal timeframe.  Place ZIO AT to evaluate for significant arrhythmia, prolonged  pauses, or high-grade AV block.  He no longer drives.  CAD involving the native coronary artery status post CABG status post PCI without angina: He is doing well without symptoms concerning for angina or decompensation.  Continue DAPT with aspirin 81 mg and clopidogrel 75 mg without interruption for a minimum of 6 to 12 months from date of PCI.  Continue aggressive risk factor modification and secondary prevention including atorvastatin, ezetimibe, isosorbide mononitrate, lisinopril, and metoprolol succinate.  Post-cath instructions previously discussed at his visit earlier this week.  Cardiac rehab is recommended, and he is cleared to participate from our perspective.  HTN: Blood pressure is well controlled to on the soft side.  Toprol-XL was previously decreased.  We are holding HCTZ as outlined below.  He will otherwise continue current doses of isosorbide mononitrate and lisinopril.  HLD: LDL 41 with a triglyceride of 63.  He remains on atorvastatin and ezetimibe.  Postoperative A-fib: No documented evidence of recurrence of arrhythmia.  Hyponatremia: Discontinue HCTZ.  Fluid restrict as he indicates he drinks 2.5 gallons of water daily.  Recheck BMP in 2 weeks.  Hypokalemia: Repleted on updated labs.  We will discontinue KCl at this time given repletion of potassium and in the context of discontinuation of HCTZ.  Obesity with sleep apnea: Continued weight loss is encouraged through out of the diet and regular exercise.  Recommend nightly CPAP adherence.  First-degree AV block: Stable.  Toprol-XL was recently decreased at his visit 48 hours ago.  Zio patch as outlined above.  Anemia: Stable.    Disposition: F/u with Dr. Mariah Milling or an APP in 4 to 6 weeks.   Medication Adjustments/Labs and Tests Ordered: Current medicines are reviewed at length with the patient today.  Concerns regarding medicines are outlined above. Medication changes, Labs and Tests ordered today are summarized above and  listed in the Patient Instructions accessible in Encounters.   Signed, Eula Listen, PA-C 12/17/2021 10:07 AM     [facility] [place] [address] [place], [place] [address] [phone]

## 2021-12-15 NOTE — Telephone Encounter (Signed)
I was contacted by ED re: this patient, who was seen in our office this AM.  He had a syncopal episode accompanied by diaphoresis while getting his blood drawn.  He had been feeling well and is reportedly back to baseline per EDP.  HS-TnI is slightly elevated but downtrending, likely related to his recent hospitalization for unstable angina and PCI to the RCA.  EKG shows nonspecific ST/T changes.  I have recommended having the patient ambulate to make sure he feels okay.  Description of his syncope is most consistent with a vasovagal episode.  We will plan to have him return later this week for reevaluation and repeat BMP to make sure his hyponatremia and hypokalemia are not worsening.  Yvonne Kendall, MD [facility]

## 2021-12-15 NOTE — Telephone Encounter (Signed)
Left message for pt to call back  °

## 2021-12-15 NOTE — Telephone Encounter (Signed)
Transition Care Management Follow-up Telephone Call Date of discharge and from where: 12/11/2021 from [facility] How have you been since you were released from the hospital? Patient stated that he is feeling better and did not have any questions or concerns at this time.  Any questions or concerns? No  Items Reviewed: Did the pt receive and understand the discharge instructions provided? Yes  Medications obtained and verified? Yes  Other? No  Any new allergies since your discharge? No  Dietary orders reviewed? No Do you have support at home? Yes   Functional Questionnaire: (I = Independent and D = Dependent) ADLs: I  Bathing/Dressing- I  Meal Prep- I  Eating- I  Maintaining continence- I  Transferring/Ambulation- I  Managing Meds- I   Follow up appointments reviewed:  PCP Hospital f/u appt confirmed? Yes  Scheduled to see Saralyn Pilar, DO on 12/22/2021 @ 1:40pm. Specialist Hospital f/u appt confirmed? Yes  Scheduled to see Eula Listen, PA-C on 12/17/2021 @ 8:50am. Are transportation arrangements needed? No - but stated that transportation may be an issue this week due to amount of time from when the appt was scheduled and arranging a ride to the appt.  If their condition worsens, is the pt aware to call PCP or go to the Emergency Dept.? Yes Was the patient provided with contact information for the PCP's office or ED? Yes Was to pt encouraged to call back with questions or concerns? Yes

## 2021-12-15 NOTE — Telephone Encounter (Signed)
Spoke w/ Craig Harvey.  Advised him of Dr. Serita Kyle recommendation. Advised him that he will receive a call from scheduling, but I will place order for him to have BMET drawn the day before his appt.   He is appreciative of the call.

## 2021-12-15 NOTE — Telephone Encounter (Signed)
Scheduled

## 2021-12-15 NOTE — Discharge Instructions (Signed)
Please seek medical attention for any high fevers, chest pain, shortness of breath, change in behavior, persistent vomiting, bloody stool or any other new or concerning symptoms.  

## 2021-12-15 NOTE — Patient Instructions (Signed)
Medication Instructions:   Your physician has recommended you make the following change in your medication:   DECREASE metoprolol succinate 25 mg daily   *If you need a refill on your cardiac medications before your next appointment, please call your pharmacy*   Lab Work:  Today at the medical mall at [facility]: CBC, BMET, Iron, Ferritin  -  Please go to the [employer] [facility] [place] in at the Registration Desk: 1st desk to the right, past the screening table   If you have labs (blood work) drawn today and your tests are completely normal, you will receive your results only by: [place] (if you have MyChart) OR A paper copy in the mail If you have any lab test that is abnormal or we need to change your treatment, we will call you to review the results.   Testing/Procedures:  None ordered    Follow-Up: At [facility], you and your health needs are our priority.  As part of our continuing mission to provide you with exceptional heart care, we have created designated Provider Care Teams.  These Care Teams include your primary Cardiologist (physician) and Advanced Practice Providers (APPs -  Physician Assistants and Nurse Practitioners) who all work together to provide you with the care you need, when you need it.  We recommend signing up for the patient portal called "MyChart".  Sign up information is provided on this After Visit Summary.  MyChart is used to connect with patients for Virtual Visits (Telemedicine).  Patients are able to view lab/test results, encounter notes, upcoming appointments, etc.  Non-urgent messages can be sent to your provider as well.   To learn more about what you can do with MyChart, go to [email or web address].    Your next appointment:   2 month(s)  The format for your next appointment:   In Person  Provider:   Christell Faith, PA-C{   Important Information About Sugar

## 2021-12-15 NOTE — ED Triage Notes (Signed)
Patient arrived after episode of LOC at doctors office. Reports after getting blood drawn patient felt very hot and episode happened. Recently had stents placed X1 week ago. Denies pain at this time

## 2021-12-15 NOTE — Telephone Encounter (Signed)
Where can we overbook for gollan or ryan.

## 2021-12-15 NOTE — ED Provider Notes (Signed)
[facility] Provider Note    Event Date/Time   First MD Initiated Contact with Patient 12/15/21 [phone]     (approximate)   History   Loss of Consciousness   HPI  Craig Harvey is a 66 y.o. male  who, per discharge summary dated 4 days ago had admission for ACS with stent placement, who presents to the emergency department today because of concerns for a syncopal episode.  The patient did have a follow-up appointment with his cardiologist earlier today.  When he then went to get blood work drawn he said he felt very hot and then passed out.  He denied any chest pain when he passed out.  He states he is feeling better at the time my exam.  Physical Exam   Triage Vital Signs: ED Triage Vitals  Enc Vitals Group     BP 12/15/21 0918 96/71     Pulse Rate 12/15/21 0918 77     Resp 12/15/21 0918 20     Temp 12/15/21 0918 97.7 F (36.5 C)     Temp Source 12/15/21 0918 Oral     SpO2 12/15/21 0918 92 %     Weight 12/15/21 0919 298 lb (135.2 kg)     Height 12/15/21 0919 5\' 8"  (1.727 m)     Head Circumference --      Peak Flow --      Pain Score 12/15/21 0919 0     Pain Loc --      Pain Edu? --      Excl. in GC? --     Most recent vital signs: Vitals:   12/15/21 0918  BP: 96/71  Pulse: 77  Resp: 20  Temp: 97.7 F (36.5 C)  SpO2: 92%   General: Awake, alert, oriented. CV:  Good peripheral perfusion. Regular rate and rhythm. Resp:  Normal effort. Lungs clear. Abd:  No distention.   ED Results / Procedures / Treatments   Labs (all labs ordered are listed, but only abnormal results are displayed) Labs Reviewed  BASIC METABOLIC PANEL - Abnormal; Notable for the following components:      Result Value   Sodium 129 (*)    Potassium 3.4 (*)    Chloride 97 (*)    Glucose, Bld 140 (*)    All other components within normal limits  CBC - Abnormal; Notable for the following components:   Hemoglobin 12.7 (*)    HCT 38.6 (*)    All other components  within normal limits  CBG MONITORING, ED - Abnormal; Notable for the following components:   Glucose-Capillary 144 (*)    All other components within normal limits  TROPONIN I (HIGH SENSITIVITY) - Abnormal; Notable for the following components:   Troponin I (High Sensitivity) 116 (*)    All other components within normal limits  TROPONIN I (HIGH SENSITIVITY) - Abnormal; Notable for the following components:   Troponin I (High Sensitivity) 107 (*)    All other components within normal limits  URINALYSIS, ROUTINE W REFLEX MICROSCOPIC     EKG  I, 12/17/21, attending physician, personally viewed and interpreted this EKG  EKG Time: 0929 Rate: 71 Rhythm: sinus rhythm Axis: normal Intervals: qtc 469 QRS: narrow, q waves v1 ST changes: no st elevation Impression: abnormal ekg   RADIOLOGY None  PROCEDURES:  Critical Care performed: No  Procedures   MEDICATIONS ORDERED IN ED: Medications - No data to display   IMPRESSION / MDM / ASSESSMENT AND PLAN /  ED COURSE  I reviewed the triage vital signs and the nursing notes.                              Differential diagnosis includes, but is not limited to, anemia, arrhythmia, ACS, vasovagal.   Patient's presentation is most consistent with acute presentation with potential threat to life or bodily function.  Patient presented to the emergency department today because of concerns for a syncopal episode that occurred after he had blood drawn.  Patient's blood work here did show an initial elevation of the troponin.  However did recently have catheterization with stent placement.  Second troponin was decreased.  I do think troponin elevation could be secondary from stent placement.  Patient was found to be hyponatremic however he has what appears to become chronic hyponatremia.  Patient did feel better during his stay here in the emergency department.  Discussed briefly with Dr. Okey Dupre with cardiology.  Will try to arrange  follow-up for patient in clinic.  FINAL CLINICAL IMPRESSION(S) / ED DIAGNOSES   Final diagnoses:  Syncope, unspecified syncope type  Hyponatremia      Note:  This document was prepared using Dragon voice recognition software and may include unintentional dictation errors.    Phineas Semen, MD 12/15/21 1451

## 2021-12-15 NOTE — Progress Notes (Signed)
[facility]  Date: 12/15/21  Time: 2:02 PM  Patient evaluated in the ED. he reports having felt well other than some generalized fatigue until his syncopal episode in phlebotomy today.  He currently feels back to his baseline other than some persistent fatigue.  He still notes sporadic tightness in his chest though it is different than what he experienced before recent PCI to the RCA.  Telemetry shows brief sinus pauses.  Few episodes have questionable nonconducted P waves though artifact and low voltage limit evaluation.  On further questioning, patient reports that he was "dozing" at the time that pauses were noted.  I suspect this could be related to his underlying sleep apnea.  I agree with de-escalation of metoprolol, as recommended at today's office visit with Eula Listen, PA.  We will arrange for him to follow-up in the office later this week for clinical reassessment and repeat BMP.  It may be worthwhile to consider placement of an event monitor at that time to ensure that he does not have any significant underlying conduction disease contributing to his syncope, though circumstances are still most consistent with a vasovagal episode.  He is awaiting discharge from the ED at this time.  Yvonne Kendall, MD [facility]

## 2021-12-15 NOTE — Telephone Encounter (Signed)
-----   Message from Sondra Barges, PA-C sent at 12/15/2021 10:38 AM EDT ----- Please inform the patient his renal function remains normal.  His sodium remains mildly low, though this appears to be a chronic issue for him.  His potassium is mildly low.  His blood count remains mildly low, though stable and consistent with prior readings.  His iron level is elevated with a low ferritin noted.  Recommendations: -Follow-up with PCP as directed for ongoing management of his chronic anemia and hyponatremia -Start KCl 10 mEq daily -Recheck BMP in 1 to 2 weeks

## 2021-12-15 NOTE — Telephone Encounter (Signed)
Patient is calling to see if appt on 6/15 is necessary and he thought he had appt on Friday. Please advise

## 2021-12-15 NOTE — Telephone Encounter (Signed)
Spoke w/ pt and reviewed Ryan's recommendations. He would like a 90 day supply of KCl sent in.  He will keep upcoming appts and call back w/ any questions.

## 2021-12-15 NOTE — ED Notes (Signed)
Date and time results received: 12/15/21 1123 (use smartphrase ".now" to insert current time)  Test: Trop Critical Value: 116  Name of Provider Notified: Archie Balboa

## 2021-12-16 ENCOUNTER — Telehealth: Payer: Self-pay | Admitting: *Deleted

## 2021-12-16 ENCOUNTER — Other Ambulatory Visit
Admission: RE | Admit: 2021-12-16 | Discharge: 2021-12-16 | Disposition: A | Discharge status: Home / Self Care | Payer: Medicaid Other | Attending: Internal Medicine | Admitting: Internal Medicine

## 2021-12-16 DIAGNOSIS — E876 Hypokalemia: Secondary | ICD-10-CM | POA: Diagnosis present

## 2021-12-16 DIAGNOSIS — E871 Hypo-osmolality and hyponatremia: Secondary | ICD-10-CM | POA: Diagnosis present

## 2021-12-16 LAB — BASIC METABOLIC PANEL
Anion gap: 10 (ref 5–15)
BUN: 14 mg/dL (ref 8–23)
CO2: 23 mmol/L (ref 22–32)
Calcium: 9.4 mg/dL (ref 8.9–10.3)
Chloride: 95 mmol/L — ABNORMAL LOW (ref 98–111)
Creatinine, Ser: 0.92 mg/dL (ref 0.61–1.24)
GFR, Estimated: >60 mL/min (ref >60)
Glucose, Bld: 108 mg/dL — ABNORMAL HIGH (ref 70–99)
Potassium: 3.8 mmol/L (ref 3.5–5.1)
Sodium: 128 mmol/L — ABNORMAL LOW (ref 135–145)

## 2021-12-16 NOTE — Telephone Encounter (Signed)
Spoke with pt. Notified that it was advised he does keep appointment that was scheduled for 6/15 d/t episode that occurred yesterday and per Dr. Serita Kyle recc. Pt made aware lab orders placed to have completed today at medical mall.  Pt voiced he has some transportation concerns, but he is going to try to find transportation for appointment and will let us know of any issues.   Pt has no further questions at this time.

## 2021-12-16 NOTE — Telephone Encounter (Signed)
-----   Message from Ryan M Dunn, PA-C sent at 12/15/2021 10:38 AM EDT ----- Please inform the patient his renal function remains normal.  His sodium remains mildly low, though this appears to be a chronic issue for him.  His potassium is mildly low.  His blood count remains mildly low, though stable and consistent with prior readings.  His iron level is elevated with a low ferritin noted.  Recommendations: -Follow-up with PCP as directed for ongoing management of his chronic anemia and hyponatremia -Start KCl 10 mEq daily -Recheck BMP in 1 to 2 weeks  

## 2021-12-16 NOTE — Telephone Encounter (Signed)
Reviewed results and recommendations with patient and he prefers to wait until his appointment tomorrow to discuss with provider at that visit.

## 2021-12-17 ENCOUNTER — Encounter: Payer: Self-pay | Admitting: Physician Assistant

## 2021-12-17 ENCOUNTER — Ambulatory Visit (INDEPENDENT_AMBULATORY_CARE_PROVIDER_SITE_OTHER): Payer: Medicaid Other | Admitting: Physician Assistant

## 2021-12-17 ENCOUNTER — Ambulatory Visit (INDEPENDENT_AMBULATORY_CARE_PROVIDER_SITE_OTHER): Payer: Medicaid Other

## 2021-12-17 VITALS — BP 102/60 | HR 88 | Ht 68.0 in | Wt 299.2 lb

## 2021-12-17 DIAGNOSIS — E785 Hyperlipidemia, unspecified: Secondary | ICD-10-CM | POA: Diagnosis not present

## 2021-12-17 DIAGNOSIS — G4733 Obstructive sleep apnea (adult) (pediatric): Secondary | ICD-10-CM

## 2021-12-17 DIAGNOSIS — E871 Hypo-osmolality and hyponatremia: Secondary | ICD-10-CM | POA: Diagnosis not present

## 2021-12-17 DIAGNOSIS — E876 Hypokalemia: Secondary | ICD-10-CM | POA: Diagnosis not present

## 2021-12-17 DIAGNOSIS — R55 Syncope and collapse: Secondary | ICD-10-CM | POA: Diagnosis not present

## 2021-12-17 DIAGNOSIS — D649 Anemia, unspecified: Secondary | ICD-10-CM

## 2021-12-17 DIAGNOSIS — I2510 Atherosclerotic heart disease of native coronary artery without angina pectoris: Secondary | ICD-10-CM

## 2021-12-17 DIAGNOSIS — I10 Essential (primary) hypertension: Secondary | ICD-10-CM | POA: Diagnosis not present

## 2021-12-17 DIAGNOSIS — I4891 Unspecified atrial fibrillation: Secondary | ICD-10-CM

## 2021-12-17 DIAGNOSIS — Z6841 Body Mass Index (BMI) 40.0 and over, adult: Secondary | ICD-10-CM

## 2021-12-17 DIAGNOSIS — I440 Atrioventricular block, first degree: Secondary | ICD-10-CM

## 2021-12-17 DIAGNOSIS — I9789 Other postprocedural complications and disorders of the circulatory system, not elsewhere classified: Secondary | ICD-10-CM | POA: Diagnosis not present

## 2021-12-17 DIAGNOSIS — Z951 Presence of aortocoronary bypass graft: Secondary | ICD-10-CM

## 2021-12-17 NOTE — Patient Instructions (Signed)
Medication Instructions:  Your physician has recommended you make the following change in your medication:   STOP Hydrochlorothiazide STOP Potassium   *If you need a refill on your cardiac medications before your next appointment, please call your pharmacy*   Lab Work: BMET in 2 weeks over at the [employer] at [facility] Then check in at the 1st desk on the right to check in (REGISTRATION)  Lab hours: Monday- Friday (7:30 am- 5:30 pm)  If you have labs (blood work) drawn today and your tests are completely normal, you will receive your results only by: MyChart Message (if you have MyChart) OR A paper copy in the mail If you have any lab test that is abnormal or we need to change your treatment, we will call you to review the results.   Testing/Procedures: Your provider has ordered a heart monitor to wear for 14 days. This will be mailed to your home with instructions on placement. Once you have finished the time frame requested, you will return monitor in box provided.     Follow-Up: At [facility], you and your health needs are our priority.  As part of our continuing mission to provide you with exceptional heart care, we have created designated Provider Care Teams.  These Care Teams include your primary Cardiologist (physician) and Advanced Practice Providers (APPs -  Physician Assistants and Nurse Practitioners) who all work together to provide you with the care you need, when you need it.  We recommend signing up for the patient portal called "MyChart".  Sign up information is provided on this After Visit Summary.  MyChart is used to connect with patients for Virtual Visits (Telemedicine).  Patients are able to view lab/test results, encounter notes, upcoming appointments, etc.  Non-urgent messages can be sent to your provider as well.   To learn more about what you can do with MyChart, go to [email or web address].    Your next appointment:   6 week(s)  The format  for your next appointment:   In Person  Provider:   Julien Nordmann, MD or Eula Listen, PA-C    Other Instructions DECREASE Fluid intake   Important Information About Sugar    .pam

## 2021-12-19 DIAGNOSIS — R55 Syncope and collapse: Secondary | ICD-10-CM

## 2021-12-22 ENCOUNTER — Ambulatory Visit (INDEPENDENT_AMBULATORY_CARE_PROVIDER_SITE_OTHER): Payer: Medicaid Other | Admitting: Family Medicine

## 2021-12-22 ENCOUNTER — Encounter: Payer: Self-pay | Admitting: Family Medicine

## 2021-12-22 VITALS — BP 125/74 | HR 90 | Ht 68.0 in | Wt 307.0 lb

## 2021-12-22 DIAGNOSIS — I25118 Atherosclerotic heart disease of native coronary artery with other forms of angina pectoris: Secondary | ICD-10-CM | POA: Diagnosis not present

## 2021-12-22 DIAGNOSIS — I10 Essential (primary) hypertension: Secondary | ICD-10-CM | POA: Diagnosis not present

## 2021-12-22 DIAGNOSIS — G4733 Obstructive sleep apnea (adult) (pediatric): Secondary | ICD-10-CM | POA: Diagnosis not present

## 2021-12-22 DIAGNOSIS — I200 Unstable angina: Secondary | ICD-10-CM

## 2021-12-22 DIAGNOSIS — E1151 Type 2 diabetes mellitus with diabetic peripheral angiopathy without gangrene: Secondary | ICD-10-CM | POA: Diagnosis not present

## 2021-12-22 DIAGNOSIS — Z9989 Dependence on other enabling machines and devices: Secondary | ICD-10-CM | POA: Diagnosis not present

## 2021-12-22 LAB — POCT GLYCOSYLATED HEMOGLOBIN (HGB A1C): Hemoglobin A1C: 6.2 % — AB (ref 4.0–5.6)

## 2021-12-22 NOTE — Patient Instructions (Addendum)
Thank you for coming to the office today.  Recent Labs    01/19/21 1351 07/23/21 0845 12/22/21 1401  HGBA1C 6.6* 6.4* 6.2*   Keep on current medications. No changes today.  I agree with the discontinuation of the Thiazide HCTZ and Potassium.  DUE for FASTING BLOOD WORK (no food or drink after midnight before the lab appointment, only water or coffee without cream/sugar on the morning of)  SCHEDULE "Lab Only" visit in the morning at the clinic for lab draw in 7 MONTHS   - Make sure Lab Only appointment is at about 1 week before your next appointment, so that results will be available  For Lab Results, once available within 2-3 days of blood draw, you can can log in to MyChart online to view your results and a brief explanation. Also, we can discuss results at next follow-up visit.   Please schedule a Follow-up Appointment to: Return in about 7 months (around 07/24/2022) for 7 month fasting lab then 1 week later Annual Physical.  If you have any other questions or concerns, please feel free to call the office or send a message through MyChart. You may also schedule an earlier appointment if necessary.  Additionally, you may be receiving a survey about your experience at our office within a few days to 1 week by e-mail or mail. We value your feedback.  Saralyn Pilar, DO [facility], [place]

## 2021-12-22 NOTE — Assessment & Plan Note (Signed)
Well controlled, chronic OSA on CPAP, now >10 years - Good adherence to CPAP nightly - Continue current CPAP therapy, patient seems to be benefiting from therapy 

## 2021-12-22 NOTE — Progress Notes (Signed)
Subjective:    Patient ID: Craig Harvey, male    DOB: 04-04-1956, 66 y.o.   MRN: [id number]  Craig Harvey is a 66 y.o. male presenting on 12/22/2021 for Diabetes and Hypertension   HPI    CHRONIC DM, Type 2: Failed Trulicity due to side effect intolerance Improved CBGs 90-150 range Meds: Metformin 1000mg  BID, Humalog Vials 75/25 - 17u BID wc Asks for larger supply of insulin and if he can lower dose gradually Reports good compliance. Tolerating well w/o side-effects Currently on ACEi Lifestyle: - Diet (trying to improve diet - eliminated french fries, chocolate) UTD DM eye No significant hypoglycemia. Prior 1 or less a month, sometimes minimal, has CBG 90-100 with symptoms, but no significant low hypoglycemia    HYPERLIPIDEMIA / Morbid Obesity BMI >46 - Reports no concerns. Last lipid 12/2021, controlled  - Currently taking Atorvastatin 80mg , Zetia 10mg , tolerating well without side effects or myalgias - weight improved. Mild Elevated LFTs normalized.    OSA, on CPAP - Patient reports prior history of dx OSA and on CPAP years. Last sleep study >10 year ago - Today reports that sleep apnea is well controlled.  uses the CPAP machine every night. Tolerates the machine well, and thinks that sleeps better with it and feels good. No new concerns or symptoms.   Unstable angina s/p PCI stent Syncopal episode CAD / Vascular Disease / S/p CABG Followed by [facility] Dr . history of CABG 01/2015 Recent history 12/2021 he was hospitalized after syncope and unstable angina, has had management with s/p PCI in cath lab with stent. Symptoms have improved He continues on DAPT with Plavix 75 and ASA 81 He remains chest pain free. Continues on BB, Imdur, Statin, Zetia   Discontinued on HCTZ and Potassium supplement. Reduce fluid intake Heart monitor       12/22/2021    2:09 PM 07/23/2021    7:59 AM 05/20/2021    4:39 PM  Depression screen PHQ 2/9  Decreased Interest 0 0  0  Down, Depressed, Hopeless 0 0 0  PHQ - 2 Score 0 0 0  Altered sleeping 0 0   Tired, decreased energy 0 0   Change in appetite 0 0   Feeling bad or failure about yourself  0 0   Trouble concentrating 0 0   Moving slowly or fidgety/restless 0 0   Suicidal thoughts 0 0   PHQ-9 Score 0 0   Difficult doing work/chores Not difficult at all Somewhat difficult     Social History   Tobacco Use   Smoking status: Former    Types: Cigarettes    Quit date: 08/06/1975    Years since quitting: 46.4   Smokeless tobacco: Never  Vaping Use   Vaping Use: Never used  Substance Use Topics   Alcohol use: No   Drug use: No    Review of Systems Per HPI unless specifically indicated above     Objective:    BP 125/74   Pulse 90   Ht 5\' 8"  (1.727 m)   Wt (!) 307 lb (139.3 kg)   SpO2 97%   BMI 46.68 kg/m   Wt Readings from Last 3 Encounters:  12/22/21 (!) 307 lb (139.3 kg)  12/17/21 299 lb 3.2 oz (135.7 kg)  12/15/21 298 lb (135.2 kg)    Physical Exam Vitals and nursing note reviewed.  Constitutional:      General: He is not in acute distress.    Appearance: He is  well-developed. He is obese. He is not diaphoretic.     Comments: Well-appearing, comfortable, cooperative  HENT:     Head: Normocephalic and atraumatic.  Eyes:     General:        Right eye: No discharge.        Left eye: No discharge.     Conjunctiva/sclera: Conjunctivae normal.  Neck:     Thyroid: No thyromegaly.  Cardiovascular:     Rate and Rhythm: Normal rate and regular rhythm.     Pulses: Normal pulses.     Heart sounds: Normal heart sounds. No murmur heard. Pulmonary:     Effort: Pulmonary effort is normal. No respiratory distress.     Breath sounds: Normal breath sounds. No wheezing or rales.  Musculoskeletal:        General: Normal range of motion.     Cervical back: Normal range of motion and neck supple.  Lymphadenopathy:     Cervical: No cervical adenopathy.  Skin:    General: Skin is warm and  dry.     Findings: No erythema or rash.  Neurological:     Mental Status: He is alert and oriented to person, place, and time. Mental status is at baseline.  Psychiatric:        Behavior: Behavior normal.     Comments: Well groomed, good eye contact, normal speech and thoughts      Recent Labs    01/19/21 1351 07/23/21 0845 12/22/21 1401  HGBA1C 6.6* 6.4* 6.2*     Results for orders placed or performed in visit on 12/22/21  POCT HgB A1C  Result Value Ref Range   Hemoglobin A1C 6.2 (A) 4.0 - 5.6 %      Assessment & Plan:   Problem List Items Addressed This Visit     RESOLVED: Unstable angina (HCC)   OSA on CPAP    Well controlled, chronic OSA on CPAP, now >10 years - Good adherence to CPAP nightly - Continue current CPAP therapy, patient seems to be benefiting from therapy       Essential hypertension   DM (diabetes mellitus), type 2 with peripheral vascular complications (HCC) - Primary    Well-controlled DM with A1c 6.2 Complications - vascular disease including CAD s/p CABG, PAD, among other including hyperlipidemia, morbid obesity, and OSA - increases risk of future cardiovascular complications and poor glucose control OFF Trulicity (intolerance)  Plan:  1. Continue current therapy - insulin therapy and metformin 2. Encourage improved lifestyle - low carb, low sugar diet, reduce portion size, continue improving regular exercise 3. Check CBG , bring log to next visit for review 4. Continue ASA, ACEi, Statin       Relevant Orders   POCT HgB A1C (Completed)   CAD (coronary artery disease), native coronary artery    Managed by Cardiology S/p hospitalization, cardiac cath stent PCI On optimized med management, and cardiac rehab  No orders of the defined types were placed in this encounter.     Follow up plan: Return in about 7 months (around 07/24/2022) for 7 month fasting lab then 1 week later Annual Physical.    Saralyn Pilar, DO [facility] 12/22/2021, 1:56 PM

## 2021-12-22 NOTE — Assessment & Plan Note (Signed)
Well-controlled DM with A1c 6.2 Complications - vascular disease including CAD s/p CABG, PAD, among other including hyperlipidemia, morbid obesity, and OSA - increases risk of future cardiovascular complications and poor glucose control OFF Trulicity (intolerance)  Plan:  1. Continue current therapy - insulin therapy and metformin 2. Encourage improved lifestyle - low carb, low sugar diet, reduce portion size, continue improving regular exercise 3. Check CBG , bring log to next visit for review 4. Continue ASA, ACEi, Statin

## 2021-12-28 ENCOUNTER — Ambulatory Visit: Payer: Medicaid Other | Admitting: Medical

## 2022-01-04 ENCOUNTER — Other Ambulatory Visit
Admission: RE | Admit: 2022-01-04 | Discharge: 2022-01-04 | Disposition: A | Discharge status: Home / Self Care | Payer: Medicaid Other | Attending: Physician Assistant | Admitting: Physician Assistant

## 2022-01-04 DIAGNOSIS — I2510 Atherosclerotic heart disease of native coronary artery without angina pectoris: Secondary | ICD-10-CM | POA: Insufficient documentation

## 2022-01-04 DIAGNOSIS — E871 Hypo-osmolality and hyponatremia: Secondary | ICD-10-CM | POA: Insufficient documentation

## 2022-01-04 DIAGNOSIS — E876 Hypokalemia: Secondary | ICD-10-CM | POA: Insufficient documentation

## 2022-01-04 DIAGNOSIS — R55 Syncope and collapse: Secondary | ICD-10-CM | POA: Diagnosis present

## 2022-01-04 LAB — BASIC METABOLIC PANEL
Anion gap: 5 (ref 5–15)
BUN: 15 mg/dL (ref 8–23)
CO2: 24 mmol/L (ref 22–32)
Calcium: 9.6 mg/dL (ref 8.9–10.3)
Chloride: 103 mmol/L (ref 98–111)
Creatinine, Ser: 0.96 mg/dL (ref 0.61–1.24)
GFR, Estimated: >60 mL/min (ref >60)
Glucose, Bld: 103 mg/dL — ABNORMAL HIGH (ref 70–99)
Potassium: 3.8 mmol/L (ref 3.5–5.1)
Sodium: 132 mmol/L — ABNORMAL LOW (ref 135–145)

## 2022-01-06 ENCOUNTER — Emergency Department: Payer: Medicaid Other

## 2022-01-06 ENCOUNTER — Emergency Department
Admission: EM | Admit: 2022-01-06 | Discharge: 2022-01-06 | Disposition: A | Discharge status: Home / Self Care | Payer: Medicaid Other | Attending: Emergency Medicine | Admitting: Emergency Medicine

## 2022-01-06 ENCOUNTER — Encounter: Payer: Self-pay | Admitting: Emergency Medicine

## 2022-01-06 DIAGNOSIS — R0789 Other chest pain: Secondary | ICD-10-CM | POA: Diagnosis not present

## 2022-01-06 DIAGNOSIS — R079 Chest pain, unspecified: Secondary | ICD-10-CM

## 2022-01-06 DIAGNOSIS — I10 Essential (primary) hypertension: Secondary | ICD-10-CM | POA: Diagnosis not present

## 2022-01-06 LAB — BASIC METABOLIC PANEL
Anion gap: 9 (ref 5–15)
BUN: 10 mg/dL (ref 8–23)
CO2: 21 mmol/L — ABNORMAL LOW (ref 22–32)
Calcium: 9.4 mg/dL (ref 8.9–10.3)
Chloride: 102 mmol/L (ref 98–111)
Creatinine, Ser: 0.73 mg/dL (ref 0.61–1.24)
GFR, Estimated: >60 mL/min (ref >60)
Glucose, Bld: 110 mg/dL — ABNORMAL HIGH (ref 70–99)
Potassium: 3.6 mmol/L (ref 3.5–5.1)
Sodium: 132 mmol/L — ABNORMAL LOW (ref 135–145)

## 2022-01-06 LAB — CBC
HCT: 39.4 % (ref 39.0–52.0)
Hemoglobin: 12.7 g/dL — ABNORMAL LOW (ref 13.0–17.0)
MCH: 27.4 pg (ref 26.0–34.0)
MCHC: 32.2 g/dL (ref 30.0–36.0)
MCV: 84.9 fL (ref 80.0–100.0)
Platelets: 179 10*3/uL (ref 150–400)
RBC: 4.64 MIL/uL (ref 4.22–5.81)
RDW: 15.1 % (ref 11.5–15.5)
WBC: 4.5 10*3/uL (ref 4.0–10.5)
nRBC: 0 % (ref 0.0–0.2)

## 2022-01-06 LAB — TROPONIN I (HIGH SENSITIVITY)
Troponin I (High Sensitivity): 36 ng/L — ABNORMAL HIGH (ref <18)
Troponin I (High Sensitivity): 36 ng/L — ABNORMAL HIGH (ref <18)

## 2022-01-06 MED ORDER — NITROGLYCERIN 0.4 MG SL SUBL
0.4000 mg | SUBLINGUAL_TABLET | SUBLINGUAL | Status: DC | PRN
  Administered 2022-01-06: 0.4 mg via SUBLINGUAL
  Filled 2022-01-06: qty 1

## 2022-01-06 NOTE — ED Provider Notes (Signed)
[facility] Provider Note    Event Date/Time   First MD Initiated Contact with Patient 01/06/22 0825     (approximate)   History   Chest Pain   HPI  Craig Harvey is a 66 y.o. male who comes in complaining of chest tightness.  It is in his left chest.  It does not seem to be associated with nausea or shortness of breath but it does feel like what he had just before he had his bypass surgery.  It started at 1:00 this morning.  Patient took some aspirin but no nitro.  Patient has had bypass in stents placed about 8 June this year.      Physical Exam   Triage Vital Signs: ED Triage Vitals [01/06/22 0310]  Enc Vitals Group     BP (!) 146/91     Pulse Rate 66     Resp 18     Temp 98.8 F (37.1 C)     Temp Source Oral     SpO2 98 %     Weight      Height      Head Circumference      Peak Flow      Pain Score      Pain Loc      Pain Edu?      Excl. in GC?     Most recent vital signs: Vitals:   01/06/22 0310 01/06/22 0619  BP: (!) 146/91 120/74  Pulse: 66 70  Resp: 18 18  Temp: 98.8 F (37.1 C)   SpO2: 98% 95%     General: Awake, no distress.  CV:  Good peripheral perfusion.  Regular rate and rhythm no audible murmurs Resp:  Normal effort.  Lungs are clear Abd:  No distention.  Abdomen soft nontender    ED Results / Procedures / Treatments   Labs (all labs ordered are listed, but only abnormal results are displayed) Labs Reviewed  BASIC METABOLIC PANEL - Abnormal; Notable for the following components:      Result Value   Sodium 132 (*)    CO2 21 (*)    Glucose, Bld 110 (*)    All other components within normal limits  CBC - Abnormal; Notable for the following components:   Hemoglobin 12.7 (*)    All other components within normal limits  TROPONIN I (HIGH SENSITIVITY) - Abnormal; Notable for the following components:   Troponin I (High Sensitivity) 36 (*)    All other components within normal limits  TROPONIN I (HIGH  SENSITIVITY) - Abnormal; Notable for the following components:   Troponin I (High Sensitivity) 36 (*)    All other components within normal limits     EKG  EKG read interpreted by me shows normal sinus rhythm rate of 69 left axis decreased R wave progression in the precordial leads however it looks similar to 1 from June 15 of this year with the exception of the fact that the flipped T's inferiorly in June have resolved.Marland Kitchen   RADIOLOGY Chest x-ray read by radiology reviewed and interpreted by me show no acute findings.   PROCEDURES:  Critical Care performed:   Procedures   MEDICATIONS ORDERED IN ED: Medications  nitroGLYCERIN (NITROSTAT) SL tablet 0.4 mg (0.4 mg Sublingual Given 01/06/22 0843)     IMPRESSION / MDM / ASSESSMENT AND PLAN / ED COURSE  I reviewed the triage vital signs and the nursing notes. ----------------------------------------- 9:52 AM on 01/06/2022 ----------------------------------------- Patient with 2  negative troponins.  They are stable at 36.  EKG looks okay no new changes.  At this time patient is saying when he yawns his chest muscles pull especially in the left upper chest which is where he was pointing to earlier.  This is much less worrisome than what he had told me initially.  Nitro did nothing to the pain.  I will let him go currently and have him follow-up with his cardiologist as soon as possible return if he has any of his heart type symptoms.  Differential diagnosis includes, but is not limited to, based on the patient's presentation most likely this is musculoskeletal pain.  There is no way to rule out angina currently but his troponins and EKG have been negative and now his pain is clearly musculoskeletal.  Patient's presentation is most consistent with acute complicated illness / injury requiring diagnostic workup.  he patient is on the cardiac monitor to evaluate for evidence of arrhythmia and/or significant heart rate changes.  None have been  seen      FINAL CLINICAL IMPRESSION(S) / ED DIAGNOSES   Final diagnoses:  Nonspecific chest pain     Rx / DC Orders   ED Discharge Orders          Ordered    Ambulatory referral to Cardiology       Comments: If you have not heard from the Cardiology office within the next 72 hours please call [phone].   01/06/22 0954             Note:  This document was prepared using Dragon voice recognition software and may include unintentional dictation errors.   Arnaldo Natal, MD 01/06/22 [phone]

## 2022-01-06 NOTE — Discharge Instructions (Addendum)
Since the pain you are having now feels like your chest muscles are pulling when you you are on I will let you go home.  If you have any chest pain like when your heart is acting up please return again.  Please follow-up with Dr. Mariah Milling your cardiologist and with your primary care doctor.  I have called and spoken with the cardiologist today they will message Dr. Mariah Milling as I also have.  We will try to get you in quickly.  Again do not hesitate to return if anything changes or gets worse.  Right now your blood work and EKG all look okay.

## 2022-01-06 NOTE — ED Notes (Signed)
This RN reviewed paperwork with pt. No further complaints or questions. Pt ambulated to lobby. Discharge form placed in med rec box.  

## 2022-01-06 NOTE — ED Triage Notes (Signed)
Pt arrived via [facility] from home with c/o left sided chest pain that began at 0100 this morning. Pt took 324 mg Aspirin prior to EMS arrival. Pt with hx/o recent stent placement on 6/8.

## 2022-01-07 ENCOUNTER — Telehealth: Payer: Self-pay

## 2022-01-07 NOTE — Patient Outreach (Signed)
Care Coordination  01/07/2022  TUCK DULWORTH 08-02-1955 [id number]  Transition Care Management Unsuccessful Follow-up Telephone Call  Date of discharge and from where:  01/06/22 [facility]   Attempts:  1st Attempt  Reason for unsuccessful TCM follow-up call:  Left voice message

## 2022-01-08 ENCOUNTER — Telehealth: Payer: Self-pay

## 2022-01-08 NOTE — Patient Outreach (Signed)
Care Coordination  01/08/2022  Craig Harvey 1955/09/05 [id number]  Transition Care Management Unsuccessful Follow-up Telephone Call  Date of discharge and from where:  01/06/22 [facility]   Attempts:  2nd Attempt  Reason for unsuccessful TCM follow-up call:  Left voice message

## 2022-01-09 NOTE — Progress Notes (Unsigned)
Cardiology Office Note  Date:  01/09/2022   ID:  Craig Harvey, DOB 1956/01/03, MRN [id number]  PCP:  Smitty Cords, DO   No chief complaint on file.   HPI:  66 year old male with known history of  HTN,  Morbid obesity OSA not on CPAP,  CAD s/p 3 vessel CABG on 01/14/2015,  DM,  HLD  who presents for routine follow-up of his coronary artery disease, diabetes, hyperlipidemia  Last seen in clinic by myself  2/23 In the ER 01/06/22 with chest pain  Cardiac cath 12/10/21   -----------------LEFT CORONARY---------------------------   Mid LM to Dist LM lesion is 15% stenosed with 25% stenosed side branch in Ramus.   Ost LAD to Prox LAD lesion is 70% stenosed.  Prox LAD to Mid LAD lesion is 100% stenosed.   Ost Cx to Prox Cx lesion is 100% stenosed.   Prox Cx to Mid Cx lesion is 80% stenosed.   -----------------GRAFTS---------------------------   LIMA graft was visualized by angiography and is large.  The graft exhibits no disease.  There is no competitive flow   SVG-OM graft was visualized by angiography. The graft exhibits no disease.   SVG-RI gaft was visulalized by angiograph => Origin to Prox Graft lesion is 100% stenosed.   ------------------------------------------------------   -----------------RIGHT CORONARY---------------------------   CULPRIT LESION: Prox RCA lesion is 95% stenosed.   Balloon angioplasty was performed using sequential BALLN SCOREFLEX Balloons 3.0 x 10 & 3.50X10.   A drug-eluting stent was successfully placed using a STENT ONYX FRONTIER 3.5X15 -> postdilated to 4.1 mm   Post intervention, there is a 0% residual stenosis.   ------------------------------------------------------   The left ventricular systolic function is normal.   LV end diastolic pressure is normal.   SUMMARY Severe Native CAD:  100% CTO of proximal LCx after OM1/RI, 90% followed 100% CTO of LAD after SP1;  Widely patent ostial RI CULPRIT LESION 95% focal (napkin ring) proximal  RCA Successful scoring balloon (sequential 3.0 mm & 3.5 mm ScoreFlex) angioplasty followed by => DES PCI of proximal RCA Onyx Frontier DES 3.5 mm x 15 mm postdilated to 4.1 mm. 95% reduced to 0% stenosis; TIMI-3 flow pre and post.   Distal LAD has tandem 50% lesions, distal LCx with retrograde flow reveals 80% stenosis prior to anastomosis. 2 of 3 grafts patent: SVG-OM, LIMA-LAD with flush occlusion of SVG-RI  In follow-up today reports doing relatively well Denies any anginal symptoms  Side effects on trulicity A1C 6.4 , could not eat, stomach pain Total chol 84  Works at home, fixing house Has a stationary bike No significant lower extremity edema  Chronic back pain worse, nerve pain left leg better  Previously reported having some erectile dysfunction issues,  low testosterone (results not seen in the computer)  EKG personally reviewed by mysel on todays visit Shows normal sinus rhythm rate 81 bpm  Unable to exclude old inferior MI  Other past medical history presented to [facility] on [facility]/06[facility]2015 with complaints of nausea, feeling hot, and chest pain with radiation into his left shoulder.  EKG was unremarkable in ED and Cardiac enzymes were negative.   Echo on 7/4 showed EF 60-65%, no regional wall motion abnormalities, no valvular abnormalities, PASP normal, normal study.  He underwent nuclear stress test that showed large defect of moderate severity in the mid anterior, mid anteroseptal, mid anterolateral, apical anterior, apical lateral and apex locations. F    underwent cardiac catheterization which showed severe 2 vessel CAD (ost  LAD 70%, mid LAD 99%, ost LCx to prox LCx 95% after the take off of OM1, mid LCx 80% prior to takeoff to OM2, normal LV function). It was felt CABG would be indicated and the patient was transferred to [facility] for further care.       He underwent successful 3 vessel CABG (LIMA-LAD, SVG-OM, SVG-Ramus) on 01/14/2015. Post-op he developed Afib and converted  on IV amiodarone. His A1C was poorly controlled coming in at 10.0%.     PMH:   has a past medical history of CAD (coronary artery disease), History of echocardiogram, Hyperlipidemia LDL goal <70, Hypertension, Morbid obesity (HCC), OSA (obstructive sleep apnea), and Type II diabetes mellitus (HCC).  PSH:    Past Surgical History:  Procedure Laterality Date   CARDIAC CATHETERIZATION N/A 01/09/2015   Procedure: Left Heart Cath and Coronary Angiography;  Surgeon: Antonieta Iba, MD;  Location: [facility] INVASIVE CV LAB;  Service: Cardiovascular;  Laterality: N/A;   CORONARY ARTERY BYPASS GRAFT N/A 01/14/2015   Procedure: CORONARY ARTERY BYPASS GRAFTING (CABG), ON PUMP, TIMES THREE, USING LEFT INTERNAL MAMMARY ARTERY, RIGHT GREATER SAPHENOUS VEIN HARVESTED ENDOSCOPICALLY;  Surgeon: Kerin Perna, MD;  Location: [facility] OR;  Service: Open Heart Surgery;  Laterality: N/A;   CORONARY STENT INTERVENTION N/A 12/10/2021   Procedure: CORONARY STENT INTERVENTION;  Surgeon: Marykay Lex, MD;  Location: [facility] INVASIVE CV LAB;  Service: Cardiovascular;  Laterality: N/A;   HERNIA REPAIR     LEFT HEART CATH AND CORONARY ANGIOGRAPHY N/A 12/10/2021   Procedure: LEFT HEART CATH AND CORONARY ANGIOGRAPHY;  Surgeon: Marykay Lex, MD;  Location: [facility] INVASIVE CV LAB;  Service: Cardiovascular;  Laterality: N/A;   TEE WITHOUT CARDIOVERSION N/A 01/14/2015   Procedure: TRANSESOPHAGEAL ECHOCARDIOGRAM (TEE);  Surgeon: Kerin Perna, MD;  Location: [facility] OR;  Service: Open Heart Surgery;  Laterality: N/A;    Current Outpatient Medications  Medication Sig Dispense Refill   aspirin EC 81 MG tablet Take 1 tablet (81 mg total) by mouth daily. 90 tablet 3   atorvastatin (LIPITOR) 80 MG tablet Take 1 tablet (80 mg total) by mouth daily. 90 tablet 3   baclofen (LIORESAL) 10 MG tablet Take 0.5-1 tablets (5-10 mg total) by mouth 2 (two) times daily as needed for muscle spasms (leg pain). 180 each 1   BD VEO INSULIN SYRINGE U/F 31G X  15/64" 0.3 ML MISC USE AS DIRECTED TWICE DAILY 200 each 3   clopidogrel (PLAVIX) 75 MG tablet Take 1 tablet (75 mg total) by mouth daily. 90 tablet 3   ezetimibe (ZETIA) 10 MG tablet Take 1 tablet (10 mg total) by mouth daily. 90 tablet 3   glucose blood (ACCU-CHEK AVIVA PLUS) test strip Use to check blood sugar up to 3 x daily 100 each 12   insulin lispro protamine-lispro (HUMALOG MIX 75/25) (75-25) 100 UNIT/ML SUSP injection INJECT 17 UNITS SUBCUTANEOUSLY TWICE DAILY WITH A MEAL. MAX OF 50 UNITS DAILY. 30 mL 3   isosorbide mononitrate (IMDUR) 60 MG 24 hr tablet Take 1 tablet (60 mg total) by mouth daily. 30 tablet 0   lisinopril (ZESTRIL) 10 MG tablet Take 1 tablet (10 mg total) by mouth daily. 90 tablet 3   metFORMIN (GLUCOPHAGE) 1000 MG tablet TAKE 1 TABLET BY MOUTH TWICE DAILY WITH A MEAL 180 tablet 3   metoprolol succinate (TOPROL-XL) 25 MG 24 hr tablet Take 1 tablet (25 mg total) by mouth daily. 30 tablet 1   Syringe, Disposable, 3 ML MISC Use  to inject humalog mix 75/25 17u BID 200 each 5   No current facility-administered medications for this visit.   Allergies:   Trulicity [dulaglutide]   Social History:  The patient  reports that he quit smoking about 46 years ago. His smoking use included cigarettes. He has never used smokeless tobacco. He reports that he does not drink alcohol and does not use drugs.   Family History:   family history includes CAD in his brother and mother; Throat cancer in his father.    Review of Systems: Review of Systems  Constitutional: Negative.   Respiratory: Negative.    Cardiovascular: Negative.   Gastrointestinal: Negative.   Musculoskeletal:  Positive for back pain.       Left leg nerve pain  Neurological: Negative.   Psychiatric/Behavioral: Negative.    All other systems reviewed and are negative.   PHYSICAL EXAM: VS:  There were no vitals taken for this visit. , BMI There is no height or weight on file to calculate BMI. Constitutional:   oriented to person, place, and time. No distress.  Obese HENT:  Head: Grossly normal Eyes:  no discharge. No scleral icterus.  Neck: No JVD, no carotid bruits  Cardiovascular: Regular rate and rhythm, no murmurs appreciated Pulmonary/Chest: Clear to auscultation bilaterally, no wheezes or rails Abdominal: Soft.  no distension.  no tenderness.  Musculoskeletal: Normal range of motion Neurological:  normal muscle tone. Coordination normal. No atrophy Skin: Skin warm and dry Psychiatric: normal affect, pleasant  Recent Labs: 12/07/2021: ALT 43 12/08/2021: TSH 0.888 01/06/2022: BUN 10; Creatinine, Ser 0.73; Hemoglobin 12.7; Platelets 179; Potassium 3.6; Sodium 132    Lipid Panel Lab Results  Component Value Date   CHOL 88 12/08/2021   HDL 34 (L) 12/08/2021   LDLCALC 41 12/08/2021   TRIG 63 12/08/2021    Wt Readings from Last 3 Encounters:  12/22/21 (!) 307 lb (139.3 kg)  12/17/21 299 lb 3.2 oz (135.7 kg)  12/15/21 298 lb (135.2 kg)     ASSESSMENT AND PLAN:  Coronary artery disease of bypass graft of native heart with stable angina pectoris (HCC) -  Currently with no symptoms of angina. No further workup at this time. Continue current medication regimen.  Mixed hyperlipidemia - Plan: EKG 12-Lead Cholesterol is at goal on the current lipid regimen. No changes to the medications were made.  Essential hypertension - Plan: EKG 12-Lead Blood pressure is well controlled on today's visit. No changes made to the medications.  Type 2 diabetes mellitus without complication, with long-term current use of insulin (HCC) - Plan: EKG 12-Lead  hemoglobin A 1C is 6.4, weight up since then We have encouraged continued exercise, careful diet management in an effort to lose weight.  Morbid obesity (HCC) - Plan: EKG 12-Lead  weight higher Increase his protein, walking program, diet modification  Erectile dysfunction Managed by PMD    Total encounter time more than 30 minutes  Greater than  50% was spent in counseling and coordination of care with the patient   No orders of the defined types were placed in this encounter.    Signed, Dossie Arbour, M.D., Ph.D. 01/09/2022  [facility] [place], [place] [phone]

## 2022-01-11 ENCOUNTER — Encounter: Payer: Self-pay | Admitting: Cardiovascular Disease

## 2022-01-11 ENCOUNTER — Other Ambulatory Visit: Payer: Self-pay | Admitting: Family Medicine

## 2022-01-11 ENCOUNTER — Ambulatory Visit (INDEPENDENT_AMBULATORY_CARE_PROVIDER_SITE_OTHER): Payer: Medicaid Other | Admitting: Cardiovascular Disease

## 2022-01-11 VITALS — BP 140/80 | HR 88 | Ht 68.0 in | Wt 304.0 lb

## 2022-01-11 DIAGNOSIS — G4733 Obstructive sleep apnea (adult) (pediatric): Secondary | ICD-10-CM

## 2022-01-11 DIAGNOSIS — R55 Syncope and collapse: Secondary | ICD-10-CM

## 2022-01-11 DIAGNOSIS — Z951 Presence of aortocoronary bypass graft: Secondary | ICD-10-CM | POA: Diagnosis not present

## 2022-01-11 DIAGNOSIS — G8929 Other chronic pain: Secondary | ICD-10-CM

## 2022-01-11 DIAGNOSIS — I10 Essential (primary) hypertension: Secondary | ICD-10-CM | POA: Diagnosis not present

## 2022-01-11 DIAGNOSIS — I2510 Atherosclerotic heart disease of native coronary artery without angina pectoris: Secondary | ICD-10-CM | POA: Diagnosis not present

## 2022-01-11 DIAGNOSIS — E785 Hyperlipidemia, unspecified: Secondary | ICD-10-CM | POA: Diagnosis not present

## 2022-01-11 DIAGNOSIS — Z6841 Body Mass Index (BMI) 40.0 and over, adult: Secondary | ICD-10-CM

## 2022-01-11 MED ORDER — NITROGLYCERIN 0.4 MG SL SUBL: 0.4000 mg | SUBLINGUAL_TABLET | SUBLINGUAL | 3 refills | Status: DC | PRN

## 2022-01-11 NOTE — Patient Instructions (Addendum)
Medication Instructions:   START taking nitro 0.4mg  as needed for chest pain. You may take one tablet every 5 minutes as needed, up to 3 doses.   If you need a refill on your cardiac medications before your next appointment, please call your pharmacy.   Lab work: No new labs needed  Testing/Procedures: No new testing needed  Follow-Up: At [facility], you and your health needs are our priority.  As part of our continuing mission to provide you with exceptional heart care, we have created designated Provider Care Teams.  These Care Teams include your primary Cardiologist (physician) and Advanced Practice Providers (APPs -  Physician Assistants and Nurse Practitioners) who all work together to provide you with the care you need, when you need it.  You will need a follow up appointment in 6 months  Providers on your designated Care Team:   Nicolasa Ducking, NP Eula Listen, PA-C Cadence Fransico Michael, [place]  COVID-19 Vaccine Information can be found at: [email or web address] For questions related to vaccine distribution or appointments, please email [email or web address] or call 2071275772.

## 2022-01-12 NOTE — Telephone Encounter (Signed)
Requested Prescriptions  Pending Prescriptions Disp Refills  . baclofen (LIORESAL) 10 MG tablet [Pharmacy Med Name: Baclofen 10 MG Oral Tablet] 180 tablet 0    Sig: TAKE 1/2 TO 1 (ONE-HALF TO ONE) TABLET BY MOUTH TWICE DAILY AS NEEDED FOR MUSCLE SPASM (LEG  PAIN)     Analgesics:  Muscle Relaxants - baclofen Passed - 01/11/2022  3:37 PM      Passed - Cr in normal range and within 180 days    Creat  Date Value Ref Range Status  07/23/2021 1.14 0.70 - 1.35 mg/dL Final   Creatinine, Ser  Date Value Ref Range Status  01/06/2022 0.73 0.61 - 1.24 mg/dL Final         Passed - eGFR is 30 or above and within 180 days    GFR, Est African American  Date Value Ref Range Status  07/15/2020 108 > OR = 60 mL/min/1.20m Final   GFR, Est Non African American  Date Value Ref Range Status  07/15/2020 93 > OR = 60 mL/min/1.778mFinal   GFR, Estimated  Date Value Ref Range Status  01/06/2022 >60 >60 mL/min Final    Comment:    (NOTE) Calculated using the CKD-EPI Creatinine Equation (2021)    eGFR  Date Value Ref Range Status  07/23/2021 72 > OR = 60 mL/min/1.7314minal    Comment:    The eGFR is based on the CKD-EPI 2021 equation. To calculate  the new eGFR from a previous Creatinine or Cystatin C result, go to https://www.kidney.org/professionals/ kdoqi/gfr%5Fcalculator          Passed - Valid encounter within last 6 months    Recent Outpatient Visits          3 weeks ago [facility]diabetes mellitus), type 2 [facility]   5 months ago Annual physical exam   [facility]   11 months ago [facility] (diabetes mellitus), type 2 [facility]   [facility]   1 year ago DM (diabetes mellitus), type 2  with peripheral vascular [facility]      Future Appointments            In 6 [facility]

## 2022-01-19 ENCOUNTER — Telehealth: Payer: Self-pay | Admitting: *Deleted

## 2022-01-19 DIAGNOSIS — E119 Type 2 diabetes mellitus without complications: Secondary | ICD-10-CM | POA: Diagnosis not present

## 2022-01-19 LAB — HM DIABETES EYE EXAM

## 2022-01-19 NOTE — Telephone Encounter (Signed)
Follow Up: ° ° ° ° °Patient is returning Pam's call, concerning his Monitor results. °

## 2022-01-19 NOTE — Telephone Encounter (Signed)
Patient made aware of monitor results with verbalized understanding.  °

## 2022-01-19 NOTE — Telephone Encounter (Signed)
-----   Message from Sondra Barges, PA-C sent at 01/19/2022 10:06 AM EDT ----- Heart monitor showed a predominant rhythm of sinus with an average rate of 71 bpm (range 29 to 235 bpm), first-degree AV block, secondary AV block type I (slight delayed electrical conduction that is benign), 3 episodes of NSVT with the longest episode lasting 10 beats, 6 episodes of SVT with the longest episode lasting 7 beats, along with rare atrial and ventricular ectopy.  No sustained arrhythmias were noted.  Follow-up as directed by primary cardiologist.

## 2022-01-19 NOTE — Telephone Encounter (Signed)
Left voicemail message to call back for review of results.  

## 2022-01-21 ENCOUNTER — Telehealth: Payer: Self-pay | Admitting: *Deleted

## 2022-01-21 NOTE — Patient Outreach (Signed)
Care Coordination  01/21/2022  PERSHING SKIDMORE 06-19-1956 [id number]  Transition Care Management Unsuccessful Follow-up Telephone Call  Date of discharge and from where:  01/06/22, [facility]  Attempts:  3rd Attempt  Reason for unsuccessful TCM follow-up call:  No answer/busy  Estanislado Emms RN, BSN [place]  [facility] RN Care Coordinator

## 2022-02-03 ENCOUNTER — Ambulatory Visit: Payer: Medicaid Other | Admitting: Cardiovascular Disease

## 2022-02-03 ENCOUNTER — Other Ambulatory Visit: Payer: Self-pay | Admitting: Family Medicine

## 2022-02-03 DIAGNOSIS — I2510 Atherosclerotic heart disease of native coronary artery without angina pectoris: Secondary | ICD-10-CM

## 2022-02-03 DIAGNOSIS — I10 Essential (primary) hypertension: Secondary | ICD-10-CM

## 2022-02-03 NOTE — Telephone Encounter (Signed)
Medication Refill - Medication: 90 day supply of isosorbide mononitrate (IMDUR) 60 MG 24 hr tablet  Has the patient contacted their pharmacy? Yes.    (Agent: If yes, when and what did the pharmacy advise?) Contact PCP office, request was sent to PCP and no response  Preferred Pharmacy (with phone number or street name):   [facility] [address] [place]), [place] - [address] Phone:  [phone]  Fax:  [phone]     Has the patient been seen for an appointment in the last year OR does the patient have an upcoming appointment? Yes.    Agent: Please be advised that RX refills may take up to 3 business days. We ask that you follow-up with your pharmacy.

## 2022-02-04 MED ORDER — ISOSORBIDE MONONITRATE ER 60 MG PO TB24: 60.0000 mg | ORAL_TABLET | Freq: Every day | ORAL | 1 refills | Status: DC

## 2022-02-04 NOTE — Telephone Encounter (Signed)
Requested medication (s) are due for refill today: yes  Requested medication (s) are on the active medication list: yes    Last refill: 12/09/21  #30  0 refills  Future visit scheduled yes 07/26/22  Notes to clinic:Historical Provider, please review. Thank you.  Requested Prescriptions  Pending Prescriptions Disp Refills   isosorbide mononitrate (IMDUR) 60 MG 24 hr tablet 30 tablet 0    Sig: Take 1 tablet (60 mg total) by mouth daily.     Cardiovascular:  Nitrates Failed - 02/03/2022 10:16 AM      Failed - Last BP in normal range    BP Readings from Last 1 Encounters:  01/11/22 140/80         Passed - Last Heart Rate in normal range    Pulse Readings from Last 1 Encounters:  01/11/22 88         Passed - Valid encounter within last 12 months    Recent Outpatient Visits           1 month ago DM (diabetes mellitus), type 2 with peripheral vascular complications [facility])   [facility], Netta Neat, DO   6 months ago Annual physical exam   [facility] [place], Netta Neat, DO   1 year ago DM (diabetes mellitus), type 2 with peripheral vascular complications [facility])   [facility] Smitty Cords, DO   1 year ago Annual physical exam   [facility] Smitty Cords, DO   1 year ago DM (diabetes mellitus), type 2 with peripheral vascular complications [facility])   [facility] Althea Charon, Netta Neat, DO       Future Appointments             In 5 months Althea Charon, Netta Neat, DO [facility], [facility]

## 2022-02-22 ENCOUNTER — Ambulatory Visit: Payer: Medicaid Other | Admitting: Physician Assistant

## 2022-02-26 DIAGNOSIS — Z006 Encounter for examination for normal comparison and control in clinical research program: Secondary | ICD-10-CM

## 2022-02-26 NOTE — Research (Signed)
Called patient to discuss the Ocean(a) clinical trial with no answer. Message left asking for a return call. Will try again at a later date.

## 2022-03-12 ENCOUNTER — Other Ambulatory Visit: Payer: Self-pay | Admitting: Family Medicine

## 2022-03-12 DIAGNOSIS — G8929 Other chronic pain: Secondary | ICD-10-CM

## 2022-03-12 NOTE — Telephone Encounter (Signed)
Pt requests 90 day supply.  Medication Refill - Medication: baclofen (LIORESAL) 10 MG tablet  Has the patient contacted their pharmacy? Yes.   Pt stated not getting an answer.   Preferred Pharmacy (with phone number or street name):  [facility] [address] [place]), [place] - [address] Phone:  [phone]  Fax:  [phone]     Has the patient been seen for an appointment in the last year OR does the patient have an upcoming appointment? Yes.    Agent: Please be advised that RX refills may take up to 3 business days. We ask that you follow-up with your pharmacy.

## 2022-03-15 NOTE — Telephone Encounter (Signed)
Pt never picked up 7/28 rx for 90 day supply, pharm filling, pt left voicemail.

## 2022-03-15 NOTE — Telephone Encounter (Signed)
Pt never picked up the 90 day supply rx at [facility]. They are filling it now. Left voicemail for pt.

## 2022-03-16 ENCOUNTER — Telehealth: Payer: Self-pay | Admitting: Cardiovascular Disease

## 2022-03-16 MED ORDER — NITROGLYCERIN 0.4 MG SL SUBL: 0.4000 mg | SUBLINGUAL_TABLET | SUBLINGUAL | 3 refills | Status: AC | PRN

## 2022-03-16 NOTE — Telephone Encounter (Signed)
*  STAT* If patient is at the pharmacy, call can be transferred to refill team.   1. Which medications need to be refilled? (please list name of each medication and dose if known) nitroGLYCERIN (NITROSTAT) 0.4 MG SL tablet  2. Which pharmacy/location (including street and city if local pharmacy) is medication to be sent to? [facility] - [place] ([address]), [place] - [address]  3. Do they need a 30 day or 90 day supply? 90

## 2022-03-16 NOTE — Telephone Encounter (Signed)
Spoke with the patient who states that last night his blood pressure jumped up to 202/115. He states that he felt very hot and jittery. He rested for a bit and it came down to 164/104. Readings from the weekend include 122/77 and 145/88. This morning his BP was 157/99. He states that he has started checking his blood pressure when he doesn't feel well and it will be elevated at times. Last night was the first time that his systolic got above 200 which caused him some concern. Reviewed patient's medications and he reports taking them as prescribed. When discussing diet he does seem to be eating some salty food such as bacon and sausage which I have encouraged him to avoid. Advised that I would send message to Dr. Mariah Milling for further recommendations.

## 2022-03-16 NOTE — Telephone Encounter (Signed)
Antonieta Iba, MD  [facility] Triage Caller: Unspecified (Today, 10:22 AM) Would continue to monitor blood pressure for now  If it does spike again could take either extra lisinopril 10 or extra half dose isosorbide to bring it down  Definitely watch out on the high salt diet, that can run it up  Thx  TG    Called and spoke with patient. He condones understanding directions listed above IF pressure spikes again. He will continue to monitor an call us back if he needs [place].

## 2022-03-16 NOTE — Telephone Encounter (Signed)
Completed.

## 2022-03-16 NOTE — Telephone Encounter (Signed)
Patient calling in to see if its okay for him to drive.  Pt c/o BP issue: STAT if pt c/o blurred vision, one-sided weakness or slurred speech  1. What are your last 5 BP readings? 202/115 then drop down to 164/104  2. Are you having any other symptoms (ex. Dizziness, headache, blurred vision, passed out)? Jitters  3. What is your BP issue?     Patient also calling in to see if its okay for him to drive. Please advise

## 2022-04-08 ENCOUNTER — Other Ambulatory Visit: Payer: Self-pay

## 2022-04-08 ENCOUNTER — Emergency Department: Payer: Medicaid Other

## 2022-04-08 ENCOUNTER — Encounter: Payer: Self-pay | Admitting: Emergency Medicine

## 2022-04-08 ENCOUNTER — Emergency Department
Admission: EM | Admit: 2022-04-08 | Discharge: 2022-04-08 | Disposition: A | Discharge status: Home / Self Care | Payer: Medicaid Other | Attending: Emergency Medicine | Admitting: Emergency Medicine

## 2022-04-08 ENCOUNTER — Ambulatory Visit: Payer: Self-pay | Admitting: *Deleted

## 2022-04-08 ENCOUNTER — Telehealth: Payer: Self-pay | Admitting: Cardiovascular Disease

## 2022-04-08 DIAGNOSIS — E871 Hypo-osmolality and hyponatremia: Secondary | ICD-10-CM | POA: Diagnosis not present

## 2022-04-08 DIAGNOSIS — R0789 Other chest pain: Secondary | ICD-10-CM | POA: Diagnosis not present

## 2022-04-08 DIAGNOSIS — I10 Essential (primary) hypertension: Secondary | ICD-10-CM | POA: Insufficient documentation

## 2022-04-08 DIAGNOSIS — I2510 Atherosclerotic heart disease of native coronary artery without angina pectoris: Secondary | ICD-10-CM | POA: Diagnosis not present

## 2022-04-08 DIAGNOSIS — E119 Type 2 diabetes mellitus without complications: Secondary | ICD-10-CM | POA: Insufficient documentation

## 2022-04-08 LAB — BASIC METABOLIC PANEL
Anion gap: 12 (ref 5–15)
BUN: 11 mg/dL (ref 8–23)
CO2: 21 mmol/L — ABNORMAL LOW (ref 22–32)
Calcium: 9.6 mg/dL (ref 8.9–10.3)
Chloride: 100 mmol/L (ref 98–111)
Creatinine, Ser: 0.86 mg/dL (ref 0.61–1.24)
GFR, Estimated: >60 mL/min (ref >60)
Glucose, Bld: 112 mg/dL — ABNORMAL HIGH (ref 70–99)
Potassium: 3.5 mmol/L (ref 3.5–5.1)
Sodium: 133 mmol/L — ABNORMAL LOW (ref 135–145)

## 2022-04-08 LAB — CBC
HCT: 45 % (ref 39.0–52.0)
Hemoglobin: 14.7 g/dL (ref 13.0–17.0)
MCH: 27.9 pg (ref 26.0–34.0)
MCHC: 32.7 g/dL (ref 30.0–36.0)
MCV: 85.6 fL (ref 80.0–100.0)
Platelets: 203 10*3/uL (ref 150–400)
RBC: 5.26 MIL/uL (ref 4.22–5.81)
RDW: 13.4 % (ref 11.5–15.5)
WBC: 3.4 10*3/uL — ABNORMAL LOW (ref 4.0–10.5)
nRBC: 0 % (ref 0.0–0.2)

## 2022-04-08 LAB — TROPONIN I (HIGH SENSITIVITY): Troponin I (High Sensitivity): 36 ng/L — ABNORMAL HIGH (ref <18)

## 2022-04-08 LAB — PROTIME-INR
INR: 1.2 (ref 0.8–1.2)
Prothrombin Time: 15 seconds (ref 11.4–15.2)

## 2022-04-08 NOTE — Telephone Encounter (Signed)
Pt c/o medication issue:  1. Name of Medication:   2. How are you currently taking this medication (dosage and times per day)?   3. Are you having a reaction (difficulty breathing--STAT)?   4. What is your medication issue? Pt was seen in hospital today for hypertension and ED physician suggested he get in to see his cardiologist to adjust his bp medication. He made appt for 10/09 in [place] office with Melinda Crutch, NP, but would like a call back to discuss his medication. Please advise.

## 2022-04-08 NOTE — Telephone Encounter (Signed)
  Chief Complaint: Hypertension Symptoms: Chest tightness. BP last night 200/115  during call 184/113.No missed meds. Frequency: LAst night Pertinent Negatives: Patient denies  Disposition: [x] ED /[] Urgent Care (no appt availability in office) / [] Appointment(In office/virtual)/ []  [facility]/ [] Home Care/ [] Refused Recommended Disposition /[] [facility]/ []  Follow-up with PCP Additional Notes: Advised ED, states will follow disposition.  Reason for Disposition  [7] Systolic BP  >= 262 OR Diastolic >= 035 AND [5] cardiac (e.g., breathing difficulty, chest pain) or neurologic symptoms (e.g., new-onset blurred or double vision, unsteady gait)  Answer Assessment - Initial Assessment Questions 1. BLOOD PRESSURE: "What is the blood pressure?" "Did you take at least two measurements 5 minutes apart?"     184/113 2. ONSET: "When did you take your blood pressure?"     Last night 200/115  During call   3. HOW: "How did you take your blood pressure?" (e.g., automatic home BP monitor, visiting nurse)     Home monitor 4. HISTORY: "Do you have a history of high blood pressure?"     Yes 5. MEDICINES: "Are you taking any medicines for blood pressure?" "Have you missed any doses recently?"     Yes, no missed doses 6. OTHER SYMPTOMS: "Do you have any symptoms?" (e.g., blurred vision, chest pain, difficulty breathing, headache, weakness)     Chest tightness "Not pain, tightness not unusual for me, take muscle relaxer and goes away."  Protocols used: Blood Pressure - High-A-AH

## 2022-04-08 NOTE — ED Triage Notes (Signed)
First Nurse Note:  Pt via EMS from home. Pt c/o chest pain and hypertensive. Pt has a hx of HTN. EMS gave 324 ASA. Pt denies any chest pain at this times. Pt is A&Ox4 and NAD  160/100

## 2022-04-08 NOTE — Discharge Instructions (Addendum)
-  Please review the educational material provided.  Continue to take your blood pressure medications as prescribed.  Please follow-up with your primary care provider or cardiologist in regards to ongoing blood pressure management.  -Return to the emergency department anytime if you begin to experience any new or worsening symptoms.

## 2022-04-08 NOTE — ED Triage Notes (Signed)
Pt to ED from home via EMS for high BP and  and chest tightness. Pt. States he has been struggling controlling his BP since he had stents put in in July. Pt. States he takes muscle relaxers for chest tightness, but it does not help when his pressure gets this high. BP last night was 205/115, this AM 184/113. Pt. States BP is good during day, but towards evening it starts to get high.

## 2022-04-08 NOTE — ED Provider Notes (Signed)
[facility] Provider Note    Event Date/Time   First MD Initiated Contact with Patient 04/08/22 1308     (approximate)   History   Chief Complaint Hypertension (Pt to ED from home via EMS for high BP and  and chest tightness. Pt. States he has been struggling controlling his BP since he had stents put in in July. Pt. States he takes muscle relaxers for chest tightness, but it does not help when his pressure gets this high. BP last night was 205/115, this AM 184/113.)   HPI Craig Harvey is a 66 y.o. male, history of type 2 diabetes, obesity, hypertension, hyperlipidemia, CAD, presents emergency department for evaluation of hypertension.  Patient states that his blood pressure was normal around 130/80, however over the past 2 weeks he has had these transient episodes of elevated blood pressure, one of them as high as 205/115 last night and 184/113 this morning.  He states that his blood pressure comes back down on its own without any interventions.  He currently takes lisinopril 10 mg for his blood pressure.  Ever since he had stents placed in July, he describes intermittent episodes of left-sided chest pain as well.  Has been evaluated by cardiology before and thought to be due to muscle spasms.  He is currently asymptomatic at this time and states that his blood pressure has been normal today.  Denies fever/chills, chest pain, shortness of breath, abdominal pain, leg swelling, flank pain, nausea/vomiting, diarrhea, dizziness/lightheadedness, or rash/lesions.  History Limitations: No limitations.        Physical Exam  Triage Vital Signs: ED Triage Vitals  Enc Vitals Group     BP 04/08/22 1146 (!) 151/101     Pulse Rate 04/08/22 1146 90     Resp 04/08/22 1146 18     Temp 04/08/22 1146 98.3 F (36.8 C)     Temp Source 04/08/22 1146 Oral     SpO2 04/08/22 1146 92 %     Weight 04/08/22 1148 285 lb (129.3 kg)     Height 04/08/22 1148 5\' 8"  (1.727 m)     Head  Circumference --      Peak Flow --      Pain Score 04/08/22 1147 2     Pain Loc --      Pain Edu? --      Excl. in GC? --     Most recent vital signs: Vitals:   04/08/22 1146  BP: (!) 151/101  Pulse: 90  Resp: 18  Temp: 98.3 F (36.8 C)  SpO2: 92%    General: Awake, NAD.  Skin: Warm, dry. No rashes or lesions.  Eyes: PERRL. Conjunctivae normal.  CV: Good peripheral perfusion.  S1 and S2 present, no murmurs, rubs, or gallops. Resp: Normal effort.  Lung sounds are clear bilaterally. Abd: Soft, non-tender. No distention.  Neuro: At baseline. No gross neurological deficits.  Musculoskeletal: Normal ROM of all extremities.  Focused Exam: Tenderness appreciated when palpating the left pectoral muscle, patient describes as feeling "sore"  Physical Exam    ED Results / Procedures / Treatments  Labs (all labs ordered are listed, but only abnormal results are displayed) Labs Reviewed  BASIC METABOLIC PANEL - Abnormal; Notable for the following components:      Result Value   Sodium 133 (*)    CO2 21 (*)    Glucose, Bld 112 (*)    All other components within normal limits  CBC - Abnormal; Notable  for the following components:   WBC 3.4 (*)    All other components within normal limits  TROPONIN I (HIGH SENSITIVITY) - Abnormal; Notable for the following components:   Troponin I (High Sensitivity) 36 (*)    All other components within normal limits  PROTIME-INR  TROPONIN I (HIGH SENSITIVITY)     EKG Sinus rhythm, rate of 88, no significant ST segment changes, no AV blocks, no QT prolongation, no axis deviations.     RADIOLOGY  ED Provider Interpretation: I personally reviewed and interpreted this x-ray, no evidence of acute cardiopulmonary abnormalities.  DG Chest 2 View  Result Date: 04/08/2022 CLINICAL DATA:  Chest tightness.  Hypertension. EXAM: CHEST - 2 VIEW COMPARISON:  Chest radiographs 01/06/2022 FINDINGS: Sequelae of CABG are again identified. The  cardiomediastinal silhouette is unchanged with normal heart size. There is mild chronic prominence of the interstitial markings. No acute airspace consolidation, edema, pleural effusion, or pneumothorax is identified. No acute osseous abnormality is seen. IMPRESSION: No active cardiopulmonary disease. Electronically Signed   By: Sebastian Ache M.D.   On: 04/08/2022 12:22    PROCEDURES:  Critical Care performed: N/A.  Procedures    MEDICATIONS ORDERED IN ED: Medications - No data to display   IMPRESSION / MDM / ASSESSMENT AND PLAN / ED COURSE  I reviewed the triage vital signs and the nursing notes.                              Differential diagnosis includes, but is not limited to, ACS, costochondritis, anxiety, pulmonary embolism, hypertension, CHF.  ED Course Patient appears well, vitals within normal limits.  NAD.  Blood pressure is mildly elevated at 151/101.  CBC shows no leukocytosis or anemia.  BMP shows mild hyponatremia, otherwise no significant electrolyte abnormalities.  No AKI.  Troponin elevated at 36, consistent with previous values.  Assessment/Plan Patient presents with concern for hypertension and intermittent episodes of chest discomfort on the left side.  Blood pressure appears fairly normal at this time.  His episodes of elevated blood pressure seem to self resolve after a brief period at home based on his history.  Does not appear to be symptomatic during those times.  Currently asymptomatic today.  Recommend that he follow-up with his primary care provider and/or cardiologist in regards to chronic blood pressure management.  Reassured him that all of his labs were reassuring and his EKG was unremarkable.  Very low suspicion for ACS at this time.  Symptoms do not appear consistent with pulmonary embolism or other serious or life-threatening pathology either.  The pain on the left side of his chest appears to be reproducible.  He is currently taking muscle relaxers for  this.  Patient was reassured by the negative findings.  He has an appointment scheduled with his cardiologist in 5 days.  Encouraged him to follow through with this appointment.  No further work-up or treatment needed at this time.  Will discharge.  Considered admission for this patient, but given his stable presentation and unremarkable work-up, he is unlikely benefit from admission.  Provided the patient with anticipatory guidance, return precautions, and educational material. Encouraged the patient to return to the emergency department at any time if they begin to experience any new or worsening symptoms. Patient expressed understanding and agreed with the plan.   Patient's presentation is most consistent with acute complicated illness / injury requiring diagnostic workup.       FINAL  CLINICAL IMPRESSION(S) / ED DIAGNOSES   Final diagnoses:  Hypertension, unspecified type  Atypical chest pain     Rx / DC Orders   ED Discharge Orders     None        Note:  This document was prepared using Dragon voice recognition software and may include unintentional dictation errors.   Teodoro Spray, [place] 04/08/22 1406    Blake Divine, MD 04/08/22 1504

## 2022-04-08 NOTE — Telephone Encounter (Signed)
Agree with triage to ED  Craig Harvey, [facility] 04/08/2022, 2:06 PM

## 2022-04-09 NOTE — Telephone Encounter (Signed)
Spoke with the patient who states that he wanted to make sure he didn't need to be seen sooner than Monday 10/9. Patient states that the ER recommended that he be seen today. Patient states that he feels good this morning. He has not taken his medications yet this morning or checked his blood pressure. Advised patient to monitor his BP after taking his meds and if it is elevated to call us back. Advised to keep appointment 10/9. He is also going to reach out to his PCP

## 2022-04-11 NOTE — Progress Notes (Deleted)
Cardiology Clinic Note   Patient Name: Craig Harvey Date of Encounter: 04/11/2022  Primary Care Provider:  [name], DO Primary Cardiologist:  Ida Rogue, MD  Patient Profile    66 year old male with a history of CAD status post three-vessel CABG in 01/2015 status post PCI/DES to the native proximal RCA on 12/10/2021 with postoperative atrial fibrillation, hypertension, hyperlipidemia, insulin-dependent diabetes, obesity and sleep apnea who presents today for follow-up.  Past Medical History    Past Medical History:  Diagnosis Date   CAD (coronary artery disease)    a. 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 cardiac cath 01/09/2015: ostLAD 70:, mLAD 99%, ost to pLCx 95%, mLCx 80%, LVEF nl, recommend CABGl; c. s/p 3v CABG 01/14/15 LIMA-LAD, SVG-OM, SVG-Ramus   History of echocardiogram    a. 01/2015 Echo: EF 60-65%, no rwma, nl RV fxn. Nl PASP.   Hyperlipidemia LDL goal <70    Hypertension    Morbid obesity (HCC)    OSA (obstructive sleep apnea)    Type II diabetes mellitus ([place])    Past Surgical History:  Procedure Laterality Date   CARDIAC CATHETERIZATION N/A 01/09/2015   Procedure: Left Heart Cath and Coronary Angiography;  Surgeon: Minna Merritts, MD;  Location: [place] [facility];  Service: Cardiovascular;  Laterality: N/A;   CORONARY ARTERY BYPASS GRAFT N/A 01/14/2015   Procedure: CORONARY ARTERY BYPASS GRAFTING (CABG), ON PUMP, TIMES THREE, USING LEFT INTERNAL MAMMARY ARTERY, RIGHT GREATER SAPHENOUS VEIN HARVESTED ENDOSCOPICALLY;  Surgeon: Ivin Poot, MD;  Location: [place];  Service: Open Heart Surgery;  Laterality: N/A;   CORONARY STENT INTERVENTION N/A 12/10/2021   Procedure: CORONARY STENT INTERVENTION;  Surgeon: Leonie Man, MD;  Location: [place] [facility];  Service: Cardiovascular;  Laterality: N/A;   HERNIA REPAIR     LEFT HEART CATH AND CORONARY ANGIOGRAPHY N/A 12/10/2021   Procedure: LEFT HEART CATH AND CORONARY ANGIOGRAPHY;  Surgeon: Leonie Man, MD;  Location: [place] [facility];  Service: Cardiovascular;  Laterality: N/A;   TEE WITHOUT CARDIOVERSION N/A 01/14/2015   Procedure: TRANSESOPHAGEAL ECHOCARDIOGRAM (TEE);  Surgeon: Ivin Poot, MD;  Location: [place];  Service: Open Heart Surgery;  Laterality: N/A;    Allergies  Allergies  Allergen Reactions   Trulicity [Dulaglutide] Other (See Comments)    Headaches     History of Present Illness    Craig Harvey is a 66 year old male with a history of coronary artery disease status post three-vessel CABG in 01/2015 status post PCI/DES to the native proximal RCA on 12/10/2021 with postoperative atrial fibrillation, hypertension, hyperlipidemia, insulin-dependent diabetes, obesity and sleep apnea.  He was admitted to the hospital on 01/2015 with unstable angina and ruled out for myocardial infarction.  Echocardiogram revealed an EF of 60-65%, no regional wall motion abnormalities, no significant valvular abnormalities, and normal PASP.  Nuclear stress testing showed a large defect of moderate severity in the mid anterior, mid anterior septal, mid anterior lateral, apical anterior, apical lateral, and apex locations.  Study with high risk.  He subsequently underwent left heart catheterization which showed severe two-vessel CAD as outlined below.  He was transferred to [facility] where he underwent three-vessel CABG with LIMA to LAD, SVG to OM, and SVG to ramus.  Postoperative course was complicated from  A-fib converting to sinus with IV amiodarone.  He was then admitted to the hospital on 6/5 through 12/09/2021 for unstable angina.  High-sensitivity troponin peaked at 32.  Echocardiogram demonstrated an EF of 60-65%, no regional wall motion abnormalities, mild LVH, G1 DD, and no  significant valvular abnormalities.  Imdur was titrated to 60 mg daily.  He noted improvement in symptoms and outpatient ischemic testing was recommended.  However he returned to the hospital the same day he was discharged with recurrent unstable angina with a troponin of 28.  Left heart cath demonstrated severe native vessel coronary artery disease with 2 of 3 grafts patent (LIMA to LAD and SVG to OM).  There was an occlusion noted of the SVG to ramus.  The culprit lesion was a 95% focal proximal RCA stenosis.  He underwent successful balloon angioplasty followed by DES with 0% residual stenosis.  He was seen for hospital follow-up on 12/15/2021 and was without symptoms of angina.  No missed doses of DAPT.  He only noted some mild fatigue.  At that time his Toprol-XL was decreased to 25 mg daily.  After his previous visit he was sent to the lab for blood work and became flushed and suffered a syncopal episode.  He was taken to the emergency department and in the emergency department his blood pressure was soft at 96/71 with a heartbeat of 77 bpm.  High-sensitivity troponin was 116 and downtrending to 107 and felt to be in the setting of a recent PCI to the RCA.  He was discharged to close outpatient follow-up.  He was last seen in clinic by Dr. Rockey Situ on 01/11/2022 and was without symptoms.  He stated that he had been doing fairly well and he had had no further syncopal episodes and there were no medication changes made at his visit.  He returns to clinic today  Home Medications    Current Outpatient Medications  Medication Sig Dispense Refill   aspirin EC 81 MG tablet Take 1 tablet (81 mg total) by mouth daily. 90 tablet 3   atorvastatin (LIPITOR) 80 MG tablet Take 1 tablet (80 mg total) by mouth daily. 90 tablet 3   baclofen (LIORESAL) 10 MG tablet TAKE 1/2 TO 1 (ONE-HALF TO ONE) TABLET BY MOUTH TWICE DAILY AS NEEDED FOR MUSCLE SPASM (LEG  PAIN) 180 tablet 0   BD VEO INSULIN SYRINGE U/F 31G X  15/64" 0.3 ML MISC USE AS DIRECTED TWICE DAILY 200 each 3   clopidogrel (PLAVIX) 75 MG tablet Take 1 tablet (75 mg total) by mouth daily. 90 tablet 3   ezetimibe (ZETIA) 10 MG tablet Take 1 tablet (10 mg total) by mouth daily. 90 tablet 3   glucose blood (ACCU-CHEK AVIVA PLUS) test strip Use to check blood sugar up to 3 x daily 100 each 12   insulin lispro protamine-lispro (HUMALOG MIX 75/25) (75-25) 100 UNIT/ML SUSP injection INJECT 17 UNITS SUBCUTANEOUSLY TWICE DAILY WITH A MEAL. MAX OF 50 UNITS DAILY. 30 mL 3   isosorbide mononitrate (IMDUR) 60 MG 24 hr tablet Take 1 tablet (60 mg total) by mouth daily. 90 tablet 1   lisinopril (ZESTRIL) 10 MG tablet Take 1 tablet (10 mg total) by mouth daily. 90 tablet 3   metFORMIN (GLUCOPHAGE) 1000 MG tablet TAKE 1 TABLET BY MOUTH TWICE DAILY WITH A MEAL 180 tablet 3   metoprolol succinate (TOPROL-XL) 25 MG 24 hr tablet Take 1 tablet (25 mg total) by mouth daily. 30 tablet 1   nitroGLYCERIN (  NITROSTAT) 0.4 MG SL tablet Place 1 tablet (0.4 mg total) under the tongue every 5 (five) minutes as needed for chest pain. 25 tablet 3   Syringe, Disposable, 3 ML MISC Use to inject humalog mix 75/25 17u BID 200 each 5   No current facility-administered medications for this visit.     Family History    Family History  Problem Relation Age of Onset   CAD Mother    Throat cancer Father    CAD Brother    Diabetes Neg Hx    Cancer Neg Hx    Prostate cancer Neg Hx    Colon cancer Neg Hx    He indicated that his mother is deceased. He indicated that his father is deceased. He indicated that his brother is alive. He indicated that the status of his neg hx is unknown.  Social History    Social History   Socioeconomic History   Marital status: Single    Spouse name: Not on file   Number of children: Not on file   Years of education: Not on file   Highest education level: Not on file  Occupational History   Occupation: Former Administrator / Patent attorney   Tobacco Use   Smoking status: Former    Types: Cigarettes    Quit date: 08/06/1975    Years since quitting: 46.7   Smokeless tobacco: Never  Vaping Use   Vaping Use: Never used  Substance and Sexual Activity   Alcohol use: No   Drug use: No   Sexual activity: Yes  Other Topics Concern   Not on file  Social History Narrative   Not on file   Social Determinants of Health   Financial Resource Strain: Low Risk  (05/20/2021)   Overall Financial Resource Strain (CARDIA)    Difficulty of Paying Living Expenses: Not hard at all  Food Insecurity: No Food Insecurity (05/20/2021)   Hunger Vital Sign    Worried About Running Out of Food in the Last Year: Never true    [place] in the Last Year: Never true  Transportation Needs: No Transportation Needs (05/20/2021)   PRAPARE - Hydrologist (Medical): No    Lack of Transportation (Non-Medical): No  Physical Activity: Sufficiently Active (05/20/2021)   Exercise Vital Sign    Days of Exercise per Week: 4 days    Minutes of Exercise per Session: 40 min  Stress: No Stress Concern Present (05/20/2021)   [place]    Feeling of Stress : Not at all  Social Connections: Socially Isolated (05/20/2021)   Social Connection and Isolation Panel [NHANES]    Frequency of Communication with Friends and Family: Three times a week    Frequency of Social Gatherings with Friends and Family: Three times a week    Attends Religious Services: Never    Active Member of Clubs or Organizations: No    Attends Archivist Meetings: Never    Marital Status: Never married  Human resources officer Violence: Not on file     Review of Systems    General:  No chills, fever, night sweats or weight changes.  Cardiovascular:  No chest pain, dyspnea on exertion, edema, orthopnea, palpitations, paroxysmal nocturnal dyspnea. Dermatological: No rash,  lesions/masses Respiratory: No cough, dyspnea Urologic: No hematuria, dysuria Abdominal:   No nausea, vomiting, diarrhea, bright red blood per rectum, melena, or hematemesis Neurologic:  No visual changes, wkns,  changes in mental status. All other systems reviewed and are otherwise negative except as noted above.     Physical Exam    VS:  There were no vitals taken for this visit. , BMI There is no height or weight on file to calculate BMI.     GEN: Well nourished, well developed, in no acute distress. HEENT: normal. Neck: Supple, no JVD, carotid bruits, or masses. Cardiac: RRR, no murmurs, rubs, or gallops. No clubbing, cyanosis, edema.  Radials/DP/PT 2+ and equal bilaterally.  Respiratory:  Respirations regular and unlabored, clear to auscultation bilaterally. GI: Soft, nontender, nondistended, BS + x 4. MS: no deformity or atrophy. Skin: warm and dry, no rash. Neuro:  Strength and sensation are intact. Psych: Normal affect.  Accessory Clinical Findings    ECG personally reviewed by me today- *** - No acute changes  Lab Results  Component Value Date   WBC 3.4 (L) 04/08/2022   HGB 14.7 04/08/2022   HCT 45.0 04/08/2022   MCV 85.6 04/08/2022   PLT 203 04/08/2022   Lab Results  Component Value Date   CREATININE 0.86 04/08/2022   BUN 11 04/08/2022   NA 133 (L) 04/08/2022   K 3.5 04/08/2022   CL 100 04/08/2022   CO2 21 (L) 04/08/2022   Lab Results  Component Value Date   ALT 43 12/07/2021   AST 36 12/07/2021   ALKPHOS 43 12/07/2021   BILITOT 0.6 12/07/2021   Lab Results  Component Value Date   CHOL 88 12/08/2021   HDL 34 (L) 12/08/2021   LDLCALC 41 12/08/2021   TRIG 63 12/08/2021   CHOLHDL 2.6 12/08/2021    Lab Results  Component Value Date   HGBA1C 6.2 (A) 12/22/2021    Assessment & Plan   1.  ***  Larya Charpentier, NP 04/11/2022, 5:06 PM

## 2022-04-12 ENCOUNTER — Ambulatory Visit: Payer: Medicaid Other | Admitting: Cardiology

## 2022-04-12 ENCOUNTER — Encounter: Payer: Self-pay | Admitting: Cardiology

## 2022-04-12 ENCOUNTER — Ambulatory Visit: Payer: Medicaid Other | Attending: Cardiology | Admitting: Cardiology

## 2022-04-12 VITALS — BP 150/94 | HR 75 | Ht 68.0 in | Wt 285.0 lb

## 2022-04-12 DIAGNOSIS — R072 Precordial pain: Secondary | ICD-10-CM

## 2022-04-12 DIAGNOSIS — I10 Essential (primary) hypertension: Secondary | ICD-10-CM | POA: Diagnosis not present

## 2022-04-12 DIAGNOSIS — E785 Hyperlipidemia, unspecified: Secondary | ICD-10-CM

## 2022-04-12 DIAGNOSIS — E1159 Type 2 diabetes mellitus with other circulatory complications: Secondary | ICD-10-CM | POA: Diagnosis not present

## 2022-04-12 DIAGNOSIS — I2510 Atherosclerotic heart disease of native coronary artery without angina pectoris: Secondary | ICD-10-CM | POA: Diagnosis not present

## 2022-04-12 MED ORDER — LISINOPRIL 10 MG PO TABS: 10.0000 mg | ORAL_TABLET | Freq: Two times a day (BID) | ORAL | 3 refills | Status: DC

## 2022-04-12 NOTE — Progress Notes (Signed)
Cardiology Clinic Note   Patient Name: Craig Harvey Date of Encounter: 04/12/2022  Primary Care Provider:  [name], DO Primary Cardiologist:  Ida Rogue, MD  Patient Profile    66 year old male with a history of CAD status post three-vessel CABG in 01/2015 status post PCI/DES to the native proximal RCA on 12/10/2021 with postoperative atrial fibrillation, hypertension, hyperlipidemia, insulin-dependent diabetes, obesity and sleep apnea who presents today for follow-up.  Past Medical History    Past Medical History:  Diagnosis Date   CAD (coronary artery disease)    a. 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 cardiac cath 01/09/2015: ostLAD 70:, mLAD 99%, ost to pLCx 95%, mLCx 80%, LVEF nl, recommend CABGl; c. s/p 3v CABG 01/14/15 LIMA-LAD, SVG-OM, SVG-Ramus   History of echocardiogram    a. 01/2015 Echo: EF 60-65%, no rwma, nl RV fxn. Nl PASP.   Hyperlipidemia LDL goal <70    Hypertension    Morbid obesity (HCC)    OSA (obstructive sleep apnea)    Type II diabetes mellitus ([place])    Past Surgical History:  Procedure Laterality Date   CARDIAC CATHETERIZATION N/A 01/09/2015   Procedure: Left Heart Cath and Coronary Angiography;  Surgeon: Minna Merritts, MD;  Location: [place] [facility];  Service: Cardiovascular;  Laterality: N/A;   CORONARY ARTERY BYPASS GRAFT N/A 01/14/2015   Procedure: CORONARY ARTERY BYPASS GRAFTING (CABG), ON PUMP, TIMES THREE, USING LEFT INTERNAL MAMMARY ARTERY, RIGHT GREATER SAPHENOUS VEIN HARVESTED ENDOSCOPICALLY;  Surgeon: Ivin Poot, MD;  Location: [place];  Service: Open Heart Surgery;  Laterality: N/A;   CORONARY STENT INTERVENTION N/A 12/10/2021   Procedure: CORONARY STENT INTERVENTION;  Surgeon: Leonie Man, MD;  Location: [place] [facility];  Service: Cardiovascular;  Laterality: N/A;   HERNIA REPAIR     LEFT HEART CATH AND CORONARY ANGIOGRAPHY N/A 12/10/2021   Procedure: LEFT HEART CATH AND CORONARY ANGIOGRAPHY;  Surgeon: Leonie Man, MD;  Location: [place] [facility];  Service: Cardiovascular;  Laterality: N/A;   TEE WITHOUT CARDIOVERSION N/A 01/14/2015   Procedure: TRANSESOPHAGEAL ECHOCARDIOGRAM (TEE);  Surgeon: Ivin Poot, MD;  Location: [place];  Service: Open Heart Surgery;  Laterality: N/A;    Allergies  Allergies  Allergen Reactions   Trulicity [Dulaglutide] Other (See Comments)    Headaches     History of Present Illness    Craig Harvey is a 66 year old male with a history of coronary artery disease status post three-vessel CABG in 01/2015 status post PCI/DES to the native proximal RCA on 12/10/2021 with postoperative atrial fibrillation, hypertension, hyperlipidemia, insulin-dependent diabetes, obesity and sleep apnea.   He was admitted to the hospital on 01/2015 with unstable angina and ruled out for myocardial infarction.  Echocardiogram revealed an EF of 60-65%, no regional wall motion abnormalities, no significant valvular abnormalities, and normal PASP.  Nuclear stress testing showed a large defect of moderate severity in the mid anterior, mid anterior septal, mid anterior lateral, apical anterior, apical lateral, and apex locations.  Study with high risk.  He subsequently underwent left heart catheterization which showed severe two-vessel CAD as outlined below.  He was transferred to [facility] where he underwent three-vessel CABG with LIMA to LAD, SVG to OM, and SVG to ramus.  Postoperative course was complicated  from A-fib converting to sinus with IV amiodarone.   He was then admitted to the hospital on 6/5 through 12/09/2021 for unstable angina.  High-sensitivity troponin peaked at 32.  Echocardiogram demonstrated an EF of 60-65%, no regional wall motion abnormalities, mild LVH, G1 DD, and  no significant valvular abnormalities.  Imdur was titrated to 60 mg daily.  He noted improvement in symptoms and outpatient ischemic testing was recommended.  However he returned to the hospital the same day he was discharged with recurrent unstable angina with a troponin of 28.  Left heart cath demonstrated severe native vessel coronary artery disease with 2 of 3 grafts patent (LIMA to LAD and SVG to OM).  There was an occlusion noted of the SVG to ramus.  The culprit lesion was a 95% focal proximal RCA stenosis.  He underwent successful balloon angioplasty followed by DES with 0% residual stenosis.   He was seen for hospital follow-up on 12/15/2021 and was without symptoms of angina.  No missed doses of DAPT.  He only noted some mild fatigue.  At that time his Toprol-XL was decreased to 25 mg daily.   After his previous visit he was sent to the lab for blood work and became flushed and suffered a syncopal episode.  He was taken to the emergency department and in the emergency department his blood pressure was soft at 96/71 with a heartbeat of 77 bpm.  High-sensitivity troponin was 116 and downtrending to 107 and felt to be in the setting of a recent PCI/DES to the RCA.  He was discharged to close outpatient follow-up.   He was last seen in clinic by Dr. Mariah Milling on 01/11/2022 and was with c/o chest tightness.  He stated that he had been doing fairly well and he had had no further syncopal episodes and there were no medication changes made at his visit.   He returns to clinic today with complaints of continued chest tightness and labile blood pressure.  He stated that emergency department visit on 11/11/2021 with elevated blood pressure and systolic blood pressures greater than 200 at the time.  There were no changes made to his medications and he was advised to follow-up with close monitoring by cardiology.  He states that his chest tightness is not daily and he takes nitro regularly with minimal relief but he  is only taken 1 tablet at a time.  This is associated with some shortness of breath and he states its been ongoing since he had a stent placed in June of this year.  Home Medications    Current Outpatient Medications  Medication Sig Dispense Refill   aspirin EC 81 MG tablet Take 1 tablet (81 mg total) by mouth daily. 90 tablet 3   atorvastatin (LIPITOR) 80 MG tablet Take 1 tablet (80 mg total) by mouth daily. 90 tablet 3   baclofen (LIORESAL) 10 MG tablet TAKE 1/2 TO 1 (ONE-HALF TO ONE) TABLET BY MOUTH TWICE DAILY AS NEEDED FOR MUSCLE SPASM (LEG  PAIN) 180 tablet 0   BD VEO INSULIN SYRINGE U/F 31G X 15/64" 0.3 ML MISC USE AS DIRECTED TWICE DAILY 200 each 3   clopidogrel (PLAVIX) 75 MG tablet Take 1 tablet (75 mg total) by mouth daily. 90 tablet 3   ezetimibe (ZETIA) 10 MG tablet Take 1 tablet (10 mg total) by mouth daily. 90 tablet 3   glucose blood (ACCU-CHEK AVIVA PLUS) test strip Use to check blood sugar up to 3 x daily 100 each 12  insulin lispro protamine-lispro (HUMALOG MIX 75/25) (75-25) 100 UNIT/ML SUSP injection INJECT 17 UNITS SUBCUTANEOUSLY TWICE DAILY WITH A MEAL. MAX OF 50 UNITS DAILY. 30 mL 3   isosorbide mononitrate (IMDUR) 60 MG 24 hr tablet Take 1 tablet (60 mg total) by mouth daily. 90 tablet 1   lisinopril (ZESTRIL) 10 MG tablet Take 1 tablet (10 mg total) by mouth daily. 90 tablet 3   metFORMIN (GLUCOPHAGE) 1000 MG tablet TAKE 1 TABLET BY MOUTH TWICE DAILY WITH A MEAL 180 tablet 3   metoprolol succinate (TOPROL-XL) 25 MG 24 hr tablet Take 1 tablet (25 mg total) by mouth daily. 30 tablet 1   nitroGLYCERIN (NITROSTAT) 0.4 MG SL tablet Place 1 tablet (0.4 mg total) under the tongue every 5 (five) minutes as needed for chest pain. 25 tablet 3   Syringe, Disposable, 3 ML MISC Use to inject humalog mix 75/25 17u BID 200 each 5   No current facility-administered medications for this visit.     Family History    Family History  Problem Relation Age of Onset   CAD Mother     Throat cancer Father    CAD Brother    Diabetes Neg Hx    Cancer Neg Hx    Prostate cancer Neg Hx    Colon cancer Neg Hx    He indicated that his mother is deceased. He indicated that his father is deceased. He indicated that his brother is alive. He indicated that the status of his neg hx is unknown.  Social History    Social History   Socioeconomic History   Marital status: Single    Spouse name: Not on file   Number of children: Not on file   Years of education: Not on file   Highest education level: Not on file  Occupational History   Occupation: Former Naval architecttruck driver / Production designer, theatre/television/filmfork lift operator  Tobacco Use   Smoking status: Former    Types: Cigarettes    Quit date: 08/06/1975    Years since quitting: 46.7   Smokeless tobacco: Never  Vaping Use   Vaping Use: Never used  Substance and Sexual Activity   Alcohol use: No   Drug use: No   Sexual activity: Yes  Other Topics Concern   Not on file  Social History Narrative   Not on file   Social Determinants of Health   Financial Resource Strain: Low Risk  (05/20/2021)   Overall Financial Resource Strain (CARDIA)    Difficulty of Paying Living Expenses: Not hard at all  Food Insecurity: No Food Insecurity (05/20/2021)   Hunger Vital Sign    Worried About Running Out of Food in the Last Year: Never true    Ran Out of Food in the Last Year: Never true  Transportation Needs: No Transportation Needs (05/20/2021)   PRAPARE - Administrator, Civil ServiceTransportation    Lack of Transportation (Medical): No    Lack of Transportation (Non-Medical): No  Physical Activity: Sufficiently Active (05/20/2021)   Exercise Vital Sign    Days of Exercise per Week: 4 days    Minutes of Exercise per Session: 40 min  Stress: No Stress Concern Present (05/20/2021)   [employer] - Occupational Stress Questionnaire    Feeling of Stress : Not at all  Social Connections: Socially Isolated (05/20/2021)   Social Connection and Isolation Panel  [NHANES]    Frequency of Communication with Friends and Family: Three times a week    Frequency of Social Gatherings with Friends  and Family: Three times a week    Attends Religious Services: Never    Active Member of Clubs or Organizations: No    Attends Banker Meetings: Never    Marital Status: Never married  Catering manager Violence: Not on file     Review of Systems    General:  No chills, fever, night sweats or weight changes.  Cardiovascular:  Endorses chest pain, dyspnea on exertion, edema, but deneis orthopnea, palpitations, paroxysmal nocturnal dyspnea. Dermatological: No rash, lesions/masses Respiratory: No cough, endorses exertional dyspnea Urologic: No hematuria, dysuria Abdominal:   No nausea, vomiting, diarrhea, bright red blood per rectum, melena, or hematemesis Neurologic:  No visual changes, wkns, changes in mental status. All other systems reviewed and are otherwise negative except as noted above.   Physical Exam    VS:  BP (!) 150/94 (BP Location: Left Arm, Patient Position: Sitting, Cuff Size: Large)   Pulse 75   Ht 5\' 8"  (1.727 m)   Wt 285 lb (129.3 kg)   SpO2 98%   BMI 43.33 kg/m  , BMI Body mass index is 43.33 kg/m.     GEN: Well nourished, well developed, in no acute distress. HEENT: normal. Neck: Supple, no JVD, carotid bruits, or masses. Cardiac: RRR, no murmurs, rubs, or gallops. No clubbing, cyanosis, edema.  Radials/DP/PT 2+ and equal bilaterally.  Respiratory:  Respirations regular and unlabored, clear to auscultation bilaterally. GI: Soft, nontender, nondistended, BS + x 4. MS: no deformity or atrophy. Skin: warm and dry, no rash. Neuro:  Strength and sensation are intact. Psych: Normal affect.  Accessory Clinical Findings    ECG personally reviewed by me today-  No new tracings were completed today.  Lab Results  Component Value Date   WBC 3.4 (L) 04/08/2022   HGB 14.7 04/08/2022   HCT 45.0 04/08/2022   MCV 85.6  04/08/2022   PLT 203 04/08/2022   Lab Results  Component Value Date   CREATININE 0.86 04/08/2022   BUN 11 04/08/2022   NA 133 (L) 04/08/2022   K 3.5 04/08/2022   CL 100 04/08/2022   CO2 21 (L) 04/08/2022   Lab Results  Component Value Date   ALT 43 12/07/2021   AST 36 12/07/2021   ALKPHOS 43 12/07/2021   BILITOT 0.6 12/07/2021   Lab Results  Component Value Date   CHOL 88 12/08/2021   HDL 34 (L) 12/08/2021   LDLCALC 41 12/08/2021   TRIG 63 12/08/2021   CHOLHDL 2.6 12/08/2021    Lab Results  Component Value Date   HGBA1C 6.2 (A) 12/22/2021    Assessment & Plan   1.  Chest pain and chest tightness which is requiring Nitrostat on occasion.  Patient states he only takes 1 if not taken with movement.  He did present to the emergency department with hypertensive urgency on 04/08/2022.  EKG done in the emergency department revealed sinus rhythm with a rate 88 and T wave inversions in inferior and lateral leads.  Patient scheduled for Lexiscan stress testing.   2.  Essential hypertension with blood pressure of 150/94 today.  Patient states his blood pressures have been labile which did indicate a visit to the emergency department on 04/08/2022.  He is currently on lisinopril 10 mg daily which has been increased to 20 mg daily today (he requests a refill of his lisinopril be sent to the pharmacy of choice) we will continue with his Toprol-XL 25 mg daily.  3.  Coronary artery disease status post  CABG with angina.  He has had increasing in frequency and intensity of atypical chest pain he originally thought was consistent with musculoskeletal etiology.  He was previously evaluated in the emergency department for hypertensive urgency which revealed high-sensitivity troponin of 36.  He has just undergone a left heart catheterization on 12/10/2021 which showed 100% CTO of the proximal left circumflex after OM1-R1, 90% followed by 100% CTO of the LAD to his P1, widely patent ostial RI, culprit  lesion 95% focal and appearing proximal RCA, successful scoring balloon angioplasty followed by DES of the proximal RCA with 95% reduced to 0% stenosis.  Distal LAD has tandem 50% lesions in the distal left circumflex with retrograde flow revealed 80% stenosis prior to anastomosis.  The 2 of 3 grafts are patent SVG to the OM, LIMA to the LAD with flush occlusion of the SVG-R1, normal LV DP.  With continued use of Nitrostat he  has been scheduled for stress testing.  He has been continued on aspirin 81 mg daily, Plavix 75 mg daily, and isosorbide mononitrate 60 mg daily as well as his Nitrostat 0.4 mg sublingual as needed.  4.  Mixed hyperlipidemia with LDL of 41 on 12/08/2021.  He has been continued on atorvastatin 80 mg daily and Zetia 10 mg daily.  5.  Type 2 diabetes with A1c of 6.2.  He has been continued on insulin.  This continues to be followed by his PCP.  6.  Patient return to clinic to see MD/APP once of stress testing is completed.  Jamy Cleckler, NP 04/12/2022, 3:21 PM

## 2022-04-12 NOTE — Patient Instructions (Signed)
Medication Instructions:  Your physician has recommended you make the following change in your medication:   INCREASE Lisinopril to 10 mg twice a day  *If you need a refill on your cardiac medications before your next appointment, please call your pharmacy*   Lab Work: None  If you have labs (blood work) drawn today and your tests are completely normal, you will receive your results only by: [place] (if you have MyChart) OR A paper copy in the mail If you have any lab test that is abnormal or we need to change your treatment, we will call you to review the results.   Testing/Procedures: [place]  Your caregiver has ordered a Stress Test with nuclear imaging. The purpose of this test is to evaluate the blood supply to your heart muscle. This procedure is referred to as a "Non-Invasive Stress Test." This is because other than having an IV started in your vein, nothing is inserted or "invades" your body. Cardiac stress tests are done to find areas of poor blood flow to the heart by determining the extent of coronary artery disease (CAD). Some patients exercise on a treadmill, which naturally increases the blood flow to your heart, while others who are  unable to walk on a treadmill due to physical limitations have a pharmacologic/chemical stress agent called Lexiscan . This medicine will mimic walking on a treadmill by temporarily increasing your coronary blood flow.   Please note: these test may take anywhere between 2-4 hours to complete  PLEASE REPORT TO [place] AT THE FIRST DESK WILL DIRECT YOU WHERE TO GO  Date of Procedure:________________________  Arrival Time for Procedure:_____________________________  Instructions regarding medication:   _XX___ : Hold diabetes medication morning of procedure  _XX___:  TAKE ONLY 8 units of insulin the night before and take NONE the morning of your stress test.   _XX___:  HOLD Metformin the  night before and the morning of your procedure.   PLEASE NOTIFY THE OFFICE AT LEAST 66 HOURS IN ADVANCE IF YOU ARE UNABLE TO KEEP YOUR APPOINTMENT.  [phone] AND  PLEASE NOTIFY NUCLEAR MEDICINE AT [facility] AT LEAST 24 HOURS IN ADVANCE IF YOU ARE UNABLE TO KEEP YOUR APPOINTMENT. [phone]  How to prepare for your Myoview test:  Do not eat or drink after midnight No caffeine for 24 hours prior to test No smoking 24 hours prior to test. Your medication may be taken with water.  If your doctor stopped a medication because of this test, do not take that medication. Ladies, please do not wear dresses.  Skirts or pants are appropriate. Please wear a short sleeve shirt. No perfume, cologne or lotion. Wear comfortable walking shoes. No heels!   Follow-Up: At [facility], you and your health needs are our priority.  As part of our continuing mission to provide you with exceptional heart care, we have created designated Provider Care Teams.  These Care Teams include your primary Cardiologist (physician) and Advanced Practice Providers (APPs -  Physician Assistants and Nurse Practitioners) who all work together to provide you with the care you need, when you need it.  We recommend signing up for the patient portal called "MyChart".  Sign up information is provided on this After Visit Summary.  MyChart is used to connect with patients for Virtual Visits (Telemedicine).  Patients are able to view lab/test results, encounter notes, upcoming appointments, etc.  Non-urgent messages can be sent to your provider as well.  To learn more about what you can do with MyChart, go to [email or web address].    Your next appointment:   Follow up after stress test.   The format for your next appointment:   In Person  Provider:   Julien Nordmann, MD or Charlsie Quest, NP       Important Information About Sugar

## 2022-04-13 ENCOUNTER — Inpatient Hospital Stay: Payer: Medicaid Other | Admitting: Internal Medicine

## 2022-04-21 ENCOUNTER — Encounter
Admission: RE | Admit: 2022-04-21 | Discharge: 2022-04-21 | Disposition: A | Discharge status: Home / Self Care | Payer: Medicaid Other | Source: Ambulatory Visit | Attending: Cardiology | Admitting: Cardiology

## 2022-04-21 DIAGNOSIS — R072 Precordial pain: Secondary | ICD-10-CM | POA: Diagnosis not present

## 2022-04-21 LAB — NM MYOCAR MULTI W/SPECT W/WALL MOTION / EF
Base ST Depression (mm): 0 mm
LV dias vol: 108 mL (ref 62–150)
LV sys vol: 52 mL
Nuc Stress EF: 52 %
Peak HR: 102 {beats}/min
Percent HR: 64 %
Rest HR: 63 {beats}/min
Rest Nuclear Isotope Dose: 11 mCi
SDS: 0
SRS: 0
SSS: 2
Stress Nuclear Isotope Dose: 33 mCi
TID: 1.03

## 2022-04-21 MED ORDER — TECHNETIUM TC 99M TETROFOSMIN IV KIT
10.9800 | PACK | Freq: Once | INTRAVENOUS | Status: AC | PRN
  Administered 2022-04-21: 10.98 via INTRAVENOUS

## 2022-04-21 MED ORDER — REGADENOSON 0.4 MG/5ML IV SOLN
0.4000 mg | Freq: Once | INTRAVENOUS | Status: AC
  Administered 2022-04-21: 0.4 mg via INTRAVENOUS

## 2022-04-21 MED ORDER — TECHNETIUM TC 99M TETROFOSMIN IV KIT
33.0000 | PACK | Freq: Once | INTRAVENOUS | Status: AC | PRN
  Administered 2022-04-21: 33 via INTRAVENOUS

## 2022-04-28 ENCOUNTER — Telehealth: Payer: Self-pay | Admitting: Cardiology

## 2022-04-28 NOTE — Telephone Encounter (Signed)
Please call patient to explain why he needs to have echocardiogram.

## 2022-04-28 NOTE — Telephone Encounter (Signed)
-----   Message from Gerrie Nordmann, NP sent at 04/22/2022  4:07 PM EDT ----- Low risk study. No evidence of ischemia. Recommended to repeat echocardiogram to recheck heart function, needs to be scheduled. No other changes to be made at this time.

## 2022-04-28 NOTE — Telephone Encounter (Signed)
Spoke with patient and reviewed his results and recommendations. Appointment scheduled for echo. He verbalized understanding with no further questions at this time.

## 2022-04-29 ENCOUNTER — Ambulatory Visit: Payer: Medicaid Other | Admitting: Cardiology

## 2022-05-06 ENCOUNTER — Other Ambulatory Visit: Payer: Self-pay | Admitting: *Deleted

## 2022-05-06 DIAGNOSIS — R072 Precordial pain: Secondary | ICD-10-CM

## 2022-05-10 ENCOUNTER — Other Ambulatory Visit: Payer: Self-pay | Admitting: Physician Assistant

## 2022-05-10 DIAGNOSIS — I2510 Atherosclerotic heart disease of native coronary artery without angina pectoris: Secondary | ICD-10-CM

## 2022-05-10 DIAGNOSIS — I10 Essential (primary) hypertension: Secondary | ICD-10-CM

## 2022-05-11 ENCOUNTER — Emergency Department: Payer: Medicaid Other

## 2022-05-11 ENCOUNTER — Other Ambulatory Visit: Payer: Self-pay

## 2022-05-11 ENCOUNTER — Encounter: Payer: Self-pay | Admitting: Emergency Medicine

## 2022-05-11 DIAGNOSIS — Z7982 Long term (current) use of aspirin: Secondary | ICD-10-CM | POA: Diagnosis not present

## 2022-05-11 DIAGNOSIS — R0789 Other chest pain: Secondary | ICD-10-CM | POA: Diagnosis not present

## 2022-05-11 DIAGNOSIS — R0689 Other abnormalities of breathing: Secondary | ICD-10-CM | POA: Diagnosis not present

## 2022-05-11 DIAGNOSIS — I10 Essential (primary) hypertension: Secondary | ICD-10-CM | POA: Insufficient documentation

## 2022-05-11 DIAGNOSIS — Z7984 Long term (current) use of oral hypoglycemic drugs: Secondary | ICD-10-CM | POA: Diagnosis not present

## 2022-05-11 DIAGNOSIS — R000 Tachycardia, unspecified: Secondary | ICD-10-CM | POA: Diagnosis not present

## 2022-05-11 DIAGNOSIS — Z79899 Other long term (current) drug therapy: Secondary | ICD-10-CM | POA: Insufficient documentation

## 2022-05-11 DIAGNOSIS — Z794 Long term (current) use of insulin: Secondary | ICD-10-CM | POA: Diagnosis not present

## 2022-05-11 DIAGNOSIS — I2510 Atherosclerotic heart disease of native coronary artery without angina pectoris: Secondary | ICD-10-CM | POA: Diagnosis not present

## 2022-05-11 DIAGNOSIS — Z955 Presence of coronary angioplasty implant and graft: Secondary | ICD-10-CM | POA: Diagnosis not present

## 2022-05-11 DIAGNOSIS — I959 Hypotension, unspecified: Secondary | ICD-10-CM | POA: Diagnosis not present

## 2022-05-11 DIAGNOSIS — R42 Dizziness and giddiness: Secondary | ICD-10-CM | POA: Diagnosis not present

## 2022-05-11 DIAGNOSIS — E119 Type 2 diabetes mellitus without complications: Secondary | ICD-10-CM | POA: Insufficient documentation

## 2022-05-11 DIAGNOSIS — R079 Chest pain, unspecified: Secondary | ICD-10-CM | POA: Diagnosis not present

## 2022-05-11 LAB — CBC
HCT: 41.5 % (ref 39.0–52.0)
Hemoglobin: 13.8 g/dL (ref 13.0–17.0)
MCH: 27.8 pg (ref 26.0–34.0)
MCHC: 33.3 g/dL (ref 30.0–36.0)
MCV: 83.7 fL (ref 80.0–100.0)
Platelets: 171 10*3/uL (ref 150–400)
RBC: 4.96 MIL/uL (ref 4.22–5.81)
RDW: 13.2 % (ref 11.5–15.5)
WBC: 5.1 10*3/uL (ref 4.0–10.5)
nRBC: 0 % (ref 0.0–0.2)

## 2022-05-11 NOTE — ED Triage Notes (Signed)
Pt to ED from home via EMS c/o HTN and chest pressure tonight.  BP up to 226/118 at home and states feeling hot, dizzy, and sweaty.  States was awake and watching tv when it started.  Hx of triple bypass and stent placements.  Pt A&Ox4, chest rise even and unlabored, skin dry, and in NAD at this time.

## 2022-05-11 NOTE — ED Triage Notes (Signed)
EMS brings pt in for c/o heart racing accomp by dizziness x 45min; symptoms have since resided

## 2022-05-12 ENCOUNTER — Emergency Department: Payer: Medicaid Other

## 2022-05-12 ENCOUNTER — Emergency Department
Admission: EM | Admit: 2022-05-12 | Discharge: 2022-05-12 | Disposition: A | Discharge status: Home / Self Care | Payer: Medicaid Other | Attending: Emergency Medicine | Admitting: Emergency Medicine

## 2022-05-12 ENCOUNTER — Other Ambulatory Visit: Payer: Self-pay | Admitting: Family Medicine

## 2022-05-12 ENCOUNTER — Other Ambulatory Visit: Payer: Self-pay

## 2022-05-12 DIAGNOSIS — G8929 Other chronic pain: Secondary | ICD-10-CM

## 2022-05-12 DIAGNOSIS — R079 Chest pain, unspecified: Secondary | ICD-10-CM

## 2022-05-12 LAB — BASIC METABOLIC PANEL
Anion gap: 11 (ref 5–15)
BUN: 21 mg/dL (ref 8–23)
CO2: 21 mmol/L — ABNORMAL LOW (ref 22–32)
Calcium: 9.2 mg/dL (ref 8.9–10.3)
Chloride: 101 mmol/L (ref 98–111)
Creatinine, Ser: 0.93 mg/dL (ref 0.61–1.24)
GFR, Estimated: >60 mL/min (ref >60)
Glucose, Bld: 89 mg/dL (ref 70–99)
Potassium: 3.5 mmol/L (ref 3.5–5.1)
Sodium: 133 mmol/L — ABNORMAL LOW (ref 135–145)

## 2022-05-12 LAB — TROPONIN I (HIGH SENSITIVITY)
Troponin I (High Sensitivity): 53 ng/L — ABNORMAL HIGH (ref <18)
Troponin I (High Sensitivity): 54 ng/L — ABNORMAL HIGH (ref <18)

## 2022-05-12 LAB — MAGNESIUM: Magnesium: 1.8 mg/dL (ref 1.7–2.4)

## 2022-05-12 LAB — TSH: TSH: 1.998 u[IU]/mL (ref 0.350–4.500)

## 2022-05-12 NOTE — ED Notes (Signed)
Pt is A&Ox4. Pt has HX of diabetes, Bypass, and stents. Pt was watching TV this evening and began having chest pressure and a racing heart. Pt's chest pressure lasted for about 2 hours and heart racing lasted for approximately 30 minutes. Pt says he is feeling better at this time.

## 2022-05-12 NOTE — Telephone Encounter (Signed)
Requested Prescriptions  Pending Prescriptions Disp Refills   baclofen (LIORESAL) 10 MG tablet [Pharmacy Med Name: Baclofen 10 MG Oral Tablet] 180 tablet 0    Sig: TAKE ONE-HALF TO ONE TABLET BY MOUTH TWICE DAILY AS NEEDED FOR MUSCLE SPASM (LEG PAIN)     Analgesics:  Muscle Relaxants - baclofen Passed - 05/12/2022 12:22 PM      Passed - Cr in normal range and within 180 days    Creat  Date Value Ref Range Status  07/23/2021 1.14 0.70 - 1.35 mg/dL Final   Creatinine, Ser  Date Value Ref Range Status  05/11/2022 0.93 0.61 - 1.24 mg/dL Final         Passed - eGFR is 30 or above and within 180 days    GFR, Est African American  Date Value Ref Range Status  07/15/2020 108 > OR = 60 mL/min/1.22m Final   GFR, Est Non African American  Date Value Ref Range Status  07/15/2020 93 > OR = 60 mL/min/1.726mFinal   GFR, Estimated  Date Value Ref Range Status  05/11/2022 >60 >60 mL/min Final    Comment:    (NOTE) Calculated using the CKD-EPI Creatinine Equation (2021)    eGFR  Date Value Ref Range Status  07/23/2021 72 > OR = 60 mL/min/1.7364minal    Comment:    The eGFR is based on the CKD-EPI 2021 equation. To calculate  the new eGFR from a previous Creatinine or Cystatin C result, go to https://www.kidney.org/professionals/ kdoqi/gfr%5Fcalculator          Passed - Valid encounter within last 6 months    Recent Outpatient Visits           4 months ago DM (diabetes mellitus), type 2 [facility]   9 months ago [place] physical exam   [place]   1 year ago DM (diabetes mellitus), type 2 with peripheral vascular [facility]   [facility]   2 years ago DM (diabetes mellitus), type 2 with  peripheral vascular complications [facility]       Future Appointments             In 2 days [place] Doughty[facility] 1 month HamGerrie NordmannP [place] 2 months [place]Devonne DoughtyO [facility]

## 2022-05-12 NOTE — ED Provider Notes (Signed)
[facility] Provider Note    Event Date/Time   First MD Initiated Contact with Patient 05/12/22 0245     (approximate)   History   Hypertension and Chest Pain   HPI  Craig Harvey is a 66 y.o. male with history of coronary artery disease, hypertension, hyperlipidemia, diabetes, obesity who presents to the emergency department with left-sided chest tightness without radiation, shortness of breath that started about 6 hours ago and lasted for 2 hours.  States when he normally gets his pain he takes a muscle relaxer but did not take 1 tonight.  States pain resolved spontaneously and he has not had any pain while in the waiting room.  Also reports he felt like his heart was racing but did not check his heart rate.  No nausea, vomiting, fevers, cough, lower extremity swelling or pain, dizziness, diaphoresis.  Feeling back to normal now.  Patient states that this feels like his musculoskeletal pain rather than his anginal equivalent.   History provided by patient.    Past Medical History:  Diagnosis Date   CAD (coronary artery disease)    a. 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 cardiac cath 01/09/2015: ostLAD 70:, mLAD 99%, ost to pLCx 95%, mLCx 80%, LVEF nl, recommend CABGl; c. s/p 3v CABG 01/14/15 LIMA-LAD, SVG-OM, SVG-Ramus   History of echocardiogram    a. 01/2015 Echo: EF 60-65%, no rwma, nl RV fxn. Nl PASP.   Hyperlipidemia LDL goal <70    Hypertension    Morbid obesity (HCC)    OSA (obstructive sleep apnea)    Type II diabetes mellitus (HCC)     Past Surgical History:  Procedure Laterality Date   CARDIAC CATHETERIZATION N/A 01/09/2015   Procedure: Left Heart Cath and Coronary Angiography;  Surgeon: Antonieta Iba, MD;  Location: [facility] INVASIVE CV LAB;   Service: Cardiovascular;  Laterality: N/A;   CORONARY ARTERY BYPASS GRAFT N/A 01/14/2015   Procedure: CORONARY ARTERY BYPASS GRAFTING (CABG), ON PUMP, TIMES THREE, USING LEFT INTERNAL MAMMARY ARTERY, RIGHT GREATER SAPHENOUS VEIN HARVESTED ENDOSCOPICALLY;  Surgeon: Kerin Perna, MD;  Location: [facility] OR;  Service: Open Heart Surgery;  Laterality: N/A;   CORONARY STENT INTERVENTION N/A 12/10/2021   Procedure: CORONARY STENT INTERVENTION;  Surgeon: Marykay Lex, MD;  Location: [facility] INVASIVE CV LAB;  Service: Cardiovascular;  Laterality: N/A;   HERNIA REPAIR     LEFT HEART CATH AND CORONARY ANGIOGRAPHY N/A 12/10/2021   Procedure: LEFT HEART CATH AND CORONARY ANGIOGRAPHY;  Surgeon: Marykay Lex, MD;  Location: [facility] INVASIVE CV LAB;  Service: Cardiovascular;  Laterality: N/A;   TEE WITHOUT CARDIOVERSION N/A 01/14/2015   Procedure: TRANSESOPHAGEAL ECHOCARDIOGRAM (TEE);  Surgeon: Kerin Perna, MD;  Location: [facility] OR;  Service: Open Heart Surgery;  Laterality: N/A;    MEDICATIONS:  Prior to Admission medications   Medication Sig Start Date End Date Taking? Authorizing Provider  aspirin EC 81 MG tablet Take 1 tablet (81 mg total) by mouth daily. 11/18/17   Antonieta Iba, MD  atorvastatin (LIPITOR) 80 MG tablet Take 1 tablet (80 mg total) by mouth daily. 08/31/21   Antonieta Iba, MD  baclofen (LIORESAL) 10 MG tablet TAKE 1/2 TO 1 (ONE-HALF TO ONE) TABLET BY MOUTH TWICE DAILY AS NEEDED FOR MUSCLE SPASM (LEG  PAIN) 01/12/22  Karamalegos, Alexander J, DO  BD VEO INSULIN SYRINGE U/F 31G X 15/64" 0.3 ML MISC USE AS DIRECTED TWICE DAILY 10/21/21   Althea Charon, Netta Neat, DO  clopidogrel (PLAVIX) 75 MG tablet Take 1 tablet (75 mg total) by mouth daily. 07/23/21   Karamalegos, Netta Neat, DO  ezetimibe (ZETIA) 10 MG tablet Take 1 tablet (10 mg total) by mouth daily. 07/23/21   Karamalegos, Netta Neat, DO  glucose blood (ACCU-CHEK AVIVA PLUS) test strip Use to check blood sugar up to 3 x daily 07/22/20    Karamalegos, Netta Neat, DO  insulin lispro protamine-lispro (HUMALOG MIX 75/25) (75-25) 100 UNIT/ML SUSP injection INJECT 17 UNITS SUBCUTANEOUSLY TWICE DAILY WITH A MEAL. MAX OF 50 UNITS DAILY. 07/23/21   Karamalegos, Netta Neat, DO  isosorbide mononitrate (IMDUR) 60 MG 24 hr tablet Take 1 tablet (60 mg total) by mouth daily. 02/04/22   Karamalegos, Netta Neat, DO  lisinopril (ZESTRIL) 10 MG tablet Take 1 tablet (10 mg total) by mouth in the morning and at bedtime. 04/12/22   Charlsie Quest, NP  metFORMIN (GLUCOPHAGE) 1000 MG tablet TAKE 1 TABLET BY MOUTH TWICE DAILY WITH A MEAL 07/23/21   Karamalegos, Netta Neat, DO  metoprolol succinate (TOPROL-XL) 25 MG 24 hr tablet Take 1 tablet by mouth once daily 05/10/22   Dunn, Raymon Mutton, PA-C  nitroGLYCERIN (NITROSTAT) 0.4 MG SL tablet Place 1 tablet (0.4 mg total) under the tongue every 5 (five) minutes as needed for chest pain. 03/16/22   Antonieta Iba, MD  Syringe, Disposable, 3 ML MISC Use to inject humalog mix 75/25 17u BID 01/25/19   Smitty Cords, DO    Physical Exam   Triage Vital Signs: ED Triage Vitals  Enc Vitals Group     BP 05/11/22 2322 (!) 153/96     Pulse Rate 05/11/22 2322 81     Resp 05/11/22 2322 14     Temp 05/11/22 2322 98.3 F (36.8 C)     Temp Source 05/11/22 2322 Oral     SpO2 05/11/22 2309 95 %     Weight 05/11/22 2323 280 lb (127 kg)     Height 05/11/22 2323 5\' 8"  (1.727 m)     Head Circumference --      Peak Flow --      Pain Score 05/11/22 2323 8     Pain Loc --      Pain Edu? --      Excl. in GC? --     Most recent vital signs: Vitals:   05/12/22 0300 05/12/22 0330  BP: 137/88 125/85  Pulse: 61 (!) 58  Resp: 14 16  Temp:    SpO2: 97% 96%    CONSTITUTIONAL: Alert and oriented and responds appropriately to questions. Well-appearing; well-nourished HEAD: Normocephalic, atraumatic EYES: Conjunctivae clear, pupils appear equal, sclera nonicteric ENT: normal nose; moist mucous membranes NECK:  Supple, normal ROM CARD: RRR; S1 and S2 appreciated; no murmurs, no clicks, no rubs, no gallops RESP: Normal chest excursion without splinting or tachypnea; breath sounds clear and equal bilaterally; no wheezes, no rhonchi, no rales, no hypoxia or respiratory distress, speaking full sentences ABD/GI: Normal bowel sounds; non-distended; soft, non-tender, no rebound, no guarding, no peritoneal signs BACK: The back appears normal EXT: Normal ROM in all joints; no deformity noted, no edema; no cyanosis, no calf tenderness or calf swelling SKIN: Normal color for age and race; warm; no rash on exposed skin NEURO: Moves all extremities equally, normal speech PSYCH: The patient's mood  and manner are appropriate.   ED Results / Procedures / Treatments   LABS: (all labs ordered are listed, but only abnormal results are displayed) Labs Reviewed  BASIC METABOLIC PANEL - Abnormal; Notable for the following components:      Result Value   Sodium 133 (*)    CO2 21 (*)    All other components within normal limits  TROPONIN I (HIGH SENSITIVITY) - Abnormal; Notable for the following components:   Troponin I (High Sensitivity) 53 (*)    All other components within normal limits  TROPONIN I (HIGH SENSITIVITY) - Abnormal; Notable for the following components:   Troponin I (High Sensitivity) 54 (*)    All other components within normal limits  CBC  MAGNESIUM  TSH     EKG:  EKG Interpretation  Date/Time:  Tuesday May 11 2022 23:16:35 EST Ventricular Rate:  81 PR Interval:    QRS Duration: 96 QT Interval:  364 QTC Calculation: 422 R Axis:   75 Text Interpretation: Normal sinus rhythm Anterior infarct (cited on or before 06-Jan-2022) T wave abnormality, consider inferior ischemia unchanged compared to prior Abnormal ECG No significant change since last tracing Reconfirmed by Rochele Raring [phone]) on 05/12/2022 3:40:45 AM         RADIOLOGY: My personal review and interpretation of  imaging: This x-ray clear.  I have personally reviewed all radiology reports.   DG Chest Port 1 View  Result Date: 05/12/2022 CLINICAL DATA:  Chest pain EXAM: PORTABLE CHEST 1 VIEW COMPARISON:  04/08/2022 FINDINGS: Status post median sternotomy and CABG. Cardiac and mediastinal contours are within normal limits given AP technique. No focal pulmonary opacity. No pleural effusion or pneumothorax. No acute osseous abnormality. IMPRESSION: No acute cardiopulmonary process. Electronically Signed   By: Wiliam Ke M.D.   On: 05/12/2022 00:26     PROCEDURES:  Critical Care performed: No      .1-3 Lead EKG Interpretation  Performed by: Ilyana Manuele, Layla Maw, DO Authorized by: Maryse Brierley, Layla Maw, DO     Interpretation: normal     ECG rate:  61   ECG rate assessment: normal     Rhythm: sinus rhythm     Ectopy: none     Conduction: normal       IMPRESSION / MDM / ASSESSMENT AND PLAN / ED COURSE  I reviewed the triage vital signs and the nursing notes.    Patient here with chest pain.  History of the same.  Now asymptomatic.  The patient is on the cardiac monitor to evaluate for evidence of arrhythmia and/or significant heart rate changes.   DIFFERENTIAL DIAGNOSIS (includes but not limited to):   ACS, PE, dissection, CHF, pneumonia, pneumothorax, musculoskeletal pain, anemia, electrolyte derangement, arrhythmia   Patient's presentation is most consistent with acute presentation with potential threat to life or bodily function.   PLAN: Work-up initiated from triage.  Normal hemoglobin, no significant electrolyte derangement.  First troponin is 53.  Patient chronically has elevated troponins.  Were in the 100s in June, 30s in July and October.  TSH normal.  Chest x-ray reviewed and interpreted by myself and the radiologist and shows no acute abnormality.  EKG nonischemic compared to previous.  Patient asymptomatic.  Second troponin pending.  Will monitor on cardiac  monitoring.   MEDICATIONS GIVEN IN ED: Medications - No data to display   ED COURSE: Patient's second troponin is flat.  No events seen on cardiac monitoring.  Given asymptomatic with no changes in EKG, significant changes  in his troponin with atypical sounding chest pain, will discharge home with close outpatient follow-up.   CONSULTS: Admission considered given patient's risk factors, history of CAD but given flat troponins, no ischemic changes in his EKG patient currently asymptomatic with history of atypical chest pain, I feel he is safe for discharge home with close outpatient follow-up.  I did discuss the option of admission to the hospital but patient states he would like to go home and will call his cardiologist in the morning.  He has been following up with them closely over the past few weeks.  Patient had a low risk stress test in October 2023.  Repeat echocardiogram scheduled as an outpatient.   OUTSIDE RECORDS REVIEWED: Reviewed multiple recent cardiology notes.   Cath 12/10/21:  SUMMARY Severe Native CAD:  100% CTO of proximal LCx after OM1/RI, 90% followed 100% CTO of LAD after SP1;  Widely patent ostial RI CULPRIT LESION 95% focal (napkin ring) proximal RCA Successful scoring balloon (sequential 3.0 mm & 3.5 mm ScoreFlex) angioplasty followed by => DES PCI of proximal RCA Onyx Frontier DES 3.5 mm x 15 mm postdilated to 4.1 mm. 95% reduced to 0% stenosis; TIMI-3 flow pre and post.   Distal LAD has tandem 50% lesions, distal LCx with retrograde flow reveals 80% stenosis prior to anastomosis. 2 of 3 grafts patent: SVG-OM, LIMA-LAD with flush occlusion of SVG-RI NORMAL LVEDP.  LV gram not performed-Echo done today.     PLAN OF CARE:  Monitor overnight post PCI, continue aggressive cardiovascular risk factor modification with guideline medical therapy.  Anticipate morning discharge.  Phase 2 cardiac rehab ordered    FINAL CLINICAL IMPRESSION(S) / ED DIAGNOSES   Final  diagnoses:  Nonspecific chest pain     Rx / DC Orders   ED Discharge Orders     None        Note:  This document was prepared using Dragon voice recognition software and may include unintentional dictation errors.   Lydia Meng, Layla Maw, DO 05/12/22 [phone]

## 2022-05-14 ENCOUNTER — Ambulatory Visit (INDEPENDENT_AMBULATORY_CARE_PROVIDER_SITE_OTHER): Payer: Medicaid Other | Admitting: Family Medicine

## 2022-05-14 ENCOUNTER — Encounter: Payer: Self-pay | Admitting: Family Medicine

## 2022-05-14 VITALS — BP 154/80 | HR 85 | Ht 68.0 in | Wt 279.0 lb

## 2022-05-14 DIAGNOSIS — I2510 Atherosclerotic heart disease of native coronary artery without angina pectoris: Secondary | ICD-10-CM

## 2022-05-14 DIAGNOSIS — M79605 Pain in left leg: Secondary | ICD-10-CM | POA: Diagnosis not present

## 2022-05-14 DIAGNOSIS — G8929 Other chronic pain: Secondary | ICD-10-CM | POA: Diagnosis not present

## 2022-05-14 DIAGNOSIS — I25118 Atherosclerotic heart disease of native coronary artery with other forms of angina pectoris: Secondary | ICD-10-CM | POA: Diagnosis not present

## 2022-05-14 DIAGNOSIS — Z23 Encounter for immunization: Secondary | ICD-10-CM | POA: Diagnosis not present

## 2022-05-14 DIAGNOSIS — I10 Essential (primary) hypertension: Secondary | ICD-10-CM | POA: Diagnosis not present

## 2022-05-14 MED ORDER — METOPROLOL SUCCINATE ER 25 MG PO TB24: 25.0000 mg | ORAL_TABLET | Freq: Every day | ORAL | 1 refills | Status: DC

## 2022-05-14 MED ORDER — BACLOFEN 10 MG PO TABS: ORAL_TABLET | ORAL | 1 refills | Status: DC

## 2022-05-14 MED ORDER — VALSARTAN 160 MG PO TABS: 160.0000 mg | ORAL_TABLET | Freq: Every day | ORAL | 1 refills | Status: DC

## 2022-05-14 NOTE — Patient Instructions (Addendum)
Thank you for coming to the office today.  Likely due to episodes of high blood pressure  Reassuring work up testing at hospital  STOP Lisinopril 10mg  twice a day START Valsartan 160mg  daily ONCE - 90 day ordered  Keep Metoprolol XL 25mg  daily  In future if BP still not controlled, we can increase Metoprolol  OR add a 3rd medication ONLY WHEN NEEDED high blood pressure.  Keep apt with Cardiologist / ECHO  Please schedule a Follow-up Appointment to: Return if symptoms worsen or fail to improve.  If you have any other questions or concerns, please feel free to call the office or send a message through MyChart. You may also schedule an earlier appointment if necessary.  Additionally, you may be receiving a survey about your experience at our office within a few days to 1 week by e-mail or mail. We value your feedback.  , DO [facility], 

## 2022-05-14 NOTE — Progress Notes (Signed)
Subjective:    Patient ID: Craig Harvey, male    DOB: 08/11/1955, 66 y.o.   MRN: [id number]  Craig Harvey is a 66 y.o. male presenting on 05/14/2022 for Hospitalization Follow-up and Hypertension   HPI  ED FOLLOW-UP VISIT  Hospital/Location: [facility] Date of ED Visit: 05/12/22  Reason for Presenting to ED: Chest Pain Pressure  FOLLOW-UP  - ED provider note and record have been reviewed - Patient presents today about 2 days after recent ED visit. Brief summary of recent course, patient had symptoms of chest pressure and pain associated with elevated BP and left him with chest soreness. Not having shortness of breath but difficulty catching deep breath. He was resting comfortable laying down watching game, and felt onset of symptoms with warm feeling. He called EMS and they did assessment and brought him in.  Presented to ED on 11/8, testing in ED with labs chemistry CBC TSH Troponin delta and EKG CXR, chronic elevated troponin in the past mild range, now lab showed 53 to 54 over several hours. EKG was non diagnostic and non ischemic compared to previous by ED report.  Discharged to home not admitted. He has upcoming ECHOCardiogram on 11/30 and Cardiology outpatient f/u after  He has risk factors with DM2, HYPERTENSION, HLD, OSA on CPAP  He has had some labile BP and elevation in BP cause his symptoms it seems.  History of Unstable angina s/p PCI stent Syncopal episode CAD / Vascular Disease / S/p CABG  Followed by [facility] Dr Rockey Situ. history of CABG 01/2015   He continues on DAPT with Plavix 75 and ASA 81  He is on Lisinopril 10mg  TWICE A DAY, Metoprolol XL 25mg  daily Also on Imdur 60mg   He is off HCTZ and K  Takes Baclofen AS NEEDED for some relief in chest wall muscles   Today reports overall has done well after discharge from ED. Symptoms of chest tightness muscle soreness  I have reviewed the discharge medication list, and have reconciled the current and  discharge medications today.      05/14/2022    2:22 PM 12/22/2021    2:09 PM 07/23/2021    7:59 AM  Depression screen PHQ 2/9  Decreased Interest 0 0 0  Down, Depressed, Hopeless 1 0 0  PHQ - 2 Score 1 0 0  Altered sleeping 0 0 0  Tired, decreased energy 1 0 0  Change in appetite 0 0 0  Feeling bad or failure about yourself  0 0 0  Trouble concentrating 0 0 0  Moving slowly or fidgety/restless 0 0 0  Suicidal thoughts 0 0 0  PHQ-9 Score 2 0 0  Difficult doing work/chores Not difficult at all Not difficult at all Somewhat difficult    Social History   Tobacco Use   Smoking status: Former    Types: Cigarettes    Quit date: 08/06/1975    Years since quitting: 46.8   Smokeless tobacco: Never  Vaping Use   Vaping Use: Never used  Substance Use Topics   Alcohol use: No   Drug use: No    Review of Systems Per HPI unless specifically indicated above     Objective:    BP (!) 154/80 (BP Location: Left Arm, Cuff Size: Normal)   Pulse 85   Ht 5\' 8"  (1.727 m)   Wt 279 lb (126.6 kg)   SpO2 99%   BMI 42.42 kg/m   Wt Readings from Last 3 Encounters:  05/14/22 279  lb (126.6 kg)  05/11/22 280 lb (127 kg)  04/12/22 285 lb (129.3 kg)    Physical Exam Vitals and nursing note reviewed.  Constitutional:      General: He is not in acute distress.    Appearance: He is well-developed. He is obese. He is not diaphoretic.     Comments: Well-appearing, comfortable, cooperative  HENT:     Head: Normocephalic and atraumatic.  Eyes:     General:        Right eye: No discharge.        Left eye: No discharge.     Conjunctiva/sclera: Conjunctivae normal.  Neck:     Thyroid: No thyromegaly.  Cardiovascular:     Rate and Rhythm: Normal rate and regular rhythm.     Pulses: Normal pulses.     Heart sounds: Normal heart sounds. No murmur heard. Pulmonary:     Effort: Pulmonary effort is normal. No respiratory distress.     Breath sounds: Normal breath sounds. No wheezing or rales.   Musculoskeletal:        General: Normal range of motion.     Cervical back: Normal range of motion and neck supple.  Lymphadenopathy:     Cervical: No cervical adenopathy.  Skin:    General: Skin is warm and dry.     Findings: No erythema or rash.  Neurological:     Mental Status: He is alert and oriented to person, place, and time. Mental status is at baseline.  Psychiatric:        Behavior: Behavior normal.     Comments: Well groomed, good eye contact, normal speech and thoughts     I have personally reviewed the radiology report from 05/12/22 Chest X-ray.   Study Result CLINICAL DATA: Chest pain  EXAM: PORTABLE CHEST 1 VIEW  COMPARISON: 04/08/2022  FINDINGS: Status post median sternotomy and CABG. Cardiac and mediastinal contours are within normal limits given AP technique. No focal pulmonary opacity. No pleural effusion or pneumothorax. No acute osseous abnormality.  IMPRESSION: No acute cardiopulmonary process.   Electronically Signed By: Wiliam Ke M.D. On: 05/12/2022 00:26  Results for orders placed or performed during the hospital encounter of 05/12/22  Basic metabolic panel  Result Value Ref Range   Sodium 133 (L) 135 - 145 mmol/L   Potassium 3.5 3.5 - 5.1 mmol/L   Chloride 101 98 - 111 mmol/L   CO2 21 (L) 22 - 32 mmol/L   Glucose, Bld 89 70 - 99 mg/dL   BUN 21 8 - 23 mg/dL   Creatinine, Ser 4.40 0.61 - 1.24 mg/dL   Calcium 9.2 8.9 - 34.7 mg/dL   GFR, Estimated >42 >59 mL/min   Anion gap 11 5 - 15  CBC  Result Value Ref Range   WBC 5.1 4.0 - 10.5 K/uL   RBC 4.96 4.22 - 5.81 MIL/uL   Hemoglobin 13.8 13.0 - 17.0 g/dL   HCT 56.3 87.5 - 64.3 %   MCV 83.7 80.0 - 100.0 fL   MCH 27.8 26.0 - 34.0 pg   MCHC 33.3 30.0 - 36.0 g/dL   RDW 32.9 51.8 - 84.1 %   Platelets 171 150 - 400 K/uL   nRBC 0.0 0.0 - 0.2 %  Magnesium  Result Value Ref Range   Magnesium 1.8 1.7 - 2.4 mg/dL  TSH  Result Value Ref Range   TSH 1.998 0.350 - 4.500 uIU/mL  Troponin  I (High Sensitivity)  Result Value Ref Range   Troponin I (  High Sensitivity) 53 (H) <18 ng/L  Troponin I (High Sensitivity)  Result Value Ref Range   Troponin I (High Sensitivity) 54 (H) <18 ng/L      Assessment & Plan:   Problem List Items Addressed This Visit     CAD (coronary artery disease), native coronary artery   Relevant Medications   valsartan (DIOVAN) 160 MG tablet   metoprolol succinate (TOPROL-XL) 25 MG 24 hr tablet   Chronic pain of left lower extremity   Relevant Medications   baclofen (LIORESAL) 10 MG tablet   Essential hypertension - Primary   Relevant Medications   valsartan (DIOVAN) 160 MG tablet   metoprolol succinate (TOPROL-XL) 25 MG 24 hr tablet   Other Visit Diagnoses     Needs flu shot       Relevant Orders   Flu Vaccine QUAD 47mo+IM (Fluarix, Fluzone & Alfiuria Quad PF) (Completed)      ED Follow up Chest pain rule out  Followed by Cardiology History of precordial pain vs unstable angina   Reassuring work up testing at hospital  Seems to be related to elevations in BP that trigger some of his symptoms  Will work on improved HYPERTENSION control today and he will continue w/ Cardiology  STOP Lisinopril 10mg  twice a day START Valsartan 160mg  daily ONCE - 90 day ordered  Keep Metoprolol XL 25mg  daily  In future if BP still not controlled, we can increase Metoprolol  Future consider Hydralazine add on AS NEEDED If need  Keep apt with Cardiologist / ECHO    Meds ordered this encounter  Medications   valsartan (DIOVAN) 160 MG tablet    Sig: Take 1 tablet (160 mg total) by mouth daily.    Dispense:  90 tablet    Refill:  1   metoprolol succinate (TOPROL-XL) 25 MG 24 hr tablet    Sig: Take 1 tablet (25 mg total) by mouth daily.    Dispense:  90 tablet    Refill:  1   baclofen (LIORESAL) 10 MG tablet    Sig: TAKE ONE-HALF TO ONE TABLET BY MOUTH TWICE DAILY AS NEEDED FOR MUSCLE SPASM (LEG PAIN)    Dispense:  180 tablet    Refill:  1       Follow up plan: Return if symptoms worsen or fail to improve.   [name], [facility] 05/14/2022, 2:28 PM

## 2022-05-16 ENCOUNTER — Other Ambulatory Visit: Payer: Self-pay

## 2022-05-16 ENCOUNTER — Emergency Department: Payer: Medicaid Other

## 2022-05-16 ENCOUNTER — Emergency Department
Admission: EM | Admit: 2022-05-16 | Discharge: 2022-05-16 | Disposition: A | Discharge status: Home / Self Care | Payer: Medicaid Other | Attending: Emergency Medicine | Admitting: Emergency Medicine

## 2022-05-16 ENCOUNTER — Encounter: Payer: Self-pay | Admitting: Emergency Medicine

## 2022-05-16 DIAGNOSIS — R0689 Other abnormalities of breathing: Secondary | ICD-10-CM | POA: Diagnosis not present

## 2022-05-16 DIAGNOSIS — R079 Chest pain, unspecified: Secondary | ICD-10-CM | POA: Diagnosis not present

## 2022-05-16 DIAGNOSIS — T68XXXA Hypothermia, initial encounter: Secondary | ICD-10-CM | POA: Diagnosis not present

## 2022-05-16 DIAGNOSIS — I2510 Atherosclerotic heart disease of native coronary artery without angina pectoris: Secondary | ICD-10-CM | POA: Insufficient documentation

## 2022-05-16 DIAGNOSIS — I10 Essential (primary) hypertension: Secondary | ICD-10-CM | POA: Diagnosis not present

## 2022-05-16 DIAGNOSIS — R0789 Other chest pain: Secondary | ICD-10-CM | POA: Diagnosis not present

## 2022-05-16 DIAGNOSIS — E119 Type 2 diabetes mellitus without complications: Secondary | ICD-10-CM | POA: Insufficient documentation

## 2022-05-16 DIAGNOSIS — Z794 Long term (current) use of insulin: Secondary | ICD-10-CM | POA: Diagnosis not present

## 2022-05-16 DIAGNOSIS — I959 Hypotension, unspecified: Secondary | ICD-10-CM | POA: Diagnosis not present

## 2022-05-16 LAB — HEPATIC FUNCTION PANEL
ALT: 24 U/L (ref 0–44)
AST: 26 U/L (ref 15–41)
Albumin: 3.9 g/dL (ref 3.5–5.0)
Alkaline Phosphatase: 52 U/L (ref 38–126)
Bilirubin, Direct: 0.2 mg/dL (ref 0.0–0.2)
Indirect Bilirubin: 0.4 mg/dL (ref 0.3–0.9)
Total Bilirubin: 0.6 mg/dL (ref 0.3–1.2)
Total Protein: 7.1 g/dL (ref 6.5–8.1)

## 2022-05-16 LAB — TROPONIN I (HIGH SENSITIVITY): Troponin I (High Sensitivity): 42 ng/L — ABNORMAL HIGH (ref <18)

## 2022-05-16 LAB — BASIC METABOLIC PANEL
Anion gap: 9 (ref 5–15)
BUN: 11 mg/dL (ref 8–23)
CO2: 22 mmol/L (ref 22–32)
Calcium: 9 mg/dL (ref 8.9–10.3)
Chloride: 101 mmol/L (ref 98–111)
Creatinine, Ser: 0.84 mg/dL (ref 0.61–1.24)
GFR, Estimated: >60 mL/min (ref >60)
Glucose, Bld: 107 mg/dL — ABNORMAL HIGH (ref 70–99)
Potassium: 3.7 mmol/L (ref 3.5–5.1)
Sodium: 132 mmol/L — ABNORMAL LOW (ref 135–145)

## 2022-05-16 LAB — CBC
HCT: 39.5 % (ref 39.0–52.0)
Hemoglobin: 13.1 g/dL (ref 13.0–17.0)
MCH: 27.9 pg (ref 26.0–34.0)
MCHC: 33.2 g/dL (ref 30.0–36.0)
MCV: 84.2 fL (ref 80.0–100.0)
Platelets: 181 10*3/uL (ref 150–400)
RBC: 4.69 MIL/uL (ref 4.22–5.81)
RDW: 13.2 % (ref 11.5–15.5)
WBC: 3.7 10*3/uL — ABNORMAL LOW (ref 4.0–10.5)
nRBC: 0 % (ref 0.0–0.2)

## 2022-05-16 LAB — LIPASE, BLOOD: Lipase: 37 U/L (ref 11–51)

## 2022-05-16 MED ORDER — FAMOTIDINE 20 MG PO TABS: 20.0000 mg | ORAL_TABLET | Freq: Two times a day (BID) | ORAL | 0 refills | Status: DC

## 2022-05-16 MED ORDER — METOCLOPRAMIDE HCL 10 MG PO TABS
10.0000 mg | ORAL_TABLET | Freq: Once | ORAL | Status: AC
  Administered 2022-05-16: 10 mg via ORAL
  Filled 2022-05-16: qty 1

## 2022-05-16 MED ORDER — METOCLOPRAMIDE HCL 10 MG PO TABS: 10.0000 mg | ORAL_TABLET | Freq: Four times a day (QID) | ORAL | 0 refills | Status: DC | PRN

## 2022-05-16 MED ORDER — ALUM & MAG HYDROXIDE-SIMETH 200-200-20 MG/5ML PO SUSP
30.0000 mL | Freq: Once | ORAL | Status: AC
  Administered 2022-05-16: 30 mL via ORAL
  Filled 2022-05-16: qty 30

## 2022-05-16 MED ORDER — ALUMINUM-MAGNESIUM-SIMETHICONE 200-200-20 MG/5ML PO SUSP: 30.0000 mL | Freq: Three times a day (TID) | ORAL | 0 refills | Status: DC

## 2022-05-16 MED ORDER — FAMOTIDINE 20 MG PO TABS
40.0000 mg | ORAL_TABLET | Freq: Once | ORAL | Status: AC
  Administered 2022-05-16: 40 mg via ORAL
  Filled 2022-05-16: qty 2

## 2022-05-16 NOTE — Discharge Instructions (Signed)
Your tests today are all okay.  Try taking medicines to lower stomach acid and soothe your stomach for the next week to see if this improves your symptoms.  Please follow-up with your doctor for further evaluation.

## 2022-05-16 NOTE — ED Notes (Signed)
Dr. Scotty Court at bedside for evaluation.

## 2022-05-16 NOTE — ED Provider Notes (Signed)
[facility] Provider Note    Event Date/Time   First MD Initiated Contact with Patient 05/16/22 1845     (approximate)   History   Chief Complaint: Chest Pain   HPI  Craig Harvey is a 66 y.o. male with a history of hypertension, morbid obesity, diabetes, CAD who comes ED complaining of centralized chest pain described as tightness that started at 3:30 PM.  No shortness of breath diaphoresis vomiting or radiation.  No recent exertional symptoms.  No orthopnea or lower extremity edema.  Worse with movement and pressure on the chest wall.  Reports eating fine.  No vomiting or fever.  Reports a history of GERD but not currently on any antacids.     Physical Exam   Triage Vital Signs: ED Triage Vitals  Enc Vitals Group     BP 05/16/22 1840 (!) 161/96     Pulse Rate 05/16/22 1836 66     Resp 05/16/22 1836 20     Temp 05/16/22 1836 98.2 F (36.8 C)     Temp Source 05/16/22 1836 Oral     SpO2 05/16/22 1840 97 %     Weight 05/16/22 1837 279 lb (126.6 kg)     Height 05/16/22 1837 5\' 8"  (1.727 m)     Head Circumference --      Peak Flow --      Pain Score 05/16/22 1837 6     Pain Loc --      Pain Edu? --      Excl. in GC? --     Most recent vital signs: Vitals:   05/16/22 1840 05/16/22 1936  BP: (!) 161/96 (!) 157/96  Pulse:  64  Resp:  15  Temp:    SpO2: 97% 100%    General: Awake, no distress.  CV:  Good peripheral perfusion.  Regular rate and rhythm. Resp:  Normal effort.  Clear to auscultation bilaterally Abd:  No distention.  Soft nontender Other:  No lower extremity edema or calf tenderness.  There is tenderness to palpation over the sternum reproducing the patient's discomfort.   ED Results / Procedures / Treatments   Labs (all labs ordered are listed, but only abnormal results are displayed) Labs Reviewed  BASIC METABOLIC PANEL - Abnormal; Notable for the following components:      Result Value   Sodium 132 (*)    Glucose,  Bld 107 (*)    All other components within normal limits  CBC - Abnormal; Notable for the following components:   WBC 3.7 (*)    All other components within normal limits  TROPONIN I (HIGH SENSITIVITY) - Abnormal; Notable for the following components:   Troponin I (High Sensitivity) 42 (*)    All other components within normal limits  HEPATIC FUNCTION PANEL  LIPASE, BLOOD     EKG Interpreted by me Sinus rhythm rate of 68.  Normal axis, normal intervals.  Poor R wave progression.  Normal ST segments and T waves.   RADIOLOGY Chest x-ray interpreted by me, appears normal.  Radiology report reviewed   PROCEDURES:  Procedures   MEDICATIONS ORDERED IN ED: Medications  alum & mag hydroxide-simeth (MAALOX/MYLANTA) 200-200-20 MG/5ML suspension 30 mL (30 mLs Oral Given 05/16/22 1939)  famotidine (PEPCID) tablet 40 mg (40 mg Oral Given 05/16/22 1939)  metoCLOPramide (REGLAN) tablet 10 mg (10 mg Oral Given 05/16/22 1939)     IMPRESSION / MDM / ASSESSMENT AND PLAN / ED COURSE  I reviewed the  triage vital signs and the nursing notes.                              Differential diagnosis includes, but is not limited to, chest wall pain, GERD, non-STEMI, pneumothorax  Patient's presentation is most consistent with acute presentation with potential threat to life or bodily function.  Patient presents with atypical, noncardiac chest pain.  However, with his age and comorbidities, screening was undertaken with labs, EKG, chest x-ray.  This is all unremarkable and reassuring.  Will treat with antacids, follow-up with PCP.   Considering the patient's symptoms, medical history, and physical examination today, I have low suspicion for ACS, PE, TAD, pneumothorax, carditis, mediastinitis, pneumonia, CHF, or sepsis.        FINAL CLINICAL IMPRESSION(S) / ED DIAGNOSES   Final diagnoses:  Atypical chest pain  Type 2 diabetes mellitus without complication, with long-term current use of  insulin (HCC)  Morbid obesity (HCC)     Rx / DC Orders   ED Discharge Orders          Ordered    aluminum-magnesium hydroxide-simethicone (MAALOX) 200-200-20 MG/5ML SUSP  3 times daily before meals & bedtime        05/16/22 1958    famotidine (PEPCID) 20 MG tablet  2 times daily        05/16/22 1958    metoCLOPramide (REGLAN) 10 MG tablet  Every 6 hours PRN        05/16/22 1958             Note:  This document was prepared using Dragon voice recognition software and may include unintentional dictation errors.   Sharman Cheek, MD 05/16/22 2001

## 2022-05-16 NOTE — ED Triage Notes (Addendum)
Pt via EMS from home Pt c/o centralized chest pain starting 03:30pm. Denies SOB/cough but states it feels tight when he takes a deep breath. Non-radiating pain. Pt has a hx of bypass in 2016 and stents placed in July. Pt has a hx of HTN states this chest pain happens when his BP is elevated. Pt was seen and admitted this past week with elevated troponin. EMS gave 324 ASA and 1in of Nitro pain with no changes to his pain. Pt does take clopidogrel. Pt is A&OX4 and NAD

## 2022-05-17 ENCOUNTER — Ambulatory Visit: Payer: Medicaid Other | Admitting: Pharmacist

## 2022-05-17 ENCOUNTER — Ambulatory Visit: Payer: Self-pay | Admitting: *Deleted

## 2022-05-17 DIAGNOSIS — E1151 Type 2 diabetes mellitus with diabetic peripheral angiopathy without gangrene: Secondary | ICD-10-CM

## 2022-05-17 DIAGNOSIS — I10 Essential (primary) hypertension: Secondary | ICD-10-CM

## 2022-05-17 MED ORDER — ACCU-CHEK GUIDE ME W/DEVICE KIT: PACK | 0 refills | Status: AC

## 2022-05-17 MED ORDER — ACCU-CHEK GUIDE VI STRP: ORAL_STRIP | 12 refills | Status: DC

## 2022-05-17 MED ORDER — ACCU-CHEK SOFTCLIX LANCETS MISC: 12 refills | Status: AC

## 2022-05-17 NOTE — Patient Instructions (Signed)
Check your blood pressure once daily, and any time you have concerning symptoms like headache, chest pain, dizziness, shortness of breath, or vision changes.   Our goal is less than 130/80.  To appropriately check your blood pressure, make sure you do the following:  1) Avoid caffeine, exercise, or tobacco products for 30 minutes before checking. Empty your bladder. 2) Sit with your back supported in a flat-backed chair. Rest your arm on something flat (arm of the chair, table, etc). 3) Sit still with your feet flat on the floor, resting, for at least 5 minutes.  4) Check your blood pressure. Take 1-2 readings.  5) Write down these readings and bring with you to any provider appointments.  Bring your home blood pressure machine with you to a provider's office for accuracy comparison at least once a year.   Make sure you take your blood pressure medications before you come to any office visit, even if you were asked to fast for labs.  Theoplis Garciagarcia Shivaan Tierno, PharmD, BCACP, CPP Clinical Pharmacist [facility] [phone]  

## 2022-05-17 NOTE — Telephone Encounter (Signed)
  Chief Complaint: elevated BP 2 hours after taking BP medications.   It's 197/115. Symptoms: Denies any symptoms of headache, blurred vision, dizziness.   Speech and conversation appropriate and clear. Frequency: Now Pertinent Negatives: Patient denies any symptoms.    "I feel fine".    "I just took a walk". Disposition: [] ED /[] Urgent Care (no appt availability in office) / [] Appointment(In office/virtual)/ []  [place] [facility]/ [] Home Care/ [] Refused Recommended Disposition /[] [place] [facility]/ [x]  Follow-up with PCP Additional Notes: I sent this information to Dr. high priority since pt was just in the ED yesterday.

## 2022-05-17 NOTE — Telephone Encounter (Signed)
Message from Craig Harvey sent at 05/17/2022 10:16 AM EST  Summary: is Bp appt needed   Pts BP was 204/110 pt went to ER / pt want to know if Dr. K wants him to come back in / pt was seen by Dr. K last week and prescribed a new BP med then/ or does he wait til after taking new BP meds for a while / please advise          Call History   Type Contact Phone/Fax User  05/17/2022 10:15 AM EST Phone (Incoming) Harvey, Craig L (Self) [phone] (M) Harvey, Craig E   Reason for Disposition  Systolic BP  >= 180 OR Diastolic >= 110  Answer Assessment - Initial Assessment Questions 1. BLOOD PRESSURE: "What is the blood pressure?" "Did you take at least two measurements 5 minutes apart?"     Not checked it this morning.  I asked him to check it while I waited.  197/115 now.   Denies any symptoms:  Headaches, dizziness, blurred vision.  I'm feeling good and ok this morning.  I just took a walk.    2. ONSET: "When did you take your blood pressure?"     Not yet this morning 3. HOW: "How did you take your blood pressure?" (e.g., automatic home BP monitor, visiting nurse)     Home BP kit 4. HISTORY: "Do you have a history of high blood pressure?"     Yes 5. MEDICINES: "Are you taking any medicines for blood pressure?" "Have you missed any doses recently?"     Yes  started on Valsartan 160 mg per his chart by Dr. Karamalegos on 05/14/2022.  Also on metoprolol XL 25 mg. Pt went to the ED yesterday 05/16/2022 due to chest pain. Calling in to see what Dr. Karamalegos wants him to do next.   Do I need to come back in?  6. OTHER SYMPTOMS: "Do you have any symptoms?" (e.g., blurred vision, chest pain, difficulty breathing, headache, weakness)     No.   I feel fine today.    I just took a walk.  He checked his BP for me 197/115 now.   He took both of his BP medications at 9:00 this morning.   (It's 10:45 am now at the time of this reading).    7. PREGNANCY: "Is there any chance you are pregnant?"  "When was your last menstrual period?"     N/A  Protocols used: Blood Pressure - High-A-AH  

## 2022-05-17 NOTE — Addendum Note (Signed)
Addended by: Smitty Cords on: 05/17/2022 12:28 PM   Modules accepted: Orders

## 2022-05-17 NOTE — Chronic Care Management (AMB) (Signed)
Chronic Care Management   Outreach Note  05/17/2022 Name: Craig Harvey MRN: [id number] DOB: Nov 14, 1955  I connected with Craig Harvey on 05/17/22 by telephone outreach and verified that I am speaking with the correct person using two identifiers.  Patient appearing on report for [place] Metric Hypertension Control due to last documented ambulatory blood pressure of 154/80 on 05/14/2022. Next appointment with PCP is 07/26/2022.  Patient is participating in a Managed Medicaid Plan:  Yes  From review of chart, note patient seen in [facility] ED yesterday related to chest pain. BP in ED 161/96, HR 66. Discharging provider advised patient to start: - aluminum-magnesium hydroxide-simethicone (MAALOX) 200-200-20 MG/5ML SUSP  3 times daily before meals & bedtime  - famotidine (PEPCID) 20 MG tablet  2 times daily  - metoCLOPramide (REGLAN) 10 MG tablet  Every 6 hours PRN      Note patient seen for Office Visit with PCP on 05/14/2022. Provider advised patient: - STOP lisinopril 10 mg twice daily; START valsartan 160 mg daily - Continue metoprolol ER 25 mg daily - Keep appt with Cardiologist/ECHO (scheduled for 06/03/2022)  Outreached patient to discuss hypertension control and medication management.   Outpatient Encounter Medications as of 05/17/2022  Medication Sig   aspirin EC 81 MG tablet Take 1 tablet (81 mg total) by mouth daily.   atorvastatin (LIPITOR) 80 MG tablet Take 1 tablet (80 mg total) by mouth daily.   baclofen (LIORESAL) 10 MG tablet TAKE ONE-HALF TO ONE TABLET BY MOUTH TWICE DAILY AS NEEDED FOR MUSCLE SPASM (LEG PAIN)   clopidogrel (PLAVIX) 75 MG tablet Take 1 tablet (75 mg total) by mouth daily.   ezetimibe (ZETIA) 10 MG tablet Take 1 tablet (10 mg total) by mouth daily.   insulin lispro protamine-lispro (HUMALOG MIX 75/25) (75-25) 100 UNIT/ML SUSP injection INJECT 17 UNITS SUBCUTANEOUSLY TWICE DAILY WITH A MEAL. MAX OF 50 UNITS DAILY.   isosorbide mononitrate (IMDUR) 60  MG 24 hr tablet Take 1 tablet (60 mg total) by mouth daily.   metFORMIN (GLUCOPHAGE) 1000 MG tablet TAKE 1 TABLET BY MOUTH TWICE DAILY WITH A MEAL   metoprolol succinate (TOPROL-XL) 25 MG 24 hr tablet Take 1 tablet (25 mg total) by mouth daily.   valsartan (DIOVAN) 160 MG tablet Take 1 tablet (160 mg total) by mouth daily.   aluminum-magnesium hydroxide-simethicone (MAALOX) 200-200-20 MG/5ML SUSP Take 30 mLs by mouth 4 (four) times daily -  before meals and at bedtime.   BD VEO INSULIN SYRINGE U/F 31G X 15/64" 0.3 ML MISC USE AS DIRECTED TWICE DAILY   famotidine (PEPCID) 20 MG tablet Take 1 tablet (20 mg total) by mouth 2 (two) times daily.   glucose blood (ACCU-CHEK AVIVA PLUS) test strip Use to check blood sugar up to 3 x daily   metoCLOPramide (REGLAN) 10 MG tablet Take 1 tablet (10 mg total) by mouth every 6 (six) hours as needed.   nitroGLYCERIN (NITROSTAT) 0.4 MG SL tablet Place 1 tablet (0.4 mg total) under the tongue every 5 (five) minutes as needed for chest pain.   Syringe, Disposable, 3 ML MISC Use to inject humalog mix 75/25 17u BID   No facility-administered encounter medications on file as of 05/17/2022.    Lab Results  Component Value Date   CREATININE 0.84 05/16/2022   BUN 11 05/16/2022   NA 132 (L) 05/16/2022   K 3.7 05/16/2022   CL 101 05/16/2022   CO2 22 05/16/2022    BP Readings from Last 3  Encounters:  05/16/22 (!) 159/88  05/14/22 (!) 154/80  05/12/22 131/82    Pulse Readings from Last 3 Encounters:  05/16/22 68  05/14/22 85  05/12/22 62   Reports has not yet picked up Maalox, famotidine and metoclopramide Rxs as prescribed yesterday at hospital discharge, but denies any chest pain today. Reports he is feeling good -Plans to pick up and start above medications as recommended at ED discharge related to stomach acid  Hypertension:  Patient followed by [facility]   Current medications:  - metoprolol ER 25 mg daily - valsartan 160 mg daily    Reports started yesterday, 11/12  Confirms stopped lisinopril with start of valsartan - isosorbide ER 60 mg daily  Previous therapies tried: lisinopril, HCTZ (discontinued 12/2021 related to hyponatremia); amlodipine  Reports uses weekly pillbox to organize his medications; denies missed doses  Home Monitoring: Patient has an automated upper arm home BP machine Counsel on BP monitoring technique Current blood pressure readings:  - This morning: 197/110  Denies hypertensive symptoms including headache, chest pain, shortness of breath, vision changes, change in speech, numbness/weakness  Counsel patient on blood pressure monitoring technique - patient admits reading this morning taken immediately after returning from walking. Reports drank ~ 6 oz of Coke Zero this morning  Give patient time to rest, recheck blood pressure and call back:  - Afternoon reading: 146/90, HR 88  Current physical activity: walks ~10-15 minutes/day (reports duration limited by leg pain) x 7 days/week  OSA/CPAP: Reports unable to use CPAP consistently as finds it difficult to sleep on his back  - Reports often remains awake/unable to sleep when wears the CPAP and eventually removes it - Reports CPAP machine >67 years old; no longer in touch with DME - orders supplies from [place]  - Patient interested in trying a mask that would allow for him to sleep on his side  Caffeine: Denies drinking coffee, but admits to drinking Dr. Marisue Ivan and Coke Zero (~3 x 12 oz/day)  Current dietary patterns: breakfast: sliced [place] sandwich on wheat with mayo; lunch: BBQ chicken with spinach; supper: baked chicken or fish with mashed potatoes and green beans; drink: water and Zero Dr. Pepper/Coke/Sprite (3 x 12 oz) - Reports tries to follow fluid restriction as reports was recommended during a previous hospital discharge; currently drinking ~ 6 x 12 oz cups of water/day  Denies adding salt to his food  Type 2  Diabetes: Current medications: - Humalog Mix 75/25 - Injects 17 units twice daily with meals - metformin 1000 mg twice daily  Current blood sugar readings:  - Today morning fasting: 85  - Recalls recent morning fasting ranging: 85-120  Patient requests Rx for new Accu Chek Aviva Plus meter  Denies symptoms of hypoglycemia   Assessment/Plan:  Collaborated with PCP to send Rx for new glucometer and testing supplies to pharmacy for patient  Hypertension: - Currently uncontrolled - Reviewed goal blood pressure <130/80 - Reviewed appropriate home BP monitoring technique (avoid caffeine, smoking, and exercise for 30 minutes before checking, rest for at least 5 minutes before taking BP, sit with feet flat on the floor and back against a hard surface, uncross legs, and rest arm on flat surface) - Discussed dietary modifications, such as reduced salt intake, focus on whole grains, vegetables, lean proteins and reducing caffeine consumption - Collaborate with PCP regarding patient's reported blood pressure readings today and medication management. Given patient started latest blood pressure regimen adjustment yesterday, agree to have patient monitor home  blood pressure, document and provide to CM Pharmacist during telephone appointment in 2 weeks.  Patient advised to contact PCP or Cardiology offices sooner if blood pressure readings consistently >140/90 or new/worsening symptoms.  Review importance of seeking Urgent Medical Care if chest pain, shortness of breath, back pain, numbness/weakness, change in vision, or difficulty speaking with reading ?180/120 and to follow return precautions as given by ED - Counsel on importance of using CPAP consistently. Encourage patient to call office to schedule appointment with PCP regarding OSA/new CPAP   Follow Up Plan: CM Pharmacist will outreach to patient by telephone again on 05/31/2022 at 9:15 am  Estelle Grumbles, PharmD, [facility] Clinical  Pharmacist [facility] [phone]

## 2022-05-17 NOTE — Telephone Encounter (Signed)
Patient will need some medication adjustment. Given frequent visits and ED visits, and uncontrolled readings, despite some medication adjustment already. I will consult our clinical pharmacist and route this to Estelle Grumbles [facility] CPP for further advice to help me with BP agent selection and review his home med adherence, BP monitoring strategy, diet component as well to make sure we are not missing anything.  She is going to review the chart and call patient today.  Will stay tuned for updates  Saralyn Pilar, DO [facility] 05/17/2022, 12:28 PM

## 2022-05-18 ENCOUNTER — Telehealth: Payer: Self-pay

## 2022-05-18 NOTE — Patient Outreach (Signed)
Transition Care Management Unsuccessful Follow-up Telephone Call  Date of discharge and from where:  05/16/22 [facility]  Attempts:  1st Attempt  Reason for unsuccessful TCM follow-up call:  Left voice message   Gus Puma, BSW, [facility]  High Risk Managed Medicaid Team  [phone]

## 2022-05-31 ENCOUNTER — Ambulatory Visit: Payer: Medicaid Other | Admitting: Pharmacist

## 2022-05-31 DIAGNOSIS — I10 Essential (primary) hypertension: Secondary | ICD-10-CM

## 2022-05-31 NOTE — Chronic Care Management (AMB) (Signed)
Chronic Care Management   Outreach Note  05/31/2022 Name: Craig Harvey MRN: [id number] DOB: 02-Dec-1955  I connected with Craig Harvey on 05/31/22 by telephone outreach and verified that I am speaking with the correct person using two identifiers.  Patient appearing on report for [place] Metric Hypertension Control due to last documented ambulatory blood pressure of 154/80 on 05/14/2022. Next appointment with PCP is 07/26/2022.   Note patient scheduled for appointment with Echocardiogram on 11/30 and with Cardiologist on 06/11/2022  Outreached patient to discuss hypertension control and medication management.   Outpatient Encounter Medications as of 05/31/2022  Medication Sig   famotidine (PEPCID) 20 MG tablet Take 1 tablet (20 mg total) by mouth 2 (two) times daily.   isosorbide mononitrate (IMDUR) 60 MG 24 hr tablet Take 1 tablet (60 mg total) by mouth daily.   metoprolol succinate (TOPROL-XL) 25 MG 24 hr tablet Take 1 tablet (25 mg total) by mouth daily.   valsartan (DIOVAN) 160 MG tablet Take 1 tablet (160 mg total) by mouth daily.   Accu-Chek Softclix Lancets lancets Use to check blood sugar up to 3 x daily   aluminum-magnesium hydroxide-simethicone (MAALOX) 200-200-20 MG/5ML SUSP Take 30 mLs by mouth 4 (four) times daily -  before meals and at bedtime.   aspirin EC 81 MG tablet Take 1 tablet (81 mg total) by mouth daily.   atorvastatin (LIPITOR) 80 MG tablet Take 1 tablet (80 mg total) by mouth daily.   baclofen (LIORESAL) 10 MG tablet TAKE ONE-HALF TO ONE TABLET BY MOUTH TWICE DAILY AS NEEDED FOR MUSCLE SPASM (LEG PAIN)   BD VEO INSULIN SYRINGE U/F 31G X 15/64" 0.3 ML MISC USE AS DIRECTED TWICE DAILY   Blood Glucose Monitoring Suppl (ACCU-CHEK GUIDE ME) w/Device KIT Use to check blood sugar up to 3 x daily   clopidogrel (PLAVIX) 75 MG tablet Take 1 tablet (75 mg total) by mouth daily.   ezetimibe (ZETIA) 10 MG tablet Take 1 tablet (10 mg total) by mouth daily.   glucose  blood (ACCU-CHEK GUIDE) test strip Use to check blood sugar up to 3 x daily   insulin lispro protamine-lispro (HUMALOG MIX 75/25) (75-25) 100 UNIT/ML SUSP injection INJECT 17 UNITS SUBCUTANEOUSLY TWICE DAILY WITH A MEAL. MAX OF 50 UNITS DAILY.   metFORMIN (GLUCOPHAGE) 1000 MG tablet TAKE 1 TABLET BY MOUTH TWICE DAILY WITH A MEAL   metoCLOPramide (REGLAN) 10 MG tablet Take 1 tablet (10 mg total) by mouth every 6 (six) hours as needed.   nitroGLYCERIN (NITROSTAT) 0.4 MG SL tablet Place 1 tablet (0.4 mg total) under the tongue every 5 (five) minutes as needed for chest pain.   Syringe, Disposable, 3 ML MISC Use to inject humalog mix 75/25 17u BID   No facility-administered encounter medications on file as of 05/31/2022.    Lab Results  Component Value Date   CREATININE 0.84 05/16/2022   BUN 11 05/16/2022   NA 132 (L) 05/16/2022   K 3.7 05/16/2022   CL 101 05/16/2022   CO2 22 05/16/2022    BP Readings from Last 3 Encounters:  05/16/22 (!) 159/88  05/14/22 (!) 154/80  05/12/22 131/82    Pulse Readings from Last 3 Encounters:  05/16/22 68  05/14/22 85  05/12/22 62    Reports stomach symptoms improved since started taking Maalox, famotidine and metoclopramide Rxs as prescribed from ED provider on 11/12.  Hypertension:   Patient followed by [facility] Cardiology    Current medications:  - metoprolol ER  25 mg daily - valsartan 160 mg daily  - isosorbide ER 60 mg daily   Previous therapies tried: lisinopril, HCTZ (discontinued 12/2021 related to hyponatremia); amlodipine   Reports uses weekly pillbox to organize his medications; denies missed doses   Home Monitoring: Patient has an automated upper arm home BP machine Counsel on BP monitoring technique Current blood pressure readings:   AM BP PM BP Notes  19 - November 117/68, HR 65    20 - November     21 - November 119/77, HR 80 140/81, HR 77   22 - November 145/87, HR 66 153/86, HR 66   23 - November - -   24 - November  153/86, HR 68 140/90, HR 78   25 - November 152/92, HR 87 187/108, HR 79* *Denies symptoms with reading  26 - November 140/86, HR 69 165/94, HR 77   27 - November 156/90, HR 67     Denies hypertensive symptoms including chest pain, shortness of breath, vision changes, change in speech, numbness/weakness. Reports occasionally has a mild headache  Per patient and provider, patient with history of labile blood pressure   Reports improved blood pressure monitoring technique   Current physical activity: walks ~10-15 minutes/day (reports duration limited by leg pain) x 7 days/week   OSA/CPAP: Reports unable to use CPAP consistently as finds it difficult to sleep on his back             - Reports often remains awake/unable to sleep when wears the CPAP and eventually removes it - Reports CPAP machine >36 years old; no longer in touch with DME - [place] supplies from [place]             - Patient interested in trying a mask that would allow for him to sleep on his side  - Reports contacted health plan and was advised [employer] is a preferred DME supplier   Will let PCP know   Caffeine: Reports has reduced caffeine consumption to 1-2 Zero colas/day   Reports had more salt/sodium last week around the holiday, but typically limits sodium/does not add salt to his food  Notes sometimes elevated readings related to stress/when feeling angry   Type 2 Diabetes: Current medications: - Humalog Mix 75/25 - Injects 17 units twice daily with meals - metformin 1000 mg twice daily   Current blood sugar readings:  - Today morning fasting: 93 - Recalls recent morning fasting ranging: 90-110   Denies hypoglycemia    Hypertension: - Currently uncontrolled - Reviewed goal blood pressure <130/80 - Reviewed appropriate home BP monitoring technique (avoid caffeine, smoking, and exercise for 30 minutes before checking, rest for at least 5 minutes before taking BP, sit with feet flat on the floor and  back against a hard surface, uncross legs, and rest arm on flat surface) - Discussed dietary modifications, such as reduced salt intake - Will collaborate with PCP regarding patient's reported blood pressure readings today  - Patient advised to contact PCP or Cardiology offices sooner if blood pressure readings consistently >140/90 or new/worsening symptoms.  - Review importance of seeking [facility] if chest pain, shortness of breath, back pain, numbness/weakness, change in vision, or difficulty speaking with reading ?180/120 and to follow return precautions as given by ED - Encourage patient to call office to schedule appointment with PCP regarding OSA/new CPAP and questions about muscle pain   Follow Up Plan: CM Pharmacist will outreach to patient by telephone again on 07/02/2022 at  9:15 am  Wallace Cullens, PharmD, [place] [phone]

## 2022-05-31 NOTE — Patient Instructions (Signed)
Check your blood pressure once daily, and any time you have concerning symptoms like headache, chest pain, dizziness, shortness of breath, or vision changes.   Our goal is less than 130/80.  To appropriately check your blood pressure, make sure you do the following:  1) Avoid caffeine, exercise, or tobacco products for 30 minutes before checking. Empty your bladder. 2) Sit with your back supported in a flat-backed chair. Rest your arm on something flat (arm of the chair, table, etc). 3) Sit still with your feet flat on the floor, resting, for at least 5 minutes.  4) Check your blood pressure. Take 1-2 readings.  5) Write down these readings and bring with you to any provider appointments.  Bring your home blood pressure machine with you to a provider's office for accuracy comparison at least once a year.   Make sure you take your blood pressure medications before you come to any office visit, even if you were asked to fast for labs.  Yogi Arther Ayshia Gramlich, PharmD, BCACP, CPP Clinical Pharmacist [facility] [phone]  

## 2022-06-03 ENCOUNTER — Ambulatory Visit: Payer: Medicaid Other

## 2022-06-07 ENCOUNTER — Ambulatory Visit (INDEPENDENT_AMBULATORY_CARE_PROVIDER_SITE_OTHER): Payer: Medicaid Other | Admitting: Family Medicine

## 2022-06-07 ENCOUNTER — Encounter: Payer: Self-pay | Admitting: Family Medicine

## 2022-06-07 VITALS — BP 138/94 | HR 78 | Ht 68.0 in | Wt 274.0 lb

## 2022-06-07 DIAGNOSIS — G4733 Obstructive sleep apnea (adult) (pediatric): Secondary | ICD-10-CM

## 2022-06-07 DIAGNOSIS — I10 Essential (primary) hypertension: Secondary | ICD-10-CM

## 2022-06-07 DIAGNOSIS — K219 Gastro-esophageal reflux disease without esophagitis: Secondary | ICD-10-CM | POA: Diagnosis not present

## 2022-06-07 MED ORDER — METOCLOPRAMIDE HCL 10 MG PO TABS: 10.0000 mg | ORAL_TABLET | Freq: Three times a day (TID) | ORAL | 0 refills | Status: DC | PRN

## 2022-06-07 NOTE — Progress Notes (Signed)
Subjective:    Patient ID: Craig Harvey, male    DOB: July 29, 1955, 66 y.o.   MRN: [id number]  Craig Harvey is a 66 y.o. male presenting on 06/07/2022 for Hypertension   HPI  Hypertension History of Unstable angina s/p PCI stent Syncopal episode CAD / Vascular Disease / S/p CABG  ECHOCardiogram on 11/30 and Cardiology outpatient f/u after   He has risk factors with DM2, HYPERTENSION, HLD, OSA on CPAP   He has had some labile BP and elevation in BP cause his symptoms it seems.    Followed by [facility] Dr Mariah Milling. history of CABG 01/2015   He continues on DAPT with Plavix 75 and ASA 81   He is on Lisinopril 10mg  TWICE A DAY, Metoprolol XL 25mg  daily Also on Imdur 60mg    He is off HCTZ and K  Recent ED visit given Reglan for GERD nausea, and sedation aspect helps him rest  Last week home BP readings Working with [facility] 152/92, 170/100, Tues 128/69, 139/80, weds 161/102, 132/82, thurs 140/82, 168/101, and 139/80, 134/73, 140/81, 144/80, 130/73, 115/68, 157/100.  Calibrated his machine to ours  OSA on CPAP Last PSG sleep study >18 years ago, his machine is about that old and not functioning well, he does not use it often at this time. Needs new orders through for DME   Epworth Sleepiness Scale Total Score: 13 Sitting and reading - 3 Watching TV - 3 Sitting inactive in a public place - 2 As a passenger in a car for an hour without a break - 3 Lying down to rest in the afternoon when circumstances permit - 2 Sitting and talking to someone - 0 Sitting quietly after a lunch without alcohol - 0 In a car, while stopped for a few minutes in traffic - 0                   STOP-Bang OSA scoring Snoring yes   Tiredness yes   Observed apneas yes   Pressure HTN yes   BMI > 35 kg/m2 yes   Age > 50  yes   Neck (male >17 in; Male >16 in)  yes >17 inch  Gender male yes   OSA risk low (0-2)  OSA risk intermediate (3-4)  OSA risk high (5+)   Total: 8 high risk               06/07/2022    2:30 PM 05/14/2022    2:22 PM 12/22/2021    2:09 PM  Depression screen PHQ 2/9  Decreased Interest 0 0 0  Down, Depressed, Hopeless 0 1 0  PHQ - 2 Score 0 1 0  Altered sleeping 0 0 0  Tired, decreased energy 0 1 0  Change in appetite 0 0 0  Feeling bad or failure about yourself  0 0 0  Trouble concentrating 0 0 0  Moving slowly or fidgety/restless 0 0 0  Suicidal thoughts 0 0 0  PHQ-9 Score 0 2 0  Difficult doing work/chores Not difficult at all Not difficult at all Not difficult at all    Social History   Tobacco Use   Smoking status: Former    Types: Cigarettes    Quit date: 08/06/1975    Years since quitting: 46.8   Smokeless tobacco: Never  Vaping Use   Vaping Use: Never used  Substance Use Topics   Alcohol use: No   Drug use: No    Review  of Systems Per HPI unless specifically indicated above     Objective:    BP (!) 138/94 (BP Location: Left Arm, Cuff Size: Normal)   Pulse 78   Ht 5\' 8"  (1.727 m)   Wt 274 lb (124.3 kg)   SpO2 98%   BMI 41.66 kg/m   Wt Readings from Last 3 Encounters:  06/07/22 274 lb (124.3 kg)  05/16/22 279 lb (126.6 kg)  05/14/22 279 lb (126.6 kg)    Physical Exam Vitals and nursing note reviewed.  Constitutional:      General: He is not in acute distress.    Appearance: He is well-developed. He is obese. He is not diaphoretic.     Comments: Well-appearing, comfortable, cooperative  HENT:     Head: Normocephalic and atraumatic.  Eyes:     General:        Right eye: No discharge.        Left eye: No discharge.     Conjunctiva/sclera: Conjunctivae normal.  Neck:     Thyroid: No thyromegaly.  Cardiovascular:     Rate and Rhythm: Normal rate and regular rhythm.     Pulses: Normal pulses.     Heart sounds: Normal heart sounds. No murmur heard. Pulmonary:     Effort: Pulmonary effort is normal. No respiratory distress.     Breath sounds: Normal breath sounds. No wheezing or  rales.  Musculoskeletal:        General: Normal range of motion.     Cervical back: Normal range of motion and neck supple.  Lymphadenopathy:     Cervical: No cervical adenopathy.  Skin:    General: Skin is warm and dry.     Findings: No erythema or rash.  Neurological:     Mental Status: He is alert and oriented to person, place, and time. Mental status is at baseline.  Psychiatric:        Behavior: Behavior normal.     Comments: Well groomed, good eye contact, normal speech and thoughts      Results for orders placed or performed during the hospital encounter of 0000000  Basic metabolic panel  Result Value Ref Range   Sodium 132 (L) 135 - 145 mmol/L   Potassium 3.7 3.5 - 5.1 mmol/L   Chloride 101 98 - 111 mmol/L   CO2 22 22 - 32 mmol/L   Glucose, Bld 107 (H) 70 - 99 mg/dL   BUN 11 8 - 23 mg/dL   Creatinine, Ser 0.84 0.61 - 1.24 mg/dL   Calcium 9.0 8.9 - 10.3 mg/dL   GFR, Estimated >60 >60 mL/min   Anion gap 9 5 - 15  CBC  Result Value Ref Range   WBC 3.7 (L) 4.0 - 10.5 K/uL   RBC 4.69 4.22 - 5.81 MIL/uL   Hemoglobin 13.1 13.0 - 17.0 g/dL   HCT 39.5 39.0 - 52.0 %   MCV 84.2 80.0 - 100.0 fL   MCH 27.9 26.0 - 34.0 pg   MCHC 33.2 30.0 - 36.0 g/dL   RDW 13.2 11.5 - 15.5 %   Platelets 181 150 - 400 K/uL   nRBC 0.0 0.0 - 0.2 %  Hepatic function panel  Result Value Ref Range   Total Protein 7.1 6.5 - 8.1 g/dL   Albumin 3.9 3.5 - 5.0 g/dL   AST 26 15 - 41 U/L   ALT 24 0 - 44 U/L   Alkaline Phosphatase 52 38 - 126 U/L   Total Bilirubin 0.6 0.3 -  1.2 mg/dL   Bilirubin, Direct 0.2 0.0 - 0.2 mg/dL   Indirect Bilirubin 0.4 0.3 - 0.9 mg/dL  Lipase, blood  Result Value Ref Range   Lipase 37 11 - 51 U/L  Troponin I (High Sensitivity)  Result Value Ref Range   Troponin I (High Sensitivity) 42 (H) <18 ng/L      Assessment & Plan:   Problem List Items Addressed This Visit     Essential hypertension   Relevant Medications   valsartan (DIOVAN) 160 MG tablet   OSA on  CPAP - Primary   Other Visit Diagnoses     Gastroesophageal reflux disease without esophagitis       Relevant Medications   metoCLOPramide (REGLAN) 10 MG tablet        Refilled Reglan, likely it is the sedation that is helping you, not an ideal long term option if you need more we can find an alternative.   HYPERTENSION Improved control but still elevated, has sporadic readings, some days improved in range other days out of range, seems about 50/50  BP improved but still episodic  Double the Valsartan 160mg  tab x 2 = 320mg  once daily in morning for BP  Reviewed readings, goal is to have all readings or most days at least 5+ days a week with BP controlled < 140 / 90  OSA on CPAP - inadequately controlled on old CPAP needs updating, and not using currently Likely major factor for raising his BP  - Screening: ESS score 13 / STOP-Bang Score 8 high risk - Neck Circumference: >17" - Co-morbidities: HYPERTENSION, obesity  Plan: Agree to proceed with sleep study testing based on clinical concerns for repeat CPAP Titration new orders needed for CPAP - will fax referral to [place]   We can send orders or they can send orders to [facility] for the CPAP machine.  Meds ordered this encounter  Medications   metoCLOPramide (REGLAN) 10 MG tablet    Sig: Take 1 tablet (10 mg total) by mouth every 8 (eight) hours as needed for nausea (heart burn).    Dispense:  30 tablet    Refill:  0      Follow up plan: Return if symptoms worsen or fail to improve, for keep upcoming apt.  [name], [facility] 06/07/2022, 2:39 PM

## 2022-06-07 NOTE — Patient Instructions (Addendum)
Thank you for coming to the office today.  Refilled Reglan, likely it is the sedation that is helping you, not an ideal long term option if you need more we can find an alternative.  BP improved but still episodic  Double the Valsartan 160mg  tab x 2 = 320mg  once daily in morning for BP  Reviewed readings, goal is to have all readings or most days at least 5+ days a week with BP controlled < 140 / 90  [facility] Address: [address] [place], [place], [place] Phone: [phone]  Referral sent via fax today. Stay tuned to hear back from them, they should call you to schedule initial sleep study, likely a home test and then if abnormal - they will arrange the 2nd part of the sleep study in the lab with the CPAP machine.  If there is any issue with this company, for example not covered by insurance or other problem, please notify [phone] and they may do so a well - we will need to change the location of the referral.  We can send orders or they can send orders to [facility] for the CPAP machine.   Please schedule a Follow-up Appointment to: Return if symptoms worsen or fail to improve, for keep upcoming apt.  If you have any other questions or concerns, please feel free to call the office or send a message through MyChart. You may also schedule an earlier appointment if necessary.  Additionally, you may be receiving a survey about your experience at our office within a few days to 1 week by e-mail or mail. We value your feedback.  [place], DO [facility], Saralyn Pilar

## 2022-06-11 ENCOUNTER — Encounter: Payer: Self-pay | Admitting: Cardiology

## 2022-06-11 ENCOUNTER — Ambulatory Visit: Payer: Medicaid Other | Attending: Cardiology | Admitting: Cardiology

## 2022-06-11 VITALS — BP 130/82 | HR 78 | Ht 68.0 in | Wt 282.0 lb

## 2022-06-11 DIAGNOSIS — E785 Hyperlipidemia, unspecified: Secondary | ICD-10-CM | POA: Diagnosis not present

## 2022-06-11 DIAGNOSIS — I10 Essential (primary) hypertension: Secondary | ICD-10-CM

## 2022-06-11 DIAGNOSIS — I2510 Atherosclerotic heart disease of native coronary artery without angina pectoris: Secondary | ICD-10-CM

## 2022-06-11 DIAGNOSIS — E1159 Type 2 diabetes mellitus with other circulatory complications: Secondary | ICD-10-CM

## 2022-06-11 NOTE — Progress Notes (Signed)
Cardiology Clinic Note   Patient Name: Craig Harvey Date of Encounter: 06/11/2022  Primary Care Provider:  [name], DO Primary Cardiologist:  Ida Rogue, MD  Patient Profile    66 year old male with a history of CAD status post three-vessel CABG in 01/2015 status post PCI/DES to the native proximal RCA on 12/10/2021 with postoperative atrial fibrillation, hypertension, hyperlipidemia, insulin-dependent diabetes, obesity, and sleep apnea who presents today for follow-up.  Past Medical History    Past Medical History:  Diagnosis Date   CAD (coronary artery disease)    a. 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 cardiac cath 01/09/2015: ostLAD 70:, mLAD 99%, ost to pLCx 95%, mLCx 80%, LVEF nl, recommend CABGl; c. s/p 3v CABG 01/14/15 LIMA-LAD, SVG-OM, SVG-Ramus   History of echocardiogram    a. 01/2015 Echo: EF 60-65%, no rwma, nl RV fxn. Nl PASP.   Hyperlipidemia LDL goal <70    Hypertension    Morbid obesity (HCC)    OSA (obstructive sleep apnea)    Type II diabetes mellitus ([place])    Past Surgical History:  Procedure Laterality Date   CARDIAC CATHETERIZATION N/A 01/09/2015   Procedure: Left Heart Cath and Coronary Angiography;  Surgeon: Minna Merritts, MD;  Location: [facility];  Service: Cardiovascular;  Laterality: N/A;   CORONARY ARTERY BYPASS GRAFT N/A 01/14/2015   Procedure: CORONARY ARTERY BYPASS GRAFTING (CABG), ON PUMP, TIMES THREE, USING LEFT INTERNAL MAMMARY ARTERY, RIGHT GREATER SAPHENOUS VEIN HARVESTED ENDOSCOPICALLY;  Surgeon: Ivin Poot, MD;  Location: [place];  Service: Open Heart Surgery;  Laterality: N/A;   CORONARY STENT INTERVENTION N/A 12/10/2021   Procedure: CORONARY STENT INTERVENTION;  Surgeon: Leonie Man, MD;  Location: [facility];  Service: Cardiovascular;  Laterality: N/A;   HERNIA REPAIR     LEFT HEART CATH AND CORONARY ANGIOGRAPHY N/A 12/10/2021   Procedure: LEFT HEART CATH AND CORONARY ANGIOGRAPHY;  Surgeon: Leonie Man, MD;  Location: [facility];  Service: Cardiovascular;  Laterality: N/A;   TEE WITHOUT CARDIOVERSION N/A 01/14/2015   Procedure: TRANSESOPHAGEAL ECHOCARDIOGRAM (TEE);  Surgeon: Ivin Poot, MD;  Location: [facility];  Service: Open Heart Surgery;  Laterality: N/A;    Allergies  Allergies  Allergen Reactions   Trulicity [Dulaglutide] Other (See Comments)    Headaches     History of Present Illness    Craig Harvey is a 66 year old male with a previously mentioned past medical history of coronary disease status post three-vessel CABG in 01/2015 status post PCI/DES to the native proximal RCA on 12/10/2021 with postoperative atrial fibrillation, hypertension, hyperlipidemia, diabetes, obesity, and sleep apnea.  He was admitted to the hospital on 01/2015 on 7 1 ruled out for microinfarction.  Echocardiogram revealed EF 60 to 65%, no regional wall motion abnormalities, and no significant valvular abnormalities.  Nuclear stress testing showed a large defect of moderate severity in the mid anterior, mid anteroseptal, mid anterolateral, apical anterior, apical lateral and apex locations.  Study without rest.  He subsequently underwent left heart catheterization which showed severe two-vessel CAD he was transferred to [facility] where at that point he underwent three-vessel CABG with LIMA-> LAD, SVG-> OM, and SVG to ramus.  Postoperative course was complicated by atrial fibrillation that was converted back to sinus  with IV amiodarone.  He was admitted to hospital 12/07/2021 through 12/09/2021 for unstable angina.  High-sensitivity troponin peaked at 32.  Echocardiogram demonstrated an EF of 60 to 65%, no regional wall motion abnormalities, mild LVH, G1 DD, no significant valvular  abnormalities.  Imdur was titrated to 60 mg daily.  He noted improvement in symptoms and outpatient ischemic testing was recommended.  However he returned to the hospital the same day he was discharged with recurrent unstable angina with troponin of 28.  Left heart catheterization demonstrated severe native vessel coronary disease to 3 g patent.  There was an occlusion noted in the saphenous to the ramus.  The culprit lesion was 95% focal proximal RCA stenosis.  He underwent successful balloon angioplasty, DES with 0% residual stenosis.  He was last seen in clinic 04/12/2022 where he continued to have chest tightness and labile blood pressure.  He had previously been in the emergency department with blood pressures greater than 161 systolic at that time.  He was then scheduled for Lexiscan MPI which revealed no evidence of ischemia, as a low risk study.  At that time it was recommended he have a repeat echocardiogram unfortunately he has yet to have.  Since he was last seen in office he has had 2 visits to the emergency department for chest pain and/or shortness of breath that was related to uncontrolled hypertension.  Workup in the emergency department was unrevealing he was subsequently discharged.  He also noted with his labile blood pressures that his primary care provider had changed blood pressure medication to valsartan.  Since that time he states that he has had slightly better control of his blood pressure.  He returns to clinic today stating that he has been doing fairly well.  Blood pressure has been slightly better controlled.  He denies any current chest pain or shortness of breath.  He did have questions about why he needed to have the echocardiogram limits from being discussed today.  He also endorses an occasional dizziness with head in the morning and he wanted to know if it was related to his medications.    Home Medications    Current Outpatient Medications  Medication Sig Dispense Refill    Accu-Chek Softclix Lancets lancets Use to check blood sugar up to 3 x daily 100 each 12   aluminum-magnesium hydroxide-simethicone (MAALOX) 200-200-20 MG/5ML SUSP Take 30 mLs by mouth 4 (four) times daily -  before meals and at bedtime. 355 mL 0   aspirin EC 81 MG tablet Take 1 tablet (81 mg total) by mouth daily. 90 tablet 3   atorvastatin (LIPITOR) 80 MG tablet Take 1 tablet (80 mg total) by mouth daily. 90 tablet 3   baclofen (LIORESAL) 10 MG tablet TAKE ONE-HALF TO ONE TABLET BY MOUTH TWICE DAILY AS NEEDED FOR MUSCLE SPASM (LEG PAIN) 180 tablet 1   BD VEO INSULIN SYRINGE U/F 31G X 15/64" 0.3 ML MISC USE AS DIRECTED TWICE DAILY 200 each 3   Blood Glucose Monitoring Suppl (ACCU-CHEK GUIDE ME) w/Device KIT Use to check blood sugar up to 3 x daily 1 kit 0   clopidogrel (PLAVIX) 75 MG tablet Take 1 tablet (75 mg total) by mouth daily. 90 tablet 3   ezetimibe (ZETIA) 10 MG tablet Take 1 tablet (10 mg total) by mouth daily. 90 tablet 3   famotidine (PEPCID) 20 MG tablet Take 1 tablet (20 mg total) by mouth 2 (two) times daily. 60 tablet 0   glucose blood (ACCU-CHEK GUIDE)  test strip Use to check blood sugar up to 3 x daily 100 each 12   insulin lispro protamine-lispro (HUMALOG MIX 75/25) (75-25) 100 UNIT/ML SUSP injection INJECT 17 UNITS SUBCUTANEOUSLY TWICE DAILY WITH A MEAL. MAX OF 50 UNITS DAILY. 30 mL 3   isosorbide mononitrate (IMDUR) 60 MG 24 hr tablet Take 1 tablet (60 mg total) by mouth daily. 90 tablet 1   metFORMIN (GLUCOPHAGE) 1000 MG tablet TAKE 1 TABLET BY MOUTH TWICE DAILY WITH A MEAL 180 tablet 3   metoCLOPramide (REGLAN) 10 MG tablet Take 1 tablet (10 mg total) by mouth every 8 (eight) hours as needed for nausea (heart burn). 30 tablet 0   metoprolol succinate (TOPROL-XL) 25 MG 24 hr tablet Take 1 tablet (25 mg total) by mouth daily. 90 tablet 1   nitroGLYCERIN (NITROSTAT) 0.4 MG SL tablet Place 1 tablet (0.4 mg total) under the tongue every 5 (five) minutes as needed for chest pain.  25 tablet 3   Syringe, Disposable, 3 ML MISC Use to inject humalog mix 75/25 17u BID 200 each 5   valsartan (DIOVAN) 160 MG tablet Take 2 tablets (320 mg total) by mouth daily. 180 tablet 1   No current facility-administered medications for this visit.     Family History    Family History  Problem Relation Age of Onset   CAD Mother    Throat cancer Father    CAD Brother    Diabetes Neg Hx    Cancer Neg Hx    Prostate cancer Neg Hx    Colon cancer Neg Hx    He indicated that his mother is deceased. He indicated that his father is deceased. He indicated that his brother is alive. He indicated that the status of his neg hx is unknown.  Social History    Social History   Socioeconomic History   Marital status: Single    Spouse name: Not on file   Number of children: Not on file   Years of education: Not on file   Highest education level: Not on file  Occupational History   Occupation: Former Administrator / Patent attorney  Tobacco Use   Smoking status: Former    Types: Cigarettes    Quit date: 08/06/1975    Years since quitting: 46.8   Smokeless tobacco: Never  Vaping Use   Vaping Use: Never used  Substance and Sexual Activity   Alcohol use: No   Drug use: No   Sexual activity: Yes  Other Topics Concern   Not on file  Social History Narrative   Not on file   Social Determinants of Health   Financial Resource Strain: Low Risk  (05/20/2021)   Overall Financial Resource Strain (CARDIA)    Difficulty of Paying Living Expenses: Not hard at all  Food Insecurity: No Food Insecurity (05/20/2021)   Hunger Vital Sign    Worried About Running Out of Food in the Last Year: Never true    [place] in the Last Year: Never true  Transportation Needs: No Transportation Needs (05/20/2021)   PRAPARE - Hydrologist (Medical): No    Lack of Transportation (Non-Medical): No  Physical Activity: Sufficiently Active (05/20/2021)   Exercise Vital  Sign    Days of Exercise per Week: 4 days    Minutes of Exercise per Session: 40 min  Stress: No Stress Concern Present (05/20/2021)   [place]  Feeling of Stress : Not at all  Social Connections: Socially Isolated (05/20/2021)   Social Connection and Isolation Panel [NHANES]    Frequency of Communication with Friends and Family: Three times a week    Frequency of Social Gatherings with Friends and Family: Three times a week    Attends Religious Services: Never    Active Member of Clubs or Organizations: No    Attends Archivist Meetings: Never    Marital Status: Never married  Human resources officer Violence: Not on file     Review of Systems    General:  No chills, fever, night sweats or weight changes.  Cardiovascular:  No chest pain, dyspnea on exertion, edema, orthopnea, palpitations, paroxysmal nocturnal dyspnea. Dermatological: No rash, lesions/masses Respiratory: No cough, dyspnea Urologic: No hematuria, dysuria Abdominal:   No nausea, vomiting, diarrhea, bright red blood per rectum, melena, or hematemesis Neurologic:  No visual changes, wkns, changes in mental status.  Endorses dizziness and lightheadedness All other systems reviewed and are otherwise negative except as noted above.   Physical Exam    VS:  BP 130/82 (BP Location: Left Arm, Patient Position: Sitting, Cuff Size: Large)   Pulse 78   Ht _0  (1.727 m)   Wt 282 lb (127.9 kg)   SpO2 98%   BMI 42.88 kg/m  , BMI Body mass index is 42.88 kg/m.     GEN: Well nourished, well developed, in no acute distress. HEENT: normal. Neck: Supple, no JVD, carotid bruits, or masses. Cardiac: RRR, no murmurs, rubs, or gallops. No clubbing, cyanosis, edema.  Radials 2+/PT 2+ and equal bilaterally.  Respiratory:  Respirations regular and unlabored, clear to auscultation bilaterally. GI: Soft, nontender, nondistended, BS + x 4. MS: no deformity or  atrophy. Skin: warm and dry, no rash. Neuro:  Strength and sensation are intact. Psych: Normal affect.  Accessory Clinical Findings    ECG personally reviewed by me today-no new tracings were completed today  Lab Results  Component Value Date   WBC 3.7 (L) 05/16/2022   HGB 13.1 05/16/2022   HCT 39.5 05/16/2022   MCV 84.2 05/16/2022   PLT 181 05/16/2022   Lab Results  Component Value Date   CREATININE 0.84 05/16/2022   BUN 11 05/16/2022   NA 132 (L) 05/16/2022   K 3.7 05/16/2022   CL 101 05/16/2022   CO2 22 05/16/2022   Lab Results  Component Value Date   ALT 24 05/16/2022   AST 26 05/16/2022   ALKPHOS 52 05/16/2022   BILITOT 0.6 05/16/2022   Lab Results  Component Value Date   CHOL 88 12/08/2021   HDL 34 (L) 12/08/2021   LDLCALC 41 12/08/2021   TRIG 63 12/08/2021   CHOLHDL 2.6 12/08/2021    Lab Results  Component Value Date   HGBA1C 6.2 (A) 12/22/2021    Assessment & Plan   1.  Essential hypertension with blood pressure 130/82.  Since being on valsartan patient states his blood pressure has been better controlled.  Since his last visit to clinic he has had 2 emergency department visits with chest pain and shortness of breath and was found to have elevated blood pressure from both instances.  He is continued on Toprol-XL 25 mg daily and valsartan 320 mg daily.  He is also encouraged to continue monitoring his pressures at home.  2.  Coronary artery disease status post CABG without current angina.  Lexiscan MPI was low risk scan with no ischemia noted.  He is scheduled  for an echocardiogram to reevaluate EF after previous stress testing was completed.  He is continued on Imdur 60 mg daily, aspirin 81 mg daily, clopidogrel 75 mg daily, he also has Nitrostat 0.4 mg sublingual as needed.  3.  Mixed hyperlipidemia with an LDL 41.  He has been continued on atorvastatin 80 mg daily and Zetia 10 mg daily  4.  Type 2 diabetes with last hemoglobin A1c of 6.2.  He is  continued on insulin therapy that is managed and followed by his PCP.  5.  Chest pain and chest tightness with associated shortness of breath previously required nitro on occasion has resolved since blood pressure has been better controlled.  He is scheduled for an echocardiogram to reevaluate heart function.  6.  Disposition patient to return to clinic to see Dr. Rockey Situ or APP after echocardiogram is completed.  Jerell Demery, NP 06/11/2022, 2:51 PM

## 2022-06-11 NOTE — Patient Instructions (Signed)
Medication Instructions:  Your Physician recommend you continue on your current medication as directed.    *If you need a refill on your cardiac medications before your next appointment, please call your pharmacy*   Lab Work: None ordered today   Testing/Procedures: None ordered today   Follow-Up: At [facility], you and your health needs are our priority.  As part of our continuing mission to provide you with exceptional heart care, we have created designated Provider Care Teams.  These Care Teams include your primary Cardiologist (physician) and Advanced Practice Providers (APPs -  Physician Assistants and Nurse Practitioners) who all work together to provide you with the care you need, when you need it.  We recommend signing up for the patient portal called "MyChart".  Sign up information is provided on this After Visit Summary.  MyChart is used to connect with patients for Virtual Visits (Telemedicine).  Patients are able to view lab/test results, encounter notes, upcoming appointments, etc.  Non-urgent messages can be sent to your provider as well.   To learn more about what you can do with MyChart, go to [email or web address].    Your next appointment:   2 month(s)  The format for your next appointment:   In Person  Provider:   Julien Nordmann, MD

## 2022-06-20 ENCOUNTER — Other Ambulatory Visit: Payer: Self-pay | Admitting: Family Medicine

## 2022-06-20 DIAGNOSIS — E1151 Type 2 diabetes mellitus with diabetic peripheral angiopathy without gangrene: Secondary | ICD-10-CM

## 2022-06-21 ENCOUNTER — Telehealth: Payer: Medicaid Other

## 2022-06-21 NOTE — Telephone Encounter (Signed)
Unable to refill per protocol, Rx request is too soon. Last refill 05/17/22 for 100 and 12 refills. Will refuse duplicate request.  Requested Prescriptions  Pending Prescriptions Disp Refills   Blood Glucose Monitoring Suppl (ACCU-CHEK GUIDE ME) w/Device KIT [Pharmacy Med Name: [place]  0    Sig: USE TO [place] BLOOD SUGAR UP TO [place] A DAY     Endocrinology: Diabetes - Testing Supplies Passed - 06/20/2022  1:03 PM      Passed - Valid encounter within last 12 months    Recent Outpatient Visits           2 weeks ago OSA on CPAP   [place], DO   3 weeks ago Essential hypertension   [place], Grayland Ormond A, RPH-CPP   1 month ago DM (diabetes mellitus), type 2 with peripheral vascular complications [facility])   [facility] [place], Virl Diamond, RPH-CPP   1 month ago Essential hypertension   [place], DO   6 months ago DM (diabetes mellitus), type 2 with peripheral vascular complications [facility])   [facility] [name], DO       Future Appointments             In 1 month [place] Ranger, Devonne Doughty, DO [facility], [place]   In 1 month Gollan, Kathlene November, MD [place] [place]

## 2022-06-27 ENCOUNTER — Encounter: Payer: Self-pay | Admitting: Emergency Medicine

## 2022-06-27 ENCOUNTER — Other Ambulatory Visit: Payer: Self-pay

## 2022-06-27 DIAGNOSIS — I2510 Atherosclerotic heart disease of native coronary artery without angina pectoris: Secondary | ICD-10-CM | POA: Insufficient documentation

## 2022-06-27 DIAGNOSIS — I10 Essential (primary) hypertension: Secondary | ICD-10-CM | POA: Insufficient documentation

## 2022-06-27 DIAGNOSIS — Z951 Presence of aortocoronary bypass graft: Secondary | ICD-10-CM | POA: Insufficient documentation

## 2022-06-27 DIAGNOSIS — R079 Chest pain, unspecified: Secondary | ICD-10-CM | POA: Diagnosis not present

## 2022-06-27 DIAGNOSIS — E119 Type 2 diabetes mellitus without complications: Secondary | ICD-10-CM | POA: Insufficient documentation

## 2022-06-27 DIAGNOSIS — R0789 Other chest pain: Secondary | ICD-10-CM | POA: Diagnosis not present

## 2022-06-27 NOTE — ED Provider Triage Note (Signed)
Emergency Medicine Provider Triage Evaluation Note  Craig Harvey , a 66 y.o. male  was evaluated in triage.  Pt complains of chest pressure diaphoresis and dyspnea while seated.  Resolved with nitroglycerin.  History of CAD with a stent placed this past summer.  No current pain  Twelve-lead nonspecific changes but no STEMI.  No active pain.  I urged him to let us know if his pain returns  Review of Systems  Positive:  Negative:   Physical Exam  BP (!) 149/92 (BP Location: Right Arm)   Pulse 71   Temp 98.6 F (37 C) (Oral)   Resp 17   Ht 5\' 8"  (1.727 m)   Wt 127.9 kg   SpO2 95%   BMI 42.88 kg/m  Gen:   Awake, no distress   Resp:  Normal effort  MSK:   Moves extremities without difficulty  Other:    Medical Decision Making  Medically screening exam initiated at 11:52 PM.  Appropriate orders placed.  was informed that the remainder of the evaluation will be completed by another provider, this initial triage assessment does not replace that evaluation, and the importance of remaining in the ED until their evaluation is complete.  CP protocols   [name], MD 06/27/22 2352

## 2022-06-27 NOTE — ED Triage Notes (Signed)
To triage from home via [facility] with c/o substernal chest pain, diaphoresis and hypertension at home.  0.4 mg SL nitro x 2 with EMS 324 ASA 20G L AC

## 2022-06-27 NOTE — ED Triage Notes (Signed)
Pt BIB EMS form home c/o centralized chest pain that started at 830 pm tonight while at rest talking on the phone. Pain described as tightness associated with SOB, weakness, lightlessness, and diaphoresis. In triage pt verbalized improvement of pain, rating pain 4/10, following NTG. Endorses cardiac hx, including stent earlier this year.

## 2022-06-28 ENCOUNTER — Emergency Department
Admission: EM | Admit: 2022-06-28 | Discharge: 2022-06-28 | Disposition: A | Discharge status: Home / Self Care | Payer: Medicaid Other | Attending: Emergency Medicine | Admitting: Emergency Medicine

## 2022-06-28 ENCOUNTER — Emergency Department: Payer: Medicaid Other

## 2022-06-28 DIAGNOSIS — R079 Chest pain, unspecified: Secondary | ICD-10-CM

## 2022-06-28 LAB — CBC
HCT: 39.6 % (ref 39.0–52.0)
Hemoglobin: 12.8 g/dL — ABNORMAL LOW (ref 13.0–17.0)
MCH: 27.9 pg (ref 26.0–34.0)
MCHC: 32.3 g/dL (ref 30.0–36.0)
MCV: 86.5 fL (ref 80.0–100.0)
Platelets: 210 10*3/uL (ref 150–400)
RBC: 4.58 MIL/uL (ref 4.22–5.81)
RDW: 13.2 % (ref 11.5–15.5)
WBC: 6.2 10*3/uL (ref 4.0–10.5)
nRBC: 0 % (ref 0.0–0.2)

## 2022-06-28 LAB — BASIC METABOLIC PANEL
Anion gap: 11 (ref 5–15)
BUN: 16 mg/dL (ref 8–23)
CO2: 22 mmol/L (ref 22–32)
Calcium: 9.1 mg/dL (ref 8.9–10.3)
Chloride: 99 mmol/L (ref 98–111)
Creatinine, Ser: 0.93 mg/dL (ref 0.61–1.24)
GFR, Estimated: >60 mL/min (ref >60)
Glucose, Bld: 81 mg/dL (ref 70–99)
Potassium: 4.3 mmol/L (ref 3.5–5.1)
Sodium: 132 mmol/L — ABNORMAL LOW (ref 135–145)

## 2022-06-28 LAB — CBG MONITORING, ED: Glucose-Capillary: 79 mg/dL (ref 70–99)

## 2022-06-28 LAB — TROPONIN I (HIGH SENSITIVITY)
Troponin I (High Sensitivity): 31 ng/L — ABNORMAL HIGH (ref <18)
Troponin I (High Sensitivity): 32 ng/L — ABNORMAL HIGH (ref <18)

## 2022-06-28 NOTE — Discharge Instructions (Signed)
Your EKG and heart enzymes were reassuring today.  Continue to take your blood pressure medication as prescribed.  Monitor your blood pressure and glucose daily.  If your chest pain is changing or worsening please return to the emergency department.

## 2022-06-28 NOTE — ED Provider Notes (Signed)
[facility] Provider Note    None    (approximate)   History   Chest Pain   HPI  Craig Harvey is a 66 y.o. male with past medical history of coronary disease status post three-vessel CABG and stent to native proximal RCA, hypertension hyperlipidemia insulin-dependent diabetes obesity sleep apnea who presents with elevated blood pressure.  Patient tells me around 8 PM he was sitting watching television when he started sweating felt like his blood pressure went up so checked and it was in the 200s systolic.  Also checked his blood sugar and it was in the 60s so he ate a part of the moon pie.  He also endorses a cramping like muscle pain in the bilateral pec region that he says he always has and was somewhat worse during this episode and improved with nitroglycerin he was given by EMS.  Says that this is feels similar to prior episodes he has had as well typically happens when his blood pressure goes up.  Currently patient says he has like a muscle soreness in the chest but no significant pain.  Denies dyspnea or ongoing diaphoresis lightheadedness nausea or vomiting.  Has been compliant with his medication.  Says his blood sugars have been running in the 70s to 120s.  He takes Humalog and metformin.  Patient last saw cardiology on 12/8.  It is noted that he has had several episodes of chest pain requiring ED visit but has been discharged most times.  He is scheduled to have repeat echo.  His last Lexiscan MPI was low risk scan without ischemia.     Past Medical History:  Diagnosis Date   CAD (coronary artery disease)    a. 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 cardiac cath 01/09/2015: ostLAD 70:, mLAD 99%, ost to pLCx 95%, mLCx 80%, LVEF nl, recommend CABGl; c. s/p 3v  CABG 01/14/15 LIMA-LAD, SVG-OM, SVG-Ramus   History of echocardiogram    a. 01/2015 Echo: EF 60-65%, no rwma, nl RV fxn. Nl PASP.   Hyperlipidemia LDL goal <70    Hypertension    Morbid obesity (HCC)    OSA (obstructive sleep apnea)    Type II diabetes mellitus [facility])     Patient Active Problem List   Diagnosis Date Noted   Essential hypertension    Precordial pain    Leg cramping 01/09/2020   CAD (coronary artery disease), native coronary artery 01/22/2018   Spondylosis of lumbar region without myelopathy or radiculopathy 10/26/2017   Chronic pain of left lower extremity 10/26/2017   Hypertension    Hyperlipidemia associated with type 2 diabetes mellitus (HCC)    CAD s/p CABG x 3 01/14/2015   Atypical chest pain 01/09/2015   DM (diabetes mellitus), type 2 with peripheral vascular complications (HCC)    OSA on CPAP    Obesity, Class III, BMI 40-49.9 (morbid obesity) (HCC)      Physical Exam  Triage Vital Signs: ED Triage Vitals  Enc Vitals Group     BP 06/27/22 2349 (!) 149/92     Pulse Rate 06/27/22 2349 71     Resp 06/27/22 2349 17     Temp 06/27/22 2349 98.6 F (37 C)     Temp Source 06/27/22 2349 Oral     SpO2 06/27/22 2349 95 %  Weight 06/27/22 2350 282 lb (127.9 kg)     Height 06/27/22 2350 5\' 8"  (1.727 m)     Head Circumference --      Peak Flow --      Pain Score 06/27/22 2350 4     Pain Loc --      Pain Edu? --      Excl. in GC? --     Most recent vital signs: Vitals:   06/28/22 0236 06/28/22 0526  BP: (!) 158/100 (!) 144/97  Pulse: 63 65  Resp: 18 17  Temp: 97.9 F (36.6 C) 98.1 F (36.7 C)  SpO2: 98% 97%     General: Awake, no distress.  CV:  Good peripheral perfusion.  No peripheral edema Resp:  Normal effort.  Lungs are clear Abd:  No distention.  Neuro:             Awake, Alert, Oriented x 3  Other:     ED Results / Procedures / Treatments  Labs (all labs ordered are listed, but only abnormal results are displayed) Labs Reviewed   BASIC METABOLIC PANEL - Abnormal; Notable for the following components:      Result Value   Sodium 132 (*)    All other components within normal limits  CBC - Abnormal; Notable for the following components:   Hemoglobin 12.8 (*)    All other components within normal limits  TROPONIN I (HIGH SENSITIVITY) - Abnormal; Notable for the following components:   Troponin I (High Sensitivity) 32 (*)    All other components within normal limits  TROPONIN I (HIGH SENSITIVITY) - Abnormal; Notable for the following components:   Troponin I (High Sensitivity) 31 (*)    All other components within normal limits  CBG MONITORING, ED     EKG  EKG reviewed interpreted myself shows normal sinus rhythm inferior Q waves diffuse T wave flattening poor R wave progression similar to prior EKG   RADIOLOGY I reviewed and interpreted the CXR which does not show any acute cardiopulmonary process    PROCEDURES:  Critical Care performed: No  Procedures   MEDICATIONS ORDERED IN ED: Medications - No data to display   IMPRESSION / MDM / ASSESSMENT AND PLAN / ED COURSE  I reviewed the triage vital signs and the nursing notes.                              Patient's presentation is most consistent with acute presentation with potential threat to life or bodily function.  Differential diagnosis includes, but is not limited to, ACS, unstable angina, hypoglycemia, hypertensive urgency/emergency, asymptomatic hypertension  The patient is a 66 year old male with significant coronary disease status post CABG presents primarily because of an elevated blood pressure.  This occurred around 12 hours prior to my evaluation he was just sitting watching TV when he became diaphoretic checked his blood pressure in the 200 systolic, and at the same time his blood sugar was 60 which improved after he ate.  He really does not bring up any chest pain to me until I asked him about it specifically says that he was having  some what he describes as muscle soreness in the bilateral PACs which she has had before and did seem to get somewhat improved with nitroglycerin.  He is not having any severe chest pain currently but does endorse some ongoing muscle tightness which he says he always has ever since he had  his CABG.  The diaphoresis has improved and he denies any dyspnea or lightheadedness nausea or vomiting.  Patient's blood pressure here is relatively well-controlled he is 144/97 on my evaluation.  Patient has 2 troponins which are flat in the 30s and are actually less than when he was last seen.  His EKG looks similar to prior and is nonischemic.  Chest x-ray is clear the rest of his blood work including CBC and BMP are reassuring.  Question whether patient's hypertension at home was primarily driven by his hypoglycemia and sympathetic response to that as his blood pressure has improved here.  His chest symptoms are quite atypical and given his flat troponins no typical chest pain and no EKG changes that are acute I think he will be appropriate for follow-up.  Last saw cardiology at the beginning of this month and is scheduled for an outpatient echo.  I encouraged him to keep the appointment for the repeat echo and to return to ED for any worsening symptoms otherwise follow-up with cardiology.  Did repeat blood sugar here and it is 80.       FINAL CLINICAL IMPRESSION(S) / ED DIAGNOSES   Final diagnoses:  Chest pain, unspecified type     Rx / DC Orders   ED Discharge Orders     None        Note:  This document was prepared using Dragon voice recognition software and may include unintentional dictation errors.   Georga Hacking, MD 06/28/22 [phone]

## 2022-07-01 ENCOUNTER — Other Ambulatory Visit: Payer: Self-pay | Admitting: Family Medicine

## 2022-07-01 DIAGNOSIS — E1151 Type 2 diabetes mellitus with diabetic peripheral angiopathy without gangrene: Secondary | ICD-10-CM

## 2022-07-01 NOTE — Progress Notes (Signed)
Cardiology Clinic Note   Patient Name: Craig Harvey Date of Encounter: 07/02/2022  Primary Care Provider:  [name], DO Primary Cardiologist:  Ida Rogue, MD  Patient Profile    Craig Harvey is a 66 y.o. male with a past medical history of CAD s/p CABG x 05 January 2015 and PCI with balloon angioplasty and DES proximal RCA June 2023, hypertension, hyperlipidemia, obesity, OSA on CPAP, T2DM who presents to the clinic today for hospital follow-up.   Past Medical History    Past Medical History:  Diagnosis Date   CAD (coronary artery disease)    a. 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 cardiac cath 01/09/2015: ostLAD 70:, mLAD 99%, ost to pLCx 95%, mLCx 80%, LVEF nl, recommend CABGl; c. s/p 3v CABG 01/14/15 LIMA-LAD, SVG-OM, SVG-Ramus   History of echocardiogram    a. 01/2015 Echo: EF 60-65%, no rwma, nl RV fxn. Nl PASP.   Hyperlipidemia LDL goal <70    Hypertension    Morbid obesity (HCC)    OSA (obstructive sleep apnea)    Type II diabetes mellitus ([place])    Past Surgical History:  Procedure Laterality Date   CARDIAC CATHETERIZATION N/A 01/09/2015   Procedure: Left Heart Cath and Coronary Angiography;  Surgeon: Minna Merritts, MD;  Location: [facility];  Service: Cardiovascular;  Laterality: N/A;   CORONARY ARTERY BYPASS GRAFT N/A 01/14/2015   Procedure: CORONARY ARTERY BYPASS GRAFTING (CABG), ON PUMP, TIMES THREE, USING LEFT INTERNAL MAMMARY ARTERY, RIGHT GREATER SAPHENOUS VEIN HARVESTED ENDOSCOPICALLY;  Surgeon: Ivin Poot, MD;  Location: [place];  Service: Open Heart Surgery;  Laterality: N/A;   CORONARY STENT INTERVENTION N/A 12/10/2021   Procedure: CORONARY STENT INTERVENTION;  Surgeon: Leonie Man, MD;  Location: [facility];  Service:  Cardiovascular;  Laterality: N/A;   HERNIA REPAIR     LEFT HEART CATH AND CORONARY ANGIOGRAPHY N/A 12/10/2021   Procedure: LEFT HEART CATH AND CORONARY ANGIOGRAPHY;  Surgeon: Leonie Man, MD;  Location: [facility];  Service: Cardiovascular;  Laterality: N/A;   TEE WITHOUT CARDIOVERSION N/A 01/14/2015   Procedure: TRANSESOPHAGEAL ECHOCARDIOGRAM (TEE);  Surgeon: Ivin Poot, MD;  Location: [place];  Service: Open Heart Surgery;  Laterality: N/A;    Allergies  Allergies  Allergen Reactions   Trulicity [Dulaglutide] Other (See Comments)    Headaches     History of Present Illness    Craig Harvey has a past medical history of: CAD.  Echo 01/06/2015: EF 60-65%. No wall motion abnormalities.   2-day nuclear stress test 01/08/2015: High risk study. Large defect of moderate severity in mid anterior, mid anteroseptal, mid anterolateral, apical anterior, apical inferior, apical lateral and apex location. Findings consistent with ischemia and prior MI with peri-infarct ischemia. Hypertensive response to exercise with poor exercise tolerance.  LHC 01/09/2015: Ostial to proximal Cx 95%, ostial LAD 70%, mid Cx 80%, mid LAD 99%. Given unstable angina, severity of proximal LAD lesion, presence of calcified ostial LAD stenosis, as well as multiple regions of stenosis requiring intervention, evaluation for bypass surgery was recommended.  CABG x 3 01/14/2015: LIMA to LAD, SVG to OM, SVG to ramus.  Echo 12/08/2021: EF 60-65%. Grade I DD. Aortic dilatation 39 mm. LHC 12/10/2021: 100% CTA proximal LCx after OM1/RI/ 90%  followed by 100% CTO LAD after SP1. Wildly patent ostial RI. Proximal RCA 95% focal napkin ring lesion. Distal LAD tandem 50% lesions. Distal LCx 80% prior to anastomosis. Patent LIMA to LAD and SVG to OM. Flush occlusion SVG to RI. Successful scoring balloon angioplasty and PCI with DES 3.5 x 15 mm to proximal RCA.  Nuclear stress test 04/21/2022: No evidence of ischemia, low risk study.   Syncope.   14 day Zio 01/07/2022: NSR. First degree block present. Second degree AV block present. 3 runs NSVT fastest lasting 10 beats, 6 runs of SVT fastest lasting 5 beats and longest lasting 7 beats. Rare PVCs and PACs. No sustained arrhythmia.  Hypertension. Hyperlipidemia.  Lipid panel 12/08/2021: LDL 41, HDL 34, TG 63, total 88. Obesity.  OSA. On CPAP.  T2DM.   Craig Harvey was first evaluated by cardiology during hospitalization for chest pain on 01/06/2015. Echo showed EF 60-65% with no wall motion abnormalities. Two-day nuclear stress test was high risk with large partially reversible defect in the likely LAD distribution. LHC severe two vessel disease as above. Patient referred to CT surgery. Patient was essentially stable until he was hospitalized in June 2023 for unstable angina. Echo showed EF 60-65%, grade I DD, and mild aortic dilatation. LHC showed 2/3 grafts patent and culprit RCA lesion with successful angioplasty and stenting as above. He was evaluated in the office after an ED visit on 12/15/2021 for syncope after a blood draw. 14 day Zio was placed with results as above. Since LHC patient has ongoing chest pain and poorly controlled hypertension. He underwent stress testing in October 2023 which was a low risk study. Patient has been evaluated in the ED 4 times since June for chest pain and poorly controlled hypertension. He was last seen in the office by Gerrie Nordmann, NP on 06/11/2022 with slightly better controlled blood pressure after being changed to Valsartan by his PCP. He did complain of occasional dizziness. Chest pain is better since BP is better controlled. He was scheduled for an echo to re-evaluate heart function.   Most recently, patient was seen in the ED on 06/27/2022 for substernal chest pain, diaphoresis and hypertension. Patient reported improved pain after NTG x 2 from EMS. His troponin was flat at 32 and 31 and he had no EKG changes. He was discharged home to follow-up  with outpatient cardiology.   Today, patient reports he continues to have pain across his chest. He states he feels pain is muscular in nature and describes it as tightness and soreness. Pain can be reproduced with palpation and is relieved with baclofen and sometimes Pepcid. Patient states he tries to exercise with an exercise band but sometimes has to stop secondary to muscular soreness. Patient reports chest pain is not what causes him to go to the emergency room. He states he was told by his doctor if his BP gets high he should go to the ED. He shows a BP log from home. He checks his BP at 12pm and 5pm daily. BP ranges from 120-130s/80s in the afternoon and 140-160s/80s in the evenings with some DBP readings >100. He takes his medications in the morning. BP today 140/92. Patient also reports having some issues with his blood sugar dropping. He recently decreased insulin and will follow-up with PCP to further titrate his medications. He denies lower extremity swelling, shortness of breath or DOE. No dizziness or headaches. No palpitations. Patient is encouraged to discuss with his PCP starting physical therapy to strengthen his  upper body musculature.    Home Medications    Current Meds  Medication Sig   Accu-Chek Softclix Lancets lancets Use to check blood sugar up to 3 x daily   aluminum-magnesium hydroxide-simethicone (MAALOX) 200-200-20 MG/5ML SUSP Take 30 mLs by mouth 4 (four) times daily -  before meals and at bedtime.   aspirin EC 81 MG tablet Take 1 tablet (81 mg total) by mouth daily.   atorvastatin (LIPITOR) 80 MG tablet Take 1 tablet (80 mg total) by mouth daily.   baclofen (LIORESAL) 10 MG tablet TAKE ONE-HALF TO ONE TABLET BY MOUTH TWICE DAILY AS NEEDED FOR MUSCLE SPASM (LEG PAIN)   BD VEO INSULIN SYRINGE U/F 31G X 15/64" 0.3 ML MISC USE AS DIRECTED TWICE DAILY   Blood Glucose Monitoring Suppl (ACCU-CHEK GUIDE ME) w/Device KIT Use to check blood sugar up to 3 x daily   clopidogrel  (PLAVIX) 75 MG tablet Take 1 tablet (75 mg total) by mouth daily.   ezetimibe (ZETIA) 10 MG tablet Take 1 tablet (10 mg total) by mouth daily.   famotidine (PEPCID) 20 MG tablet Take 1 tablet (20 mg total) by mouth 2 (two) times daily.   glucose blood (ACCU-CHEK GUIDE) test strip Use to check blood sugar up to 3 x daily   insulin lispro protamine-lispro (HUMALOG MIX 75/25) (75-25) 100 UNIT/ML SUSP injection INJECT 17 UNITS UNDER THE SKIN TWICE DAILY WITH A MEAL. MAX OF 50 UNITS DAILY. (Patient taking differently: INJECT 15 UNITS UNDER THE SKIN TWICE DAILY WITH A MEAL. MAX OF 50 UNITS DAILY.)   isosorbide mononitrate (IMDUR) 60 MG 24 hr tablet Take 1 tablet (60 mg total) by mouth daily.   metFORMIN (GLUCOPHAGE) 1000 MG tablet TAKE 1 TABLET BY MOUTH TWICE DAILY WITH A MEAL   metoCLOPramide (REGLAN) 10 MG tablet Take 1 tablet (10 mg total) by mouth every 8 (eight) hours as needed for nausea (heart burn).   metoprolol succinate (TOPROL-XL) 25 MG 24 hr tablet Take 1 tablet (25 mg total) by mouth daily.   nitroGLYCERIN (NITROSTAT) 0.4 MG SL tablet Place 1 tablet (0.4 mg total) under the tongue every 5 (five) minutes as needed for chest pain.   Syringe, Disposable, 3 ML MISC Use to inject humalog mix 75/25 17u BID   valsartan (DIOVAN) 320 MG tablet Take 320 mg by mouth daily.    Family History    Family History  Problem Relation Age of Onset   CAD Mother    Throat cancer Father    CAD Brother    Diabetes Neg Hx    Cancer Neg Hx    Prostate cancer Neg Hx    Colon cancer Neg Hx    He indicated that his mother is deceased. He indicated that his father is deceased. He indicated that his brother is alive. He indicated that the status of his neg hx is unknown.   Social History    Social History   Socioeconomic History   Marital status: Single    Spouse name: Not on file   Number of children: Not on file   Years of education: Not on file   Highest education level: Not on file  Occupational  History   Occupation: Former Administrator / Patent attorney  Tobacco Use   Smoking status: Former    Types: Cigarettes    Quit date: 08/06/1975    Years since quitting: 46.9   Smokeless tobacco: Never  Vaping Use   Vaping Use: Never used  Substance  and Sexual Activity   Alcohol use: No   Drug use: No   Sexual activity: Yes  Other Topics Concern   Not on file  Social History Narrative   Not on file   Social Determinants of Health   Financial Resource Strain: Low Risk  (05/20/2021)   Overall Financial Resource Strain (CARDIA)    Difficulty of Paying Living Expenses: Not hard at all  Food Insecurity: No Food Insecurity (05/20/2021)   Hunger Vital Sign    Worried About Running Out of Food in the Last Year: Never true    Ran Out of Food in the Last Year: Never true  Transportation Needs: No Transportation Needs (05/20/2021)   PRAPARE - Hydrologist (Medical): No    Lack of Transportation (Non-Medical): No  Physical Activity: Sufficiently Active (05/20/2021)   Exercise Vital Sign    Days of Exercise per Week: 4 days    Minutes of Exercise per Session: 40 min  Stress: No Stress Concern Present (05/20/2021)   [place]    Feeling of Stress : Not at all  Social Connections: Socially Isolated (05/20/2021)   Social Connection and Isolation Panel [NHANES]    Frequency of Communication with Friends and Family: Three times a week    Frequency of Social Gatherings with Friends and Family: Three times a week    Attends Religious Services: Never    Active Member of Clubs or Organizations: No    Attends Archivist Meetings: Never    Marital Status: Never married  Human resources officer Violence: Not on file     Review of Systems    General:  No chills, fever, night sweats or weight changes.  Cardiovascular:  No chest pain, dyspnea on exertion, edema, orthopnea, palpitations,  paroxysmal nocturnal dyspnea. Dermatological: No rash, lesions/masses Respiratory: No cough, dyspnea Urologic: No hematuria, dysuria Abdominal:   No nausea, vomiting, diarrhea, bright red blood per rectum, melena, or hematemesis Neurologic:  No visual changes, weakness, changes in mental status. Musculoskeletal: Pain across upper chest. All other systems reviewed and are otherwise negative except as noted above.  Physical Exam    VS:  BP (!) 140/92 (BP Location: Left Arm, Patient Position: Sitting, Cuff Size: Large)   Pulse 80   Ht _0  (1.727 m)   Wt 280 lb (127 kg)   SpO2 98%   BMI 42.57 kg/m  , BMI Body mass index is 42.57 kg/m. GEN:  Well nourished, well developed, in no acute distress. HEENT: Normal. Neck: Supple, no JVD, carotid bruits, or masses. Cardiac: RRR, no murmurs, rubs, or gallops. No clubbing, cyanosis, edema.  Radials/DP/PT 2+ and equal bilaterally.  Respiratory:  Respirations regular and unlabored, clear to auscultation bilaterally. GI: Soft, nontender, nondistended. MS: No deformity or atrophy. Mild tenderness to palpation across upper chest. Patient states pain is identical to pain he had during past visits to ED.  Skin: Warm and dry, no rash. Neuro: Strength and sensation are intact. Psych: Normal affect.  Accessory Clinical Findings   Recent Labs: 05/11/2022: Magnesium 1.8; TSH 1.998 05/16/2022: ALT 24 06/27/2022: BUN 16; Creatinine, Ser 0.93; Hemoglobin 12.8; Platelets 210; Potassium 4.3; Sodium 132   Recent Lipid Panel    Component Value Date/Time   CHOL 88 12/08/2021 1243   CHOL 158 06/09/2016 1202   TRIG 63 12/08/2021 1243   HDL 34 (L) 12/08/2021 1243   HDL 46 06/09/2016 1202   CHOLHDL 2.6 12/08/2021 1243  VLDL 13 12/08/2021 1243   LDLCALC 41 12/08/2021 1243   LDLCALC 39 07/23/2021 0845    HYPERTENSION CONTROL Vitals:   07/02/22 1318 07/02/22 1413  BP: (!) 140/92 (!) 140/90    The patient's blood pressure is elevated above target  today.  In order to address the patient's elevated BP: A new medication was prescribed today.     ECG personally reviewed by me today: Sinus rhythm with 1st degree AV block, HR 80.  No significant changes from 06/27/2022.    Assessment & Plan   CAD/Chest pain. S/p CABG x 3 (LIMA to LAD, SVG to OM1, SVG to RI) July 2016 and angioplasty and DES to RCA in June 2023 with 2/3 grafts patent. Nuclear stress test in October 2023 showed no ischemia, low risk study. Patient feels chest pain is muscular in nature. Pain is relieved with baclofen.  He is tender to palpation across top of chest. He states when BP goes up or blood sugar drops he feels the tightness across his chest. He gets the same pain when he does upper body exercises with an exercise band. The last several times he has been to the ED it has been because of hypertension not chest pain. Continue aspirin, Plavix, metoprolol, atorvastatin, Zetia, prn SL NTG.  Hypertension. BP today 140/92 at intake and 140/90 at recheck. His BP log from home shows BP checked at 12 pm typically in the 120-130s/80s and BP checked at 5 pm 140-160s/80s with some DBP readings >100.  Patient takes all of his medications in the AM. Will add amlodipine 5 mg to be taken at 5 pm. Patient will continue to monitor his BP at 12 pm but change evening check to 7 pm. Continue metoprolol and Valsartan.  Hyperlipidemia. LDL June 2023 41, at goal. Continue atorvastatin and Zetia.       Disposition: Amlodipine 5 mg to take at 5 pm. Continue BP log with checks at ~12 pm and 7 pm. Return in 3 months or sooner as needed.    Justice [place]. Fredderick Swanger, DNP, NP-C     07/02/2022, 1:26 PM [address] [place] [address] Office [phone] Fax [phone]

## 2022-07-01 NOTE — Telephone Encounter (Signed)
Requested medication (s) are due for refill today: routing for review  Requested medication (s) are on the active medication list: yes  Last refill:  07/23/21  Future visit scheduled: yes  Notes to clinic:  Unable to refill per protocol due to failed labs, no updated results.      Requested Prescriptions  Pending Prescriptions Disp Refills   HUMALOG MIX 75/25 (75-25) 100 UNIT/ML SUSP injection [Pharmacy Med Name: HumaLOG Mix 75/25 (75-25) 100 UNIT/ML Subcutaneous Suspension] 30 mL 0    Sig: INJECT 17 UNITS UNDER THE SKIN TWICE DAILY WITH A MEAL. MAX OF 50 UNITS DAILY.     Endocrinology:  Diabetes - Insulins Failed - 07/01/2022  9:40 AM      Failed - HBA1C is between 0 and 7.9 and within 180 days    Hemoglobin A1C  Date Value Ref Range Status  12/22/2021 6.2 (A) 4.0 - 5.6 % Final  04/18/2017 6.4  Final   Hgb A1c MFr Bld  Date Value Ref Range Status  07/23/2021 6.4 (H) <5.7 % of total Hgb Final    Comment:    For someone without known diabetes, a hemoglobin  A1c value between 5.7% and 6.4% is consistent with prediabetes and should be confirmed with a  follow-up test. . For someone with known diabetes, a value <7% indicates that their diabetes is well controlled. A1c targets should be individualized based on duration of diabetes, age, comorbid conditions, and other considerations. . This assay result is consistent with an increased risk of diabetes. . Currently, no consensus exists regarding use of hemoglobin A1c for diagnosis of diabetes for children. Verna Czech - Valid encounter within last 6 months    Recent Outpatient Visits           3 weeks ago OSA on CPAP   [facility] [place], Netta Neat, DO   1 month ago Essential hypertension   [facility] Delles, Gentry Fitz A, RPH-CPP   1 month ago DM (diabetes mellitus), type 2 with peripheral vascular complications [facility]   [facility] Delles, Jackelyn Poling,  RPH-CPP   1 month ago Essential hypertension   [facility] [place], Netta Neat, DO   6 months ago DM (diabetes mellitus), type 2 with peripheral vascular complications [facility])   [facility] Smitty Cords, DO       Future Appointments             In 3 weeks Althea Charon, Netta Neat, DO [facility]   In 1 month Gollan, Tollie Pizza, MD [facility] [place]

## 2022-07-02 ENCOUNTER — Ambulatory Visit: Payer: Medicaid Other | Attending: Physician Assistant | Admitting: Student

## 2022-07-02 ENCOUNTER — Ambulatory Visit: Payer: Medicaid Other | Admitting: Pharmacist

## 2022-07-02 ENCOUNTER — Encounter: Payer: Self-pay | Admitting: Cardiology

## 2022-07-02 VITALS — BP 140/90 | HR 80 | Ht 68.0 in | Wt 280.0 lb

## 2022-07-02 DIAGNOSIS — I10 Essential (primary) hypertension: Secondary | ICD-10-CM | POA: Diagnosis not present

## 2022-07-02 DIAGNOSIS — E1151 Type 2 diabetes mellitus with diabetic peripheral angiopathy without gangrene: Secondary | ICD-10-CM

## 2022-07-02 DIAGNOSIS — Z951 Presence of aortocoronary bypass graft: Secondary | ICD-10-CM

## 2022-07-02 DIAGNOSIS — R0789 Other chest pain: Secondary | ICD-10-CM | POA: Diagnosis not present

## 2022-07-02 DIAGNOSIS — E1169 Type 2 diabetes mellitus with other specified complication: Secondary | ICD-10-CM | POA: Diagnosis not present

## 2022-07-02 DIAGNOSIS — E785 Hyperlipidemia, unspecified: Secondary | ICD-10-CM

## 2022-07-02 MED ORDER — AMLODIPINE BESYLATE 5 MG PO TABS: ORAL_TABLET | ORAL | 0 refills | Status: DC

## 2022-07-02 NOTE — Patient Instructions (Signed)
Medication Instructions:  START Amlodipine 5 mg once daily at 5 pm  *If you need a refill on your cardiac medications before your next appointment, please call your pharmacy*   Lab Work: None ordered If you have labs (blood work) drawn today and your tests are completely normal, you will receive your results only by: MyChart Message (if you have MyChart) OR A paper copy in the mail If you have any lab test that is abnormal or we need to change your treatment, we will call you to review the results.   Testing/Procedures: None ordered   Follow-Up: At [facility], you and your health needs are our priority.  As part of our continuing mission to provide you with exceptional heart care, we have created designated Provider Care Teams.  These Care Teams include your primary Cardiologist (physician) and Advanced Practice Providers (APPs -  Physician Assistants and Nurse Practitioners) who all work together to provide you with the care you need, when you need it.  We recommend signing up for the patient portal called "MyChart".  Sign up information is provided on this After Visit Summary.  MyChart is used to connect with patients for Virtual Visits (Telemedicine).  Patients are able to view lab/test results, encounter notes, upcoming appointments, etc.  Non-urgent messages can be sent to your provider as well.   To learn more about what you can do with MyChart, go to [email or web address].    Your next appointment:   3 month(s)  The format for your next appointment:   In Person  Provider:   You may see Julien Nordmann, MD or one of the following Advanced Practice Providers on your designated Care Team:   Nicolasa Ducking, NP Eula Listen, PA-C Cadence Fransico Michael, PA-C Charlsie Quest, NP    Other Instructions Check your blood pressure daily.  Keep a journal of these daily blood pressure and heart rate readings and call our office or send a message through MyChart with the results.  Thank you!  It is best to check your BP 1-2 hours after taking your medications to see the medications effectiveness on your BP.    Here are some tips that our clinical pharmacists share for home BP monitoring:          Rest 10 minutes before taking your blood pressure.          Don't smoke or drink caffeinated beverages for at least 30 minutes before.          Take your blood pressure before (not after) you eat.          Sit comfortably with your back supported and both feet on the floor (don't cross your legs).          Elevate your arm to heart level on a table or a desk.          Use the proper sized cuff. It should fit smoothly and snugly around your bare upper arm. There should be enough room to slip a fingertip under the cuff. The bottom edge of the cuff should be 1 inch above the crease of the elbow.

## 2022-07-02 NOTE — Chronic Care Management (AMB) (Signed)
07/02/2022 Name: Craig Harvey MRN: [id number] DOB: 05/04/1956  Chief Complaint  Patient presents with   Medication Management    Craig Harvey is a 67 y.o. year old male who presented for a telephone visit.   They were referred to the pharmacist by their PCP for assistance in managing diabetes and hypertension.   Patient is participating in a Managed Medicaid Plan:  Yes   Perform chart review - Patient seen for Office Visit with PCP on 12/4. Provider advised patient: Double the Valsartan 169m tab x 2 = 324monce daily in morning for BP  [facility]. Provider advised patient to keep the appointment for the repeat echo and to return to ED for any worsening symptoms otherwise follow-up with cardiology  Patient scheduled for follow up with Cardiology today  Subjective:  Care Team: Primary Care Provider: KaOlin HauserDO ; Next Scheduled Visit: 07/06/2022 Cardiologist: CHStewart Next Scheduled Visit: 07/02/2022  Medication Access/Adherence  [facility] Pharm[facility]:  [facility] [id number]: [phone]: [phone] [place] [address] [id number]: [phone]: [phone] Patient reports affordability concerns with their medications: No  Patient reports access/transportation concerns to their pharmacy: No  Patient reports adherence concerns with their medications:  No     Hypertension:   [facility]    Current medications:  - metoprolol ER 25 mg daily - valsartan 160 mg daily - 2 tablets (320 mg) daily - isosorbide ER 60 mg daily   Previous therapies  tried: lisinopril, HCTZ (discontinued 12/2021 related to hyponatremia); amlodipine   Reports uses weekly pillbox to organize his medications; denies missed doses   Home Monitoring: Patient has an automated upper arm home BP machine Reports home machine is at least 1 40ear old and has noticed some wear on machine/readings higher than with office machine Counsel on BP monitoring technique Current blood pressure readings:   AM BP AM BP PM BP   21 - December     22 - December     23 - December 133/82, HR 67 131/82, HR 69   24 - December 131/81, HR 73 135/79, HR 74; 205/115, HR 84, 173/97, HR 73* [facility]  25 - December 140/90, HR 79 150/94, HR 80   26 - December 126/75, HR 67 123/76, HR 73   27 - December 150/97, HR 66 163/103, HR 63   28 - December 120/74, HR 78 140/82, HR 64   29 - December -       Per patient and provider, patient with history of labile blood pressure   Reports has noticed improvement in home blood pressure readings since started taking new valsartan dose as increased by PCP at 12/4 office visit   Current physical activity: walks ~10-15 minutes/day (reports duration limited by leg pain) x 7 days/week   OSA/CPAP: Previously reported unable to use CPAP consistently as finds it difficult to sleep on his back - From review of chart, note [facility] Patient [facility]22    Caffeine: Reports stopped drinking caffeine    Limiting salt/sodium in his diet. Reviews nutrition labels for sodium content   Diabetes:  Current medications:  - Humalog Mix 75/25 - Injects 17 units twice daily with meals - metformin 1000 mg twice daily  Current glucose readings: reports recently ranging 70-100 before both breakfast and supper - Reports 2 recent low readings in 60s - Attributes lower readings to eating less sugar and working on  losing weight with smaller portion sizes (lost ~30 lbs over past 6 months)  Current meal patterns: Breakfast: [place] bacon and eggs; lunch: [place] burger; low sodium raviolis with spinach; supper: stewed chicken and spinach or tossed salad; drink: water or carbonated water; snacks: peanut butter crackers  Current physical activity: walks ~10-15 minutes/day (reports duration limited by leg pain) x 7 days/week    Objective: Lab Results  Component Value Date   HGBA1C 6.2 (A) 12/22/2021    Lab Results  Component Value Date   CREATININE 0.93 06/27/2022   BUN 16 06/27/2022   NA 132 (L) 06/27/2022   K 4.3 06/27/2022   CL 99 06/27/2022   CO2 22 06/27/2022    Lab Results  Component Value Date   CHOL 88 12/08/2021   HDL 34 (L) 12/08/2021   LDLCALC 41 12/08/2021   TRIG 63 12/08/2021   CHOLHDL 2.6 12/08/2021   BP Readings from Last 3 Encounters:  06/28/22 (!) 144/97  06/11/22 130/82  06/07/22 (!) 138/94   Pulse Readings from Last 3 Encounters:  06/28/22 65  06/11/22 78  06/07/22 78     Medications Reviewed Today     Reviewed by Rennis Petty, RPH-CPP (Pharmacist) on 07/02/22 at 1248  Med List Status: <None>   Medication Order Taking? Sig Documenting Provider Last Dose Status Informant  Accu-Chek Softclix Lancets lancets [id number]  Use to check blood sugar up to 3 x daily Karamalegos, Devonne Doughty, DO  Active   aluminum-magnesium hydroxide-simethicone (MAALOX) 200-200-20 MG/5ML SUSP [id number]  Take 30 mLs by mouth 4 (four) times daily -  before meals and at bedtime. Carrie Mew, MD  Active   aspirin EC 81 MG tablet [id number]  Take 1 tablet (81 mg total) by mouth daily. Minna Merritts, MD  Active Pharmacy Records, Self  atorvastatin (LIPITOR) 80 MG tablet [id number]  Take 1 tablet (80 mg total) by mouth daily. Minna Merritts, MD  Active Pharmacy Records, Self  baclofen (LIORESAL) 10 MG tablet [id number]  TAKE ONE-HALF TO ONE TABLET BY MOUTH TWICE DAILY AS NEEDED  FOR MUSCLE SPASM (LEG PAIN) [name], DO  Active   BD VEO INSULIN SYRINGE U/F 31G X 15/64" 0.3 ML MISC [id number]  USE AS DIRECTED TWICE DAILY [place] Devonne Doughty, DO  Active Pharmacy Records, Self  Blood Glucose Monitoring Suppl (ACCU-CHEK GUIDE ME) w/Device KIT [id number]  Use to check blood sugar up to 3 x daily [name], DO  Active   clopidogrel (PLAVIX) 75 MG tablet [id number]  Take 1 tablet (75 mg total) by mouth daily. [name], DO  Active Pharmacy Records, Self  ezetimibe (ZETIA) 10 MG tablet [id number]  Take 1 tablet (10 mg total) by mouth daily. [name], DO  Active Pharmacy Records, Self  famotidine (PEPCID) 20 MG tablet [id number]  Take 1 tablet (20 mg total) by mouth 2 (two) times daily. Carrie Mew, MD  Active   glucose blood (ACCU-CHEK GUIDE) test strip [id number]  Use to check blood  sugar up to 3 x daily [name], DO  Active   insulin lispro protamine-lispro (HUMALOG MIX 75/25) (75-25) 100 UNIT/ML SUSP injection [id number] Yes INJECT 17 UNITS UNDER THE SKIN TWICE DAILY WITH A MEAL. MAX OF 50 UNITS DAILY. [name], DO Taking Active   isosorbide mononitrate (IMDUR) 60 MG 24 hr tablet [id number] Yes Take 1 tablet (60 mg total) by mouth daily. [name], DO Taking Active   metFORMIN (GLUCOPHAGE) 1000 MG tablet [id number] Yes TAKE 1 TABLET BY MOUTH TWICE DAILY WITH A MEAL Karamalegos, Devonne Doughty, DO Taking Active Pharmacy Records, Self  metoCLOPramide (REGLAN) 10 MG tablet [id number]  Take 1 tablet (10 mg total) by mouth every 8 (eight) hours as needed for nausea (heart burn). Karamalegos, Devonne Doughty, DO  Active   metoprolol succinate (TOPROL-XL) 25 MG 24 hr tablet [id number] Yes Take 1 tablet (25 mg total) by mouth daily. [name], DO Taking Active   nitroGLYCERIN (NITROSTAT) 0.4 MG SL tablet [id number]  Place 1 tablet (0.4 mg total) under the tongue every 5 (five)  minutes as needed for chest pain. Minna Merritts, MD  Active   Syringe, Disposable, 3 ML MISC [id number]  Use to inject humalog mix 75/25 17u BID [name], DO  Active Pharmacy Records, Self  valsartan (DIOVAN) 160 MG tablet [id number] Yes Take 2 tablets (320 mg total) by mouth daily. [name], DO Taking Active               Assessment/Plan:   Diabetes: - Reviewed long term cardiovascular and renal outcomes of uncontrolled blood sugar - Reviewed goal A1c, goal fasting, and goal 2 hour post prandial glucose - Reviewed dietary modifications including having regular, well-balanced meals, while controlling carbohydrate portion sizes - Counsel patient on s/s of low blood sugar and how to treat lows Review rules of 15s - importance of using 15 grams of sugar to treat low, recheck blood sugar in 15 minutes, treat again if remains low or, if back to normal, having meal if mealtime or snack  Review examples of sources of 15 grams of sugar Encourage patient to obtain glucose tablets - Discuss potential benefits of adding continuous glucose monitoring (CGM) to aid with blood sugar control and prevention of hypoglycemia   Will discuss again during future appointment - Collaborate with PCP to discuss patients recent blood sugar readings and medication management. Agree to plan to have patient reduce his Humalog Mix 75/25 dose to 15 units twice daily with meals (breakfast and supper).  Follow up with patient to discuss this dose change  Patient verbalizes understanding via teach back - Recommend patient to continue to monitor home blood sugar, keep log of the results and bring this record with him to upcoming appointment with PCP   Hypertension: - Reviewed long term cardiovascular and renal outcomes of uncontrolled blood pressure -  Collaborate with PCP to request provider send a prescription for a upper arm blood pressure monitor into eligible DME supplier for  patient to receive this monitor through his Medicaid coverage Provide patient with phone number for [employer] (Phone # [phone]) to follow up regarding new monitor and that he receives correct cuff size for his arm - Recommended to continue to check home blood pressure and heart rate, keep log of the results and bring this record with him to upcoming medical appointments    Follow Up Plan: Clinical Pharmacist will follow up with patient by telephone again on 08/30/2022 at  9:15 AM  Wallace Cullens, PharmD, Para March, CPP Clinical Pharmacist [facility] [phone]

## 2022-07-02 NOTE — Patient Instructions (Signed)
Goals Addressed             This Visit's Progress    Pharmacy Goals       Our goal A1c is less than 7%. This corresponds with fasting sugars less than 130 and 2 hour after meal sugars less than 180. Please keep a log of your results when checking your blood sugar   Our goal bad cholesterol, or LDL, is less than 70 . This is why it is important to continue taking your atorvastatin and ezetimibe.  Check your blood pressure daily, and any time you have concerning symptoms like headache, chest pain, dizziness, shortness of breath, or vision changes.   Our goal is less than 130/80.  To appropriately check your blood pressure, make sure you do the following:  1) Avoid caffeine, exercise, or tobacco products for 30 minutes before checking. Empty your bladder. 2) Sit with your back supported in a flat-backed chair. Rest your arm on something flat (arm of the chair, table, etc). 3) Sit still with your feet flat on the floor, resting, for at least 5 minutes.  4) Check your blood pressure. Take 1-2 readings.  5) Write down these readings and bring with you to any provider appointments.  Bring your home blood pressure machine with you to a provider's office for accuracy comparison at least once a year.   Make sure you take your blood pressure medications before you come to any office visit, even if you were asked to fast for labs.   [place] Sigmund Emmary Culbreath, PharmD, BCACP, CPP Clinical Pharmacist [facility] [place] [phone]         

## 2022-07-06 ENCOUNTER — Ambulatory Visit (INDEPENDENT_AMBULATORY_CARE_PROVIDER_SITE_OTHER): Payer: Medicaid Other | Admitting: Family Medicine

## 2022-07-06 ENCOUNTER — Telehealth: Payer: Self-pay | Admitting: Family Medicine

## 2022-07-06 ENCOUNTER — Other Ambulatory Visit: Payer: Self-pay | Admitting: Family Medicine

## 2022-07-06 ENCOUNTER — Encounter: Payer: Self-pay | Admitting: Family Medicine

## 2022-07-06 VITALS — BP 128/74 | HR 71 | Ht 68.0 in | Wt 278.4 lb

## 2022-07-06 DIAGNOSIS — G4733 Obstructive sleep apnea (adult) (pediatric): Secondary | ICD-10-CM

## 2022-07-06 DIAGNOSIS — E1151 Type 2 diabetes mellitus with diabetic peripheral angiopathy without gangrene: Secondary | ICD-10-CM | POA: Diagnosis not present

## 2022-07-06 DIAGNOSIS — R351 Nocturia: Secondary | ICD-10-CM

## 2022-07-06 DIAGNOSIS — Z1211 Encounter for screening for malignant neoplasm of colon: Secondary | ICD-10-CM | POA: Diagnosis not present

## 2022-07-06 DIAGNOSIS — I10 Essential (primary) hypertension: Secondary | ICD-10-CM

## 2022-07-06 DIAGNOSIS — F321 Major depressive disorder, single episode, moderate: Secondary | ICD-10-CM

## 2022-07-06 DIAGNOSIS — Z0000 Encounter for general adult medical examination without abnormal findings: Secondary | ICD-10-CM

## 2022-07-06 DIAGNOSIS — E1169 Type 2 diabetes mellitus with other specified complication: Secondary | ICD-10-CM

## 2022-07-06 DIAGNOSIS — I25118 Atherosclerotic heart disease of native coronary artery with other forms of angina pectoris: Secondary | ICD-10-CM

## 2022-07-06 MED ORDER — VALSARTAN 320 MG PO TABS: 320.0000 mg | ORAL_TABLET | Freq: Every day | ORAL | 3 refills | Status: DC

## 2022-07-06 MED ORDER — SERTRALINE HCL 50 MG PO TABS: 50.0000 mg | ORAL_TABLET | Freq: Every day | ORAL | 1 refills | Status: DC

## 2022-07-06 NOTE — Telephone Encounter (Signed)
.  pE

## 2022-07-06 NOTE — Patient Instructions (Addendum)
Thank you for coming to the office today.  Start new Sertraline (Zoloft generic) 50mg  daily for mood / depression symptoms, may take 4-6 weeks or more to take full effect. Improve mental health and mood. Check with [place] in a month and then again with [place] at end of February to see how you are doing on this med. Likely need it for 6-12+ months if working.  Keep on CPAP machine. Check with your supplier for the coverage of medical equipment cost.  Continue medication for BP Refilled Valsartan 320mg  daily - higher dose in one tab daily.  Recent Labs    07/23/21 0845 12/22/21 1401  HGBA1C 6.4* 6.2*   Agree with lower dose insulin 15 units now instead of 17 units.  DUE for FASTING BLOOD WORK (no food or drink after midnight before the lab appointment, only water or coffee without cream/sugar on the morning of)   Please schedule a Follow-up Appointment to: Return if symptoms worsen or fail to improve.  If you have any other questions or concerns, please feel free to call the office or send a message through [place]. You may also schedule an earlier appointment if necessary.  Additionally, you may be receiving a survey about your experience at our office within a few days to 1 week by e-mail or mail. We value your feedback.  [name], DO [place]

## 2022-07-06 NOTE — Progress Notes (Signed)
Subjective:    Patient ID: Craig Harvey, male    DOB: July 12, 1955, 67 y.o.   MRN: [id number]  TOD Craig Harvey is a 67 y.o. male presenting on 07/06/2022 for Hypertension   HPI  CHRONIC DM, Type 2: Recently updated by [facility] discussion from 17 to 15 units He feels better, less lightheaded Failed Trulicity due to side effect intolerance Improved CBGs 90-150 range Meds: Metformin 1000mg  BID, Humalog Vials 75/25 - 15u BID wc Asks for larger supply of insulin and if he can lower dose gradually Reports good compliance. Tolerating well w/o side-effects Currently on ACEi Lifestyle: - Diet (trying to improve diet - eliminated french fries, chocolate) UTD DM eye No significant hypoglycemia. Prior 1 or less a month, sometimes minimal, has CBG 90-100 with symptoms, but no significant low hypoglycemia  Depression, moderate, single episode Due to health concerns, mood has been down. Interested in medication  Hypertension History of Unstable angina s/p PCI stent Syncopal episode CAD / Vascular Disease / S/p CABG   Home BP readings 124/70, avg in AM is controlled 120-130s / 70-80s, and afternoon 4-5pm BP readings usually higher 140-150 SBP  He is doubling Valsartan 160 x 2 = 320mg  daily now improved. Needs new order.  ECHOCardiogram on 11/30 and Cardiology outpatient f/u after   He has risk factors with DM2, HYPERTENSION, HLD, OSA on CPAP   He has had some labile BP and elevation in BP cause his symptoms it seems.    Followed by [facility] Cardiology Dr Rockey Situ. history of CABG 01/2015   He is awaiting new home upper arm BP cuff that was ordered   OSA on CPAP Improved OSA control on CPAP, new supplies have been ordered. Using nightly. Improves his sleep     07/06/2022    3:17 PM 06/07/2022    2:30 PM 05/14/2022    2:22 PM  Depression screen PHQ 2/9  Decreased Interest 2 0 0  Down, Depressed, Hopeless 2 0 1  PHQ - 2 Score 4 0 1  Altered sleeping 1 0 0  Tired, decreased  energy 1 0 1  Change in appetite 0 0 0  Feeling bad or failure about yourself  1 0 0  Trouble concentrating 1 0 0  Moving slowly or fidgety/restless 0 0 0  Suicidal thoughts 0 0 0  PHQ-9 Score 8 0 2  Difficult doing work/chores Somewhat difficult Not difficult at all Not difficult at all    Social History   Tobacco Use   Smoking status: Former    Types: Cigarettes    Quit date: 08/06/1975    Years since quitting: 46.9   Smokeless tobacco: Never  Vaping Use   Vaping Use: Never used  Substance Use Topics   Alcohol use: No   Drug use: No    Review of Systems Per HPI unless specifically indicated above     Objective:    BP 128/74   Pulse 71   Ht 5\' 8"  (1.727 m)   Wt 278 lb 6.4 oz (126.3 kg)   SpO2 99%   BMI 42.33 kg/m   Wt Readings from Last 3 Encounters:  07/06/22 278 lb 6.4 oz (126.3 kg)  07/02/22 280 lb (127 kg)  06/27/22 282 lb (127.9 kg)    Physical Exam  Diabetic Foot Exam - Simple   Simple Foot Form Diabetic Foot exam was performed with the following findings: Yes 07/06/2022  3:22 PM  Visual Inspection No deformities, no ulcerations, no other skin breakdown  bilaterally: Yes Sensation Testing Intact to touch and monofilament testing bilaterally: Yes Pulse Check Posterior Tibialis and Dorsalis pulse intact bilaterally: Yes Comments      Results for orders placed or performed during the hospital encounter of 56/21/30  Basic metabolic panel  Result Value Ref Range   Sodium 132 (L) 135 - 145 mmol/L   Potassium 4.3 3.5 - 5.1 mmol/L   Chloride 99 98 - 111 mmol/L   CO2 22 22 - 32 mmol/L   Glucose, Bld 81 70 - 99 mg/dL   BUN 16 8 - 23 mg/dL   Creatinine, Ser 0.93 0.61 - 1.24 mg/dL   Calcium 9.1 8.9 - 10.3 mg/dL   GFR, Estimated >60 >60 mL/min   Anion gap 11 5 - 15  CBC  Result Value Ref Range   WBC 6.2 4.0 - 10.5 K/uL   RBC 4.58 4.22 - 5.81 MIL/uL   Hemoglobin 12.8 (L) 13.0 - 17.0 g/dL   HCT 39.6 39.0 - 52.0 %   MCV 86.5 80.0 - 100.0 fL   MCH 27.9  26.0 - 34.0 pg   MCHC 32.3 30.0 - 36.0 g/dL   RDW 13.2 11.5 - 15.5 %   Platelets 210 150 - 400 K/uL   nRBC 0.0 0.0 - 0.2 %  CBG monitoring, ED  Result Value Ref Range   Glucose-Capillary 79 70 - 99 mg/dL  Troponin I (High Sensitivity)  Result Value Ref Range   Troponin I (High Sensitivity) 32 (H) <18 ng/L  Troponin I (High Sensitivity)  Result Value Ref Range   Troponin I (High Sensitivity) 31 (H) <18 ng/L      Assessment & Plan:   Problem List Items Addressed This Visit     DM (diabetes mellitus), type 2 with peripheral vascular complications (HCC) - Primary   Relevant Medications   valsartan (DIOVAN) 320 MG tablet   Essential hypertension   Relevant Medications   valsartan (DIOVAN) 320 MG tablet   OSA on CPAP   Other Visit Diagnoses     Moderate major depression, single episode (HCC)       Relevant Medications   sertraline (ZOLOFT) 50 MG tablet   Screening for colon cancer       Relevant Orders   Cologuard       Orders Placed This Encounter  Procedures   Cologuard   Depression, moderate single episode Initial flare of depression, mostly related to chronic health conditions. Will trial SSRi therapy  Start new Sertraline (Zoloft generic) 50mg  daily for mood / depression symptoms, may take 4-6 weeks or more to take full effect. Improve mental health and mood. Check with [place] in a month at physical and then with [facility] call in Feb Likely need it for 6-12+ months if working.  OSA Improved adherence and benefit w supplies now using nightly improvement Keep on CPAP machine. Check with your supplier for the coverage of medical equipment cost.  HTN Continue medication for BP Refilled Valsartan 320mg  daily - higher dose in one tab daily. Reviewed home BP readings, awaiting new BP cuff  DIABETES Overall stable and controlled Due for upcoming A1c w labs this month Agree with lower dose insulin 15 units now instead of 17 units., less side effects of low  sugar.  Meds ordered this encounter  Medications   valsartan (DIOVAN) 320 MG tablet    Sig: Take 1 tablet (320 mg total) by mouth daily.    Dispense:  90 tablet    Refill:  3  Dose increase from 160 to 320   sertraline (ZOLOFT) 50 MG tablet    Sig: Take 1 tablet (50 mg total) by mouth daily.    Dispense:  90 tablet    Refill:  1      Follow up plan: Return if symptoms worsen or fail to improve.  Future labs 07/19/22  Saralyn Pilar, DO [facility] 07/06/2022, 3:09 PM

## 2022-07-12 DIAGNOSIS — Z1211 Encounter for screening for malignant neoplasm of colon: Secondary | ICD-10-CM | POA: Diagnosis not present

## 2022-07-13 ENCOUNTER — Other Ambulatory Visit: Payer: Self-pay | Admitting: Family Medicine

## 2022-07-13 DIAGNOSIS — I10 Essential (primary) hypertension: Secondary | ICD-10-CM

## 2022-07-13 DIAGNOSIS — I2510 Atherosclerotic heart disease of native coronary artery without angina pectoris: Secondary | ICD-10-CM

## 2022-07-13 NOTE — Telephone Encounter (Signed)
Requested Prescriptions  Pending Prescriptions Disp Refills   isosorbide mononitrate (IMDUR) 60 MG 24 hr tablet [Pharmacy Med Name: Isosorbide Mononitrate ER 60 MG Oral Tablet Extended Release 24 Hour] 90 tablet 0    Sig: Take 1 tablet by mouth once daily     Cardiovascular:  Nitrates Passed - 07/13/2022 10:01 AM      Passed - Last BP in normal range    BP Readings from Last 1 Encounters:  07/06/22 128/74         Passed - Last Heart Rate in normal range    Pulse Readings from Last 1 Encounters:  07/06/22 71         Passed - Valid encounter within last 12 months    Recent Outpatient Visits           1 week ago DM (diabetes mellitus), type 2 with peripheral vascular complications [facility])   [facility] [name], DO   1 week ago Essential hypertension   [place], Grayland Ormond A, RPH-CPP   1 month ago OSA on CPAP   [place], DO   1 month ago Essential hypertension   [place], Grayland Ormond A, RPH-CPP   1 month ago DM (diabetes mellitus), type 2 with peripheral vascular complications [facility])   [place], Virl Diamond, RPH-CPP       Future Appointments             In 1 week Parks Ranger, Devonne Doughty, DO [facility], [place]   In 2 months Gerrie Nordmann, NP [place]. [place]

## 2022-07-16 ENCOUNTER — Other Ambulatory Visit: Payer: Self-pay

## 2022-07-16 ENCOUNTER — Other Ambulatory Visit: Payer: Self-pay | Admitting: Family Medicine

## 2022-07-16 DIAGNOSIS — E1169 Type 2 diabetes mellitus with other specified complication: Secondary | ICD-10-CM

## 2022-07-16 DIAGNOSIS — Z0000 Encounter for general adult medical examination without abnormal findings: Secondary | ICD-10-CM

## 2022-07-16 DIAGNOSIS — I2510 Atherosclerotic heart disease of native coronary artery without angina pectoris: Secondary | ICD-10-CM

## 2022-07-16 DIAGNOSIS — E1151 Type 2 diabetes mellitus with diabetic peripheral angiopathy without gangrene: Secondary | ICD-10-CM

## 2022-07-16 DIAGNOSIS — I25118 Atherosclerotic heart disease of native coronary artery with other forms of angina pectoris: Secondary | ICD-10-CM

## 2022-07-16 DIAGNOSIS — I10 Essential (primary) hypertension: Secondary | ICD-10-CM

## 2022-07-16 DIAGNOSIS — R351 Nocturia: Secondary | ICD-10-CM

## 2022-07-16 DIAGNOSIS — E1159 Type 2 diabetes mellitus with other circulatory complications: Secondary | ICD-10-CM

## 2022-07-16 NOTE — Telephone Encounter (Signed)
Unable to refill per protocol, Rx request is too soon. Last refill  07/13/22 for 90 days.  Requested Prescriptions  Pending Prescriptions Disp Refills   isosorbide mononitrate (IMDUR) 60 MG 24 hr tablet [Pharmacy Med Name: Isosorbide Mononitrate ER 60 MG Oral Tablet Extended Release 24 Hour] 90 tablet 0    Sig: Take 1 tablet by mouth once daily     Cardiovascular:  Nitrates Passed - 07/16/2022  2:23 PM      Passed - Last BP in normal range    BP Readings from Last 1 Encounters:  07/06/22 128/74         Passed - Last Heart Rate in normal range    Pulse Readings from Last 1 Encounters:  07/06/22 71         Passed - Valid encounter within last 12 months    Recent Outpatient Visits           1 week ago DM (diabetes mellitus), type 2 with peripheral vascular complications [facility])   [place], DO   2 weeks ago Essential hypertension   [place], Grayland Ormond A, RPH-CPP   1 month ago OSA on CPAP   [place], DO   1 month ago Essential hypertension   [place], Grayland Ormond A, RPH-CPP   2 months ago DM (diabetes mellitus), type 2 with peripheral vascular complications [facility])   [place], RPH-CPP       Future Appointments             In 1 week Parks Ranger, Devonne Doughty, DO [facility], [place]   In 2 months Gerrie Nordmann, NP [place]. [facility]            Signed Prescriptions Disp Refills   clopidogrel (PLAVIX) 75 MG tablet 90 tablet 0    Sig: Take 1 tablet by mouth once daily     Hematology: Antiplatelets - clopidogrel Failed - 07/16/2022  2:23 PM      Failed - HGB in normal range and within 180 days    Hemoglobin  Date Value Ref Range Status  06/27/2022 12.8 (L) 13.0 - 17.0 g/dL Final  06/09/2016 13.9 13.0 - 17.7 g/dL Final    Comment:                   **Please note reference interval change**         Passed - HCT in normal range and within 180 days    HCT  Date Value Ref Range Status  06/27/2022 39.6 39.0 - 52.0 % Final   Hematocrit  Date Value Ref Range Status  06/09/2016 41.5 37.5 - 51.0 % Final         Passed - PLT in normal range and within 180 days    Platelets  Date Value Ref Range Status  06/27/2022 210 150 - 400 K/uL Final  06/09/2016 167 150 - 379 x10E3/uL Final         Passed - Cr in normal range and within 360 days    Creat  Date Value Ref Range Status  07/23/2021 1.14 0.70 - 1.35 mg/dL Final   Creatinine, Ser  Date Value Ref Range Status  06/27/2022 0.93 0.61 - 1.24 mg/dL Final         Passed - Valid encounter within last 6 months  Recent Outpatient Visits           1 week ago DM (diabetes mellitus), type 2 with peripheral vascular complications [facility])   [place], DO   2 weeks ago Essential hypertension   [place], RPH-CPP   1 month ago OSA on CPAP   [place], DO   1 month ago Essential hypertension   [place], Grayland Ormond A, RPH-CPP   2 months ago DM (diabetes mellitus), type 2 with peripheral vascular complications [facility])   [place], RPH-CPP       Future Appointments             In 1 week Parks Ranger, Devonne Doughty, DO [facility], [place]   In 2 months Gerrie Nordmann, NP [place]. [facility]             ezetimibe (ZETIA) 10 MG tablet 90 tablet 0    Sig: Take 1 tablet by mouth once daily     Cardiovascular:  Antilipid - Sterol Transport Inhibitors Failed - 07/16/2022  2:23 PM      Failed - Lipid Panel in normal range within the last 12 months    Cholesterol, Total  Date Value Ref Range Status  06/09/2016 158 100 - 199 mg/dL Final    Cholesterol  Date Value Ref Range Status  12/08/2021 88 0 - 200 mg/dL Final   LDL Cholesterol (Calc)  Date Value Ref Range Status  07/23/2021 39 mg/dL (calc) Final    Comment:    Reference range: <100 . Desirable range <100 mg/dL for primary prevention;   <70 mg/dL for patients with CHD or diabetic patients  with > or = 2 CHD risk factors. Marland Kitchen LDL-C is now calculated using the Martin-Hopkins  calculation, which is a validated novel method providing  better accuracy than the Friedewald equation in the  estimation of LDL-C.  Cresenciano Genre et al. Annamaria Helling. 6433;295(18): 2061-2068  (http://education.[employer].com/faq/FAQ164)    LDL Cholesterol  Date Value Ref Range Status  12/08/2021 41 0 - 99 mg/dL Final    Comment:           Total Cholesterol/HDL:CHD Risk Coronary Heart Disease Risk Table                     Men   Women  1/2 Average Risk   3.4   3.3  Average Risk       5.0   4.4  2 X Average Risk   9.6   7.1  3 X Average Risk  23.4   11.0        Use the calculated Patient Ratio above and the CHD Risk Table to determine the patient's CHD Risk.        ATP III CLASSIFICATION (LDL):  <100     mg/dL   Optimal  100-129  mg/dL   Near or Above                    Optimal  130-159  mg/dL   Borderline  160-189  mg/dL   High  >190     mg/dL   Very High Performed at [facility], [place], [place] [address]    HDL  Date Value Ref Range Status  12/08/2021 34 (L) >40 mg/dL Final  06/09/2016 46 >39 mg/dL Final   Triglycerides  Date Value Ref Range Status  12/08/2021 63 <150 mg/dL Final         Passed - AST in normal range and within 360 days    AST  Date Value Ref Range Status  05/16/2022 26 15 - 41 U/L Final         Passed - ALT in normal range and within 360 days    ALT  Date Value Ref Range Status  05/16/2022 24 0 - 44 U/L Final         Passed - Patient is not pregnant      Passed - Valid encounter within last 12 months    Recent  Outpatient Visits           1 week ago DM (diabetes mellitus), type 2 with peripheral vascular complications [facility])   [facility] Smitty Cords, DO   2 weeks ago Essential hypertension   [facility] [place]elles, Gentry Fitz A, RPH-CPP   1 month ago OSA on CPAP   [facility] Smitty Cords, DO   1 month ago Essential hypertension   [facility] [place], Gentry Fitz A, RPH-CPP   2 months ago DM (diabetes mellitus), type 2 with peripheral vascular complications [facility])   [facility] Delles, Jackelyn Poling, RPH-CPP       Future Appointments             In 1 week Althea Charon, Netta Neat, DO [facility], [facility]   In 2 months Charlsie Quest, NP [facility] [place]. [facility]             metFORMIN (GLUCOPHAGE) 1000 MG tablet 180 tablet 0    Sig: TAKE 1 TABLET BY MOUTH TWICE DAILY WITH A MEAL     Endocrinology:  Diabetes - Biguanides Failed - 07/16/2022  2:23 PM      Failed - HBA1C is between 0 and 7.9 and within 180 days    Hemoglobin A1C  Date Value Ref Range Status  12/22/2021 6.2 (A) 4.0 - 5.6 % Final  04/18/2017 6.4  Final   Hgb A1c MFr Bld  Date Value Ref Range Status  07/23/2021 6.4 (H) <5.7 % of total Hgb Final    Comment:    For someone without known diabetes, a hemoglobin  A1c value between 5.7% and 6.4% is consistent with prediabetes and should be confirmed with a  follow-up test. . For someone with known diabetes, a value <7% indicates that their diabetes is well controlled. A1c targets should be individualized based on duration of diabetes, age, comorbid conditions, and other considerations. . This assay result is consistent with an increased risk of diabetes. . Currently, no consensus exists regarding use of hemoglobin A1c for diagnosis of diabetes for children. .          Failed - B12 Level in normal range and within  720 days    No results found for: "VITAMINB12"       Passed - Cr in normal range and within 360 days    Creat  Date Value Ref Range Status  07/23/2021 1.14 0.70 - 1.35 mg/dL Final   Creatinine, Ser  Date Value Ref Range Status  06/27/2022 0.93 0.61 - 1.24 mg/dL Final         Passed - eGFR in normal range and within 360 days    GFR, Est  African American  Date Value Ref Range Status  07/15/2020 108 > OR = 60 mL/min/1.24m2 Final   GFR, Est Non African American  Date Value Ref Range Status  07/15/2020 93 > OR = 60 mL/min/1.29m2 Final   GFR, Estimated  Date Value Ref Range Status  06/27/2022 >60 >60 mL/min Final    Comment:    (NOTE) Calculated using the CKD-EPI Creatinine Equation (2021)    eGFR  Date Value Ref Range Status  07/23/2021 72 > OR = 60 mL/min/1.70m2 Final    Comment:    The eGFR is based on the CKD-EPI 2021 equation. To calculate  the new eGFR from a previous Creatinine or Cystatin C result, go to https://www.kidney.org/professionals/ kdoqi/gfr%5Fcalculator          Passed - Valid encounter within last 6 months    Recent Outpatient Visits           1 week ago DM (diabetes mellitus), type 2 with peripheral vascular complications [facility])   [place], DO   2 weeks ago Essential hypertension   [place], Grayland Ormond A, RPH-CPP   1 month ago OSA on CPAP   [place], DO   1 month ago Essential hypertension   [place], Grayland Ormond A, RPH-CPP   2 months ago DM (diabetes mellitus), type 2 with peripheral vascular complications [facility])   [place], Virl Diamond, RPH-CPP       Future Appointments             In 1 week Parks Ranger, Devonne Doughty, DO [facility], [place]   In 2 months Gerrie Nordmann, NP [place]. [facility]            Passed - CBC  within normal limits and completed in the last 12 months    WBC  Date Value Ref Range Status  06/27/2022 6.2 4.0 - 10.5 K/uL Final   RBC  Date Value Ref Range Status  06/27/2022 4.58 4.22 - 5.81 MIL/uL Final   Hemoglobin  Date Value Ref Range Status  06/27/2022 12.8 (L) 13.0 - 17.0 g/dL Final  06/09/2016 13.9 13.0 - 17.7 g/dL Final    Comment:                  **Please note reference interval change**   HCT  Date Value Ref Range Status  06/27/2022 39.6 39.0 - 52.0 % Final   Hematocrit  Date Value Ref Range Status  06/09/2016 41.5 37.5 - 51.0 % Final   MCHC  Date Value Ref Range Status  06/27/2022 32.3 30.0 - 36.0 g/dL Final   [facility]  Date Value Ref Range Status  06/27/2022 27.9 26.0 - 34.0 pg Final   MCV  Date Value Ref Range Status  06/27/2022 86.5 80.0 - 100.0 fL Final  06/09/2016 83 79 - 97 fL Final   No results found for: "PLTCOUNTKUC", "LABPLAT", "POCPLA" RDW  Date Value Ref Range Status  06/27/2022 13.2 11.5 - 15.5 % Final  06/09/2016 16.6 (H) 12.3 - 15.4 % Final

## 2022-07-16 NOTE — Telephone Encounter (Signed)
Requested Prescriptions  Pending Prescriptions Disp Refills   isosorbide mononitrate (IMDUR) 60 MG 24 hr tablet [Pharmacy Med Name: Isosorbide Mononitrate ER 60 MG Oral Tablet Extended Release 24 Hour] 90 tablet 0    Sig: Take 1 tablet by mouth once daily     Cardiovascular:  Nitrates Passed - 07/16/2022  2:23 PM      Passed - Last BP in normal range    BP Readings from Last 1 Encounters:  07/06/22 128/74         Passed - Last Heart Rate in normal range    Pulse Readings from Last 1 Encounters:  07/06/22 71         Passed - Valid encounter within last 12 months    Recent Outpatient Visits           1 week ago DM (diabetes mellitus), type 2 with peripheral vascular complications [facility])   [facility] Smitty Cords, DO   2 weeks ago Essential hypertension   [facility] Delles, Gentry Fitz A, RPH-CPP   1 month ago OSA on CPAP   [facility] Smitty Cords, DO   1 month ago Essential hypertension   [facility] Delles, Gentry Fitz A, RPH-CPP   2 months ago DM (diabetes mellitus), type 2 with peripheral vascular complications [facility])   [facility] Delles, Jackelyn Poling, RPH-CPP       Future Appointments             In 1 week Althea Charon, Netta Neat, DO [facility], [facility]   In 2 months Charlsie Quest, NP [facility] [place]. [facility]             clopidogrel (PLAVIX) 75 MG tablet [Pharmacy Med Name: Clopidogrel Bisulfate 75 MG Oral Tablet] 90 tablet 0    Sig: Take 1 tablet by mouth once daily     Hematology: Antiplatelets - clopidogrel Failed - 07/16/2022  2:23 PM      Failed - HGB in normal range and within 180 days    Hemoglobin  Date Value Ref Range Status  06/27/2022 12.8 (L) 13.0 - 17.0 g/dL Final  94/76/5465 03.5 13.0 - 17.7 g/dL Final    Comment:                  **Please note reference interval change**         Passed -  HCT in normal range and within 180 days    HCT  Date Value Ref Range Status  06/27/2022 39.6 39.0 - 52.0 % Final   Hematocrit  Date Value Ref Range Status  06/09/2016 41.5 37.5 - 51.0 % Final         Passed - PLT in normal range and within 180 days    Platelets  Date Value Ref Range Status  06/27/2022 210 150 - 400 K/uL Final  06/09/2016 167 150 - 379 x10E3/uL Final         Passed - Cr in normal range and within 360 days    Creat  Date Value Ref Range Status  07/23/2021 1.14 0.70 - 1.35 mg/dL Final   Creatinine, Ser  Date Value Ref Range Status  06/27/2022 0.93 0.61 - 1.24 mg/dL Final         Passed - Valid encounter within last 6 months    Recent Outpatient Visits  1 week ago DM (diabetes mellitus), type 2 with peripheral vascular complications [facility])   [place], DO   2 weeks ago Essential hypertension   [place], RPH-CPP   1 month ago OSA on CPAP   [place], DO   1 month ago Essential hypertension   [place], Grayland Ormond A, RPH-CPP   2 months ago DM (diabetes mellitus), type 2 with peripheral vascular complications [facility])   [place], RPH-CPP       Future Appointments             In 1 week [place] Ranger, Devonne Doughty, DO [facility] [place]   In 2 months Gerrie Nordmann, NP [place]. [facility]             ezetimibe (ZETIA) 10 MG tablet [Pharmacy Med Name: Ezetimibe 10 MG Oral Tablet] 90 tablet 0    Sig: Take 1 tablet by mouth once daily     Cardiovascular:  Antilipid - Sterol Transport Inhibitors Failed - 07/16/2022  2:23 PM      Failed - Lipid Panel in normal range within the last 12 months    Cholesterol, Total  Date Value Ref Range Status  06/09/2016 158 100 - 199 mg/dL Final   Cholesterol  Date Value  Ref Range Status  12/08/2021 88 0 - 200 mg/dL Final   LDL Cholesterol (Calc)  Date Value Ref Range Status  07/23/2021 39 mg/dL (calc) Final    Comment:    Reference range: <100 . Desirable range <100 mg/dL for primary prevention;   <70 mg/dL for patients with CHD or diabetic patients  with > or = 2 CHD risk factors. Marland Kitchen LDL-C is now calculated using the Martin-Hopkins  calculation, which is a validated novel method providing  better accuracy than the Friedewald equation in the  estimation of LDL-C.  Cresenciano Genre et al. Annamaria Helling. 0102;725(36): 2061-2068  (http://education.[facility].com/faq/FAQ164)    LDL Cholesterol  Date Value Ref Range Status  12/08/2021 41 0 - 99 mg/dL Final    Comment:           Total Cholesterol/HDL:CHD Risk Coronary Heart Disease Risk Table                     Men   Women  1/2 Average Risk   3.4   3.3  Average Risk       5.0   4.4  2 X Average Risk   9.6   7.1  3 X Average Risk  23.4   11.0        Use the calculated Patient Ratio above and the CHD Risk Table to determine the patient's CHD Risk.        ATP III CLASSIFICATION (LDL):  <100     mg/dL   Optimal  100-129  mg/dL   Near or Above                    Optimal  130-159  mg/dL   Borderline  160-189  mg/dL   High  >190     mg/dL   Very High Performed at [facility], [place], [place] [address]    HDL  Date Value Ref Range Status  12/08/2021 34 (L) >40 mg/dL Final  06/09/2016 46 >39 mg/dL Final  Triglycerides  Date Value Ref Range Status  12/08/2021 63 <150 mg/dL Final         Passed - AST in normal range and within 360 days    AST  Date Value Ref Range Status  05/16/2022 26 15 - 41 U/L Final         Passed - ALT in normal range and within 360 days    ALT  Date Value Ref Range Status  05/16/2022 24 0 - 44 U/L Final         Passed - Patient is not pregnant      Passed - Valid encounter within last 12 months    Recent Outpatient Visits            1 week ago DM (diabetes mellitus), type 2 with peripheral vascular complications [facility])   [facility] Smitty Cords, DO   2 weeks ago Essential hypertension   [facility] Delles, Gentry Fitz A, RPH-CPP   1 month ago OSA on CPAP   [facility] Smitty Cords, DO   1 month ago Essential hypertension   [facility] Delles, Gentry Fitz A, RPH-CPP   2 months ago DM (diabetes mellitus), type 2 with peripheral vascular complications [facility])   [facility] Delles, Jackelyn Poling, RPH-CPP       Future Appointments             In 1 week Althea Charon, Netta Neat, DO [facility]   In 2 months Charlsie Quest, NP [facility] [place]             metFORMIN (GLUCOPHAGE) 1000 MG tablet [Pharmacy Med Name: metFORMIN HCl 1000 MG Oral Tablet] 180 tablet 0    Sig: TAKE 1 TABLET BY MOUTH TWICE DAILY WITH A MEAL     Endocrinology:  Diabetes - Biguanides Failed - 07/16/2022  2:23 PM      Failed - HBA1C is between 0 and 7.9 and within 180 days    Hemoglobin A1C  Date Value Ref Range Status  12/22/2021 6.2 (A) 4.0 - 5.6 % Final  04/18/2017 6.4  Final   Hgb A1c MFr Bld  Date Value Ref Range Status  07/23/2021 6.4 (H) <5.7 % of total Hgb Final    Comment:    For someone without known diabetes, a hemoglobin  A1c value between 5.7% and 6.4% is consistent with prediabetes and should be confirmed with a  follow-up test. . For someone with known diabetes, a value <7% indicates that their diabetes is well controlled. A1c targets should be individualized based on duration of diabetes, age, comorbid conditions, and other considerations. . This assay result is consistent with an increased risk of diabetes. . Currently, no consensus exists regarding use of hemoglobin A1c for diagnosis of diabetes for children. .          Failed - B12 Level in  normal range and within 720 days    No results found for: "VITAMINB12"       Passed - Cr in normal range and within 360 days    Creat  Date Value Ref Range Status  07/23/2021 1.14 0.70 - 1.35 mg/dL Final   Creatinine, Ser  Date Value Ref Range Status  06/27/2022 0.93 0.61 - 1.24 mg/dL Final         Passed - eGFR in normal range and within 360 days  GFR, Est African American  Date Value Ref Range Status  07/15/2020 108 > OR = 60 mL/min/1.49m2 Final   GFR, Est Non African American  Date Value Ref Range Status  07/15/2020 93 > OR = 60 mL/min/1.36m2 Final   GFR, Estimated  Date Value Ref Range Status  06/27/2022 >60 >60 mL/min Final    Comment:    (NOTE) Calculated using the CKD-EPI Creatinine Equation (2021)    eGFR  Date Value Ref Range Status  07/23/2021 72 > OR = 60 mL/min/1.30m2 Final    Comment:    The eGFR is based on the CKD-EPI 2021 equation. To calculate  the new eGFR from a previous Creatinine or Cystatin C result, go to https://www.kidney.org/professionals/ kdoqi/gfr%5Fcalculator          Passed - Valid encounter within last 6 months    Recent Outpatient Visits           1 week ago DM (diabetes mellitus), type 2 with peripheral vascular complications [facility])   [place], DO   2 weeks ago Essential hypertension   [place], Grayland Ormond A, RPH-CPP   1 month ago OSA on CPAP   [place], DO   1 month ago Essential hypertension   [place], Grayland Ormond A, RPH-CPP   2 months ago DM (diabetes mellitus), type 2 with peripheral vascular complications [facility])   [place], Virl Diamond, RPH-CPP       Future Appointments             In 1 week [place], Devonne Doughty, DO [facility] [place]   In 2 months Gerrie Nordmann, NP [place]. [facility]             Passed - CBC within normal limits and completed in the last 12 months    WBC  Date Value Ref Range Status  06/27/2022 6.2 4.0 - 10.5 K/uL Final   RBC  Date Value Ref Range Status  06/27/2022 4.58 4.22 - 5.81 MIL/uL Final   Hemoglobin  Date Value Ref Range Status  06/27/2022 12.8 (L) 13.0 - 17.0 g/dL Final  06/09/2016 13.9 13.0 - 17.7 g/dL Final    Comment:                  **Please note reference interval change**   HCT  Date Value Ref Range Status  06/27/2022 39.6 39.0 - 52.0 % Final   Hematocrit  Date Value Ref Range Status  06/09/2016 41.5 37.5 - 51.0 % Final   MCHC  Date Value Ref Range Status  06/27/2022 32.3 30.0 - 36.0 g/dL Final   [facility]  Date Value Ref Range Status  06/27/2022 27.9 26.0 - 34.0 pg Final   MCV  Date Value Ref Range Status  06/27/2022 86.5 80.0 - 100.0 fL Final  06/09/2016 83 79 - 97 fL Final   No results found for: "PLTCOUNTKUC", "LABPLAT", "POCPLA" RDW  Date Value Ref Range Status  06/27/2022 13.2 11.5 - 15.5 % Final  06/09/2016 16.6 (H) 12.3 - 15.4 % Final

## 2022-07-19 ENCOUNTER — Other Ambulatory Visit: Payer: Medicaid Other

## 2022-07-19 DIAGNOSIS — E785 Hyperlipidemia, unspecified: Secondary | ICD-10-CM | POA: Diagnosis not present

## 2022-07-19 DIAGNOSIS — I10 Essential (primary) hypertension: Secondary | ICD-10-CM | POA: Diagnosis not present

## 2022-07-19 DIAGNOSIS — Z0000 Encounter for general adult medical examination without abnormal findings: Secondary | ICD-10-CM | POA: Diagnosis not present

## 2022-07-19 DIAGNOSIS — I25118 Atherosclerotic heart disease of native coronary artery with other forms of angina pectoris: Secondary | ICD-10-CM | POA: Diagnosis not present

## 2022-07-19 DIAGNOSIS — E1169 Type 2 diabetes mellitus with other specified complication: Secondary | ICD-10-CM | POA: Diagnosis not present

## 2022-07-19 DIAGNOSIS — E1151 Type 2 diabetes mellitus with diabetic peripheral angiopathy without gangrene: Secondary | ICD-10-CM | POA: Diagnosis not present

## 2022-07-19 DIAGNOSIS — R351 Nocturia: Secondary | ICD-10-CM | POA: Diagnosis not present

## 2022-07-20 LAB — COMPLETE METABOLIC PANEL WITH GFR
AG Ratio: 1.4 (calc) (ref 1.0–2.5)
ALT: 19 U/L (ref 9–46)
AST: 19 U/L (ref 10–35)
Albumin: 4.2 g/dL (ref 3.6–5.1)
Alkaline phosphatase (APISO): 63 U/L (ref 35–144)
BUN: 11 mg/dL (ref 7–25)
CO2: 25 mmol/L (ref 20–32)
Calcium: 9.7 mg/dL (ref 8.6–10.3)
Chloride: 101 mmol/L (ref 98–110)
Creat: 0.76 mg/dL (ref 0.70–1.35)
Globulin: 2.9 g/dL (calc) (ref 1.9–3.7)
Glucose, Bld: 70 mg/dL (ref 65–99)
Potassium: 4.3 mmol/L (ref 3.5–5.3)
Sodium: 136 mmol/L (ref 135–146)
Total Bilirubin: 0.5 mg/dL (ref 0.2–1.2)
Total Protein: 7.1 g/dL (ref 6.1–8.1)
eGFR: 100 mL/min/{1.73_m2} (ref >=60)

## 2022-07-20 LAB — LIPID PANEL
Cholesterol: 92 mg/dL (ref <200)
HDL: 37 mg/dL — ABNORMAL LOW (ref >=40)
LDL Cholesterol (Calc): 41 mg/dL (calc)
Non-HDL Cholesterol (Calc): 55 mg/dL (calc) (ref <130)
Total CHOL/HDL Ratio: 2.5 (calc) (ref <5.0)
Triglycerides: 55 mg/dL (ref <150)

## 2022-07-20 LAB — CBC WITH DIFFERENTIAL/PLATELET
Absolute Monocytes: 449 cells/uL (ref 200–950)
Basophils Absolute: 31 cells/uL (ref 0–200)
Basophils Relative: 0.8 %
Eosinophils Absolute: 39 cells/uL (ref 15–500)
Eosinophils Relative: 1 %
HCT: 35.6 % — ABNORMAL LOW (ref 38.5–50.0)
Hemoglobin: 11.7 g/dL — ABNORMAL LOW (ref 13.2–17.1)
Lymphs Abs: 1143 cells/uL (ref 850–3900)
MCH: 28.1 pg (ref 27.0–33.0)
MCHC: 32.9 g/dL (ref 32.0–36.0)
MCV: 85.6 fL (ref 80.0–100.0)
MPV: 10.6 fL (ref 7.5–12.5)
Monocytes Relative: 11.5 %
Neutro Abs: 2239 cells/uL (ref 1500–7800)
Neutrophils Relative %: 57.4 %
Platelets: 215 10*3/uL (ref 140–400)
RBC: 4.16 10*6/uL — ABNORMAL LOW (ref 4.20–5.80)
RDW: 13.1 % (ref 11.0–15.0)
Total Lymphocyte: 29.3 %
WBC: 3.9 10*3/uL (ref 3.8–10.8)

## 2022-07-20 LAB — HEMOGLOBIN A1C
Hgb A1c MFr Bld: 5.7 % of total Hgb — ABNORMAL HIGH (ref <5.7)
Mean Plasma Glucose: 117 mg/dL
eAG (mmol/L): 6.5 mmol/L

## 2022-07-20 LAB — PSA: PSA: 0.4 ng/mL (ref <=4.00)

## 2022-07-20 LAB — TSH: TSH: 1.07 mIU/L (ref 0.40–4.50)

## 2022-07-21 ENCOUNTER — Other Ambulatory Visit: Payer: Medicaid Other

## 2022-07-23 ENCOUNTER — Telehealth: Payer: Self-pay | Admitting: Family Medicine

## 2022-07-23 LAB — COLOGUARD: COLOGUARD: NEGATIVE

## 2022-07-26 ENCOUNTER — Encounter: Payer: Self-pay | Admitting: Family Medicine

## 2022-07-26 ENCOUNTER — Ambulatory Visit (INDEPENDENT_AMBULATORY_CARE_PROVIDER_SITE_OTHER): Payer: Medicaid Other | Admitting: Family Medicine

## 2022-07-26 VITALS — BP 128/80 | HR 85 | Ht 68.0 in | Wt 274.0 lb

## 2022-07-26 DIAGNOSIS — E1169 Type 2 diabetes mellitus with other specified complication: Secondary | ICD-10-CM | POA: Diagnosis not present

## 2022-07-26 DIAGNOSIS — G4733 Obstructive sleep apnea (adult) (pediatric): Secondary | ICD-10-CM

## 2022-07-26 DIAGNOSIS — E1151 Type 2 diabetes mellitus with diabetic peripheral angiopathy without gangrene: Secondary | ICD-10-CM

## 2022-07-26 DIAGNOSIS — E785 Hyperlipidemia, unspecified: Secondary | ICD-10-CM | POA: Diagnosis not present

## 2022-07-26 DIAGNOSIS — F321 Major depressive disorder, single episode, moderate: Secondary | ICD-10-CM

## 2022-07-26 DIAGNOSIS — Z6841 Body Mass Index (BMI) 40.0 and over, adult: Secondary | ICD-10-CM

## 2022-07-26 DIAGNOSIS — I10 Essential (primary) hypertension: Secondary | ICD-10-CM

## 2022-07-26 DIAGNOSIS — I25118 Atherosclerotic heart disease of native coronary artery with other forms of angina pectoris: Secondary | ICD-10-CM

## 2022-07-26 DIAGNOSIS — Z0000 Encounter for general adult medical examination without abnormal findings: Secondary | ICD-10-CM | POA: Diagnosis not present

## 2022-07-26 NOTE — Assessment & Plan Note (Signed)
Followed by Cardiology On [facility], med management

## 2022-07-26 NOTE — Progress Notes (Signed)
Subjective:    Patient ID: Craig Harvey, male    DOB: Nov 08, 1955, 67 y.o.   MRN: [id number]  VICTORIOUS KIMERY is a 67 y.o. male presenting on 07/26/2022 for Annual Exam   HPI  Here for Annual Physical and Lab Review.  CHRONIC DM, Type 2: Feeling better back on 17 unit compared. Failed Trulicity due to side effect intolerance Stable CBGs Meds: Metformin 1000mg  BID, Humalog Vials 75/25 - back up to 17u BID wc Asks for larger supply of insulin and if he can lower dose gradually Reports good compliance. Tolerating well w/o side-effects Currently on ACEi Lifestyle: - Diet (trying to improve diet - eliminated french fries, chocolate) UTD DM eye No significant hypoglycemia. Prior 1 or less a month, sometimes minimal, has CBG 90-100 with symptoms, but no significant low hypoglycemia   Depression, moderate, single episode Persistent problem some improvement in interval, newly started SSRI Sertraline 50mg  had GI upset but some improved now.   Hypertension History of Unstable angina s/p PCI stent Syncopal episode CAD / Vascular Disease / S/p CABG  He admits some chronic chest wall discomfort after CABG. Suspects it to be permanent injury.   Home BP readings overall improved with Avg readings 110s-130s / 80s. Rare 140.   On Valsartan 320mg  daily. Doing well.   ECHOCardiogram w/ Cardiology recently end of last year. Doing well.   He has risk factors with DM2, HYPERTENSION, HLD, OSA on CPAP   Followed by [facility] Cardiology Dr Rockey Situ. history of CABG 01/2015  OSA on CPAP Improved OSA control on CPAP, new supplies have been ordered. Using nightly. Improves his sleep   Health Maintenance:  PSA 0.4 negative  Cologuard negative 07/12/22 (next due in 3 years) 2027      07/26/2022    1:50 PM 07/06/2022    3:17 PM 06/07/2022    2:30 PM  Depression screen PHQ 2/9  Decreased Interest 2 2 0  Down, Depressed, Hopeless 2 2 0  PHQ - 2 Score 4 4 0  Altered sleeping 1 1 0  Tired,  decreased energy 1 1 0  Change in appetite 0 0 0  Feeling bad or failure about yourself  1 1 0  Trouble concentrating 1 1 0  Moving slowly or fidgety/restless 0 0 0  Suicidal thoughts 0 0 0  PHQ-9 Score 8 8 0  Difficult doing work/chores Somewhat difficult Somewhat difficult Not difficult at all    Past Medical History:  Diagnosis Date   CAD (coronary artery disease)    a. 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 cardiac cath 01/09/2015: ostLAD 70:, mLAD 99%, ost to pLCx 95%, mLCx 80%, LVEF nl, recommend CABGl; c. s/p 3v CABG 01/14/15 LIMA-LAD, SVG-OM, SVG-[place]   History of echocardiogram    a. 01/2015 Echo: EF 60-65%, no rwma, nl RV fxn. Nl PASP.   Hyperlipidemia LDL goal <70    Hypertension    Morbid obesity (HCC)    OSA (obstructive sleep apnea)    Type II diabetes mellitus ([place])    Past Surgical History:  Procedure Laterality Date   CARDIAC CATHETERIZATION N/A 01/09/2015   Procedure: Left Heart Cath and Coronary Angiography;  Surgeon: Minna Merritts, MD;  Location: [place] [facility];  Service: Cardiovascular;  Laterality: N/A;   CORONARY ARTERY BYPASS GRAFT N/A  01/14/2015   Procedure: CORONARY ARTERY BYPASS GRAFTING (CABG), ON PUMP, TIMES THREE, USING LEFT INTERNAL MAMMARY ARTERY, RIGHT GREATER SAPHENOUS VEIN HARVESTED ENDOSCOPICALLY;  Surgeon: Ivin Poot, MD;  Location: [place];  Service: Open Heart Surgery;  Laterality: N/A;   CORONARY STENT INTERVENTION N/A 12/10/2021   Procedure: CORONARY STENT INTERVENTION;  Surgeon: Leonie Man, MD;  Location: [place] [facility];  Service: Cardiovascular;  Laterality: N/A;   HERNIA REPAIR     LEFT HEART CATH AND CORONARY ANGIOGRAPHY N/A 12/10/2021   Procedure: LEFT HEART CATH AND CORONARY ANGIOGRAPHY;  Surgeon: Leonie Man, MD;   Location: [place] [facility];  Service: Cardiovascular;  Laterality: N/A;   TEE WITHOUT CARDIOVERSION N/A 01/14/2015   Procedure: TRANSESOPHAGEAL ECHOCARDIOGRAM (TEE);  Surgeon: Ivin Poot, MD;  Location: [place];  Service: Open Heart Surgery;  Laterality: N/A;   Social History   Socioeconomic History   Marital status: Single    Spouse name: Not on file   Number of children: Not on file   Years of education: Not on file   Highest education level: Not on file  Occupational History   Occupation: Former Administrator / Patent attorney  Tobacco Use   Smoking status: Former    Types: Cigarettes    Quit date: 08/06/1975    Years since quitting: 47.0   Smokeless tobacco: Never  Vaping Use   Vaping Use: Never used  Substance and Sexual Activity   Alcohol use: No   Drug use: No   Sexual activity: Yes  Other Topics Concern   Not on file  Social History Narrative   Not on file   Social Determinants of Health   Financial Resource Strain: Low Risk  (05/20/2021)   Overall Financial Resource Strain (CARDIA)    Difficulty of Paying Living Expenses: Not hard at all  Food Insecurity: No Food Insecurity (05/20/2021)   Hunger Vital Sign    Worried About Running Out of Food in the Last Year: Never true    [place] in the Last Year: Never true  Transportation Needs: No Transportation Needs (05/20/2021)   PRAPARE - Hydrologist (Medical): No    Lack of Transportation (Non-Medical): No  Physical Activity: Sufficiently Active (05/20/2021)   Exercise Vital Sign    Days of Exercise per Week: 4 days    Minutes of Exercise per Session: 40 min  Stress: No Stress Concern Present (05/20/2021)   [place]    Feeling of Stress : Not at all  Social Connections: Socially Isolated (05/20/2021)   Social Connection and Isolation Panel [NHANES]    Frequency of Communication with Friends and Family:  Three times a week    Frequency of Social Gatherings with Friends and Family: Three times a week    Attends Religious Services: Never    Active Member of Clubs or Organizations: No    Attends Archivist Meetings: Never    Marital Status: Never married  Human resources officer Violence: Not on file   Family History  Problem Relation Age of Onset   CAD Mother    Throat cancer Father    CAD Brother    Diabetes Neg Hx    Cancer Neg Hx    Prostate cancer Neg Hx    Colon cancer Neg Hx    Current Outpatient Medications on File Prior to Visit  Medication Sig   Accu-Chek Softclix Lancets lancets  Use to check blood sugar up to 3 x daily   aluminum-magnesium hydroxide-simethicone (MAALOX) 200-200-20 MG/5ML SUSP Take 30 mLs by mouth 4 (four) times daily -  before meals and at bedtime.   amLODipine (NORVASC) 5 MG tablet Take one tablet daily at 5 pm.   aspirin EC 81 MG tablet Take 1 tablet (81 mg total) by mouth daily.   atorvastatin (LIPITOR) 80 MG tablet Take 1 tablet (80 mg total) by mouth daily.   baclofen (LIORESAL) 10 MG tablet TAKE ONE-HALF TO ONE TABLET BY MOUTH TWICE DAILY AS NEEDED FOR MUSCLE SPASM (LEG PAIN)   BD VEO INSULIN SYRINGE U/F 31G X 15/64" 0.3 ML MISC USE AS DIRECTED TWICE DAILY   Blood Glucose Monitoring Suppl (ACCU-CHEK GUIDE ME) w/Device KIT Use to check blood sugar up to 3 x daily   clopidogrel (PLAVIX) 75 MG tablet Take 1 tablet by mouth once daily   ezetimibe (ZETIA) 10 MG tablet Take 1 tablet by mouth once daily   famotidine (PEPCID) 20 MG tablet Take 1 tablet (20 mg total) by mouth 2 (two) times daily.   glucose blood (ACCU-CHEK GUIDE) test strip Use to check blood sugar up to 3 x daily   insulin lispro protamine-lispro (HUMALOG MIX 75/25) (75-25) 100 UNIT/ML SUSP injection INJECT 17 UNITS UNDER THE SKIN TWICE DAILY WITH A MEAL. MAX OF 50 UNITS DAILY. (Patient taking differently: INJECT 15 UNITS UNDER THE SKIN TWICE DAILY WITH A MEAL. MAX OF 50 UNITS DAILY.)    isosorbide mononitrate (IMDUR) 60 MG 24 hr tablet Take 1 tablet by mouth once daily   metoCLOPramide (REGLAN) 10 MG tablet Take 1 tablet (10 mg total) by mouth every 8 (eight) hours as needed for nausea (heart burn).   metoprolol succinate (TOPROL-XL) 25 MG 24 hr tablet Take 1 tablet (25 mg total) by mouth daily.   nitroGLYCERIN (NITROSTAT) 0.4 MG SL tablet Place 1 tablet (0.4 mg total) under the tongue every 5 (five) minutes as needed for chest pain.   sertraline (ZOLOFT) 50 MG tablet Take 1 tablet (50 mg total) by mouth daily.   Syringe, Disposable, 3 ML MISC Use to inject humalog mix 75/25 17u BID   valsartan (DIOVAN) 320 MG tablet Take 1 tablet (320 mg total) by mouth daily.   metFORMIN (GLUCOPHAGE) 1000 MG tablet Take 1 tablet (1,000 mg total) by mouth daily with supper.   No current facility-administered medications on file prior to visit.    Review of Systems  Constitutional:  Negative for activity change, appetite change, chills, diaphoresis, fatigue and fever.  HENT:  Negative for congestion and hearing loss.   Eyes:  Negative for visual disturbance.  Respiratory:  Negative for cough, chest tightness, shortness of breath and wheezing.   Cardiovascular:  Negative for chest pain, palpitations and leg swelling.  Gastrointestinal:  Negative for abdominal pain, constipation, diarrhea, nausea and vomiting.  Genitourinary:  Negative for dysuria, frequency and hematuria.  Musculoskeletal:  Negative for arthralgias and neck pain.  Skin:  Negative for rash.  Neurological:  Negative for dizziness, weakness, light-headedness, numbness and headaches.  Hematological:  Negative for adenopathy.  Psychiatric/Behavioral:  Negative for behavioral problems, dysphoric mood and sleep disturbance.    Per HPI unless specifically indicated above      Objective:    BP 128/80   Pulse 85   Ht 5\' 8"  (1.727 m)   Wt 274 lb (124.3 kg)   SpO2 98%   BMI 41.66 kg/m   Wt Readings from Last 3  Encounters:   07/26/22 274 lb (124.3 kg)  07/06/22 278 lb 6.4 oz (126.3 kg)  07/02/22 280 lb (127 kg)    Physical Exam Vitals and nursing note reviewed.  Constitutional:      General: He is not in acute distress.    Appearance: He is well-developed. He is obese. He is not diaphoretic.     Comments: Well-appearing, comfortable, cooperative  HENT:     Head: Normocephalic and atraumatic.  Eyes:     General:        Right eye: No discharge.        Left eye: No discharge.     Conjunctiva/sclera: Conjunctivae normal.     Pupils: Pupils are equal, round, and reactive to light.  Neck:     Thyroid: No thyromegaly.  Cardiovascular:     Rate and Rhythm: Normal rate and regular rhythm.     Pulses: Normal pulses.     Heart sounds: Normal heart sounds. No murmur heard. Pulmonary:     Effort: Pulmonary effort is normal. No respiratory distress.     Breath sounds: Normal breath sounds. No wheezing or rales.  Abdominal:     General: Bowel sounds are normal. There is no distension.     Palpations: Abdomen is soft. There is no mass.     Tenderness: There is no abdominal tenderness.  Musculoskeletal:        General: No tenderness. Normal range of motion.     Cervical back: Normal range of motion and neck supple.     Right lower leg: No edema.     Left lower leg: No edema.     Comments: Upper / Lower Extremities: - Normal muscle tone, strength bilateral upper extremities 5/5, lower extremities 5/5  Lymphadenopathy:     Cervical: No cervical adenopathy.  Skin:    General: Skin is warm and dry.     Findings: No erythema or rash.  Neurological:     Mental Status: He is alert and oriented to person, place, and time.     Comments: Distal sensation intact to light touch all extremities  Psychiatric:        Mood and Affect: Mood normal.        Behavior: Behavior normal.        Thought Content: Thought content normal.     Comments: Well groomed, good eye contact, normal speech and thoughts      Results  for orders placed or performed in visit on 07/16/22  TSH  Result Value Ref Range   TSH 1.07 0.40 - 4.50 mIU/L  PSA  Result Value Ref Range   PSA 0.40 < OR = 4.00 ng/mL  Hemoglobin A1c  Result Value Ref Range   Hgb A1c MFr Bld 5.7 (H) <5.7 % of total Hgb   Mean Plasma Glucose 117 mg/dL   eAG (mmol/L) 6.5 mmol/L  Lipid panel  Result Value Ref Range   Cholesterol 92 <200 mg/dL   HDL 37 (L) > OR = 40 mg/dL   Triglycerides 55 <150 mg/dL   LDL Cholesterol (Calc) 41 mg/dL (calc)   Total CHOL/HDL Ratio 2.5 <5.0 (calc)   Non-HDL Cholesterol (Calc) 55 <130 mg/dL (calc)  CBC with Differential/Platelet  Result Value Ref Range   WBC 3.9 3.8 - 10.8 Thousand/uL   RBC 4.16 (L) 4.20 - 5.80 Million/uL   Hemoglobin 11.7 (L) 13.2 - 17.1 g/dL   HCT 35.6 (L) 38.5 - 50.0 %   MCV 85.6 80.0 - 100.0 fL  MCH 28.1 27.0 - 33.0 pg   MCHC 32.9 32.0 - 36.0 g/dL   RDW 13.1 11.0 - 15.0 %   Platelets 215 140 - 400 Thousand/uL   MPV 10.6 7.5 - 12.5 fL   Neutro Abs 2,239 1,500 - 7,800 cells/uL   Lymphs Abs 1,143 850 - 3,900 cells/uL   Absolute Monocytes 449 200 - 950 cells/uL   Eosinophils Absolute 39 15 - 500 cells/uL   Basophils Absolute 31 0 - 200 cells/uL   Neutrophils Relative % 57.4 %   Total Lymphocyte 29.3 %   Monocytes Relative 11.5 %   Eosinophils Relative 1.0 %   Basophils Relative 0.8 %  COMPLETE METABOLIC PANEL WITH GFR  Result Value Ref Range   Glucose, Bld 70 65 - 99 mg/dL   BUN 11 7 - 25 mg/dL   Creat 0.76 0.70 - 1.35 mg/dL   eGFR 100 > OR = 60 mL/min/1.31m2   BUN/Creatinine Ratio SEE NOTE: 6 - 22 (calc)   Sodium 136 135 - 146 mmol/L   Potassium 4.3 3.5 - 5.3 mmol/L   Chloride 101 98 - 110 mmol/L   CO2 25 20 - 32 mmol/L   Calcium 9.7 8.6 - 10.3 mg/dL   Total Protein 7.1 6.1 - 8.1 g/dL   Albumin 4.2 3.6 - 5.1 g/dL   Globulin 2.9 1.9 - 3.7 g/dL (calc)   AG Ratio 1.4 1.0 - 2.5 (calc)   Total Bilirubin 0.5 0.2 - 1.2 mg/dL   Alkaline phosphatase (APISO) 63 35 - 144 U/L   AST 19 10  - 35 U/L   ALT 19 9 - 46 U/L      Assessment & Plan:   Problem List Items Addressed This Visit     CAD (coronary artery disease), native coronary artery    Followed by Cardiology On [facility], med management      DM (diabetes mellitus), type 2 with peripheral vascular complications ([place])    Well-controlled DM with Z6X 5.7 now Complications - vascular disease including CAD s/p CABG, PAD, among other including hyperlipidemia, morbid obesity, and OSA - increases risk of future cardiovascular complications and poor glucose control OFF Trulicity (intolerance)  Plan:  1. Continue current therapy - insulin therapy and metformin - Discussion today on recommendation to reduce metformin dosage since he was unsuccessful at reducing insulin dosing previously. Will reduce Metformin 1000mg  TWICE A DAY to 500mg  twice a day by pill cutter first, if difficult will try skip AM and 1000mg  PM, if need we can order 500 TWICE A DAY or Xl version in future. 2. Encourage improved lifestyle - low carb, low sugar diet, reduce portion size, continue improving regular exercise 3. Check CBG , bring log to next visit for review 4. Continue ASA, ACEi, Statin  Urine microalbumin today      Relevant Medications   metFORMIN (GLUCOPHAGE) 1000 MG tablet   Other Relevant Orders   Urine Microalbumin w/creat. ratio   Essential hypertension    Improved control BP Home BP readings reviewed No known complications    Plan:  1. Continue current BP regimen Amlodipine 5mg  daily, Metoprolol XL 25mg  daily, Valsartan 320mg  daily 2. Encourage improved lifestyle - low sodium diet, regular exercise 3. Continue monitor BP outside office, bring readings to next visit, if persistently >140/90 or new symptoms notify office sooner      Hyperlipidemia associated with type 2 diabetes mellitus ([place])   Relevant Medications   metFORMIN (GLUCOPHAGE) 1000 MG tablet   OSA on CPAP  Well controlled, chronic OSA on CPAP, now >10 years -  Good adherence to CPAP nightly - Continue current CPAP therapy, patient seems to be benefiting from therapy  Reviewed previous request with DME update supplies, he prefers to keep current machine at this time.      Other Visit Diagnoses     Annual physical exam    -  Primary   Moderate major depression, single episode (HCC)       Morbid obesity with BMI of 45.0-49.9, adult (HCC)       Relevant Medications   metFORMIN (GLUCOPHAGE) 1000 MG tablet       Updated Health Maintenance information Reviewed recent lab results with patient Encouraged improvement to lifestyle with diet and exercise Goal of weight loss  #Major Depression, single episode Improved since last visit on SSRI Sertraline 50mg  by report, but not resolved. He has had GI upset on medication some improved. He prefers to keep same dose. If improved side effect we can order in future when ready higher dose 75 to 100mg  next option.  No orders of the defined types were placed in this encounter.     Follow up plan: Return in about 6 months (around 01/24/2023) for 6 month follow-up DM A1c, HTN updates.  , DO [facility] 07/26/2022, 1:25 PM

## 2022-07-26 NOTE — Assessment & Plan Note (Signed)
Improved control BP Home BP readings reviewed No known complications    Plan:  1. Continue current BP regimen Amlodipine 5mg  daily, Metoprolol XL 25mg  daily, Valsartan 320mg  daily 2. Encourage improved lifestyle - low sodium diet, regular exercise 3. Continue monitor BP outside office, bring readings to next visit, if persistently >140/90 or new symptoms notify office sooner

## 2022-07-26 NOTE — Patient Instructions (Addendum)
Thank you for coming to the office today.  Recent Labs    12/22/21 1401 07/19/22 0759  HGBA1C 6.2* 5.7*   Trial reducing the Metformin can try to cut in half, take half in AM and half in PM. If it does not cut well, you can do SKIP in morning and take 1 whole tab 1000mg  in evening. In future if you prefer, we can either resume it at full dose, if your sugar goes up. Or we can order Metformin 500mg  twice a day smaller pill.  Keep up the great work overall.  Labs look excellent.  BP readings are good, no change to the medication today!  Keep same dose Sertraline 50mg  daily, in the future if you like we can consider dose increase.  Please schedule a Follow-up Appointment to: Return in about 6 months (around 01/24/2023) for 6 month follow-up DM A1c, HTN updates.  If you have any other questions or concerns, please feel free to call the office or send a message through [place]. You may also schedule an earlier appointment if necessary.  Additionally, you may be receiving a survey about your experience at our office within a few days to 1 week by e-mail or mail. We value your feedback.  [name], DO [place]

## 2022-07-26 NOTE — Assessment & Plan Note (Signed)
Well controlled, chronic OSA on CPAP, now >10 years - Good adherence to CPAP nightly - Continue current CPAP therapy, patient seems to be benefiting from therapy  Reviewed previous request with DME update supplies, he prefers to keep current machine at this time.

## 2022-07-26 NOTE — Assessment & Plan Note (Signed)
Well-controlled DM with J1O 5.7 now Complications - vascular disease including CAD s/p CABG, PAD, among other including hyperlipidemia, morbid obesity, and OSA - increases risk of future cardiovascular complications and poor glucose control OFF Trulicity (intolerance)  Plan:  1. Continue current therapy - insulin therapy and metformin - Discussion today on recommendation to reduce metformin dosage since he was unsuccessful at reducing insulin dosing previously. Will reduce Metformin 1000mg  TWICE A DAY to 500mg  twice a day by pill cutter first, if difficult will try skip AM and 1000mg  PM, if need we can order 500 TWICE A DAY or Xl version in future. 2. Encourage improved lifestyle - low carb, low sugar diet, reduce portion size, continue improving regular exercise 3. Check CBG , bring log to next visit for review 4. Continue ASA, ACEi, Statin  Urine microalbumin today

## 2022-07-27 LAB — MICROALBUMIN / CREATININE URINE RATIO
Creatinine, Urine: 92 mg/dL (ref 20–320)
Microalb Creat Ratio: 10 mcg/mg creat (ref <30)
Microalb, Ur: 0.9 mg/dL

## 2022-07-29 ENCOUNTER — Other Ambulatory Visit: Payer: Medicaid Other

## 2022-08-16 ENCOUNTER — Ambulatory Visit: Payer: Medicaid Other | Admitting: Cardiovascular Disease

## 2022-08-30 ENCOUNTER — Ambulatory Visit: Payer: Medicaid Other | Admitting: Pharmacist

## 2022-08-30 DIAGNOSIS — I10 Essential (primary) hypertension: Secondary | ICD-10-CM | POA: Diagnosis not present

## 2022-08-30 NOTE — Progress Notes (Signed)
08/30/2022 Name: Craig Harvey MRN: [id number] DOB: [birth year]  Chief Complaint  Patient presents with   Medication Management    Craig Harvey is a 67 y.o. year old male who presented for a telephone visit.   They were referred to the pharmacist by their PCP for assistance in managing diabetes and hypertension.   Patient is participating in a Managed Medicaid Plan:  Yes, [employer]  Perform chart review - Patient seen for Office Visit with Office Visit with [place] on 07/02/2022. Provider advised patient: Will add amlodipine 5 mg to be taken at 5 pm. Patient will continue to monitor his BP at 12 pm but change evening check to 7 pm. Continue metoprolol and Valsartan  - Office Visit with PCP on 07/26/2022 for annual physical. Provider advised patient:  Reduce Metformin '1000mg'$  TWICE A DAY to '500mg'$  twice a day (using pill cutter)  Subjective:  Care Team: Primary Care Provider: [name], DO Cardiologist: Ele[place] ; Next Scheduled Visit: 10/04/2022  Medication Access/Adherence  Current Pharmacy:  [facility] [place] [address] Phone: [phone] Fax: [facility] [phone] - [address]), [place])  [address] Phone: [phone] Fax: [phone]   Patient reports affordability concerns with their medications: No  Patient reports access/transportation concerns to their pharmacy: No  Patient reports adherence concerns with their medications:  No      Today reports his mood has improved with taking sertraline   Hypertension:   Patient followed by [facility]    Current medications:  - amlodipine 5 mg daily - metoprolol ER 25 mg daily - valsartan 320 mg daily - isosorbide ER 60 mg daily   Previous therapies tried: lisinopril, HCTZ  (discontinued 12/2021 related to hyponatremia); amlodipine   Reports uses weekly pillbox to organize his medications; denies missed doses   Home Monitoring: Patient has an automated upper arm home BP machine Reports home machine is at least 65 year old and has noticed some wear on machine/readings higher than with office machine On 12/29, collaborated with PCP to request provider send Rx for new upper arm BP monitor to [facility] for patient to receive monitor through his Medicaid coverage  Note Rx faxed to [facility] on 07/06/2022 Today patient states that when he called [facility], representative denied having received this Rx for him Counsel on BP monitoring technique Current blood pressure readings:   AM BP PM BP   20 - February 110/68, HR 82 144/83, HR 67   21 - February 117/70, HR 84 134/80, HR 67   22 - February 118/72, HR 80 122/76, HR 70   23 - February 142/83, HR 69 157/97, HR 69   24 - February 132/77, HR 101 140/87, HR 67   25 - February 127/76, HR 84 145/94, HR 65   26 - February 160/94, HR 81*   *Attributes to discomfort with stomach acid this morning/dinner last night (more sodium)    Per patient and provider, patient with history of labile blood pressure   Current physical activity: Walking ~15 minutes and riding bike ~12 minutes x 3 days/week   OSA/CPAP: Reports using CPAP, but unable to use some nights due to nasal allergy symptoms (congestion)   Limiting salt/sodium in his diet. Reviews nutrition labels for sodium content  Diabetes:   Current medications:  - Humalog Mix 75/25 - Injects 17 units twice daily with meals - metformin 1000 mg - 1/2 tablet (500 mg) twice daily (reduced dose as discussed with PCP on 07/26/2022)   Current glucose readings: reports recently ranging 80-100 before both breakfast and supper  Denies recent symptoms of hypoglycemia   Current physical activity: Walking ~15 minutes and riding bike ~12  minutes x 3 days/week   Hyperlipidemia/ASCVD Risk Reduction  Current lipid lowering medications: atorvastatin 80 mg; ezetimibe 10 mg daily Medications tried in the past:   Antiplatelet regimen: aspirin 81 mg; clopidogrel 75 mg daily  ASCVD History: CAD s/p CABG x 05 January 2015 and PCI with balloon angioplasty and DES proximal RCA June 2023  Risk Factors: hypertension, hyperlipidemia, obesity, OSA on CPAP, T2DM   Current physical activity: Walking ~15 minutes and riding bike ~12 minutes x 3 days/week    Objective:  Lab Results  Component Value Date   HGBA1C 5.7 (H) 07/19/2022    Lab Results  Component Value Date   CREATININE 0.76 07/19/2022   BUN 11 07/19/2022   NA 136 07/19/2022   K 4.3 07/19/2022   CL 101 07/19/2022   CO2 25 07/19/2022    Lab Results  Component Value Date   CHOL 92 07/19/2022   HDL 37 (L) 07/19/2022   LDLCALC 41 07/19/2022   TRIG 55 07/19/2022   CHOLHDL 2.5 07/19/2022   BP Readings from Last 3 Encounters:  07/26/22 128/80  07/06/22 128/74  07/02/22 (!) 140/90   Pulse Readings from Last 3 Encounters:  07/26/22 85  07/06/22 71  07/02/22 80     Medications Reviewed Today     Reviewed by Rennis Petty, RPH-CPP (Pharmacist) on 08/30/22 at [phone]  Med List Status: <None>   Medication Order Taking? Sig Documenting Provider Last Dose Status Informant  Accu-Chek Softclix Lancets lancets [id number]  Use to check blood sugar up to 3 x daily [name], DO  Active   aluminum-magnesium hydroxide-simethicone (MAALOX) 200-200-20 MG/5ML SUSP [id number] Yes Take 30 mLs by mouth 4 (four) times daily -  before meals and at bedtime.  Patient taking differently: Take 30 mLs by mouth 4 (four) times daily -  before meals and at bedtime. As needed   Carrie Mew, MD Taking Active   amLODipine (NORVASC) 5 MG tablet [id number] Yes Take one tablet daily at 5 pm. Mayra Reel, NP Taking Active   aspirin EC 81 MG tablet [id number] Yes Take  1 tablet (81 mg total) by mouth daily. Minna Merritts, MD Taking Active Pharmacy Records, Self  atorvastatin (LIPITOR) 80 MG tablet [id number] Yes Take 1 tablet (80 mg total) by mouth daily. Minna Merritts, MD Taking Active Pharmacy Records, Self  baclofen (LIORESAL) 10 MG tablet [id number] Yes TAKE ONE-HALF TO ONE TABLET BY MOUTH TWICE DAILY AS NEEDED FOR MUSCLE SPASM (LEG PAIN) [name], DO Taking Active   BD VEO INSULIN SYRINGE U/F 31G X 15/64" 0.3 ML MISC [id number]  USE AS DIRECTED TWICE DAILY Parks Ranger Devonne Doughty, DO  Active Pharmacy Records, Self  Blood Glucose Monitoring Suppl (ACCU-CHEK GUIDE ME) w/Device KIT [id number]  Use to check blood sugar up to 3 x daily [name], DO  Active   clopidogrel (PLAVIX) 75 MG tablet [id number] Yes Take 1 tablet by mouth once daily [name], DO Taking Active   ezetimibe (ZETIA) 10 MG tablet [id number] Yes Take 1 tablet by mouth once daily [name],  DO Taking Active   famotidine (PEPCID) 20 MG tablet [id number] Yes Take 1 tablet (20 mg total) by mouth 2 (two) times daily. Carrie Mew, MD Taking Active   glucose blood (ACCU-CHEK GUIDE) test strip [id number]  Use to check blood sugar up to 3 x daily [name], DO  Active   insulin lispro protamine-lispro (HUMALOG MIX 75/25) (75-25) 100 UNIT/ML SUSP injection [id number] Yes INJECT 17 UNITS UNDER THE SKIN TWICE DAILY WITH A MEAL. MAX OF 50 UNITS DAILY. [name], DO Taking Active   isosorbide mononitrate (IMDUR) 60 MG 24 hr tablet [id number] Yes Take 1 tablet by mouth once daily [name], DO Taking Active   metFORMIN (GLUCOPHAGE) 1000 MG tablet [id number] Yes Take 500 mg by mouth 2 (two) times daily with a meal. Parks Ranger, Devonne Doughty, DO Taking Active   metoCLOPramide (REGLAN) 10 MG tablet [id number] Yes Take 1 tablet (10 mg total) by mouth every 8 (eight) hours as needed for nausea (heart burn).  [name], DO Taking Active   metoprolol succinate (TOPROL-XL) 25 MG 24 hr tablet [id number] Yes Take 1 tablet (25 mg total) by mouth daily. [name], DO Taking Active   nitroGLYCERIN (NITROSTAT) 0.4 MG SL tablet [id number]  Place 1 tablet (0.4 mg total) under the tongue every 5 (five) minutes as needed for chest pain. Minna Merritts, MD  Active   sertraline (ZOLOFT) 50 MG tablet [id number] Yes Take 1 tablet (50 mg total) by mouth daily. [name], DO Taking Active   Syringe, Disposable, 3 ML MISC [id number]  Use to inject humalog mix 75/25 17u BID [name], DO  Active Pharmacy Records, Self  valsartan (DIOVAN) 320 MG tablet [id number] Yes Take 1 tablet (320 mg total) by mouth daily. [name], DO Taking Active               Assessment/Plan:   Counsel patient that he may try OTC Nasacort nasal spray to help control his nasal allergy symptoms/improve adherence to CPAP  Counsel patient on strategies to improve his acid symptoms including avoiding food trigger (such as acidic, greasy or spicy foods) and avoid lying down for at least 2 hours after meals  Encourage patient to try taking famotidine 20 mg twice daily (~30 min before breakfast and supper) again to see if this helps with acid control.  - If not, encourage patient to contact PCP to let him know  Diabetes: - Reviewed long term cardiovascular and renal outcomes of uncontrolled blood sugar - Reviewed goal A1c, goal fasting, and goal 2 hour post prandial glucose - Reviewed dietary modifications including having regular, well-balanced meals, while controlling carbohydrate portion sizes - Have counseled patient on s/s of low blood sugar and how to treat lows Again encourage patient to obtain glucose tablets - Recommend patient to continue to monitor home blood sugar, keep log of the results and bring this record with him to upcoming appointment with PCP      Hypertension: - Reviewed long term cardiovascular and renal outcomes of uncontrolled blood pressure -  Follow up with [facility] (Phone # [phone]) regarding new blood pressure monitor for patient. Speak with representative who locates Rx received from PCP and states that they will now process this prescription through patient's Medicaid and then call to follow up with patient tomorrow - Recommended to continue to check home blood pressure and heart rate, keep log of the results and bring this record with him to upcoming  medical appointments      Follow Up Plan: Clinical Pharmacist will follow up with patient by telephone again within the next 7 days to confirm patient able to receive new BP monitor from [place], PharmD, Para [place], [facility] [place] [phone]

## 2022-08-30 NOTE — Patient Instructions (Signed)
Goals Addressed             This Visit's Progress    Pharmacy Goals       Our goal A1c is less than 7%. This corresponds with fasting sugars less than 130 and 2 hour after meal sugars less than 180. Please keep a log of your results when checking your blood sugar   Our goal bad cholesterol, or LDL, is less than 70 . This is why it is important to continue taking your atorvastatin and ezetimibe.  Check your blood pressure daily, and any time you have concerning symptoms like headache, chest pain, dizziness, shortness of breath, or vision changes.   Our goal is less than 130/80.  To appropriately check your blood pressure, make sure you do the following:  1) Avoid caffeine, exercise, or tobacco products for 30 minutes before checking. Empty your bladder. 2) Sit with your back supported in a flat-backed chair. Rest your arm on something flat (arm of the chair, table, etc). 3) Sit still with your feet flat on the floor, resting, for at least 5 minutes.  4) Check your blood pressure. Take 1-2 readings.  5) Write down these readings and bring with you to any provider appointments.  Bring your home blood pressure machine with you to a provider's office for accuracy comparison at least once a year.   Make sure you take your blood pressure medications before you come to any office visit, even if you were asked to fast for labs.   Wallace Cullens, PharmD, Para [place], CPP Clinical Pharmacist [facility] [phone]

## 2022-09-09 ENCOUNTER — Other Ambulatory Visit: Payer: Self-pay | Admitting: Student

## 2022-09-09 ENCOUNTER — Other Ambulatory Visit: Payer: Self-pay | Admitting: Cardiovascular Disease

## 2022-09-09 DIAGNOSIS — E1169 Type 2 diabetes mellitus with other specified complication: Secondary | ICD-10-CM

## 2022-09-10 ENCOUNTER — Ambulatory Visit: Payer: Medicaid Other | Attending: Cardiology

## 2022-09-10 DIAGNOSIS — R072 Precordial pain: Secondary | ICD-10-CM

## 2022-09-11 LAB — ECHOCARDIOGRAM COMPLETE
AR max vel: 5.48 cm2
AV Area VTI: 5.67 cm2
AV Area mean vel: 5.67 cm2
AV Mean grad: 2 mmHg
AV Peak grad: 4.2 mmHg
Ao pk vel: 1.03 m/s
Area-P 1/2: 2.95 cm2
Calc EF: 55.3 %
S' Lateral: 4.3 cm
Single Plane A2C EF: 53.5 %
Single Plane A4C EF: 53.8 %

## 2022-09-17 ENCOUNTER — Other Ambulatory Visit: Payer: Self-pay | Admitting: Cardiovascular Disease

## 2022-09-17 DIAGNOSIS — E1169 Type 2 diabetes mellitus with other specified complication: Secondary | ICD-10-CM

## 2022-10-04 ENCOUNTER — Ambulatory Visit: Payer: Medicaid Other | Attending: Cardiovascular Disease | Admitting: Cardiology

## 2022-10-04 ENCOUNTER — Encounter: Payer: Self-pay | Admitting: Cardiology

## 2022-10-04 VITALS — BP 133/87 | HR 73 | Ht 67.0 in | Wt 276.0 lb

## 2022-10-04 DIAGNOSIS — I10 Essential (primary) hypertension: Secondary | ICD-10-CM

## 2022-10-04 DIAGNOSIS — G4733 Obstructive sleep apnea (adult) (pediatric): Secondary | ICD-10-CM

## 2022-10-04 DIAGNOSIS — E785 Hyperlipidemia, unspecified: Secondary | ICD-10-CM | POA: Diagnosis not present

## 2022-10-04 DIAGNOSIS — Z951 Presence of aortocoronary bypass graft: Secondary | ICD-10-CM | POA: Diagnosis not present

## 2022-10-04 DIAGNOSIS — E1159 Type 2 diabetes mellitus with other circulatory complications: Secondary | ICD-10-CM | POA: Diagnosis not present

## 2022-10-04 MED ORDER — VALSARTAN 320 MG PO TABS: 320.0000 mg | ORAL_TABLET | Freq: Every day | ORAL | 3 refills | Status: DC

## 2022-10-04 NOTE — Progress Notes (Signed)
Cardiology Office Note:   Date:  10/04/2022  ID:  [name], DOB 1955/11/02, MRN [id number]  History of Present Illness:   Craig Harvey is a 67 y.o. male with a history of coronary artery disease s/p CABG x 3 vessels in July 2016 with PCI/DES to the proximal RCA in June 2023 with postoperative atrial fibrillation, hypertension, hyperlipidemia, obesity, OSA on CPAP, Type II diabetes, who present today for follow-up on his CAD and HTN.  He was admitted to the hospital on 01/2015 and ruled out for MI. Echo revealed EF 60-65%, no RWMA, and no significant valvular abnormalities. Lexiscan Myoview showed a large defect of  moderate severity. LHC demonstrated severe two vessel CAD and transferred to [facility] where he underwent three-vessel CABG with LIMA->LAD, SVG->OM, and SVG->ramus. Postoperative course was complicated by atrial fibrillation and required IV amiodarone. In June 2023 he was seen on unstable angina. Echo revealed EF 60-65%, no RWMA, G1DD, no valvular abnormalities. Was discharged and returned the same day. Underwent LHC which demonstrated severe native vessel disease with three patent grafts, occlusion noted in the SVG->ramus, culprit lesion was a 95% focal pRCA stenosis with successful PCI/DES. Returned to the facility 04/12/22 with chest pain and liable blood pressure. He underwent Lexiscan Myoview which revealed no evidence of ischemia, low risk study.   He was last seen in clinic 07/02/22 with complaints of muscular chest wall pain. Pain was reproducible. He had also had several visits to the emergency department for chest pain or shortness of breath. Symptoms continued as well as he noted his blood sugar had been dropping. He was started on amlodipine 5 mg daily to take at 5pm and was encouraged to continue to keep a blood pressure log at home. He was scheduled for an echocardiogram as previously discussed at his last hospitalization.   He returns to clinic today stating that overall  he has been doing fairly well.  He continues to have exertional fatigue.  Denies any recent hospitalizations or visits to the emergency department.  He also states that his blood pressure and his blood sugar been better controlled.  He has been compliant with with all of his medications.   ROS: Review of systems is negative with the exception of what is listed in the HPI.  Studies Reviewed:    EKG: No new tracings were completed today  TTE 09/10/22 1. Left ventricular ejection fraction, by estimation, is 60 to 65%. The  left ventricle has normal function. The left ventricle has no regional  wall motion abnormalities. There is moderate asymmetric left ventricular  hypertrophy of the basal-septal  segment. Left ventricular diastolic parameters are consistent with Grade I  diastolic dysfunction (impaired relaxation).   2. Right ventricular systolic function is normal. The right ventricular  size is normal. There is normal pulmonary artery systolic pressure. The  estimated right ventricular systolic pressure is [id number] mmHg.   3. Left atrial size was mild to moderately dilated.   4. The mitral valve is normal in structure. Mild mitral valve  regurgitation. No evidence of mitral stenosis.   5. The aortic valve has an indeterminant number of cusps, unable to  exclude bicuspid aortic valve. Aortic valve regurgitation is not  visualized. No aortic stenosis is present.   6. There is mild dilatation of the aortic root, measuring 40 mm. There is  borderline dilatation of the ascending aorta, measuring 36 mm.   7. The inferior vena cava is normal in size with greater than 50%  respiratory variability, suggesting right atrial pressure of 3 mmHg.   Risk Assessment/Calculations:              Physical Exam:   VS:  BP 133/87 (BP Location: Left Arm, Patient Position: Sitting, Cuff Size: Large)   Pulse 73   Ht 5\' 7"  (1.702 m)   Wt 276 lb (125.2 kg)   SpO2 98%   BMI 43.23 kg/m    Wt Readings from  Last 3 Encounters:  10/04/22 276 lb (125.2 kg)  07/26/22 274 lb (124.3 kg)  07/06/22 278 lb 6.4 oz (126.3 kg)     GEN: Well nourished, well developed in no acute distress NECK: No JVD; No carotid bruits CARDIAC: RRR, no murmurs, rubs, gallops RESPIRATORY:  Clear to auscultation without rales, wheezing or rhonchi  ABDOMEN: Soft, non-tender, obese, non-distended EXTREMITIES:  No edema; No deformity   ASSESSMENT AND PLAN:   Coronary artery disease status post CABG without angina.  States that he has been doing well.  States his blood pressure is continued to be better controlled he has not had any further issues with chest discomfort.  His Lexiscan MPI was low risk with no ischemia noted.  He is continued on aspirin, atorvastatin, Zetia, Imdur, and Nitrostat as needed for chest discomfort.  Essential hypertension with blood pressure 133/87.  Continues to remain stable.  He is continued on amlodipine 5 mg daily and imdur 60 mg daily, Toprol-XL 25 mg daily, and valsartan 320 mg daily.  He is requesting a 90-day refill of his valsartan today sent to the pharmacy of his choice.  He is also been encouraged to continue to monitor his blood pressures at home.  Mixed hyperlipidemia with LDL 41 on 07/19/2022.  He has remained at goal.  He is continued on atorvastatin 80 mg daily and Zetia 10 mg daily.  Type 2 diabetes with last hemoglobin A1c 5.7.  He is continued on insulin therapy continues to be managed and followed by PCP.  Obstructive sleep apnea, has been compliant on CPAP.  Disposition patient return to clinic to see MD/APP in 4 months or sooner if needed.        Signed, Yareli Carthen, NP

## 2022-10-04 NOTE — Patient Instructions (Signed)
Medication Instructions:  No changes at this time.   *If you need a refill on your cardiac medications before your next appointment, please call your pharmacy*   Lab Work: None  If you have labs (blood work) drawn today and your tests are completely normal, you will receive your results only by: [place] (if you have MyChart) OR A paper copy in the mail If you have any lab test that is abnormal or we need to change your treatment, we will call you to review the results.   Testing/Procedures: None   Follow-Up: At [facility], you and your health needs are our priority.  As part of our continuing mission to provide you with exceptional heart care, we have created designated Provider Care Teams.  These Care Teams include your primary Cardiologist (physician) and Advanced Practice Providers (APPs -  Physician Assistants and Nurse Practitioners) who all work together to provide you with the care you need, when you need it.  We recommend signing up for the patient portal called "MyChart".  Sign up information is provided on this After Visit Summary.  MyChart is used to connect with patients for Virtual Visits (Telemedicine).  Patients are able to view lab/test results, encounter notes, upcoming appointments, etc.  Non-urgent messages can be sent to your provider as well.   To learn more about what you can do with MyChart, go to [email or web address].    Your next appointment:   4 month(s)  Provider:   Ida Rogue, MD or Gerrie Nordmann, NP

## 2022-10-11 ENCOUNTER — Other Ambulatory Visit: Payer: Self-pay | Admitting: Family Medicine

## 2022-10-11 DIAGNOSIS — I2510 Atherosclerotic heart disease of native coronary artery without angina pectoris: Secondary | ICD-10-CM

## 2022-10-11 DIAGNOSIS — E1169 Type 2 diabetes mellitus with other specified complication: Secondary | ICD-10-CM

## 2022-10-11 DIAGNOSIS — I10 Essential (primary) hypertension: Secondary | ICD-10-CM

## 2022-10-11 DIAGNOSIS — K219 Gastro-esophageal reflux disease without esophagitis: Secondary | ICD-10-CM

## 2022-10-12 ENCOUNTER — Other Ambulatory Visit: Payer: Self-pay | Admitting: Family Medicine

## 2022-10-12 DIAGNOSIS — E1169 Type 2 diabetes mellitus with other specified complication: Secondary | ICD-10-CM

## 2022-10-12 NOTE — Telephone Encounter (Signed)
Requested medication (s) are due for refill today: yes  Requested medication (s) are on the active medication list: yes  Last refill:  06/07/22  Future visit scheduled: yes  Notes to clinic:  Unable to refill per protocol, cannot delegate.      Requested Prescriptions  Pending Prescriptions Disp Refills   metoCLOPramide (REGLAN) 10 MG tablet [Pharmacy Med Name: Metoclopramide HCl 10 MG Oral Tablet] 30 tablet 0    Sig: TAKE 1 TABLET BY MOUTH EVERY 8 HOURS AS NEEDED FOR NAUSEA (HEART BURN)     Not Delegated - Gastroenterology: Antiemetics - metoclopramide Failed - 10/11/2022 10:03 AM      Failed - This refill cannot be delegated      Passed - Cr in normal range and within 360 days    Creat  Date Value Ref Range Status  07/19/2022 0.76 0.70 - 1.35 mg/dL Final   Creatinine, Urine  Date Value Ref Range Status  07/26/2022 92 20 - 320 mg/dL Final         Passed - Valid encounter within last 6 months    Recent Outpatient Visits           1 month ago Essential hypertension   [place] [facility] Delles, Jackelyn Poling, RPH-CPP   2 months ago Annual physical exam   [place] [facility] [place], Netta Neat, DO   3 months ago DM (diabetes mellitus), type 2 with peripheral vascular complications [facility])   [place] [facility] Smitty Cords, DO   3 months ago Essential hypertension   [place] [facility] Delles, Gentry Fitz A, RPH-CPP   4 months ago OSA on CPAP   [place] [facility] [place], Netta Neat, DO       Future Appointments             In 3 months Hamm[place], Lavonna Rua, NP [place] [facility] [employer] [facility] Prescriptions Disp Refills   ezetimibe (ZETIA) 10 MG tablet 90 tablet 0    Sig: Take 1 tablet by mouth once daily     Cardiovascular:  Antilipid - Sterol Transport Inhibitors Failed - 10/11/2022 10:03 AM      Failed - Lipid Panel in normal  range within the last 12 months    Cholesterol, Total  Date Value Ref Range Status  06/09/2016 158 100 - 199 mg/dL Final   Cholesterol  Date Value Ref Range Status  07/19/2022 92 <200 mg/dL Final   LDL Cholesterol (Calc)  Date Value Ref Range Status  07/19/2022 41 mg/dL (calc) Final    Comment:    Reference range: <100 . Desirable range <100 mg/dL for primary prevention;   <70 mg/dL for patients with CHD or diabetic patients  with > or = 2 CHD risk factors. Marland Kitchen LDL-C is now calculated using the Martin-Hopkins  calculation, which is a validated novel method providing  better accuracy than the Friedewald equation in the  estimation of LDL-C.  Horald Pollen et al. Lenox Ahr. 1610;960(45): 2061-2068  (http://education.Quest[employer].com/faq/FAQ164)    HDL  Date Value Ref Range Status  07/19/2022 37 (L) > OR = 40 mg/dL Final  40/98/1191 46 >47 mg/dL Final   Triglycerides  Date Value Ref Range Status  07/19/2022 55 <150 mg/dL Final         Passed - AST in normal range and within 360 days    AST  Date  Value Ref Range Status  07/19/2022 19 10 - 35 U/L Final         Passed - ALT in normal range and within 360 days    ALT  Date Value Ref Range Status  07/19/2022 19 9 - 46 U/L Final         Passed - Patient is not pregnant      Passed - Valid encounter within last 12 months    Recent Outpatient Visits           1 month ago Essential hypertension   [place] [facility] Delles, Jackelyn Poling, RPH-CPP   2 months ago Annual physical exam   [place] [facility] [place], Netta Neat, DO   3 months ago DM (diabetes mellitus), type 2 with peripheral vascular complications [facility])   [place] [facility] Smitty Cords, DO   3 months ago Essential hypertension   [place] [facility] Delles, Gentry Fitz A, RPH-CPP   4 months ago OSA on CPAP   [place] [facility] [place],  Netta Neat, DO       Future Appointments             In 3 months Hammock, Lavonna Rua, NP [place] [facility]             isosorbide mononitrate (IMDUR) 60 MG 24 hr tablet 90 tablet 0    Sig: Take 1 tablet by mouth once daily     Cardiovascular:  Nitrates Passed - 10/11/2022 10:03 AM      Passed - Last BP in normal range    BP Readings from Last 1 Encounters:  10/04/22 133/87         Passed - Last Heart Rate in normal range    Pulse Readings from Last 1 Encounters:  10/04/22 73         Passed - Valid encounter within last 12 months    Recent Outpatient Visits           1 month ago Essential hypertension   [place] [facility] Delles, Jackelyn Poling, RPH-CPP   2 months ago Annual physical exam   [place] [facility] [place], Netta Neat, DO   3 months ago DM (diabetes mellitus), type 2 with peripheral vascular complications [facility])   [place] [facility] [place], Netta Neat, DO   3 months ago Essential hypertension   [place] [facility] Delles, Gentry Fitz A, RPH-CPP   4 months ago OSA on CPAP   [place] [facility] [place], Netta Neat, DO       Future Appointments             In 3 months Hammock, Lavonna Rua, NP  [facility] at [place]

## 2022-10-12 NOTE — Telephone Encounter (Signed)
Requested Prescriptions  Pending Prescriptions Disp Refills   metoCLOPramide (REGLAN) 10 MG tablet [Pharmacy Med Name: Metoclopramide HCl 10 MG Oral Tablet] 30 tablet 0    Sig: TAKE 1 TABLET BY MOUTH EVERY 8 HOURS AS NEEDED FOR NAUSEA (HEART BURN)     Not Delegated - Gastroenterology: Antiemetics - metoclopramide Failed - 10/11/2022 10:03 AM      Failed - This refill cannot be delegated      Passed - Cr in normal range and within 360 days    Creat  Date Value Ref Range Status  07/19/2022 0.76 0.70 - 1.35 mg/dL Final   Creatinine, Urine  Date Value Ref Range Status  07/26/2022 92 20 - 320 mg/dL Final         Passed - Valid encounter within last 6 months    Recent Outpatient Visits           1 month ago Essential hypertension   [place] Harvey, Craig Harvey, RPH-CPP   2 months ago Annual physical exam   [place] [facility] [place], Netta Neat, DO   3 months ago DM (diabetes mellitus), type 2 with peripheral vascular complications [facility])   [place] [facility] Smitty Cords, DO   3 months ago Essential hypertension   [place] [facility] Harvey, Gentry Fitz A, RPH-CPP   4 months ago OSA on CPAP   [facility] [place], Netta Neat, DO       Future Appointments             In 3 months Hammock, Lavonna Rua, NP [facility]             ezetimibe (ZETIA) 10 MG tablet [Pharmacy Med Name: Ezetimibe 10 MG Oral Tablet] 90 tablet 0    Sig: Take 1 tablet by mouth once daily     Cardiovascular:  Antilipid - Sterol Transport Inhibitors Failed - 10/11/2022 10:03 AM      Failed - Lipid Panel in normal range within the last 12 months    Cholesterol, Total  Date Value Ref Range Status  06/09/2016 158 100 - 199 mg/dL Final   Cholesterol  Date Value Ref Range Status  07/19/2022 92 <200 mg/dL Final   LDL Cholesterol (Calc)  Date Value  Ref Range Status  07/19/2022 41 mg/dL (calc) Final    Comment:    Reference range: <100 . Desirable range <100 mg/dL for primary prevention;   <70 mg/dL for patients with CHD or diabetic patients  with > or = 2 CHD risk factors. Marland Kitchen LDL-C is now calculated using the Martin-Hopkins  calculation, which is a validated novel method providing  better accuracy than the Friedewald equation in the  estimation of LDL-C.  Horald Pollen et al. Lenox Ahr. 3291;916(60): 2061-2068  (http://education.[facility].com/faq/FAQ164)    HDL  Date Value Ref Range Status  07/19/2022 37 (L) > OR = 40 mg/dL Final  60/10/5995 46 >74 mg/dL Final   Triglycerides  Date Value Ref Range Status  07/19/2022 55 <150 mg/dL Final         Passed - AST in normal range and within 360 days    AST  Date Value Ref Range Status  07/19/2022 19 10 - 35 U/L Final         Passed - ALT in normal range and within 360 days    ALT  Date Value Ref Range Status  07/19/2022 19 9 -  46 U/L Final         Passed - Patient is not pregnant      Passed - Valid encounter within last 12 months    Recent Outpatient Visits           1 month ago Essential hypertension   [place] [facility] Harvey, Craig Harvey, RPH-CPP   2 months ago Annual physical exam   [facility] [place], Netta Neat, DO   3 months ago DM (diabetes mellitus), type 2 with peripheral vascular complications [facility])   [place] [facility] Smitty Cords, DO   3 months ago Essential hypertension   [place] [facility] Harvey, Gentry Fitz A, RPH-CPP   4 months ago OSA on CPAP   [place] [facility] [place], Netta Neat, DO       Future Appointments             In 3 months Charlsie Quest, NP [place] [facility]             isosorbide mononitrate (IMDUR) 60 MG 24 hr tablet [Pharmacy Med Name: Isosorbide Mononitrate ER 60 MG  Oral Tablet Extended Release 24 Hour] 90 tablet 0    Sig: Take 1 tablet by mouth once daily     Cardiovascular:  Nitrates Passed - 10/11/2022 10:03 AM      Passed - Last BP in normal range    BP Readings from Last 1 Encounters:  10/04/22 133/87         Passed - Last Heart Rate in normal range    Pulse Readings from Last 1 Encounters:  10/04/22 73         Passed - Valid encounter within last 12 months    Recent Outpatient Visits           1 month ago Essential hypertension   [place] [facility] Harvey, Craig Harvey, RPH-CPP   2 months ago Annual physical exam   [place] [facility] [place], Netta Neat, DO   3 months ago DM (diabetes mellitus), type 2 with peripheral vascular complications [facility])   [place] [facility] Smitty Cords, DO   3 months ago Essential hypertension   [place] [facility] Harvey, Gentry Fitz A, RPH-CPP   4 months ago OSA on CPAP   [place] [facility] [place], Netta Neat, DO       Future Appointments             In 3 months Hammock, Lavonna Rua, NP  [facility] [place]

## 2022-10-13 NOTE — Telephone Encounter (Signed)
Unable to refill per protocol, Rx request is too soon. Last refill 10/12/22 for 90 days, duplicate request.  Requested Prescriptions  Pending Prescriptions Disp Refills   ezetimibe (ZETIA) 10 MG tablet [Pharmacy Med Name: Ezetimibe 10 MG Oral Tablet] 90 tablet 0    Sig: Take 1 tablet by mouth once daily     Cardiovascular:  Antilipid - Sterol Transport Inhibitors Failed - 10/12/2022  5:08 PM      Failed - Lipid Panel in normal range within the last 12 months    Cholesterol, Total  Date Value Ref Range Status  06/09/2016 158 100 - 199 mg/dL Final   Cholesterol  Date Value Ref Range Status  07/19/2022 92 <200 mg/dL Final   LDL Cholesterol (Calc)  Date Value Ref Range Status  07/19/2022 41 mg/dL (calc) Final    Comment:    Reference range: <100 . Desirable range <100 mg/dL for primary prevention;   <70 mg/dL for patients with CHD or diabetic patients  with > or = 2 CHD risk factors. Marland Kitchen LDL-C is now calculated using the Martin-Hopkins  calculation, which is a validated novel method providing  better accuracy than the Friedewald equation in the  estimation of LDL-C.  Horald Pollen et al. Lenox Ahr. 1117;356(70): 2061-2068  (http://education.[facility].com/faq/FAQ164)    HDL  Date Value Ref Range Status  07/19/2022 37 (L) > OR = 40 mg/dL Final  14/04/3012 46 >14 mg/dL Final   Triglycerides  Date Value Ref Range Status  07/19/2022 55 <150 mg/dL Final         Passed - AST in normal range and within 360 days    AST  Date Value Ref Range Status  07/19/2022 19 10 - 35 U/L Final         Passed - ALT in normal range and within 360 days    ALT  Date Value Ref Range Status  07/19/2022 19 9 - 46 U/L Final         Passed - Patient is not pregnant      Passed - Valid encounter within last 12 months    Recent Outpatient Visits           1 month ago Essential hypertension   [place] [facility] [place], Jackelyn Poling, RPH-CPP   2 months ago Annual physical exam    [place] [facility] [place], Netta Neat, DO   3 months ago DM (diabetes mellitus), type 2 with peripheral vascular complications [facility])   [place] [facility] Smitty Cords, DO   3 months ago Essential hypertension   [place] [facility] [place], Gentry Fitz A, RPH-CPP   4 months ago OSA on CPAP   [place] [facility] [place], Netta Neat, DO       Future Appointments             In 3 months Hammock, Lavonna Rua, NP [place] [facility] [place]

## 2022-11-02 ENCOUNTER — Encounter: Payer: Self-pay | Admitting: Family Medicine

## 2022-11-02 ENCOUNTER — Other Ambulatory Visit: Payer: Self-pay | Admitting: Family Medicine

## 2022-11-02 ENCOUNTER — Ambulatory Visit: Payer: Medicaid Other | Admitting: Family Medicine

## 2022-11-02 VITALS — BP 120/70 | HR 73 | Ht 67.0 in | Wt 276.0 lb

## 2022-11-02 DIAGNOSIS — M25511 Pain in right shoulder: Secondary | ICD-10-CM

## 2022-11-02 DIAGNOSIS — M25512 Pain in left shoulder: Secondary | ICD-10-CM | POA: Diagnosis not present

## 2022-11-02 DIAGNOSIS — I10 Essential (primary) hypertension: Secondary | ICD-10-CM

## 2022-11-02 DIAGNOSIS — G8929 Other chronic pain: Secondary | ICD-10-CM

## 2022-11-02 DIAGNOSIS — I2510 Atherosclerotic heart disease of native coronary artery without angina pectoris: Secondary | ICD-10-CM

## 2022-11-02 DIAGNOSIS — M62830 Muscle spasm of back: Secondary | ICD-10-CM

## 2022-11-02 DIAGNOSIS — K219 Gastro-esophageal reflux disease without esophagitis: Secondary | ICD-10-CM | POA: Diagnosis not present

## 2022-11-02 DIAGNOSIS — Z23 Encounter for immunization: Secondary | ICD-10-CM | POA: Diagnosis not present

## 2022-11-02 MED ORDER — SHINGRIX 50 MCG/0.5ML IM SUSR: INTRAMUSCULAR | 1 refills | Status: DC

## 2022-11-02 MED ORDER — PANTOPRAZOLE SODIUM 40 MG PO TBEC: 40.0000 mg | DELAYED_RELEASE_TABLET | Freq: Every day | ORAL | 2 refills | Status: DC

## 2022-11-02 MED ORDER — CLOPIDOGREL BISULFATE 75 MG PO TABS: 75.0000 mg | ORAL_TABLET | Freq: Every day | ORAL | 1 refills | Status: DC

## 2022-11-02 MED ORDER — METOPROLOL SUCCINATE ER 25 MG PO TB24: 25.0000 mg | ORAL_TABLET | Freq: Every day | ORAL | 1 refills | Status: DC

## 2022-11-02 NOTE — Progress Notes (Signed)
Subjective:    Patient ID: GENERAL WEARING, male    DOB: [birth year], 67 y.o.   MRN: [id number]  Craig Harvey is a 67 y.o. male presenting on 11/02/2022 for Gastroesophageal Reflux (Present a few months, pepcid helps)   HPI  GERD / Dyspepsia Reports epigastric vs central symptoms of burning acid sensation worse with bending forward. Actually improved if lay down. Improved after BMs. - Taking Famotidine 20mg  twice a day with some relief. Not taking every day but it is helping. - Has Reglan for nausea vomiting but not taking regularly was advised only use if needed Not on PPI therapy  Bilateral Shoulders aching Left muscle spasm Episodic, occur in AM and can go away at night. If sitting can happen. Taking Tylenol Arthritis strength Tried Baclofen AS NEEDED for shoulders it does help  Additionally he has chronic low back and leg pain, and has received [employer], 11/2022, he will need documentation letter for jury duty excuse. Due to these symptoms.  HM Due for Shingles vaccine, will check w/ pharmacy     11/02/2022    3:19 PM 07/26/2022    1:50 PM 07/06/2022    3:17 PM  Depression screen PHQ 2/9  Decreased Interest 0 2 2  Down, Depressed, Hopeless 0 2 2  PHQ - 2 Score 0 4 4  Altered sleeping 0 1 1  Tired, decreased energy 0 1 1  Change in appetite 0 0 0  Feeling bad or failure about yourself  0 1 1  Trouble concentrating 0 1 1  Moving slowly or fidgety/restless 0 0 0  Suicidal thoughts 0 0 0  PHQ-9 Score 0 8 8  Difficult doing work/chores  Somewhat difficult Somewhat difficult    Social History   Tobacco Use   Smoking status: Former    Types: Cigarettes    Quit date: 08/06/1975    Years since quitting: 47.2   Smokeless tobacco: Never  Vaping Use   Vaping Use: Never used  Substance Use Topics   Alcohol use: No   Drug use: No    Review of Systems Per HPI unless specifically indicated above     Objective:    BP 120/70   Pulse 73   Ht 5\' 7"  (1.702 m)    Wt 276 lb (125.2 kg)   SpO2 99%   BMI 43.23 kg/m   Wt Readings from Last 3 Encounters:  11/02/22 276 lb (125.2 kg)  10/04/22 276 lb (125.2 kg)  07/26/22 274 lb (124.3 kg)    Physical Exam Vitals and nursing note reviewed.  Constitutional:      General: He is not in acute distress.    Appearance: He is well-developed. He is obese. He is not diaphoretic.     Comments: Well-appearing, comfortable, cooperative  HENT:     Head: Normocephalic and atraumatic.  Eyes:     General:        Right eye: No discharge.        Left eye: No discharge.     Conjunctiva/sclera: Conjunctivae normal.  Neck:     Thyroid: No thyromegaly.  Cardiovascular:     Rate and Rhythm: Normal rate and regular rhythm.     Pulses: Normal pulses.     Heart sounds: Normal heart sounds. No murmur heard. Pulmonary:     Effort: Pulmonary effort is normal. No respiratory distress.     Breath sounds: Normal breath sounds. No wheezing or rales.  Musculoskeletal:  General: Normal range of motion.     Cervical back: Normal range of motion and neck supple.     Comments: L Trapezius muscle hypertonicity  Lymphadenopathy:     Cervical: No cervical adenopathy.  Skin:    General: Skin is warm and dry.     Findings: No erythema or rash.  Neurological:     Mental Status: He is alert and oriented to person, place, and time. Mental status is at baseline.  Psychiatric:        Behavior: Behavior normal.     Comments: Well groomed, good eye contact, normal speech and thoughts    Results for orders placed or performed in visit on 09/10/22  ECHOCARDIOGRAM COMPLETE  Result Value Ref Range   AR max vel 5.48 cm2   AV Peak grad 4.2 mmHg   Ao pk vel 1.03 m/s   S' Lateral 4.30 cm   Area-P 1/2 2.95 cm2   AV Area VTI 5.67 cm2   AV Mean grad 2.0 mmHg   Single Plane A4C EF 53.8 %   Single Plane A2C EF 53.5 %   Calc EF 55.3 %   AV Area mean vel 5.67 cm2   Est EF 60 - 65%       Assessment & Plan:   Problem List  Items Addressed This Visit     CAD (coronary artery disease), native coronary artery   Relevant Medications   metoprolol succinate (TOPROL-XL) 25 MG 24 hr tablet   Essential hypertension   Relevant Medications   metoprolol succinate (TOPROL-XL) 25 MG 24 hr tablet   Gastroesophageal reflux disease without esophagitis - Primary   Relevant Medications   docusate sodium (COLACE) 100 MG capsule   pantoprazole (PROTONIX) 40 MG tablet   Other Visit Diagnoses     Need for shingles vaccine       Relevant Medications   SHINGRIX injection   Chronic pain of both shoulders       Spasm of left trapezius muscle       CAD, multiple vessel       Relevant Medications   metoprolol succinate (TOPROL-XL) 25 MG 24 hr tablet   clopidogrel (PLAVIX) 75 MG tablet        For stomach acid Stop Pepcid for now Start stronger acid blocker - Pantoprazole 40mg  daily every day for next 1-3 months, take before first meal each day. If this is effective keep taking it, if it is not enough we can add Pepcid or a 2nd dose in evening.  For shoulder pain / muscle spasm Consider X-rays Keep on the Tylenol Add the Baclofen more often if you need it for muscle spasm. Take max up to 1 tab 8 hours or 3 per day. May try topical - START anti inflammatory topical - OTC Voltaren (generic Diclofenac) topical 2-4 times a day as needed for pain swelling of affected joint for 1-2 weeks or longer. Consider heating pad and moist heat to help relieve  Re order medications as requested.   Meds ordered this encounter  Medications   SHINGRIX injection    Sig: Inject 0.5 mL into muscle for shingles vaccine. Repeat dose in 2-6 months.    Dispense:  0.5 mL    Refill:  1   pantoprazole (PROTONIX) 40 MG tablet    Sig: Take 1 tablet (40 mg total) by mouth daily before breakfast.    Dispense:  30 tablet    Refill:  2   metoprolol succinate (TOPROL-XL) 25 MG  24 hr tablet    Sig: Take 1 tablet (25 mg total) by mouth daily.     Dispense:  90 tablet    Refill:  1   clopidogrel (PLAVIX) 75 MG tablet    Sig: Take 1 tablet (75 mg total) by mouth daily.    Dispense:  90 tablet    Refill:  1     Follow up plan: Return in about 3 months (around 02/01/2023) for 3 month DM A1c, HTN, GERD Shoulder updates.  Saralyn Pilar, DO [facility] 11/02/2022, 3:32 PM

## 2022-11-02 NOTE — Patient Instructions (Addendum)
Thank you for coming to the office today.  For stomach acid Stop Pepcid for now Start stronger acid blocker - Pantoprazole 40mg  daily every day for next 1-3 months, take before first meal each day. If this is effective keep taking it, if it is not enough we can add Pepcid or a 2nd dose in evening.  For shoulder pain / muscle spasm Keep on the Tylenol Add the Baclofen more often if you need it for muscle spasm. Take max up to 1 tab 8 hours or 3 per day. May try topical - START anti inflammatory topical - OTC Voltaren (generic Diclofenac) topical 2-4 times a day as needed for pain swelling of affected joint for 1-2 weeks or longer. Consider heating pad and moist heat to help relieve    Please schedule a Follow-up Appointment to: Return in about 3 months (around 02/01/2023) for 3 month DM A1c, HTN, GERD Shoulder updates.  If you have any other questions or concerns, please feel free to call the office or send a message through MyChart. You may also schedule an earlier appointment if necessary.  Additionally, you may be receiving a survey about your experience at our office within a few days to 1 week by e-mail or mail. We value your feedback.  Saralyn Pilar, DO [facility], [place]

## 2022-11-05 ENCOUNTER — Ambulatory Visit: Payer: Medicaid Other | Admitting: Pharmacist

## 2022-11-05 DIAGNOSIS — I10 Essential (primary) hypertension: Secondary | ICD-10-CM

## 2022-11-05 DIAGNOSIS — E1151 Type 2 diabetes mellitus with diabetic peripheral angiopathy without gangrene: Secondary | ICD-10-CM

## 2022-11-05 NOTE — Patient Instructions (Signed)
Goals Addressed             This Visit's Progress    Pharmacy Goals       Our goal A1c is less than 7%. This corresponds with fasting sugars less than 130 and 2 hour after meal sugars less than 180. Please keep a log of your results when checking your blood sugar   Our goal bad cholesterol, or LDL, is less than 70 . This is why it is important to continue taking your atorvastatin and ezetimibe.  Check your blood pressure daily, and any time you have concerning symptoms like headache, chest pain, dizziness, shortness of breath, or vision changes.   Our goal is less than 130/80.  To appropriately check your blood pressure, make sure you do the following:  1) Avoid caffeine, exercise, or tobacco products for 30 minutes before checking. Empty your bladder. 2) Sit with your back supported in a flat-backed chair. Rest your arm on something flat (arm of the chair, table, etc). 3) Sit still with your feet flat on the floor, resting, for at least 5 minutes.  4) Check your blood pressure. Take 1-2 readings.  5) Write down these readings and bring with you to any provider appointments.  Bring your home blood pressure machine with you to a provider's office for accuracy comparison at least once a year.   Make sure you take your blood pressure medications before you come to any office visit, even if you were asked to fast for labs.   Atreus Hasz Kyri Dai, PharmD, BCACP, CPP Clinical Pharmacist [facility] [place] [phone]         

## 2022-11-05 NOTE — Progress Notes (Signed)
11/05/2022 Name: Craig Harvey MRN: [id number] DOB: 02/19/1956  Chief Complaint  Patient presents with   Medication Management    Craig Harvey is a 67 y.o. year old male who presented for a telephone visit.   They were referred to the pharmacist by their PCP for assistance in managing diabetes and hypertension.   Patient is participating in a Managed Medicaid Plan:  Yes, [employer] Plan   Subjective:  Care Team: Primary Care Provider: Smitty Cords, DO; Next Scheduled Visit: 02/01/2023 Cardiologist: [facility] [place] ; Next Scheduled Visit: 02/08/2023  Medication Access/Adherence  Current Pharmacy:  [facility] - [place], [place] - [address] [place] [address] Phone: [phone] Fax: [phone]  [id number] - [address] ([address]), [place] - [address] [place] ([place]) [place] [address] Phone: [phone] Fax: [phone]   Patient reports affordability concerns with their medications: No  Patient reports access/transportation concerns to their pharmacy: No  Patient reports adherence concerns with their medications:  No    Uses weekly pillbox; denies missed doses  Perform chart review. Patient seen for Office Visit with PCP on 4/30 related to GERD. Provider advised patient: Stop Pepcid for now Start stronger acid blocker - Pantoprazole 40mg  daily every day for next 1-3 months, take before first meal each day  Today patient reports started pantoprazole yesterday and confirms taking doses ~30 minutes prior to breakfast  Hypertension:   Patient followed by [facility]    Current medications:  - amlodipine 5 mg daily - metoprolol ER 25 mg daily - valsartan 320 mg daily - isosorbide ER 60 mg daily   Previous therapies tried: lisinopril, HCTZ (discontinued 12/2021 related to hyponatremia); amlodipine   Reports uses weekly pillbox to organize  his medications; denies missed doses   Home Monitoring: Patient has an automated upper arm home BP machine Counsel on BP monitoring technique Current blood pressure readings:  - Today: 133/77, HR 60 - 5/2: 125/70, HR 58 - 5/1: 125/67, HR 61; 117/63, HR 75 - 4/30: 125/73, HR 61; 141/77, HR 63   Per patient and provider, patient with history of labile blood pressure   Current physical activity: Walking ~20 minutes or riding bike ~15-20 minutes x 3-4 days/week   OSA/CPAP: Reports using CPAP  Limiting salt/sodium in his diet. Reviews nutrition labels for sodium content     Diabetes:   Current medications:  - Humalog Mix 75/25 - Injects 17 units twice daily with meals - metformin 1000 mg - 1/2 tablet (500 mg) twice daily   Current glucose readings: reports recently ranging 85-97 before both breakfast and supper; this morning: 97   Denies recent symptoms of hypoglycemia  Confirms picked up glucose tablets   Current physical activity: Walking ~20 minutes or riding bike ~15-20 minutes x 3-4 days/week     Hyperlipidemia/ASCVD Risk Reduction   Current lipid lowering medications: atorvastatin 80 mg; ezetimibe 10 mg daily Medications tried in the past:    Antiplatelet regimen: aspirin 81 mg; clopidogrel 75 mg daily   ASCVD History: CAD s/p CABG x 05 January 2015 and PCI with balloon angioplasty and DES proximal RCA June 2023  Risk Factors: hypertension, hyperlipidemia, obesity, OSA on CPAP, T2DM    Current physical activity: Walking ~20 minutes or riding bike ~15-20 minutes x 3-4 days/week       Objective:  Lab Results  Component Value Date   HGBA1C 5.7 (H) 07/19/2022  Lab Results  Component Value Date   CREATININE 0.76 07/19/2022   BUN 11 07/19/2022   NA 136 07/19/2022   K 4.3 07/19/2022   CL 101 07/19/2022   CO2 25 07/19/2022    Lab Results  Component Value Date   CHOL 92 07/19/2022   HDL 37 (L) 07/19/2022   LDLCALC 41 07/19/2022   TRIG 55 07/19/2022    CHOLHDL 2.5 07/19/2022   BP Readings from Last 3 Encounters:  11/02/22 120/70  10/04/22 133/87  07/26/22 128/80   Pulse Readings from Last 3 Encounters:  11/02/22 73  10/04/22 73  07/26/22 85     Medications Reviewed Today     Reviewed by Smitty Cords, DO (Physician) on 11/02/22 at 1532  Med List Status: <None>   Medication Order Taking? Sig Documenting Provider Last Dose Status Informant  Accu-Chek Softclix Lancets lancets [id number] Yes Use to check blood sugar up to 3 x daily Smitty Cords, DO Taking Active   aluminum-magnesium hydroxide-simethicone (MAALOX) 200-200-20 MG/5ML SUSP [id number] No Take 30 mLs by mouth 4 (four) times daily -  before meals and at bedtime.  Patient not taking: Reported on 11/02/2022   Sharman Cheek, MD Not Taking Consider Medication Status and Discontinue   amLODipine (NORVASC) 5 MG tablet [id number] Yes TAKE 1 TABLET BY MOUTH ONCE DAILY AT 5 PM Gollan, Tollie Pizza, MD Taking Active   aspirin EC 81 MG tablet [id number] Yes Take 1 tablet (81 mg total) by mouth daily. Antonieta Iba, MD Taking Active Pharmacy Records, Self  atorvastatin (LIPITOR) 80 MG tablet [id number] Yes Take 1 tablet by mouth once daily Gollan, Tollie Pizza, MD Taking Active   baclofen (LIORESAL) 10 MG tablet [id number] Yes TAKE ONE-HALF TO ONE TABLET BY MOUTH TWICE DAILY AS NEEDED FOR MUSCLE SPASM (LEG PAIN) Smitty Cords, DO Taking Active   BD VEO INSULIN SYRINGE U/F 31G X 15/64" 0.3 ML MISC [id number] Yes USE AS DIRECTED TWICE DAILY Althea Charon Netta Neat, DO Taking Active Pharmacy Records, Self  Blood Glucose Monitoring Suppl (ACCU-CHEK GUIDE ME) w/Device KIT [id number] Yes Use to check blood sugar up to 3 x daily Smitty Cords, DO Taking Active   clopidogrel (PLAVIX) 75 MG tablet [id number] Yes Take 1 tablet by mouth once daily Smitty Cords, DO Taking Active   docusate sodium (COLACE) 100 MG capsule [id number] Yes Take 100 mg by  mouth 2 (two) times daily. [provider] Taking Active   ezetimibe (ZETIA) 10 MG tablet [id number] Yes Take 1 tablet by mouth once daily Smitty Cords, DO Taking Active   famotidine (PEPCID) 20 MG tablet [id number] Yes Take 1 tablet (20 mg total) by mouth 2 (two) times daily. Sharman Cheek, MD Taking Active   glucose blood (ACCU-CHEK GUIDE) test strip [id number] Yes Use to check blood sugar up to 3 x daily Smitty Cords, DO Taking Active   insulin lispro protamine-lispro (HUMALOG MIX 75/25) (75-25) 100 UNIT/ML SUSP injection [id number] Yes INJECT 17 UNITS UNDER THE SKIN TWICE DAILY WITH A MEAL. MAX OF 50 UNITS DAILY. Smitty Cords, DO Taking Active   isosorbide mononitrate (IMDUR) 60 MG 24 hr tablet [id number] Yes Take 1 tablet by mouth once daily Smitty Cords, DO Taking Active   metFORMIN (GLUCOPHAGE) 1000 MG tablet [id number] Yes Take 500 mg by mouth 2 (two) times daily with a meal. Althea Charon, Netta Neat, DO Taking Active   metoCLOPramide (REGLAN) 10 MG tablet [id number] Yes TAKE 1 TABLET BY MOUTH EVERY 8  HOURS AS NEEDED FOR NAUSEA (HEART BURN) Smitty Cords, DO Taking Active   metoprolol succinate (TOPROL-XL) 25 MG 24 hr tablet [id number] Yes Take 1 tablet (25 mg total) by mouth daily. Smitty Cords, DO Taking Active   nitroGLYCERIN (NITROSTAT) 0.4 MG SL tablet [id number] Yes Place 1 tablet (0.4 mg total) under the tongue every 5 (five) minutes as needed for chest pain. Antonieta Iba, MD Taking Active   sertraline (ZOLOFT) 50 MG tablet [id number] Yes Take 1 tablet (50 mg total) by mouth daily. Smitty Cords, DO Taking Active   Syringe, Disposable, 3 ML MISC [id number] Yes Use to inject humalog mix 75/25 17u BID Smitty Cords, DO Taking Active Pharmacy Records, Self  valsartan (DIOVAN) 320 MG tablet [id number] Yes Take 1 tablet (320 mg total) by mouth daily. Charlsie Quest, NP Taking Active                Assessment/Plan:   Diabetes: - Reviewed long term cardiovascular and renal outcomes of uncontrolled blood sugar - Reviewed goal A1c, goal fasting, and goal 2 hour post prandial glucose - Reviewed dietary modifications including having regular, well-balanced meals, while controlling carbohydrate portion sizes - Counseled patient on s/s of low blood sugar and how to treat lows - Discuss CGM for blood sugar control. Patient declines for now, but interested in discussing further in the future - Recommend patient to continue to monitor home blood sugar, keep log of the results and bring this record with him to upcoming appointment with PCP     Hypertension: - Encourage patient to use CPAP consistently - Reviewed long term cardiovascular and renal outcomes of uncontrolled blood pressure - Recommended to continue to check home blood pressure and heart rate, keep log of the results and bring this record with him to upcoming medical appointments   Health Maintenance - Discussed CDC/ACIP recommendations for Shingrix and Tetanus vaccines Patient to follow up with local pharmacy regarding scheduling of vaccinations    Follow Up Plan: Clinical Pharmacist will follow up with patient by telephone again on 03/11/2023 at 11:30 AM    Estelle Grumbles, PharmD, Patsy [place], CPP Clinical Pharmacist [facility] [phone]

## 2022-11-30 ENCOUNTER — Other Ambulatory Visit: Payer: Self-pay | Admitting: Family Medicine

## 2022-11-30 DIAGNOSIS — E1151 Type 2 diabetes mellitus with diabetic peripheral angiopathy without gangrene: Secondary | ICD-10-CM

## 2022-12-01 NOTE — Telephone Encounter (Signed)
Requested Prescriptions  Pending Prescriptions Disp Refills   BD VEO INSULIN SYRINGE U/F 31G X 15/64" 0.3 ML MISC [Pharmacy Med Name: BD INS SYR 0.3/31G/6MM MIS] 200 each 3    Sig: USE AS DIRECTED TWICE DAILY     There is no refill protocol information for this order

## 2022-12-10 ENCOUNTER — Other Ambulatory Visit: Payer: Self-pay | Admitting: Cardiovascular Disease

## 2022-12-17 ENCOUNTER — Other Ambulatory Visit: Payer: Self-pay | Admitting: Family Medicine

## 2022-12-17 DIAGNOSIS — E1169 Type 2 diabetes mellitus with other specified complication: Secondary | ICD-10-CM

## 2022-12-17 MED ORDER — ATORVASTATIN CALCIUM 80 MG PO TABS: 80.0000 mg | ORAL_TABLET | Freq: Every day | ORAL | 3 refills | Status: DC

## 2022-12-17 NOTE — Telephone Encounter (Signed)
Requested medication (s) are due for refill today: yes  Requested medication (s) are on the active medication list: yes  Last refill:  09/17/22  Future visit scheduled: yes  Notes to clinic:  Unable to refill per protocol, last refill by another provider.      Requested Prescriptions  Pending Prescriptions Disp Refills   atorvastatin (LIPITOR) 80 MG tablet 90 tablet 0    Sig: Take 1 tablet (80 mg total) by mouth daily.     Cardiovascular:  Antilipid - Statins Failed - 12/17/2022 11:40 AM      Failed - Lipid Panel in normal range within the last 12 months    Cholesterol, Total  Date Value Ref Range Status  06/09/2016 158 100 - 199 mg/dL Final   Cholesterol  Date Value Ref Range Status  07/19/2022 92 <200 mg/dL Final   LDL Cholesterol (Calc)  Date Value Ref Range Status  07/19/2022 41 mg/dL (calc) Final    Comment:    Reference range: <100 . Desirable range <100 mg/dL for primary prevention;   <70 mg/dL for patients with CHD or diabetic patients  with > or = 2 CHD risk factors. Marland Kitchen LDL-C is now calculated using the Martin-Hopkins  calculation, which is a validated novel method providing  better accuracy than the Friedewald equation in the  estimation of LDL-C.  Horald Pollen et al. Lenox Ahr. 8657;846(96): 2061-2068  (http://education.[facility].com/faq/FAQ164)    HDL  Date Value Ref Range Status  07/19/2022 37 (L) > OR = 40 mg/dL Final  29/52/8413 46 >24 mg/dL Final   Triglycerides  Date Value Ref Range Status  07/19/2022 55 <150 mg/dL Final         Passed - Patient is not pregnant      Passed - Valid encounter within last 12 months    Recent Outpatient Visits           1 month ago Essential hypertension   [place] [facility] Delles, Gentry Fitz A, RPH-CPP   1 month ago Gastroesophageal reflux disease without esophagitis   [place] [facility] Smitty Cords, DO   3 months ago Essential hypertension   [facility] Delles, Jackelyn Poling, RPH-CPP   4 months ago Annual physical exam   [place] [facility], Netta Neat, DO   5 months ago DM (diabetes mellitus), type 2 with peripheral vascular complications [facility])   [place] [facility] [place], Netta Neat, DO       Future Appointments             In 1 month Althea Charon, Netta Neat, DO [place], [facility]   In 1 month Charlsie Quest, NP [place] [facility] [place]

## 2022-12-17 NOTE — Telephone Encounter (Signed)
Medication Refill - Medication: atorvastatin (LIPITOR) 80 MG tablet [[id number]]   Has the patient contacted their pharmacy? Yes.   (Agent: If no, request that the patient contact the pharmacy for the refill. If patient does not wish to contact the pharmacy document the reason why and proceed with request.) (Agent: If yes, when and what did the pharmacy advise?)  Preferred Pharmacy (with phone number or street name):  [facility] [address] [place]), [place] - [address] Phone: [phone]  Fax: [phone]     Has the patient been seen for an appointment in the last year OR does the patient have an upcoming appointment? Yes.    Agent: Please be advised that RX refills may take up to 3 business days. We ask that you follow-up with your pharmacy.

## 2022-12-27 ENCOUNTER — Other Ambulatory Visit: Payer: Self-pay | Admitting: Family Medicine

## 2022-12-27 DIAGNOSIS — F321 Major depressive disorder, single episode, moderate: Secondary | ICD-10-CM

## 2022-12-28 NOTE — Telephone Encounter (Signed)
Requested Prescriptions  Pending Prescriptions Disp Refills   sertraline (ZOLOFT) 50 MG tablet [Pharmacy Med Name: Sertraline HCl 50 MG Oral Tablet] 90 tablet 0    Sig: Take 1 tablet by mouth once daily     Psychiatry:  Antidepressants - SSRI - sertraline Passed - 12/27/2022  9:48 AM      Passed - AST in normal range and within 360 days    AST  Date Value Ref Range Status  07/19/2022 19 10 - 35 U/L Final         Passed - ALT in normal range and within 360 days    ALT  Date Value Ref Range Status  07/19/2022 19 9 - 46 U/L Final         Passed - Completed PHQ-2 or PHQ-9 in the last 360 days      Passed - Valid encounter within last 6 months    Recent Outpatient Visits           1 month ago Essential hypertension   [place] [facility] Delles, Gentry Fitz A, RPH-CPP   1 month ago Gastroesophageal reflux disease without esophagitis   [place] [facility] [place], Netta Neat, DO   4 months ago Essential hypertension   [place] [facility] Delles, Jackelyn Poling, RPH-CPP   5 months ago Annual physical exam   [place] [facility], Netta Neat, DO   5 months ago DM (diabetes mellitus), type 2 with peripheral vascular complications [facility])   [place] [facility] [place], Netta Neat, DO       Future Appointments             In 1 month Althea Charon, Netta Neat, DO [facility], [facility]   In 1 month Charlsie Quest, NP [place] [facility] [place]

## 2023-01-12 ENCOUNTER — Other Ambulatory Visit: Payer: Self-pay | Admitting: Family Medicine

## 2023-01-12 DIAGNOSIS — I2510 Atherosclerotic heart disease of native coronary artery without angina pectoris: Secondary | ICD-10-CM

## 2023-01-12 DIAGNOSIS — E785 Hyperlipidemia, unspecified: Secondary | ICD-10-CM

## 2023-01-12 DIAGNOSIS — K219 Gastro-esophageal reflux disease without esophagitis: Secondary | ICD-10-CM

## 2023-01-12 DIAGNOSIS — I10 Essential (primary) hypertension: Secondary | ICD-10-CM

## 2023-01-13 NOTE — Telephone Encounter (Signed)
Requested Prescriptions  Pending Prescriptions Disp Refills   isosorbide mononitrate (IMDUR) 60 MG 24 hr tablet [Pharmacy Med Name: Isosorbide Mononitrate ER 60 MG Oral Tablet Extended Release 24 Hour] 90 tablet 0    Sig: Take 1 tablet by mouth once daily     Cardiovascular:  Nitrates Passed - 01/12/2023  2:45 PM      Passed - Last BP in normal range    BP Readings from Last 1 Encounters:  11/02/22 120/70         Passed - Last Heart Rate in normal range    Pulse Readings from Last 1 Encounters:  11/02/22 73         Passed - Valid encounter within last 12 months    Recent Outpatient Visits           2 months ago Essential hypertension   [place] [facility] Delles, Gentry Fitz A, RPH-CPP   2 months ago Gastroesophageal reflux disease without esophagitis   [place] [facility] [place], Netta Neat, DO   4 months ago Essential hypertension   [place] [facility] Delles, Jackelyn Poling, RPH-CPP   5 months ago Annual physical exam   [place] [facility] [place], Netta Neat, DO   6 months ago DM (diabetes mellitus), type 2 with peripheral vascular complications [facility])   [place] [facility] [place], Netta Neat, DO       Future Appointments             In 2 weeks Althea Charon, Netta Neat, DO [place] [facility], [facility]   In 3 weeks Charlsie Quest, NP  [facility] at [facility]             ezetimibe (ZETIA) 10 MG tablet [Pharmacy Med Name: Ezetimibe 10 MG Oral Tablet] 90 tablet 0    Sig: Take 1 tablet by mouth once daily     Cardiovascular:  Antilipid - Sterol Transport Inhibitors Failed - 01/12/2023  2:45 PM      Failed - Lipid Panel in normal range within the last 12 months    Cholesterol, Total  Date Value Ref Range Status  06/09/2016 158 100 - 199 mg/dL Final   Cholesterol  Date Value Ref Range Status  07/19/2022 92 <200 mg/dL Final    LDL Cholesterol (Calc)  Date Value Ref Range Status  07/19/2022 41 mg/dL (calc) Final    Comment:    Reference range: <100 . Desirable range <100 mg/dL for primary prevention;   <70 mg/dL for patients with CHD or diabetic patients  with > or = 2 CHD risk factors. Marland Kitchen LDL-C is now calculated using the Martin-Hopkins  calculation, which is a validated novel method providing  better accuracy than the Friedewald equation in the  estimation of LDL-C.  Horald Pollen et al. Lenox Ahr. 4403;474(25): 2061-2068  (http://education.[facility].com/faq/FAQ164)    HDL  Date Value Ref Range Status  07/19/2022 37 (L) > OR = 40 mg/dL Final  95/63/8756 46 >43 mg/dL Final   Triglycerides  Date Value Ref Range Status  07/19/2022 55 <150 mg/dL Final         Passed - AST in normal range and within 360 days    AST  Date Value Ref Range Status  07/19/2022 19 10 - 35 U/L Final         Passed - ALT in normal range and within 360 days    ALT  Date Value Ref  Range Status  07/19/2022 19 9 - 46 U/L Final         Passed - Patient is not pregnant      Passed - Valid encounter within last 12 months    Recent Outpatient Visits           2 months ago Essential hypertension   [place] [facility] Delles, Gentry Fitz A, RPH-CPP   2 months ago Gastroesophageal reflux disease without esophagitis   [facility] Smitty Cords, DO   4 months ago Essential hypertension   [place] [facility] Delles, Jackelyn Poling, RPH-CPP   5 months ago Annual physical exam   [facility] [place], Netta Neat, DO   6 months ago DM (diabetes mellitus), type 2 with peripheral vascular complications [facility])   [place] [facility] Althea Charon, Netta Neat, DO       Future Appointments             In 2 weeks Althea Charon, Netta Neat, DO [place] [facility], [facility]   In 3 weeks  Charlsie Quest, NP [place] [facility]             pantoprazole (PROTONIX) 40 MG tablet [Pharmacy Med Name: Pantoprazole Sodium 40 MG Oral Tablet Delayed Release] 90 tablet 0    Sig: TAKE 1 TABLET BY MOUTH ONCE DAILY BEFORE BREAKFAST     Gastroenterology: Proton Pump Inhibitors Passed - 01/12/2023  2:45 PM      Passed - Valid encounter within last 12 months    Recent Outpatient Visits           2 months ago Essential hypertension   [place] [facility] Delles, Gentry Fitz A, RPH-CPP   2 months ago Gastroesophageal reflux disease without esophagitis   [place] [facility] Smitty Cords, DO   4 months ago Essential hypertension   [place] [facility] D[facility], Jackelyn Poling, RPH-CPP   5 months ago Annual physical exam   [facility] [place], Netta Neat, DO   6 months ago DM (diabetes mellitus), type 2 with peripheral vascular complications [facility])   [place] [place], Netta Neat, DO       Future Appointments             In 2 weeks Althea Charon, Netta Neat, DO [place], [facility]   In 3 weeks Charlsie Quest, NP [facility] [place]

## 2023-01-28 ENCOUNTER — Other Ambulatory Visit: Payer: Self-pay | Admitting: Family Medicine

## 2023-01-28 DIAGNOSIS — E1151 Type 2 diabetes mellitus with diabetic peripheral angiopathy without gangrene: Secondary | ICD-10-CM

## 2023-01-28 NOTE — Telephone Encounter (Signed)
Requested Prescriptions  Pending Prescriptions Disp Refills   metFORMIN (GLUCOPHAGE) 1000 MG tablet [Pharmacy Med Name: metFORMIN HCl 1000 MG Oral Tablet] 180 tablet 0    Sig: TAKE 1 TABLET BY MOUTH TWICE DAILY WITH A MEAL     Endocrinology:  Diabetes - Biguanides Failed - 01/28/2023 12:53 PM      Failed - HBA1C is between 0 and 7.9 and within 180 days    Hemoglobin A1C  Date Value Ref Range Status  04/18/2017 6.4  Final   Hgb A1c MFr Bld  Date Value Ref Range Status  07/19/2022 5.7 (H) <5.7 % of total Hgb Final    Comment:    For someone without known diabetes, a hemoglobin  A1c value between 5.7% and 6.4% is consistent with prediabetes and should be confirmed with a  follow-up test. . For someone with known diabetes, a value <7% indicates that their diabetes is well controlled. A1c targets should be individualized based on duration of diabetes, age, comorbid conditions, and other considerations. . This assay result is consistent with an increased risk of diabetes. . Currently, no consensus exists regarding use of hemoglobin A1c for diagnosis of diabetes for children. .          Failed - B12 Level in normal range and within 720 days    No results found for: "VITAMINB12"       Passed - Cr in normal range and within 360 days    Creat  Date Value Ref Range Status  07/19/2022 0.76 0.70 - 1.35 mg/dL Final   Creatinine, Urine  Date Value Ref Range Status  07/26/2022 92 20 - 320 mg/dL Final         Passed - eGFR in normal range and within 360 days    GFR, Est African American  Date Value Ref Range Status  07/15/2020 108 > OR = 60 mL/min/1.90m2 Final   GFR, Est Non African American  Date Value Ref Range Status  07/15/2020 93 > OR = 60 mL/min/1.44m2 Final   GFR, Estimated  Date Value Ref Range Status  06/27/2022 >60 >60 mL/min Final    Comment:    (NOTE) Calculated using the CKD-EPI Creatinine Equation (2021)    eGFR  Date Value Ref Range Status  07/19/2022  100 > OR = 60 mL/min/1.32m2 Final         Passed - Valid encounter within last 6 months    Recent Outpatient Visits           2 months ago Essential hypertension   [facility] [facility] Delles, Gentry Fitz A, RPH-CPP   2 months ago Gastroesophageal reflux disease without esophagitis   [facility] [place], Netta Neat, DO   5 months ago Essential hypertension   [place] [facility] Delles, Jackelyn Poling, RPH-CPP   6 months ago Annual physical exam   [place] [facility] [place], Netta Neat, DO   6 months ago DM (diabetes mellitus), type 2 with peripheral vascular complications [facility])   [place] [facility] Althea Charon, Netta Neat, DO       Future Appointments             In 4 days Althea Charon, Netta Neat, DO [place] [facility], [facility]   In 1 week Charlsie Quest, NP  [facility] at [facility] - CBC within normal limits and completed  in the last 12 months    WBC  Date Value Ref Range Status  07/19/2022 3.9 3.8 - 10.8 Thousand/uL Final   RBC  Date Value Ref Range Status  07/19/2022 4.16 (L) 4.20 - 5.80 Million/uL Final   Hemoglobin  Date Value Ref Range Status  07/19/2022 11.7 (L) 13.2 - 17.1 g/dL Final  09/81/1912 78.2 13.0 - 17.7 g/dL Final    Comment:                  **Please note reference interval change**   HCT  Date Value Ref Range Status  07/19/2022 35.6 (L) 38.5 - 50.0 % Final   Hematocrit  Date Value Ref Range Status  06/09/2016 41.5 37.5 - 51.0 % Final   MCHC  Date Value Ref Range Status  07/19/2022 32.9 32.0 - 36.0 g/dL Final   [facility]  Date Value Ref Range Status  07/19/2022 28.1 27.0 - 33.0 pg Final   MCV  Date Value Ref Range Status  07/19/2022 85.6 80.0 - 100.0 fL Final  06/09/2016 83 79 - 97 fL Final   No results found for: "PLTCOUNTKUC", "LABPLAT", "POCPLA" RDW  Date Value Ref Range  Status  07/19/2022 13.1 11.0 - 15.0 % Final  06/09/2016 16.6 (H) 12.3 - 15.4 % Final

## 2023-02-01 ENCOUNTER — Other Ambulatory Visit: Payer: Self-pay | Admitting: Family Medicine

## 2023-02-01 ENCOUNTER — Ambulatory Visit: Payer: Medicaid Other | Admitting: Family Medicine

## 2023-02-01 ENCOUNTER — Encounter: Payer: Self-pay | Admitting: Family Medicine

## 2023-02-01 VITALS — BP 124/72 | HR 74 | Temp 96.8°F | Wt 290.0 lb

## 2023-02-01 DIAGNOSIS — I10 Essential (primary) hypertension: Secondary | ICD-10-CM

## 2023-02-01 DIAGNOSIS — R351 Nocturia: Secondary | ICD-10-CM

## 2023-02-01 DIAGNOSIS — F325 Major depressive disorder, single episode, in full remission: Secondary | ICD-10-CM | POA: Diagnosis not present

## 2023-02-01 DIAGNOSIS — E1169 Type 2 diabetes mellitus with other specified complication: Secondary | ICD-10-CM

## 2023-02-01 DIAGNOSIS — K219 Gastro-esophageal reflux disease without esophagitis: Secondary | ICD-10-CM | POA: Diagnosis not present

## 2023-02-01 DIAGNOSIS — E1151 Type 2 diabetes mellitus with diabetic peripheral angiopathy without gangrene: Secondary | ICD-10-CM | POA: Diagnosis not present

## 2023-02-01 DIAGNOSIS — I2510 Atherosclerotic heart disease of native coronary artery without angina pectoris: Secondary | ICD-10-CM

## 2023-02-01 DIAGNOSIS — Z0000 Encounter for general adult medical examination without abnormal findings: Secondary | ICD-10-CM

## 2023-02-01 LAB — POCT GLYCOSYLATED HEMOGLOBIN (HGB A1C): Hemoglobin A1C: 6 % — AB (ref 4.0–5.6)

## 2023-02-01 MED ORDER — PANTOPRAZOLE SODIUM 40 MG PO TBEC
40.0000 mg | DELAYED_RELEASE_TABLET | Freq: Every day | ORAL | 3 refills | Status: DC
Start: 2023-02-01 — End: 2024-02-01

## 2023-02-01 MED ORDER — SERTRALINE HCL 50 MG PO TABS: 50.0000 mg | ORAL_TABLET | Freq: Every day | ORAL | 3 refills | Status: DC

## 2023-02-01 NOTE — Progress Notes (Signed)
Subjective:    Patient ID: Craig Harvey, male    DOB: 20-Sep-1955, 67 y.o.   MRN: [id number]  Craig Harvey is a 67 y.o. male presenting on 02/01/2023 for Diabetes   HPI  CHRONIC DM, Type 2: A1c today at 6.0 overall very stable Failed Trulicity due to side effect intolerance Stable CBGs Meds: Metformin 1000mg  BID, Humalog Vials 75/25 - currently at 17u BID wc Asks for larger supply of insulin and if he can lower dose gradually Reports good compliance. Tolerating well w/o side-effects Currently on ACEi Lifestyle: - Diet (trying to improve diet - eliminated french fries, chocolate) UTD DM eye No significant hypoglycemia   Depression, moderate, single episode, remission Improved control on SSRI Sertraline   Hypertension History of Unstable angina s/p PCI stent Syncopal episode CAD / Vascular Disease / S/p CABG  He admits some chronic chest wall discomfort after CABG. Suspects it to be permanent injury.  IMproved BP control Medications current - Amlodipine 5mg  daily, Metoprolol 25mg  XL Daily, Valsartan 320mg  daily  He has risk factors with DM2, HYPERTENSION, HLD, OSA on CPAP   Followed by [facility]iology Dr Mariah Milling. history of CABG 01/2015      02/01/2023    4:13 PM 11/02/2022    3:19 PM 07/26/2022    1:50 PM  Depression screen PHQ 2/9  Decreased Interest 0 0 2  Down, Depressed, Hopeless 0 0 2  PHQ - 2 Score 0 0 4  Altered sleeping 0 0 1  Tired, decreased energy 0 0 1  Change in appetite 0 0 0  Feeling bad or failure about yourself  1 0 1  Trouble concentrating 0 0 1  Moving slowly or fidgety/restless 0 0 0  Suicidal thoughts 0 0 0  PHQ-9 Score 1 0 8  Difficult doing work/chores Not difficult at all  Somewhat difficult    Social History   Tobacco Use   Smoking status: Former    Current packs/day: 0.00    Types: Cigarettes    Quit date: 08/06/1975    Years since quitting: 47.5   Smokeless tobacco: Never  Vaping Use   Vaping status: Never Used  Substance  Use Topics   Alcohol use: No   Drug use: No    Review of Systems Per HPI unless specifically indicated above     Objective:    BP 124/72 (BP Location: Right Arm, Patient Position: Sitting, Cuff Size: Large)   Pulse 74   Temp (!) 96.8 F (36 C) (Temporal)   Wt 290 lb (131.5 kg)   SpO2 95%   BMI 45.42 kg/m   Wt Readings from Last 3 Encounters:  02/01/23 290 lb (131.5 kg)  11/02/22 276 lb (125.2 kg)  10/04/22 276 lb (125.2 kg)    Physical Exam Vitals and nursing note reviewed.  Constitutional:      General: He is not in acute distress.    Appearance: He is well-developed. He is obese. He is not diaphoretic.     Comments: Well-appearing, comfortable, cooperative  HENT:     Head: Normocephalic and atraumatic.  Eyes:     General:        Right eye: No discharge.        Left eye: No discharge.     Conjunctiva/sclera: Conjunctivae normal.  Neck:     Thyroid: No thyromegaly.  Cardiovascular:     Rate and Rhythm: Normal rate and regular rhythm.     Pulses: Normal pulses.     Heart  sounds: Normal heart sounds. No murmur heard. Pulmonary:     Effort: Pulmonary effort is normal. No respiratory distress.     Breath sounds: Normal breath sounds. No wheezing or rales.  Musculoskeletal:        General: Normal range of motion.     Cervical back: Normal range of motion and neck supple.  Lymphadenopathy:     Cervical: No cervical adenopathy.  Skin:    General: Skin is warm and dry.     Findings: No erythema or rash.  Neurological:     Mental Status: He is alert and oriented to person, place, and time. Mental status is at baseline.  Psychiatric:        Behavior: Behavior normal.     Comments: Well groomed, good eye contact, normal speech and thoughts     Recent Labs    07/19/22 0759 02/01/23 1551  HGBA1C 5.7* 6.0*    Results for orders placed or performed in visit on 02/01/23  POCT glycosylated hemoglobin (Hb A1C)  Result Value Ref Range   Hemoglobin A1C 6.0 (A) 4.0  - 5.6 %      Assessment & Plan:   Problem List Items Addressed This Visit     DM (diabetes mellitus), type 2 with peripheral vascular complications (HCC) - Primary    Well controlled DM A1c 6.0 Complications - vascular disease including CAD s/p CABG, PAD, among other including hyperlipidemia, morbid obesity, and OSA - increases risk of future cardiovascular complications and poor glucose control OFF Trulicity (intolerance)  Plan:  1. Continue current therapy - insulin therapy 17 u TWICE A DAY and metformin 2. Encourage improved lifestyle - low carb, low sugar diet, reduce portion size, continue improving regular exercise 3. Check CBG , bring log to next visit for review 4. Continue ASA, ARB, Statin      Relevant Orders   POCT glycosylated hemoglobin (Hb A1C) (Completed)   Essential hypertension    Controlled BP Home BP readings reviewed No known complications    Plan:  1. Continue current BP regimen Amlodipine 5mg  daily, Metoprolol XL 25mg  daily, Valsartan 320mg  daily 2. Encourage improved lifestyle - low sodium diet, regular exercise 3. Continue monitor BP outside office, bring readings to next visit, if persistently >140/90 or new symptoms notify office sooner      Gastroesophageal reflux disease without esophagitis    Re order Pantoprazole 40mg  daily      Relevant Medications   pantoprazole (PROTONIX) 40 MG tablet   Major depression in remission (HCC)    Controlled, in remission On Sertraline 50mg       Relevant Medications   sertraline (ZOLOFT) 50 MG tablet    Meds ordered this encounter  Medications   pantoprazole (PROTONIX) 40 MG tablet    Sig: Take 1 tablet (40 mg total) by mouth daily before breakfast.    Dispense:  90 tablet    Refill:  3    Add refills   sertraline (ZOLOFT) 50 MG tablet    Sig: Take 1 tablet (50 mg total) by mouth daily.    Dispense:  90 tablet    Refill:  3    Add refills      Follow up plan: Return in about 6 months (around  08/04/2023) for 6 month fasting lab only then 1 week later Annual Physical after apt, Weds preferred.  Future labs ordered for 07/27/23   Saralyn Pilar, DO [facility] 02/01/2023, 3:53 PM

## 2023-02-01 NOTE — Assessment & Plan Note (Signed)
Re order Pantoprazole 40mg  daily

## 2023-02-01 NOTE — Assessment & Plan Note (Signed)
Controlled BP Home BP readings reviewed No known complications    Plan:  1. Continue current BP regimen Amlodipine 5mg  daily, Metoprolol XL 25mg  daily, Valsartan 320mg  daily 2. Encourage improved lifestyle - low sodium diet, regular exercise 3. Continue monitor BP outside office, bring readings to next visit, if persistently >140/90 or new symptoms notify office sooner

## 2023-02-01 NOTE — Assessment & Plan Note (Addendum)
Well controlled DM A1c 6.0 Complications - vascular disease including CAD s/p CABG, PAD, among other including hyperlipidemia, morbid obesity, and OSA - increases risk of future cardiovascular complications and poor glucose control OFF Trulicity (intolerance)  Plan:  1. Continue current therapy - insulin therapy 17 u TWICE A DAY and metformin 2. Encourage improved lifestyle - low carb, low sugar diet, reduce portion size, continue improving regular exercise 3. Check CBG , bring log to next visit for review 4. Continue ASA, ARB, Statin

## 2023-02-01 NOTE — Assessment & Plan Note (Signed)
Controlled, in remission On Sertraline 50mg 

## 2023-02-01 NOTE — Patient Instructions (Addendum)
Thank you for coming to the office today.  Recent Labs    07/19/22 0759 02/01/23 1551  HGBA1C 5.7* 6.0*   Blood pressure is very good today.  A1c is right where it needs to be  Keep on current regimen.  Refills sent to pharmacy. Try to pick up the stomach acid medicine this time, rx Pantoprazole was resent.  DUE for FASTING BLOOD WORK (no food or drink after midnight before the lab appointment, only water or coffee without cream/sugar on the morning of)  SCHEDULE "Lab Only" visit in the morning at the clinic for lab draw in  6 MONTHS   - Make sure Lab Only appointment is at about 1 week before your next appointment, so that results will be available  For Lab Results, once available within 2-3 days of blood draw, you can can log in to MyChart online to view your results and a brief explanation. Also, we can discuss results at next follow-up visit.   Please schedule a Follow-up Appointment to: Return in about 6 months (around 08/04/2023) for 6 month fasting lab only then 1 week later Annual Physical after apt, Weds preferred.  If you have any other questions or concerns, please feel free to call the office or send a message through MyChart. You may also schedule an earlier appointment if necessary.  Additionally, you may be receiving a survey about your experience at our office within a few days to 1 week by e-mail or mail. We value your feedback.  Saralyn Pilar, DO [facility], [place]

## 2023-02-08 ENCOUNTER — Encounter: Payer: Self-pay | Admitting: Cardiology

## 2023-02-08 ENCOUNTER — Ambulatory Visit: Payer: Medicaid Other | Attending: Cardiology | Admitting: Cardiology

## 2023-02-08 VITALS — BP 106/70 | HR 70 | Ht 67.0 in | Wt 289.8 lb

## 2023-02-08 DIAGNOSIS — E1159 Type 2 diabetes mellitus with other circulatory complications: Secondary | ICD-10-CM | POA: Diagnosis not present

## 2023-02-08 DIAGNOSIS — I2510 Atherosclerotic heart disease of native coronary artery without angina pectoris: Secondary | ICD-10-CM

## 2023-02-08 DIAGNOSIS — G4733 Obstructive sleep apnea (adult) (pediatric): Secondary | ICD-10-CM | POA: Diagnosis not present

## 2023-02-08 DIAGNOSIS — Z794 Long term (current) use of insulin: Secondary | ICD-10-CM | POA: Diagnosis not present

## 2023-02-08 DIAGNOSIS — I10 Essential (primary) hypertension: Secondary | ICD-10-CM

## 2023-02-08 DIAGNOSIS — E785 Hyperlipidemia, unspecified: Secondary | ICD-10-CM

## 2023-02-08 DIAGNOSIS — Z951 Presence of aortocoronary bypass graft: Secondary | ICD-10-CM | POA: Diagnosis not present

## 2023-02-08 MED ORDER — CLOPIDOGREL BISULFATE 75 MG PO TABS
75.0000 mg | ORAL_TABLET | Freq: Every day | ORAL | 3 refills | Status: DC
Start: 2023-02-08 — End: 2024-02-10

## 2023-02-08 NOTE — Patient Instructions (Signed)
Medication Instructions:  Your physician recommends that you continue on your current medications as directed. Please refer to the Current Medication list given to you today.  *If you need a refill on your cardiac medications before your next appointment, please call your pharmacy*   Lab Work: none If you have labs (blood work) drawn today and your tests are completely normal, you will receive your results only by: MyChart Message (if you have MyChart) OR A paper copy in the mail If you have any lab test that is abnormal or we need to change your treatment, we will call you to review the results.   Testing/Procedures: none   Follow-Up: At [facility], you and your health needs are our priority.  As part of our continuing mission to provide you with exceptional heart care, we have created designated Provider Care Teams.  These Care Teams include your primary Cardiologist (physician) and Advanced Practice Providers (APPs -  Physician Assistants and Nurse Practitioners) who all work together to provide you with the care you need, when you need it.  We recommend signing up for the patient portal called "MyChart".  Sign up information is provided on this After Visit Summary.  MyChart is used to connect with patients for Virtual Visits (Telemedicine).  Patients are able to view lab/test results, encounter notes, upcoming appointments, etc.  Non-urgent messages can be sent to your provider as well.   To learn more about what you can do with MyChart, go to [email or web address].    Your next appointment:   6  month(s)  Provider:   You may see Julien Nordmann, MD or one of the following Advanced Practice Providers on your designated Care Team:   Charlsie Quest, NP

## 2023-02-08 NOTE — Progress Notes (Signed)
Cardiology Office Note:  .   Date:  02/08/2023  ID:  Craig Harvey, DOB 07-16-1955, MRN [id number] PCP: Smitty Cords, DO  [facility] Providers Cardiologist:  Julien Nordmann, MD    History of Present Illness: .   Craig Harvey is a 67 y.o. male with a history of coronary artery disease status post CABG x 3 vessels (01/2015) with PCI/DES to proximal RCA (6/23), postoperative atrial fibrillation, hypertension, hyperlipidemia, obesity, OSA on CPAP, type 2 diabetes, presents today for follow-up of CAD and hypertension.  Previously admitted to the hospital on 01/2015 ruled out for MI.  Echo revealed EF 60 to 65%, no RWMA, and no significant valve abnormalities.  Lexiscan MPI showed large defect with moderate severity.  Diagnostic left heart catheterization demonstrated severe two-vessel CAD and he was transferred to [facility] where he underwent three-vessel CABG (LIMA-LAD, SVG-OM, and SVG-ramus).  Postoperative course was complicated by atrial fibrillation requiring IV amiodarone.  In 12/2021 he was seen for unstable angina.  Echo revealed EF 60 to 65%, no RWMA, G1 DD, no valvular disease.  He was discharged home the same day.  He underwent a left heart catheterization Which demonstrated severe native vessel disease with 3 patent grafts, occlusion noted to SVG-ramus, culprit lesion was 95% focal PAD RCA stenosis with successful PCI/DES.  Return to facility 04/12/2022 with chest pain and labile blood pressure.  Lexiscan MPI revealed no evidence of ischemia was considered low risk.  Last seen in clinic on 10/04/2022 states he been doing fairly well.  He had been compliant with his medication and denied any recent hospitalizations or visits to the emergency department.  He returns to clinic today stating that overall he has been doing well from a cardiac perspective.  He denies any chest pain or shortness of breath.  Continues to try to work on being more compliant with his CPAP.  He states  unfortunately with a change in the weather he has been having issues with sneezing and has not been able to wear his mask throughout the night.  Suggested possible mild antihistamine before bed.  Stated that he feels that his A1c recently done and it is improved but he had been having an increased amount of cookies as of late.  Denies any hospitalizations or visits to the emergency department.  ROS: 10 point review of systems have been reviewed and considered negative with exception of what is been listed in the HPI  Studies Reviewed: Marland Kitchen   EKG Interpretation Date/Time:  Tuesday February 08 2023 13:16:19 EDT Ventricular Rate:  70 PR Interval:  246 QRS Duration:  82 QT Interval:  366 QTC Calculation: 395 R Axis:   -7  Text Interpretation: Sinus rhythm with 1st degree A-V block Nonspecific T wave abnormality When compared with ECG of 27-Jun-2022 23:49, Criteria for Anterior infarct are no longer Present Nonspecific T wave abnormality no longer evident in Inferior leads Confirmed by Charlsie Quest ([id number]) on 02/08/2023 1:28:33 PM    TTE 09/10/22 1. Left ventricular ejection fraction, by estimation, is 60 to 65%. The  left ventricle has normal function. The left ventricle has no regional  wall motion abnormalities. There is moderate asymmetric left ventricular  hypertrophy of the basal-septal  segment. Left ventricular diastolic parameters are consistent with Grade I  diastolic dysfunction (impaired relaxation).   2. Right ventricular systolic function is normal. The right ventricular  size is normal. There is normal pulmonary artery systolic pressure. The  estimated right ventricular systolic pressure is 26.2  mmHg.   3. Left atrial size was mild to moderately dilated.   4. The mitral valve is normal in structure. Mild mitral valve  regurgitation. No evidence of mitral stenosis.   5. The aortic valve has an indeterminant number of cusps, unable to  exclude bicuspid aortic valve. Aortic valve  regurgitation is not  visualized. No aortic stenosis is present.   6. There is mild dilatation of the aortic root, measuring 40 mm. There is  borderline dilatation of the ascending aorta, measuring 36 mm.   7. The inferior vena cava is normal in size with greater than 50%  respiratory variability, suggesting right atrial pressure of 3 mmHg.   Lexiscan MPI 04/21/22   There is no evidence for ischemia. The study is low risk.   LV perfusion is normal.   Left ventricular function is normal visually. Calculated EF 42%. correlation with echo advised.   Three vessel coronary calcification noted. Risk Assessment/Calculations:             Physical Exam:   VS:  BP 106/70 (BP Location: Left Arm, Patient Position: Sitting, Cuff Size: Large)   Pulse 70   Ht 5\' 7"  (1.702 m)   Wt 289 lb 12.8 oz (131.5 kg)   SpO2 98%   BMI 45.39 kg/m    Wt Readings from Last 3 Encounters:  02/08/23 289 lb 12.8 oz (131.5 kg)  02/01/23 290 lb (131.5 kg)  11/02/22 276 lb (125.2 kg)    GEN: Well nourished, well developed in no acute distress NECK: No JVD; No carotid bruits CARDIAC: RRR, no murmurs, rubs, gallops RESPIRATORY:  Clear to auscultation without rales, wheezing or rhonchi  ABDOMEN: Soft, non-tender, non-distended EXTREMITIES:  No edema; No deformity   ASSESSMENT AND PLAN: .   Coronary artery disease status post CABG without angina.  States that he has been doing well from a cardiac perspective.  Last Lexiscan MPI was low risk with no ischemia noted.  EKG that was completed today revealed sinus rhythm with first-degree AV block with nonspecific T waves that are unchanged from prior studies.  He is continued on aspirin, atorvastatin, Zetia, Imdur, and clopidogrel.  He also has Nitrostat as needed for chest discomfort which has not had to use.  Primary hypertension with a blood pressure 106/70.  Blood pressure remained stable.  He is continued on amlodipine 5 mg daily, Imdur 60 mg daily, Toprol-XL 25 mg  daily and valsartan 320 mg daily.  No good he was also encouraged to continue to monitor his blood pressures 1 to 2 hours at home.  He is also following up with pharmacy for continued management of his hypertension and diabetes as requested by his PCP.  He stated this morning when he took his blood pressure at home he was systolic 126.  Mixed hyperlipidemia with an LDL 41 07/2022.  He has remained at goal.  He has a future blood work scheduled with his PCP.  He is continued on atorvastatin and ezetimibe.   Type 2 diabetes with slightly increased A1c.  He has been continued on his current medications his diabetes continues to be managed by his PCP  Obstructive sleep apnea with questionable compliance to his CPAP.  He has been encouraged to continue with compliance.  Morbid obesity with a BMI of 45.3 with increased activity and decrease caloric intake encouraged.       Dispo: Patient to return to clinic to see MD/APP in 6 months or sooner if needed  Signed, Navi Erber,  NP

## 2023-03-11 ENCOUNTER — Ambulatory Visit: Payer: Medicaid Other | Admitting: Pharmacist

## 2023-03-11 DIAGNOSIS — E1151 Type 2 diabetes mellitus with diabetic peripheral angiopathy without gangrene: Secondary | ICD-10-CM

## 2023-03-11 DIAGNOSIS — I10 Essential (primary) hypertension: Secondary | ICD-10-CM

## 2023-03-11 NOTE — Patient Instructions (Signed)
Goals Addressed             This Visit's Progress    Pharmacy Goals       Our goal A1c is less than 7%. This corresponds with fasting sugars less than 130 and 2 hour after meal sugars less than 180. Please keep a log of your results when checking your blood sugar   Our goal bad cholesterol, or LDL, is less than 70 . This is why it is important to continue taking your atorvastatin and ezetimibe.  Check your blood pressure daily, and any time you have concerning symptoms like headache, chest pain, dizziness, shortness of breath, or vision changes.   Our goal is less than 130/80.  To appropriately check your blood pressure, make sure you do the following:  1) Avoid caffeine, exercise, or tobacco products for 30 minutes before checking. Empty your bladder. 2) Sit with your back supported in a flat-backed chair. Rest your arm on something flat (arm of the chair, table, etc). 3) Sit still with your feet flat on the floor, resting, for at least 5 minutes.  4) Check your blood pressure. Take 1-2 readings.  5) Write down these readings and bring with you to any provider appointments.  Bring your home blood pressure machine with you to a provider's office for accuracy comparison at least once a year.   Make sure you take your blood pressure medications before you come to any office visit, even if you were asked to fast for labs.   Elisabeth Delles, PharmD, BCACP, CPP Clinical Pharmacist [facility] [place] [phone]         

## 2023-03-11 NOTE — Progress Notes (Signed)
03/11/2023 Name: Craig Harvey MRN: [id number] DOB: 08-Nov-1955  Chief Complaint  Patient presents with   Medication Assistance   Medication Management    Craig Harvey is a 67 y.o. year old male who presented for a telephone visit.   They were referred to the pharmacist by their PCP for assistance in managing diabetes and hypertension.    Patient is participating in a Managed Medicaid Plan:  Yes, Engineer, civil (consulting)     Subjective:  Care Team: Primary Care Provider: Smitty Cords, DO; Next Scheduled Visit: 08/03/2023 Cardiologist: [facility] [place]  Medication Access/Adherence  Current Pharmacy:  [facility] - [place], [place] - [address] [place] [address] Phone: [phone] Fax: [phone]  [facility] - [address]), [place] - [address] [place]) [place] [address] Phone: [phone] Fax: [phone]   Patient reports affordability concerns with their medications: No  Patient reports access/transportation concerns to their pharmacy: No  Patient reports adherence concerns with their medications:  No     Uses weekly pillbox; denies missed doses   Shares that he recently signed up for Medicare and plans to have a Dual Complete Advantage plan through [employer] starting 06/05/2023, but that he does not yet have the card for this plan   Hypertension:   Patient followed by [facility]    Current medications:  - amlodipine 5 mg daily - metoprolol ER 25 mg daily - valsartan 320 mg daily - isosorbide ER 60 mg daily   Previous therapies tried: lisinopril, HCTZ (discontinued 12/2021 related to hyponatremia); amlodipine   Reports uses weekly pillbox to organize his medications; denies missed doses   Home Monitoring: Patient has an automated upper arm home BP machine Counsel on BP monitoring technique Current blood pressure  readings:  - Today: 137/67, HR 66   Per patient and provider, patient with history of labile blood pressure   Current physical activity: has increased to walking or riding bike ~30 minutes x 4 days/week   OSA/CPAP: Reports using CPAP   Limiting salt/sodium in his diet. Reviews nutrition labels for sodium content     Diabetes:   Current medications:  - Humalog Mix 75/25 - Injects 17 units twice daily with meals - metformin 1000 mg - 1/2 tablet (500 mg) twice daily   Current glucose readings: last checked this morning, reading: 114   Denies recent symptoms of hypoglycemia   Confirms has glucose tablets   Current physical activity: has increased to walking or riding bike ~30 minutes x 4 days/week     Hyperlipidemia/ASCVD Risk Reduction   Current lipid lowering medications: atorvastatin 80 mg; ezetimibe 10 mg daily Medications tried in the past:    Antiplatelet regimen: aspirin 81 mg; clopidogrel 75 mg daily   ASCVD History: CAD s/p CABG x 05 January 2015 and PCI with balloon angioplasty and DES proximal RCA June 2023  Risk Factors: hypertension, hyperlipidemia, obesity, OSA on CPAP, T2DM    Current physical activity: has increased to walking or riding bike ~30 minutes x 4 days/week   Health Maintenance - Discussed CDC/ACIP recommendations for Shingrix and Tetanus vaccines Reports he received tetanus vaccination from [facility] Received 1st Shingles vaccine and scheduled for 2nd dose Follow up with [facility] on behalf of patient today. Pharmacy advises patient received both 1st dose of Shingrix vaccine and Prevnar-20 on 01/18/2023, but has not received Tetanus vaccination from their pharmacy  Objective:  Lab Results  Component Value Date   HGBA1C 6.0 (A) 02/01/2023    Lab Results  Component Value Date   CREATININE 0.76 07/19/2022   BUN 11 07/19/2022   NA 136 07/19/2022   K 4.3 07/19/2022   CL 101 07/19/2022   CO2 25 07/19/2022    Lab Results   Component Value Date   CHOL 92 07/19/2022   HDL 37 (L) 07/19/2022   LDLCALC 41 07/19/2022   TRIG 55 07/19/2022   CHOLHDL 2.5 07/19/2022   BP Readings from Last 3 Encounters:  02/08/23 106/70  02/01/23 124/72  11/02/22 120/70   Pulse Readings from Last 3 Encounters:  02/08/23 70  02/01/23 74  11/02/22 73     Medications Reviewed Today     Reviewed by Manuela Neptune, RPH-CPP (Pharmacist) on 03/11/23 at 1522  Med List Status: <None>   Medication Order Taking? Sig Documenting Provider Last Dose Status Informant  Accu-Chek Softclix Lancets lancets [id number]  Use to check blood sugar up to 3 x daily Smitty Cords, DO  Active   amLODipine (NORVASC) 5 MG tablet [id number] Yes TAKE 1 TABLET BY MOUTH ONCE DAILY AT  5PM Antonieta Iba, MD Taking Active   aspirin EC 81 MG tablet [id number]  Take 1 tablet (81 mg total) by mouth daily. Antonieta Iba, MD  Active Pharmacy Records, Self  atorvastatin (LIPITOR) 80 MG tablet [id number]  Take 1 tablet (80 mg total) by mouth daily. Althea Charon, Netta Neat, DO  Active   baclofen (LIORESAL) 10 MG tablet [id number]  TAKE ONE-HALF TO ONE TABLET BY MOUTH TWICE DAILY AS NEEDED FOR MUSCLE SPASM (LEG PAIN) Smitty Cords, DO  Active   Blood Glucose Monitoring Suppl (ACCU-CHEK GUIDE ME) w/Device KIT [id number]  Use to check blood sugar up to 3 x daily Smitty Cords, DO  Active   clopidogrel (PLAVIX) 75 MG tablet [id number]  Take 1 tablet (75 mg total) by mouth daily. Charlsie Quest, NP  Active   docusate sodium (COLACE) 100 MG capsule [id number]  Take 100 mg by mouth 2 (two) times daily. [provider]  Active   ezetimibe (ZETIA) 10 MG tablet [id number]  Take 1 tablet by mouth once daily Karamalegos, Netta Neat, DO  Active   glucose blood (ACCU-CHEK GUIDE) test strip [id number]  Use to check blood sugar up to 3 x daily Karamalegos, Netta Neat, DO  Active   insulin lispro protamine-lispro (HUMALOG MIX 75/25)  (75-25) 100 UNIT/ML SUSP injection [id number] Yes INJECT 17 UNITS UNDER THE SKIN TWICE DAILY WITH A MEAL. MAX OF 50 UNITS DAILY. Smitty Cords, DO Taking Active   Insulin Syringe-Needle U-100 (BD VEO INSULIN SYRINGE U/F) 31G X 15/64" 0.3 ML MISC [id number]  USE AS DIRECTED TWICE DAILY Althea Charon, Netta Neat, DO  Active   isosorbide mononitrate (IMDUR) 60 MG 24 hr tablet [id number]  Take 1 tablet by mouth once daily Karamalegos, Netta Neat, DO  Active   metFORMIN (GLUCOPHAGE) 1000 MG tablet [id number] Yes TAKE 1 TABLET BY MOUTH TWICE DAILY WITH A MEAL  Patient taking differently: Take 500 mg by mouth 2 (two) times daily with a meal.   Althea Charon, Netta Neat, DO Taking Active   metoCLOPramide (REGLAN) 10 MG tablet [id number]  TAKE 1 TABLET BY MOUTH EVERY 8 HOURS AS NEEDED FOR NAUSEA (HEART BURN) Althea Charon, Netta Neat, DO  Active   metoprolol succinate (TOPROL-XL) 25 MG 24 hr tablet [id number]  Take 1 tablet (25 mg total) by mouth daily.  Karamalegos, Netta Neat, DO  Active   nitroGLYCERIN (NITROSTAT) 0.4 MG SL tablet [id number]  Place 1 tablet (0.4 mg total) under the tongue every 5 (five) minutes as needed for chest pain. Antonieta Iba, MD  Active   pantoprazole (PROTONIX) 40 MG tablet [id number]  Take 1 tablet (40 mg total) by mouth daily before breakfast. Smitty Cords, DO  Active   sertraline (ZOLOFT) 50 MG tablet [id number]  Take 1 tablet (50 mg total) by mouth daily. Smitty Cords, DO  Active   [facility] injection [id number]  Inject 0.5 mL into muscle for shingles vaccine. Repeat dose in 2-6 months.  Patient not taking: Reported on 02/08/2023   Smitty Cords, DO  Active   Syringe, Disposable, 3 ML MISC [id number]  Use to inject humalog mix 75/25 17u BID Smitty Cords, DO  Active Pharmacy Records, Self  valsartan (DIOVAN) 320 MG tablet [id number]  Take 1 tablet (320 mg total) by mouth daily. Charlsie Quest, NP  Active                Assessment/Plan:   Encourage patient to follow up with pharmacy regarding receiving Tetanus vaccine  Diabetes: - Reviewed long term cardiovascular and renal outcomes of uncontrolled blood sugar - Reviewed goal A1c, goal fasting, and goal 2 hour post prandial glucose - Reviewed dietary modifications including having regular, well-balanced meals, while controlling carbohydrate portion sizes - Have counseled patient on s/s of low blood sugar and how to treat lows - Recommend patient to continue to monitor home blood sugar, keep log of the results and bring this record with him to medical appointments     Hypertension: - Encourage patient to use CPAP consistently - Reviewed long term cardiovascular and renal outcomes of uncontrolled blood pressure - Recommended to continue to check home blood pressure and heart rate, keep log of the results and bring this record with him to medical appointments       Follow Up Plan: Clinical Pharmacist will follow up with patient by telephone on 05/23/2023 at 2:30 PM   Estelle Grumbles, PharmD, Patsy Baltimore, CPP Clinical Pharmacist [facility] [phone]

## 2023-03-15 ENCOUNTER — Other Ambulatory Visit: Payer: Self-pay | Admitting: Cardiovascular Disease

## 2023-04-05 ENCOUNTER — Other Ambulatory Visit: Payer: Self-pay | Admitting: Family Medicine

## 2023-04-05 DIAGNOSIS — E1169 Type 2 diabetes mellitus with other specified complication: Secondary | ICD-10-CM

## 2023-04-05 DIAGNOSIS — I2510 Atherosclerotic heart disease of native coronary artery without angina pectoris: Secondary | ICD-10-CM

## 2023-04-05 DIAGNOSIS — I10 Essential (primary) hypertension: Secondary | ICD-10-CM

## 2023-04-10 ENCOUNTER — Emergency Department: Payer: Medicaid Other

## 2023-04-10 ENCOUNTER — Emergency Department
Admission: EM | Admit: 2023-04-10 | Discharge: 2023-04-10 | Disposition: A | Discharge status: Home / Self Care | Payer: Medicaid Other | Attending: Emergency Medicine | Admitting: Emergency Medicine

## 2023-04-10 DIAGNOSIS — Z7982 Long term (current) use of aspirin: Secondary | ICD-10-CM | POA: Diagnosis not present

## 2023-04-10 DIAGNOSIS — I10 Essential (primary) hypertension: Secondary | ICD-10-CM | POA: Insufficient documentation

## 2023-04-10 DIAGNOSIS — Z951 Presence of aortocoronary bypass graft: Secondary | ICD-10-CM | POA: Insufficient documentation

## 2023-04-10 DIAGNOSIS — E119 Type 2 diabetes mellitus without complications: Secondary | ICD-10-CM | POA: Insufficient documentation

## 2023-04-10 DIAGNOSIS — R079 Chest pain, unspecified: Secondary | ICD-10-CM | POA: Diagnosis not present

## 2023-04-10 DIAGNOSIS — I160 Hypertensive urgency: Secondary | ICD-10-CM | POA: Diagnosis not present

## 2023-04-10 DIAGNOSIS — R0789 Other chest pain: Secondary | ICD-10-CM | POA: Diagnosis not present

## 2023-04-10 DIAGNOSIS — I2510 Atherosclerotic heart disease of native coronary artery without angina pectoris: Secondary | ICD-10-CM | POA: Diagnosis not present

## 2023-04-10 LAB — CBC
HCT: 31 % — ABNORMAL LOW (ref 39.0–52.0)
Hemoglobin: 9.3 g/dL — ABNORMAL LOW (ref 13.0–17.0)
MCH: 20.7 pg — ABNORMAL LOW (ref 26.0–34.0)
MCHC: 30 g/dL (ref 30.0–36.0)
MCV: 68.9 fL — ABNORMAL LOW (ref 80.0–100.0)
Platelets: 194 10*3/uL (ref 150–400)
RBC: 4.5 MIL/uL (ref 4.22–5.81)
RDW: 20.4 % — ABNORMAL HIGH (ref 11.5–15.5)
WBC: 4 10*3/uL (ref 4.0–10.5)
nRBC: 0 % (ref 0.0–0.2)

## 2023-04-10 LAB — BASIC METABOLIC PANEL
Anion gap: 10 (ref 5–15)
BUN: 12 mg/dL (ref 8–23)
CO2: 23 mmol/L (ref 22–32)
Calcium: 9 mg/dL (ref 8.9–10.3)
Chloride: 97 mmol/L — ABNORMAL LOW (ref 98–111)
Creatinine, Ser: 0.97 mg/dL (ref 0.61–1.24)
GFR, Estimated: >60 mL/min (ref >60)
Glucose, Bld: 107 mg/dL — ABNORMAL HIGH (ref 70–99)
Potassium: 4 mmol/L (ref 3.5–5.1)
Sodium: 130 mmol/L — ABNORMAL LOW (ref 135–145)

## 2023-04-10 LAB — TROPONIN I (HIGH SENSITIVITY)
Troponin I (High Sensitivity): 38 ng/L — ABNORMAL HIGH (ref <18)
Troponin I (High Sensitivity): 38 ng/L — ABNORMAL HIGH (ref <18)

## 2023-04-10 NOTE — ED Triage Notes (Signed)
PER EMS: pt is from home with c/o chest tightness across his chest, onset tonight at 7pm associated with weakness. Denies shortness of breath. S/P CABG x 3.  He took 324 aspirin PTA as well as received 1 SL spray of nitroglycerin by EMS.   BP-146/88, HR-72, 97% RA, CBG-166 20g left wrist

## 2023-04-10 NOTE — Discharge Instructions (Signed)

## 2023-04-10 NOTE — ED Provider Notes (Signed)
[facility] Provider Note    Event Date/Time   First MD Initiated Contact with Patient 04/10/23 2105     (approximate)   History   Chest Pain   HPI  Craig Harvey is a 67 y.o. male  coronary artery disease status post CABG x 3 vessels (01/2015) with PCI/DES to proximal RCA (6/23), postoperative atrial fibrillation, hypertension, hyperlipidemia, obesity, OSA on CPAP, type 2 diabetes, presents today for follow-up of CAD and hypertension     Patient was at home watching basketball game.  He noticed a slight feeling of tingling or burning in his chest.  He checked his blood pressure and noticed it was slightly elevated then checked again his blood pressure kept going up each time he checked it.  During this period he started noticed a feeling of slight pressure in his chest.  Did not radiate no nausea or vomiting no shortness of breath.  He took 4 baby aspirin and a spray of nitroglycerin.  He reports his chest symptoms have improved.  He feels much better now.  This occurred at approximately 7 PM  Patient reports that this has happened to him before, often times his blood pressure will suddenly "shoot up"  And feels better now.  Symptoms improved  Physical Exam   Triage Vital Signs: ED Triage Vitals  Encounter Vitals Group     BP 04/10/23 2034 (!) 159/87     Systolic BP Percentile --      Diastolic BP Percentile --      Pulse Rate 04/10/23 2034 68     Resp 04/10/23 2034 18     Temp 04/10/23 2034 98.7 F (37.1 C)     Temp Source 04/10/23 2034 Oral     SpO2 04/10/23 2034 95 %     Weight 04/10/23 2035 289 lb (131.1 kg)     Height 04/10/23 2035 5\' 7"  (1.702 m)     Head Circumference --      Peak Flow --      Pain Score 04/10/23 2034 5     Pain Loc --      Pain Education --      Exclude from Growth Chart --     Most recent vital signs: Vitals:   04/10/23 2100 04/10/23 2130  BP: (!) 152/94 137/82  Pulse: 71 65  Resp: 13 18  Temp:    SpO2: 96%  100%     General: Awake, no distress.  CV:  Good peripheral perfusion.  Normal tones and rate Resp:  Normal effort.  Clear bilateral Abd:  No distention.  Soft nontender nondistended Other:  No lower extremity venous cords or congestion noted   ED Results / Procedures / Treatments   Labs (all labs ordered are listed, but only abnormal results are displayed) Labs Reviewed  BASIC METABOLIC PANEL - Abnormal; Notable for the following components:      Result Value   Sodium 130 (*)    Chloride 97 (*)    Glucose, Bld 107 (*)    All other components within normal limits  CBC - Abnormal; Notable for the following components:   Hemoglobin 9.3 (*)    HCT 31.0 (*)    MCV 68.9 (*)    MCH 20.7 (*)    RDW 20.4 (*)    All other components within normal limits  TROPONIN I (HIGH SENSITIVITY) - Abnormal; Notable for the following components:   Troponin I (High Sensitivity) 38 (*)    All  other components within normal limits     EKG  Interpreted by me at 2080 heart rate 70 QRS 90 QTc 400 Normal sinus rhythm no evidence of acute ischemia.  No ischemic abnormalities.  No evidence of STEMI.  First-degree AV block   RADIOLOGY  Chest x-ray interpreted by me as negative for acute      PROCEDURES:  Critical Care performed: No  Procedures   MEDICATIONS ORDERED IN ED: Medications - No data to display   IMPRESSION / MDM / ASSESSMENT AND PLAN / ED COURSE  I reviewed the triage vital signs and the nursing notes.                              Differential diagnosis includes, but is not limited to, ACS, aortic dissection, pulmonary embolism, cardiac tamponade, pneumothorax, pneumonia, pericarditis, myocarditis, GI-related causes including esophagitis/gastritis, and musculoskeletal chest wall pain.    The patient presents with a history of subtle chest pressure that is now alleviated after aspirin and nitroglycerin.  He has notable coronary risk factors and known coronary disease.  He  is currently resting comfortably on self-medicated with aspirin and nitroglycerin.  Reassuring examination at this time with symptoms that have abated.  However he does have a history of cardiac disease, and his first troponin is 20, but on review of historical evaluations his troponin is consistently been in the 50-30 range  Suspect he may have had an episode of angina, possibly induced by hypertensive urgency that is now improved.  His blood pressure is now 138 and he is asymptomatic.  He does report a history of fluctuation in blood pressures.  He has no signs or symptoms that would be suggestive of pneumothorax pneumonia pericarditis myocarditis PE STEMI or NSTEMI etc. at this time.  Suspect likely anginal type pain, or hypertensive urgency  Given the patient's significant cardiac history I discussed this case with her cardiologist Dr. Duke Salvia we discussed his initial ECG initial troponin.  They recommend that if repeat troponin is not elevating he may be discharged with close outpatient cardiology follow-up with cardiology will follow-up on.  Otherwise, if troponin elevating, he will require admission to the hospital for observation given his multiple coronary risk factors and presentation today.    Patient's presentation is most consistent with acute complicated illness / injury requiring diagnostic workup.   The patient is on the cardiac monitor to evaluate for evidence of arrhythmia and/or significant heart rate changes.  Patient pain and symptom free at approximately 11:15 PM.  Patient resting comfortably agreeable with plan for discharge with careful return precautions and he will also be calling cardiology clinic tomorrow  Return precautions and treatment recommendations and follow-up discussed with the patient who is agreeable with the plan.      FINAL CLINICAL IMPRESSION(S) / ED DIAGNOSES   Final diagnoses:  Chest pain, moderate coronary artery risk     Rx / DC Orders   ED  Discharge Orders          Ordered    Ambulatory referral to Cardiology       Comments: Chest pain   04/10/23 2152             Note:  This document was prepared using Dragon voice recognition software and may include unintentional dictation errors.   Sharyn Creamer, MD 04/10/23 [phone]

## 2023-04-12 NOTE — Progress Notes (Deleted)
Cardiology Office Note  Date:  04/12/2023   ID:  Craig Harvey, DOB 01/21/1956, MRN [id number]  PCP:  Smitty Cords, DO   No chief complaint on file.   HPI:  67 year old male with known history of  HTN,  Morbid obesity OSA not on CPAP,  CAD s/p 3 vessel CABG on 01/14/2015,  DM,  HLD  who presents for routine follow-up of his coronary artery disease, diabetes, hyperlipidemia  Last seen in clinic by myself  7/23 Seen by one of our providers August 2024   In the ER 01/06/22 with chest pain Reports that he had pain in his left pectoral area that hurt more when he moves his arms above his head, felt like a cramp Cardiac work-up negative It was recommended he follow-up in the office today  Cardiac cath 12/10/21 reviewed with him in detail ,  detailed below   -----------------LEFT CORONARY---------------------------   Mid LM to Dist LM lesion is 15% stenosed with 25% stenosed side branch in Ramus.   Ost LAD to Prox LAD lesion is 70% stenosed.  Prox LAD to Mid LAD lesion is 100% stenosed.   Ost Cx to Prox Cx lesion is 100% stenosed.   Prox Cx to Mid Cx lesion is 80% stenosed.   -----------------GRAFTS---------------------------   LIMA graft was visualized by angiography and is large.  The graft exhibits no disease.  There is no competitive flow   SVG-OM graft was visualized by angiography. The graft exhibits no disease.   SVG-RI gaft was visulalized by angiograph => Origin to Prox Graft lesion is 100% stenosed.   ------------------------------------------------------   -----------------RIGHT CORONARY---------------------------   CULPRIT LESION: Prox RCA lesion is 95% stenosed.   Balloon angioplasty was performed using sequential BALLN SCOREFLEX Balloons 3.0 x 10 & 3.50X10.   A drug-eluting stent was successfully placed using a STENT ONYX FRONTIER 3.5X15 -> postdilated to 4.1 mm   Post intervention, there is a 0% residual stenosis.    ------------------------------------------------------   The left ventricular systolic function is normal.   LV end diastolic pressure is normal.   SUMMARY Severe Native CAD:  100% CTO of proximal LCx after OM1/RI, 90% followed 100% CTO of LAD after SP1;  Widely patent ostial RI CULPRIT LESION 95% focal (napkin ring) proximal RCA Successful scoring balloon (sequential 3.0 mm & 3.5 mm ScoreFlex) angioplasty followed by => DES PCI of proximal RCA Onyx Frontier DES 3.5 mm x 15 mm postdilated to 4.1 mm. 95% reduced to 0% stenosis; TIMI-3 flow pre and post.   Distal LAD has tandem 50% lesions, distal LCx with retrograde flow reveals 80% stenosis prior to anastomosis. 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  EKG was unremarkable in ED and Cardiac enzymes were negative.   Echo on 7/4 showed EF 60-65%, no regional wall motion abnormalities, no valvular abnormalities, PASP normal, normal study.  He underwent nuclear stress test that showed large defect of moderate  severity in the mid anterior, mid anteroseptal, mid anterolateral, apical anterior, apical lateral and apex locations. F   Catheterization prior to CABG  underwent cardiac catheterization which showed severe 2 vessel CAD (ost LAD 70%, mid LAD 99%, ost LCx to prox LCx 95% after the take off of OM1, mid LCx 80% prior to takeoff to OM2, normal LV function).    He underwent successful 3  vessel CABG (LIMA-LAD, SVG-OM, SVG-Ramus) on 01/14/2015. Post-op he developed Afib and converted on IV amiodarone. His A1C was poorly controlled coming in at 10.0%.     PMH:   has a past medical history of CAD (coronary artery disease), History of echocardiogram, Hyperlipidemia LDL goal <70, Hypertension, Morbid obesity (HCC), OSA (obstructive sleep apnea), and Type II diabetes mellitus (HCC).  PSH:    Past Surgical History:  Procedure Laterality Date   CARDIAC CATHETERIZATION N/A 01/09/2015   Procedure: Left Heart Cath and Coronary Angiography;  Surgeon: Antonieta Iba, MD;  Location: [facility] INVASIVE CV LAB;  Service: Cardiovascular;  Laterality: N/A;   CORONARY ARTERY BYPASS GRAFT N/A 01/14/2015   Procedure: CORONARY ARTERY BYPASS GRAFTING (CABG), ON PUMP, TIMES THREE, USING LEFT INTERNAL MAMMARY ARTERY, RIGHT GREATER SAPHENOUS VEIN HARVESTED ENDOSCOPICALLY;  Surgeon: Kerin Perna, MD;  Location: [facility] OR;  Service: Open Heart Surgery;  Laterality: N/A;   CORONARY STENT INTERVENTION N/A 12/10/2021   Procedure: CORONARY STENT INTERVENTION;  Surgeon: Marykay Lex, MD;  Location: [facility] INVASIVE CV LAB;  Service: Cardiovascular;  Laterality: N/A;   HERNIA REPAIR     LEFT HEART CATH AND CORONARY ANGIOGRAPHY N/A 12/10/2021   Procedure: LEFT HEART CATH AND CORONARY ANGIOGRAPHY;  Surgeon: Marykay Lex, MD;  Location: [facility] INVASIVE CV LAB;  Service: Cardiovascular;  Laterality: N/A;   TEE WITHOUT CARDIOVERSION N/A 01/14/2015   Procedure: TRANSESOPHAGEAL ECHOCARDIOGRAM (TEE);  Surgeon: Kerin Perna, MD;  Location: [facility] OR;  Service: Open Heart Surgery;  Laterality: N/A;    Current Outpatient Medications  Medication Sig Dispense Refill   Accu-Chek Softclix Lancets lancets Use to check blood sugar up to 3 x daily 100 each 12   amLODipine (NORVASC) 5 MG tablet TAKE 1 TABLET BY MOUTH ONCE DAILY AT 5 PM 90 tablet 0   aspirin EC 81 MG tablet Take 1 tablet (81 mg total) by mouth daily. 90 tablet 3    atorvastatin (LIPITOR) 80 MG tablet Take 1 tablet (80 mg total) by mouth daily. 90 tablet 3   baclofen (LIORESAL) 10 MG tablet TAKE ONE-HALF TO ONE TABLET BY MOUTH TWICE DAILY AS NEEDED FOR MUSCLE SPASM (LEG PAIN) 180 tablet 1   Blood Glucose Monitoring Suppl (ACCU-CHEK GUIDE ME) w/Device KIT Use to check blood sugar up to 3 x daily 1 kit 0   clopidogrel (PLAVIX) 75 MG tablet Take 1 tablet (75 mg total) by mouth daily. 90 tablet 3   docusate sodium (COLACE) 100 MG capsule Take 100 mg by mouth 2 (two) times daily.     ezetimibe (ZETIA) 10 MG tablet Take 1 tablet by mouth once daily 90 tablet 0   glucose blood (ACCU-CHEK GUIDE) test strip Use to check blood sugar up to 3 x daily 100 each 12   insulin lispro protamine-lispro (HUMALOG MIX 75/25) (75-25) 100 UNIT/ML SUSP injection INJECT 17 UNITS UNDER THE SKIN TWICE DAILY WITH A MEAL. MAX OF 50 UNITS DAILY. 30 mL 3   Insulin Syringe-Needle U-100 (BD VEO INSULIN SYRINGE U/F) 31G X 15/64" 0.3 ML MISC USE AS DIRECTED TWICE DAILY  200 each 3   isosorbide mononitrate (IMDUR) 60 MG 24 hr tablet Take 1 tablet by mouth once daily 90 tablet 0   metFORMIN (GLUCOPHAGE) 1000 MG tablet TAKE 1 TABLET BY MOUTH TWICE DAILY WITH A MEAL (Patient taking differently: Take 500 mg by mouth 2 (two) times daily with a meal.) 180 tablet 0   metoCLOPramide (REGLAN) 10 MG tablet TAKE 1 TABLET BY MOUTH EVERY 8 HOURS AS NEEDED FOR NAUSEA (HEART BURN) 30 tablet 0   metoprolol succinate (TOPROL-XL) 25 MG 24 hr tablet Take 1 tablet (25 mg total) by mouth daily. 90 tablet 1   nitroGLYCERIN (NITROSTAT) 0.4 MG SL tablet Place 1 tablet (0.4 mg total) under the tongue every 5 (five) minutes as needed for chest pain. 25 tablet 3   pantoprazole (PROTONIX) 40 MG tablet Take 1 tablet (40 mg total) by mouth daily before breakfast. 90 tablet 3   sertraline (ZOLOFT) 50 MG tablet Take 1 tablet (50 mg total) by mouth daily. 90 tablet 3   SHINGRIX injection Inject 0.5 mL into muscle for shingles  vaccine. Repeat dose in 2-6 months. (Patient not taking: Reported on 02/08/2023) 0.5 mL 1   Syringe, Disposable, 3 ML MISC Use to inject humalog mix 75/25 17u BID 200 each 5   valsartan (DIOVAN) 320 MG tablet Take 1 tablet (320 mg total) by mouth daily. 90 tablet 3   No current facility-administered medications for this visit.   Allergies:   Trulicity [dulaglutide]   Social History:  The patient  reports that he quit smoking about 47 years ago. His smoking use included cigarettes. He has never used smokeless tobacco. He reports that he does not drink alcohol and does not use drugs.   Family History:   family history includes CAD in his brother and mother; Throat cancer in his father.    Review of Systems: Review of Systems  Constitutional: Negative.   Respiratory: Negative.    Cardiovascular: Negative.   Gastrointestinal: Negative.   Musculoskeletal:  Positive for back pain.  Neurological: Negative.   Psychiatric/Behavioral: Negative.    All other systems reviewed and are negative.   PHYSICAL EXAM: VS:  There were no vitals taken for this visit. , BMI There is no height or weight on file to calculate BMI. Constitutional:  oriented to person, place, and time. No distress.  HENT:  Head: Grossly normal Eyes:  no discharge. No scleral icterus.  Neck: No JVD, no carotid bruits  Cardiovascular: Regular rate and rhythm, no murmurs appreciated Pulmonary/Chest: Clear to auscultation bilaterally, no wheezes or rails Abdominal: Soft.  no distension.  no tenderness.  Musculoskeletal: Normal range of motion Neurological:  normal muscle tone. Coordination normal. No atrophy Skin: Skin warm and dry Psychiatric: normal affect, pleasant   Recent Labs: 05/11/2022: Magnesium 1.8 07/19/2022: ALT 19; TSH 1.07 04/10/2023: BUN 12; Creatinine, Ser 0.97; Hemoglobin 9.3; Platelets 194; Potassium 4.0; Sodium 130    Lipid Panel Lab Results  Component Value Date   CHOL 92 07/19/2022   HDL 37 (L)  07/19/2022   LDLCALC 41 07/19/2022   TRIG 55 07/19/2022    Wt Readings from Last 3 Encounters:  04/10/23 289 lb (131.1 kg)  02/08/23 289 lb 12.8 oz (131.5 kg)  02/01/23 290 lb (131.5 kg)     ASSESSMENT AND PLAN:  Coronary artery disease of bypass graft of native heart with stable angina pectoris (HCC) -  Recent atypical chest pain consistent with musculoskeletal etiology, seen in the ER work-up negative work-up reviewed Currently  with no symptoms of angina. No further workup at this time. Continue current medication regimen.  Mixed hyperlipidemia - Plan: EKG 12-Lead Cholesterol is at goal on the current lipid regimen. No changes to the medications were made.  Essential hypertension - Plan: EKG 12-Lead Blood pressure is well controlled on today's visit. No changes made to the medications.  Type 2 diabetes mellitus without complication, with long-term current use of insulin (HCC) - Plan: EKG 12-Lead  hemoglobin A 1C well controlled  Morbid obesity (HCC) - Plan: EKG 12-Lead  We have encouraged continued exercise, careful diet management in an effort to lose weight.  Erectile dysfunction Managed by PMD    Total encounter time more than 30 minutes  Greater than 50% was spent in counseling and coordination of care with the patient   No orders of the defined types were placed in this encounter.    Signed, Dossie Arbour, M.D., Ph.D. 04/12/2023  [facility] [place], [place] [phone]

## 2023-04-13 ENCOUNTER — Ambulatory Visit: Payer: Medicaid Other | Admitting: Cardiovascular Disease

## 2023-04-30 ENCOUNTER — Other Ambulatory Visit: Payer: Self-pay | Admitting: Family Medicine

## 2023-04-30 DIAGNOSIS — E1151 Type 2 diabetes mellitus with diabetic peripheral angiopathy without gangrene: Secondary | ICD-10-CM

## 2023-05-02 NOTE — Telephone Encounter (Signed)
Requested Prescriptions  Pending Prescriptions Disp Refills   metFORMIN (GLUCOPHAGE) 1000 MG tablet [Pharmacy Med Name: metFORMIN HCl 1000 MG Oral Tablet] 180 tablet 0    Sig: TAKE 1 TABLET BY MOUTH TWICE DAILY WITH A MEAL     Endocrinology:  Diabetes - Biguanides Failed - 04/30/2023 10:22 AM      Failed - B12 Level in normal range and within 720 days    No results found for: "VITAMINB12"       Passed - Cr in normal range and within 360 days    Creat  Date Value Ref Range Status  07/19/2022 0.76 0.70 - 1.35 mg/dL Final   Creatinine, Ser  Date Value Ref Range Status  04/10/2023 0.97 0.61 - 1.24 mg/dL Final   Creatinine, Urine  Date Value Ref Range Status  07/26/2022 92 20 - 320 mg/dL Final         Passed - HBA1C is between 0 and 7.9 and within 180 days    Hemoglobin A1C  Date Value Ref Range Status  02/01/2023 6.0 (A) 4.0 - 5.6 % Final  04/18/2017 6.4  Final   Hgb A1c MFr Bld  Date Value Ref Range Status  07/19/2022 5.7 (H) <5.7 % of total Hgb Final    Comment:    For someone without known diabetes, a hemoglobin  A1c value between 5.7% and 6.4% is consistent with prediabetes and should be confirmed with a  follow-up test. . For someone with known diabetes, a value <7% indicates that their diabetes is well controlled. A1c targets should be individualized based on duration of diabetes, age, comorbid conditions, and other considerations. . This assay result is consistent with an increased risk of diabetes. . Currently, no consensus exists regarding use of hemoglobin A1c for diagnosis of diabetes for children. .          Passed - eGFR in normal range and within 360 days    GFR, Est African American  Date Value Ref Range Status  07/15/2020 108 > OR = 60 mL/min/1.69m2 Final   GFR, Est Non African American  Date Value Ref Range Status  07/15/2020 93 > OR = 60 mL/min/1.22m2 Final   GFR, Estimated  Date Value Ref Range Status  04/10/2023 >60 >60 mL/min Final     Comment:    (NOTE) Calculated using the CKD-EPI Creatinine Equation (2021)    eGFR  Date Value Ref Range Status  07/19/2022 100 > OR = 60 mL/min/1.68m2 Final         Passed - Valid encounter within last 6 months    Recent Outpatient Visits           1 month ago DM (diabetes mellitus), type 2 with peripheral vascular complications [facility])   [place] [facility] Delles, Gentry Fitz A, RPH-CPP   3 months ago DM (diabetes mellitus), type 2 with peripheral vascular complications [facility])   [place] [facility] [place], Netta Neat, DO   5 months ago Essential hypertension   [place] [facility] Delles, Gentry Fitz A, RPH-CPP   6 months ago Gastroesophageal reflux disease without esophagitis   [place] [facility] [place], Netta Neat, DO   8 months ago Essential hypertension   [place] [facility] Delles, Jackelyn Poling, RPH-CPP       Future Appointments             In 1 week Brion Aliment, Dois [place], NP [place] [facility]  In 3 months Karamalegos, Netta Neat, DO [place] [facility]            Passed - CBC within normal limits and completed in the last 12 months    WBC  Date Value Ref Range Status  04/10/2023 4.0 4.0 - 10.5 K/uL Final   RBC  Date Value Ref Range Status  04/10/2023 4.50 4.22 - 5.81 MIL/uL Final   Hemoglobin  Date Value Ref Range Status  04/10/2023 9.3 (L) 13.0 - 17.0 g/dL Final  30/86/5784 69.6 13.0 - 17.7 g/dL Final    Comment:                  **Please note reference interval change**   HCT  Date Value Ref Range Status  04/10/2023 31.0 (L) 39.0 - 52.0 % Final   Hematocrit  Date Value Ref Range Status  06/09/2016 41.5 37.5 - 51.0 % Final   MCHC  Date Value Ref Range Status  04/10/2023 30.0 30.0 - 36.0 g/dL Final   [facility]  Date Value Ref Range Status  04/10/2023 20.7 (L) 26.0 - 34.0 pg Final   MCV  Date  Value Ref Range Status  04/10/2023 68.9 (L) 80.0 - 100.0 fL Final  06/09/2016 83 79 - 97 fL Final   No results found for: "PLTCOUNTKUC", "LABPLAT", "POCPLA" RDW  Date Value Ref Range Status  04/10/2023 20.4 (H) 11.5 - 15.5 % Final  06/09/2016 16.6 (H) 12.3 - 15.4 % Final

## 2023-05-13 ENCOUNTER — Encounter: Payer: Self-pay | Admitting: Nurse Practitioner

## 2023-05-13 ENCOUNTER — Ambulatory Visit: Payer: Medicaid Other | Attending: Cardiovascular Disease | Admitting: Emergency Medicine

## 2023-05-13 VITALS — BP 115/68 | HR 67 | Ht 68.0 in | Wt 305.4 lb

## 2023-05-13 DIAGNOSIS — I2510 Atherosclerotic heart disease of native coronary artery without angina pectoris: Secondary | ICD-10-CM

## 2023-05-13 DIAGNOSIS — I10 Essential (primary) hypertension: Secondary | ICD-10-CM

## 2023-05-13 DIAGNOSIS — E1169 Type 2 diabetes mellitus with other specified complication: Secondary | ICD-10-CM

## 2023-05-13 DIAGNOSIS — E785 Hyperlipidemia, unspecified: Secondary | ICD-10-CM

## 2023-05-13 DIAGNOSIS — Z951 Presence of aortocoronary bypass graft: Secondary | ICD-10-CM | POA: Diagnosis not present

## 2023-05-13 DIAGNOSIS — G4733 Obstructive sleep apnea (adult) (pediatric): Secondary | ICD-10-CM

## 2023-05-13 NOTE — Patient Instructions (Signed)
Medication Instructions:  No changes *If you need a refill on your cardiac medications before your next appointment, please call your pharmacy*   Lab Work: None ordered If you have labs (blood work) drawn today and your tests are completely normal, you will receive your results only by: MyChart Message (if you have MyChart) OR A paper copy in the mail If you have any lab test that is abnormal or we need to change your treatment, we will call you to review the results.   Testing/Procedures: None ordered   Follow-Up: At [facility], you and your health needs are our priority.  As part of our continuing mission to provide you with exceptional heart care, we have created designated Provider Care Teams.  These Care Teams include your primary Cardiologist (physician) and Advanced Practice Providers (APPs -  Physician Assistants and Nurse Practitioners) who all work together to provide you with the care you need, when you need it.  We recommend signing up for the patient portal called "MyChart".  Sign up information is provided on this After Visit Summary.  MyChart is used to connect with patients for Virtual Visits (Telemedicine).  Patients are able to view lab/test results, encounter notes, upcoming appointments, etc.  Non-urgent messages can be sent to your provider as well.   To learn more about what you can do with MyChart, go to [email or web address].    Your next appointment:   3 month(s)  Provider:   You may see Julien Nordmann, MD or one of the following Advanced Practice Providers on your designated Care Team:   Nicolasa Ducking, NP

## 2023-05-13 NOTE — Progress Notes (Addendum)
Office Visit    Patient Name: Craig Harvey Date of Encounter: 05/13/2023  Primary Care Provider:  Smitty Cords, DO Primary Cardiologist:  Julien Nordmann, MD  Chief Complaint    67 y.o. male with a history of coronary artery disease s/p CABG x 3 (01/2015) with PCI/DES to proximal RCA (12/2021), postoperative atrial fibrillation, hypertension, hyperlipidemia, obesity, OSA on CPAP, type 2 diabetes who presents today for follow-up after ED visit for chest pain.   Past Medical History  Subjective   Past Medical History:  Diagnosis Date   CAD (coronary artery disease)    a. 01/2015 Ex MV: high risk; b. 01/2015 Cath: ostLAD 70, mLAD 99%, ost to pLCx 95%, mLCx 80%, LVEF nl; c. 01/2015 s/p 3v CABG (LIMA-LAD, SVG-OM, SVG-Ramus); d. 12/2021 PCI: LM 15/25, LAD 70ost, 100p/m, RI small, LCX 100ost, 80p/m, RCA 95p (3.5x15 On[employer] Frontier DES), VG->OM2 nl, VG->RI 100p, LIMA-LAD nl   Diastolic dysfunction    a. 01/2015 Echo: EF 60-65%; b. 09/2022 Echo: EF 60-65%, no rwma, GrI DD, nl RV size/fxn, RVSP 26.71mmHg, mildly to mod dil LA. Ao root 40mm, Asc Ao 36mm.   Hyperlipidemia LDL goal <70    Hypertension    Morbid obesity (HCC)    OSA (obstructive sleep apnea)    Type II diabetes mellitus (HCC)    Past Surgical History:  Procedure Laterality Date   CARDIAC CATHETERIZATION N/A 01/09/2015   Procedure: Left Heart Cath and Coronary Angiography;  Surgeon: Antonieta Iba, MD;  Location: [facility] INVASIVE CV LAB;  Service: Cardiovascular;  Laterality: N/A;   CORONARY ARTERY BYPASS GRAFT N/A 01/14/2015   Procedure: CORONARY ARTERY BYPASS GRAFTING (CABG), ON PUMP, TIMES THREE, USING LEFT INTERNAL MAMMARY ARTERY, RIGHT GREATER SAPHENOUS VEIN HARVESTED ENDOSCOPICALLY;  Surgeon: Kerin Perna, MD;  Location: [facility] OR;  Service: Open Heart Surgery;  Laterality: N/A;   CORONARY STENT INTERVENTION N/A 12/10/2021   Procedure: CORONARY STENT INTERVENTION;  Surgeon: Marykay Lex, MD;  Location: [facility] INVASIVE CV  LAB;  Service: Cardiovascular;  Laterality: N/A;   HERNIA REPAIR     LEFT HEART CATH AND CORONARY ANGIOGRAPHY N/A 12/10/2021   Procedure: LEFT HEART CATH AND CORONARY ANGIOGRAPHY;  Surgeon: Marykay Lex, MD;  Location: [facility] INVASIVE CV LAB;  Service: Cardiovascular;  Laterality: N/A;   TEE WITHOUT CARDIOVERSION N/A 01/14/2015   Procedure: TRANSESOPHAGEAL ECHOCARDIOGRAM (TEE);  Surgeon: Kerin Perna, MD;  Location: [facility] OR;  Service: Open Heart Surgery;  Laterality: N/A;    Allergies  Allergies  Allergen Reactions   Trulicity [Dulaglutide] Other (See Comments)    Headaches       History of Present Illness      67 y.o. y/o male with a history of coronary artery disease s/p CABG x 3 (01/2015) with PCI/DES to proximal RCA (12/2021), postoperative atrial fibrillation, hypertension, hyperlipidemia, obesity, OSA on CPAP, type 2 diabetes.  He was admitted on 01/2015 for angina. Echo performed showing EF 60 to 65%, no RWMA no significant valve abnormalities.  Lexiscan MPI large defect with moderate in severity.  Diagnostic LHC was performed showing severe two-vessel CAD and he was transferred to [facility] where he underwent three-vessel CABG (LIMA-LAD, SVG-OM and SVG-ramus).  His postop course was complicated by A-fib requiring IV amiodarone.  In 12/2021 he was admitted for unstable angina, echo showed EF 60 to 65%, no RWMA, grade 1 diastolic function, no valvular disease.  He underwent LHC which demonstrated severe native vessel disease with 3 patent grafts, occlusion noted  SVG-ramus, culprit lesion was 95% focal PAD RCA stenosis, successful PCI/DES.  On 04/12/22 he presented again with chest pain and labile blood pressures, Lexiscan at that time revealed no evidence of ischemia considered low risk exam. Seen in clinic on 02/08/2023 where he stated that he has been doing well from a cardiac perspective.  He noted that he is trying to be more compliant with his CPAP.  No medication changes were made.       On 04/10/2023 he was seen at [facility] ED with complaints of chest pain.  He was at rest when he noticed a slight feeling of tingling and burning in his chest, he checked his blood pressure and noticed it was slightly elevated.  He took 4 baby aspirin's.  Nitroglycerin which relieved his chest pains.  EKG normal sinus rhythm, first-degree AV block, with no evidence of acute ischemia.  His troponins were 38 and again 38, however his troponins have consistently been in the 30-50 range.  He was discharged home chest pain-free.  Today he is doing well from a cardiac perspective.  He notes the day of his  ED visit for chest pain he had a stressful event occur with his neighbor just prior to CP onset that caused his blood pressure to become elevated to 203/100. He notes being "worked up" which he believes caused this episode. He noted that the nitroglycerin x1 immediately relieved his pain.  He states that over the past 30 days since his ED visit he has not had any chest pain or associated shortness of breath.  He denies exertional angina, palpitations, syncope. He notes mild on-going tenderness on palpation to his sternal scar that has been present since his bypass surgery.  He reports that his blood pressure has been well under control over the past month.  Objective  Home Medications    Current Outpatient Medications  Medication Sig Dispense Refill   Accu-Chek Softclix Lancets lancets Use to check blood sugar up to 3 x daily 100 each 12   amLODipine (NORVASC) 5 MG tablet TAKE 1 TABLET BY MOUTH ONCE DAILY AT 5 PM 90 tablet 0   aspirin EC 81 MG tablet Take 1 tablet (81 mg total) by mouth daily. 90 tablet 3   atorvastatin (LIPITOR) 80 MG tablet Take 1 tablet (80 mg total) by mouth daily. 90 tablet 3   baclofen (LIORESAL) 10 MG tablet TAKE ONE-HALF TO ONE TABLET BY MOUTH TWICE DAILY AS NEEDED FOR MUSCLE SPASM (LEG PAIN) 180 tablet 1   Blood Glucose Monitoring Suppl (ACCU-CHEK GUIDE ME) w/Device KIT Use to check  blood sugar up to 3 x daily 1 kit 0   clopidogrel (PLAVIX) 75 MG tablet Take 1 tablet (75 mg total) by mouth daily. 90 tablet 3   docusate sodium (COLACE) 100 MG capsule Take 100 mg by mouth 2 (two) times daily.     ezetimibe (ZETIA) 10 MG tablet Take 1 tablet by mouth once daily 90 tablet 0   glucose blood (ACCU-CHEK GUIDE) test strip Use to check blood sugar up to 3 x daily 100 each 12   insulin lispro protamine-lispro (HUMALOG MIX 75/25) (75-25) 100 UNIT/ML SUSP injection INJECT 17 UNITS UNDER THE SKIN TWICE DAILY WITH A MEAL. MAX OF 50 UNITS DAILY. 30 mL 3   Insulin Syringe-Needle U-100 (BD VEO INSULIN SYRINGE U/F) 31G X 15/64" 0.3 ML MISC USE AS DIRECTED TWICE DAILY 200 each 3   isosorbide mononitrate (IMDUR) 60 MG 24 hr tablet Take 1 tablet by  mouth once daily 90 tablet 0   metFORMIN (GLUCOPHAGE) 1000 MG tablet TAKE 1 TABLET BY MOUTH TWICE DAILY WITH A MEAL 180 tablet 0   metoCLOPramide (REGLAN) 10 MG tablet TAKE 1 TABLET BY MOUTH EVERY 8 HOURS AS NEEDED FOR NAUSEA (HEART BURN) 30 tablet 0   metoprolol succinate (TOPROL-XL) 25 MG 24 hr tablet Take 1 tablet (25 mg total) by mouth daily. 90 tablet 1   nitroGLYCERIN (NITROSTAT) 0.4 MG SL tablet Place 1 tablet (0.4 mg total) under the tongue every 5 (five) minutes as needed for chest pain. 25 tablet 3   pantoprazole (PROTONIX) 40 MG tablet Take 1 tablet (40 mg total) by mouth daily before breakfast. 90 tablet 3   sertraline (ZOLOFT) 50 MG tablet Take 1 tablet (50 mg total) by mouth daily. 90 tablet 3   SHINGRIX injection Inject 0.5 mL into muscle for shingles vaccine. Repeat dose in 2-6 months. 0.5 mL 1   Syringe, Disposable, 3 ML MISC Use to inject humalog mix 75/25 17u BID 200 each 5   valsartan (DIOVAN) 320 MG tablet Take 1 tablet (320 mg total) by mouth daily. 90 tablet 3   No current facility-administered medications for this visit.     Physical Exam    VS:  BP 115/68 (BP Location: Left Arm, Patient Position: Sitting, Cuff Size: Large)    Pulse 67   Ht 5\' 8"  (1.727 m)   Wt (!) 305 lb 6.4 oz (138.5 kg)   SpO2 95%   BMI 46.44 kg/m  , BMI Body mass index is 46.44 kg/m.       GEN: Well nourished, well developed, in no acute distress. HEENT: normal. Neck: Supple, no JVD, carotid bruits, or masses. Cardiac: RRR, no murmurs, rubs, or gallops. No clubbing, cyanosis, edema.  Radials 2+/PT 2+ and equal bilaterally.  Respiratory:  Respirations regular and unlabored, clear to auscultation bilaterally. GI: Soft, nontender, nondistended, BS + x 4. MS: no deformity or atrophy. Skin: warm and dry, no rash. Neuro:  Strength and sensation are intact. Psych: Normal affect.  Accessory Clinical Findings    ECG personally reviewed by me today - EKG Interpretation Date/Time:  Friday May 13 2023 15:10:26 EST Ventricular Rate:  67 PR Interval:  196 QRS Duration:  84 QT Interval:  368 QTC Calculation: 388 R Axis:   9  Text Interpretation: Normal sinus rhythm Normal ECG Confirmed by Rise Paganini [phone]) on 05/13/2023 3:41:10 PM  - no acute changes.  Lab Results  Component Value Date   WBC 4.0 04/10/2023   HGB 9.3 (L) 04/10/2023   HCT 31.0 (L) 04/10/2023   MCV 68.9 (L) 04/10/2023   PLT 194 04/10/2023   Lab Results  Component Value Date   CREATININE 0.97 04/10/2023   BUN 12 04/10/2023   NA 130 (L) 04/10/2023   K 4.0 04/10/2023   CL 97 (L) 04/10/2023   CO2 23 04/10/2023   Lab Results  Component Value Date   ALT 19 07/19/2022   AST 19 07/19/2022   ALKPHOS 52 05/16/2022   BILITOT 0.5 07/19/2022   Lab Results  Component Value Date   CHOL 92 07/19/2022   HDL 37 (L) 07/19/2022   LDLCALC 41 07/19/2022   TRIG 55 07/19/2022   CHOLHDL 2.5 07/19/2022    Lab Results  Component Value Date   HGBA1C 6.0 (A) 02/01/2023   Lab Results  Component Value Date   TSH 1.07 07/19/2022       Assessment & Plan  1.  Coronary artery disease without angina -S/p CABG x 3 (LIMA-LAD, SVG-OM and SVG-ramus) in 2013 -S/p  PCI/DES proximal RCA on 12/2021 -Last ECHO 09/10/22 LVEF 60-65%, NO RWMA, grade 1DD, RV SF normal -ED visit 04/10/23 for chest pain, started at rest, troponins stable (38-->38), EKG without acute ischemia. Hx of chronically elevated troponins -No exertional angina noted since ED visit, likely caused by stressful event with neighbor and HTN urgency  -Ischemic evaluation was offered. Lexiscan MPI and stress PET was discussed with pt who has agreed to defer at this time given no concerning ischemic symptoms in >30 days. He will call office if symptoms arise.  -Continue aspirin 81mg , Plavix 75mg , nitroglycerin 0.4 mL, isosorbide 60mg   2.  Hypertension -Well-controlled at 115/68 -He reports controlled since ED visit -Continue logging blood pressure at home -Continue amlodipine 5mg , valsartan 320mg , metoprolol succinate 25mg    3.  Hyperlipidemia -LDL 41 on 07/19/2022 -Well below goal of 70 -Continue atorvastatin 80 mg, ezetimibe 10 mg -Encouraged heart healthy diet  4.  Type 2 diabetes -A1c 6 on 02/01/2023 -Continue to follow with PCP -Encouraged weight loss, daily physical exercise  5.  Obstructive sleep apnea -Intermittent usage of CPAP -Noted risk to patient regarding uncontrolled OSA on CVD  Disposition: Follow-up in 3 months   Denyce Robert, NP 05/13/2023, 3:41 PM

## 2023-05-23 ENCOUNTER — Ambulatory Visit: Payer: Medicaid Other | Admitting: Pharmacist

## 2023-05-23 DIAGNOSIS — I10 Essential (primary) hypertension: Secondary | ICD-10-CM

## 2023-05-23 DIAGNOSIS — I2510 Atherosclerotic heart disease of native coronary artery without angina pectoris: Secondary | ICD-10-CM

## 2023-05-23 DIAGNOSIS — E1151 Type 2 diabetes mellitus with diabetic peripheral angiopathy without gangrene: Secondary | ICD-10-CM

## 2023-05-23 NOTE — Patient Instructions (Signed)
Goals Addressed             This Visit's Progress    Pharmacy Goals       Our goal A1c is less than 7%. This corresponds with fasting sugars less than 130 and 2 hour after meal sugars less than 180. Please keep a log of your results when checking your blood sugar   Our goal bad cholesterol, or LDL, is less than 70 . This is why it is important to continue taking your atorvastatin and ezetimibe.  Check your blood pressure daily, and any time you have concerning symptoms like headache, chest pain, dizziness, shortness of breath, or vision changes.   Our goal is less than 130/80.  To appropriately check your blood pressure, make sure you do the following:  1) Avoid caffeine, exercise, or tobacco products for 30 minutes before checking. Empty your bladder. 2) Sit with your back supported in a flat-backed chair. Rest your arm on something flat (arm of the chair, table, etc). 3) Sit still with your feet flat on the floor, resting, for at least 5 minutes.  4) Check your blood pressure. Take 1-2 readings.  5) Write down these readings and bring with you to any provider appointments.  Bring your home blood pressure machine with you to a provider's office for accuracy comparison at least once a year.   Make sure you take your blood pressure medications before you come to any office visit, even if you were asked to fast for labs.   Atreus Hasz Delles, PharmD, BCACP, CPP Clinical Pharmacist [facility] [place] [phone]         

## 2023-05-23 NOTE — Progress Notes (Signed)
05/23/2023 Name: Craig Harvey MRN: [id number] DOB: 1955/10/28  Chief Complaint  Patient presents with   Medication Management    Craig Harvey is a 67 y.o. year old male who presented for a telephone visit.   They were referred to the pharmacist by their PCP for assistance in managing diabetes and hypertension.    Patient is participating in a Managed Medicaid Plan:  Yes, Engineer, civil (consulting)      Subjective:   Care Team: Primary Care Provider: Smitty Cords, DO; Next Scheduled Visit: 08/03/2023 Cardiologist: [facility] [place]  Medication Access/Adherence  Current Pharmacy:  [facility] - [place], [place] - [address] [place] [address] Phone: [phone] Fax: [phone]  [id number] - [address]), [place] - [address] [place]) [place] [address] Phone: [phone] Fax: [phone]   Patient reports affordability concerns with their medications: No  Patient reports access/transportation concerns to their pharmacy: No  Patient reports adherence concerns with their medications:  No     Uses weekly pillbox; denies missed doses   Enrolled in Dual Complete Advantage plan through [employer] starting 06/05/2023   Hypertension:   Patient followed by [facility]    Current medications:  - amlodipine 5 mg daily - metoprolol ER 25 mg daily - valsartan 320 mg daily - isosorbide ER 60 mg daily   Previous therapies tried: lisinopril, HCTZ (discontinued 12/2021 related to hyponatremia); amlodipine   Reports uses weekly pillbox to organize his medications; denies missed doses   Home Monitoring: Patient has an automated upper arm home BP machine Counsel on BP monitoring technique Current blood pressure readings:  - This morning: 144/73, HR 75 - Yesterday: AM: 126/70, HR 62; PM: 149/83, HR 72   Per patient and provider,  patient with history of labile blood pressure  Notes has had recent muscle/back pain on an off    Current physical activity: reports recently decreased due to back pain, but walking or riding bike ~30 minutes x 4 days/week   OSA/CPAP: Reports using CPAP   Limiting salt/sodium in his diet. Reviews nutrition labels for sodium content     Diabetes:   Current medications:  - Humalog Mix 75/25 - Injects 17 units twice daily with meals - metformin 1000 mg - 1/2 tablet (500 mg) twice daily   Current glucose readings: last checked this morning, reading: 97   Denies recent symptoms of hypoglycemia   Confirms has glucose tablets   Current physical activity: reports recently decreased due to back pain, but walking or riding bike ~30 minutes x 4 days/week     Hyperlipidemia/ASCVD Risk Reduction   Current lipid lowering medications: atorvastatin 80 mg; ezetimibe 10 mg daily Medications tried in the past:    Antiplatelet regimen: aspirin 81 mg; clopidogrel 75 mg daily   ASCVD History: CAD s/p CABG x 05 January 2015 and PCI with balloon angioplasty and DES proximal RCA June 2023  Risk Factors: hypertension, hyperlipidemia, obesity, OSA on CPAP, T2DM    Current physical activity: reports recently decreased due to back pain, but walking or riding bike ~30 minutes x 4 days/week     Health Maintenance - Discussed CDC/ACIP recommendations for Shingrix and Tetanus vaccines Received 2nd Shingles vaccine, but has not yet received Tetanus vaccine   Objective:  Lab Results  Component Value Date   HGBA1C 6.0 (A) 02/01/2023    Lab Results  Component Value Date  CREATININE 0.97 04/10/2023   BUN 12 04/10/2023   NA 130 (L) 04/10/2023   K 4.0 04/10/2023   CL 97 (L) 04/10/2023   CO2 23 04/10/2023    Lab Results  Component Value Date   CHOL 92 07/19/2022   HDL 37 (L) 07/19/2022   LDLCALC 41 07/19/2022   TRIG 55 07/19/2022   CHOLHDL 2.5 07/19/2022   BP Readings from Last 3 Encounters:   05/13/23 115/68  04/10/23 (!) 140/87  02/08/23 106/70   Pulse Readings from Last 3 Encounters:  05/13/23 67  04/10/23 66  02/08/23 70     Medications Reviewed Today     Reviewed by Manuela Neptune, RPH-CPP (Pharmacist) on 05/23/23 at 1419  Med List Status: <None>   Medication Order Taking? Sig Documenting Provider Last Dose Status Informant  Accu-Chek Softclix Lancets lancets [id number]  Use to check blood sugar up to 3 x daily Smitty Cords, DO  Active   amLODipine (NORVASC) 5 MG tablet [id number] Yes TAKE 1 TABLET BY MOUTH ONCE DAILY AT 5 PM Gollan, Tollie Pizza, MD Taking Active   aspirin EC 81 MG tablet [id number] Yes Take 1 tablet (81 mg total) by mouth daily. Antonieta Iba, MD Taking Active Pharmacy Records, Self  atorvastatin (LIPITOR) 80 MG tablet [id number] Yes Take 1 tablet (80 mg total) by mouth daily. Smitty Cords, DO Taking Active   baclofen (LIORESAL) 10 MG tablet [id number]  TAKE ONE-HALF TO ONE TABLET BY MOUTH TWICE DAILY AS NEEDED FOR MUSCLE SPASM (LEG PAIN) Smitty Cords, DO  Active   Blood Glucose Monitoring Suppl (ACCU-CHEK GUIDE ME) w/Device KIT [id number]  Use to check blood sugar up to 3 x daily Smitty Cords, DO  Active   clopidogrel (PLAVIX) 75 MG tablet [id number] Yes Take 1 tablet (75 mg total) by mouth daily. Charlsie Quest, NP Taking Active   docusate sodium (COLACE) 100 MG capsule [id number]  Take 100 mg by mouth 2 (two) times daily. [provider]  Active   ezetimibe (ZETIA) 10 MG tablet [id number] Yes Take 1 tablet by mouth once daily Smitty Cords, DO Taking Active   glucose blood (ACCU-CHEK GUIDE) test strip [id number]  Use to check blood sugar up to 3 x daily Karamalegos, Netta Neat, DO  Active   insulin lispro protamine-lispro (HUMALOG MIX 75/25) (75-25) 100 UNIT/ML SUSP injection [id number] Yes INJECT 17 UNITS UNDER THE SKIN TWICE DAILY WITH A MEAL. MAX OF 50 UNITS DAILY. Smitty Cords, DO Taking Active   Insulin Syringe-Needle U-100 (BD VEO INSULIN SYRINGE U/F) 31G X 15/64" 0.3 ML MISC [id number]  USE AS DIRECTED TWICE DAILY Althea Charon, Netta Neat, DO  Active   isosorbide mononitrate (IMDUR) 60 MG 24 hr tablet [id number] Yes Take 1 tablet by mouth once daily Smitty Cords, DO Taking Active   metFORMIN (GLUCOPHAGE) 1000 MG tablet [id number] Yes TAKE 1 TABLET BY MOUTH TWICE DAILY WITH A MEAL  Patient taking differently: Take 500 mg by mouth 2 (two) times daily with a meal.   Smitty Cords, DO Taking Active   metoCLOPramide (REGLAN) 10 MG tablet [id number] No TAKE 1 TABLET BY MOUTH EVERY 8 HOURS AS NEEDED FOR NAUSEA (HEART BURN)  Patient not taking: Reported on 05/23/2023   Smitty Cords, DO Not Taking Active   metoprolol succinate (TOPROL-XL) 25 MG 24 hr tablet [id number] Yes Take 1 tablet (25 mg total) by mouth daily. Smitty Cords, DO Taking Active   nitroGLYCERIN (NITROSTAT) 0.4 MG  SL tablet [id number]  Place 1 tablet (0.4 mg total) under the tongue every 5 (five) minutes as needed for chest pain. Antonieta Iba, MD  Active   pantoprazole (PROTONIX) 40 MG tablet [id number] Yes Take 1 tablet (40 mg total) by mouth daily before breakfast. Smitty Cords, DO Taking Active   sertraline (ZOLOFT) 50 MG tablet [id number]  Take 1 tablet (50 mg total) by mouth daily. Smitty Cords, DO  Active   [facility] injection [id number]  Inject 0.5 mL into muscle for shingles vaccine. Repeat dose in 2-6 months. Smitty Cords, DO  Active   Syringe, Disposable, 3 ML MISC [id number]  Use to inject humalog mix 75/25 17u BID Smitty Cords, DO  Active Pharmacy Records, Self  valsartan (DIOVAN) 320 MG tablet [id number] Yes Take 1 tablet (320 mg total) by mouth daily. Charlsie Quest, NP Taking Active               Assessment/Plan:   Encourage patient to follow up with pharmacy regarding receiving Tetanus  vaccine   Diabetes: - Reviewed long term cardiovascular and renal outcomes of uncontrolled blood sugar - Reviewed goal A1c, goal fasting, and goal 2 hour post prandial glucose - Reviewed dietary modifications including having regular, well-balanced meals, while controlling carbohydrate portion sizes - Have counseled patient on s/s of low blood sugar and how to treat lows - Recommend patient to continue to monitor home blood sugar, keep log of the results and bring this record with him to medical appointments     Hypertension: - Encourage patient to use CPAP consistently - Reviewed long term cardiovascular and renal outcomes of uncontrolled blood pressure - Recommended to continue to check home blood pressure and heart rate, keep log of the results and bring this record with him to medical appointments        Follow Up Plan: Clinical Pharmacist will follow up with patient by telephone on 09/26/2023 at 2:00 PM    Estelle Grumbles, PharmD, Patsy [place], CPP Clinical Pharmacist [facility] [phone]

## 2023-06-06 ENCOUNTER — Telehealth: Payer: Self-pay | Admitting: Cardiovascular Disease

## 2023-06-06 ENCOUNTER — Other Ambulatory Visit: Payer: Self-pay | Admitting: Family Medicine

## 2023-06-06 DIAGNOSIS — I10 Essential (primary) hypertension: Secondary | ICD-10-CM

## 2023-06-06 DIAGNOSIS — I2510 Atherosclerotic heart disease of native coronary artery without angina pectoris: Secondary | ICD-10-CM

## 2023-06-06 DIAGNOSIS — E1151 Type 2 diabetes mellitus with diabetic peripheral angiopathy without gangrene: Secondary | ICD-10-CM

## 2023-06-06 NOTE — Telephone Encounter (Signed)
Reviewed last cardiology office note dated 05/13/23.  Per NP note:  2.  Hypertension -Well-controlled at 115/68 -He reports controlled since ED visit -Continue logging blood pressure at home -Continue amlodipine 10mg , valsartan 320mg , metoprolol succinate 25mg    Will forward to NP, [facility], to clarify as it appears the patient is currently on amlodipine 5 mg once daily and this is also noted at his PCP visit on 05/23/23.   NP may need to be addended.

## 2023-06-07 NOTE — Telephone Encounter (Signed)
Denyce Robert, NP  Sent: Mon June 06, 2023  4:29 PM  To: Jefferey Pica, RN         Message  Yes he is on 5mg  amlodipine once daily

## 2023-06-09 NOTE — Telephone Encounter (Signed)
Requested by interface surescripts. Future visit in 1 month. Requested Prescriptions  Pending Prescriptions Disp Refills   ACCU-CHEK GUIDE TEST test strip [Pharmacy Med Name: Accu-Chek Guide In Vitro Strip] 100 each 0    Sig: USE TO CHECK BLOOD SUGAR UP TO 3 TIMES DAILY     There is no refill protocol information for this order     metoprolol succinate (TOPROL-XL) 25 MG 24 hr tablet [Pharmacy Med Name: Metoprolol Succinate ER 25 MG Oral Tablet Extended Release 24 Hour] 90 tablet 0    Sig: Take 1 tablet by mouth once daily     Cardiovascular:  Beta Blockers Passed - 06/06/2023 10:52 AM      Passed - Last BP in normal range    BP Readings from Last 1 Encounters:  05/13/23 115/68         Passed - Last Heart Rate in normal range    Pulse Readings from Last 1 Encounters:  05/13/23 67         Passed - Valid encounter within last 6 months    Recent Outpatient Visits           2 weeks ago Essential hypertension   [place] [facility] Delles, Gentry Fitz A, RPH-CPP   3 months ago DM (diabetes mellitus), type 2 with peripheral vascular complications [facility])   [place] [facility] Delles, Gentry Fitz A, RPH-CPP   4 months ago DM (diabetes mellitus), type 2 with peripheral vascular complications [facility])   [place] [facility] Smitty Cords, DO   7 months ago Essential hypertension   [place] [facility] Delles, Gentry Fitz A, RPH-CPP   7 months ago Gastroesophageal reflux disease without esophagitis   [place] [facility] [place], Netta Neat, DO       Future Appointments             In 1 month Althea Charon, Netta Neat, DO [place] [facility]   In 2 months Mariah Milling, Tollie Pizza, MD  HeartCare at [facility]             insulin lispro protamine-lispro (HUMALOG MIX 75/25) (75-25) 100 UNIT/ML SUSP injection [Pharmacy Med Name: HumaLOG Mix 75/25 (75-25)  100 UNIT/ML Subcutaneous Suspension] 30 mL 0    Sig: INJECT 17 UNITS SUBCUTANEOUSLY TWICE DAILY WITH A MEAL. MAX OF 50 UNITS DAILY.     Endocrinology:  Diabetes - Insulins Passed - 06/06/2023 10:52 AM      Passed - HBA1C is between 0 and 7.9 and within 180 days    Hemoglobin A1C  Date Value Ref Range Status  02/01/2023 6.0 (A) 4.0 - 5.6 % Final  04/18/2017 6.4  Final   Hgb A1c MFr Bld  Date Value Ref Range Status  07/19/2022 5.7 (H) <5.7 % of total Hgb Final    Comment:    For someone without known diabetes, a hemoglobin  A1c value between 5.7% and 6.4% is consistent with prediabetes and should be confirmed with a  follow-up test. . For someone with known diabetes, a value <7% indicates that their diabetes is well controlled. A1c targets should be individualized based on duration of diabetes, age, comorbid conditions, and other considerations. . This assay result is consistent with an increased risk of diabetes. . Currently, no consensus exists regarding use of hemoglobin A1c for diagnosis of diabetes for children. Verna Czech - Valid encounter  within last 6 months    Recent Outpatient Visits           2 weeks ago Essential hypertension   [place] [facility] Delles, Gentry Fitz A, RPH-CPP   3 months ago DM (diabetes mellitus), type 2 with peripheral vascular complications [facility])   [place] [facility] Delles, Gentry Fitz A, RPH-CPP   4 months ago DM (diabetes mellitus), type 2 with peripheral vascular complications [facility])   [place] Smitty Cords, DO   7 months ago Essential hypertension   [place] [facility] Delles, Gentry Fitz A, RPH-CPP   7 months ago Gastroesophageal reflux disease without esophagitis   [place] [facility] Smitty Cords, DO       Future Appointments             In 1 month Althea Charon, Netta Neat, DO [place]  [facility], [facility]   In 2 months Mariah Milling, Tollie Pizza, MD [facility] at [facility]

## 2023-06-09 NOTE — Telephone Encounter (Signed)
Requested medication (s) are due for refill today: expired medication   Requested medication (s) are on the active medication list: yes   Last refill:  05/17/22 #100 12 refills  Future visit scheduled: yes in 1 month  Notes to clinic:  expired medication do you want to renew Rx?     Requested Prescriptions  Pending Prescriptions Disp Refills   ACCU-CHEK GUIDE TEST test strip [Pharmacy Med Name: Accu-Chek Guide In Vitro Strip] 100 each 0    Sig: USE TO CHECK BLOOD SUGAR UP TO 3 TIMES DAILY     There is no refill protocol information for this order    Signed Prescriptions Disp Refills   metoprolol succinate (TOPROL-XL) 25 MG 24 hr tablet 90 tablet 0    Sig: Take 1 tablet by mouth once daily     Cardiovascular:  Beta Blockers Passed - 06/06/2023 10:52 AM      Passed - Last BP in normal range    BP Readings from Last 1 Encounters:  05/13/23 115/68         Passed - Last Heart Rate in normal range    Pulse Readings from Last 1 Encounters:  05/13/23 67         Passed - Valid encounter within last 6 months    Recent Outpatient Visits           2 weeks ago Essential hypertension   [place] [facility] Delles, Gentry Fitz A, RPH-CPP   3 months ago DM (diabetes mellitus), type 2 with peripheral vascular complications [facility])   [place] [facility] Delles, Gentry Fitz A, RPH-CPP   4 months ago DM (diabetes mellitus), type 2 with peripheral vascular complications [facility])   [place] [facility] Smitty Cords, DO   7 months ago Essential hypertension   [place] [facility] Delles, Gentry Fitz A, RPH-CPP   7 months ago Gastroesophageal reflux disease without esophagitis   [place] [facility] [place], Netta Neat, DO       Future Appointments             In 1 month Althea Charon, Netta Neat, DO [place] [facility], [facility]   In 2 months Mariah Milling, Tollie Pizza, MD [facility]             insulin lispro protamine-lispro (HUMALOG MIX 75/25) (75-25) 100 UNIT/ML SUSP injection 30 mL 0    Sig: INJECT 17 UNITS SUBCUTANEOUSLY TWICE DAILY WITH A MEAL. MAX OF 50 UNITS DAILY.     Endocrinology:  Diabetes - Insulins Passed - 06/06/2023 10:52 AM      Passed - HBA1C is between 0 and 7.9 and within 180 days    Hemoglobin A1C  Date Value Ref Range Status  02/01/2023 6.0 (A) 4.0 - 5.6 % Final  04/18/2017 6.4  Final   Hgb A1c MFr Bld  Date Value Ref Range Status  07/19/2022 5.7 (H) <5.7 % of total Hgb Final    Comment:    For someone without known diabetes, a hemoglobin  A1c value between 5.7% and 6.4% is consistent with prediabetes and should be confirmed with a  follow-up test. . For someone with known diabetes, a value <7% indicates that their diabetes is well controlled. A1c targets should be individualized based on duration of diabetes, age, comorbid conditions, and other considerations. . This assay result is consistent with an increased risk of diabetes. . Currently, no  consensus exists regarding use of hemoglobin A1c for diagnosis of diabetes for children. Verna Czech - Valid encounter within last 6 months    Recent Outpatient Visits           2 weeks ago Essential hypertension   [place] [facility] Delles, Gentry Fitz A, RPH-CPP   3 months ago DM (diabetes mellitus), type 2 with peripheral vascular complications [facility])   [place] [facility] Delles, Gentry Fitz A, RPH-CPP   4 months ago DM (diabetes mellitus), type 2 with peripheral vascular complications [facility])   [place] [facility] Smitty Cords, DO   7 months ago Essential hypertension   [place] [facility] Delles, Gentry Fitz A, RPH-CPP   7 months ago Gastroesophageal reflux disease without esophagitis   [place] [facility] Smitty Cords, DO        Future Appointments             In 1 month Althea Charon, Netta Neat, DO [place] [facility], [facility]   In 2 months Mariah Milling, Tollie Pizza, MD [facility] Heart[facility] at [facility]

## 2023-07-05 ENCOUNTER — Other Ambulatory Visit: Payer: Self-pay | Admitting: Family Medicine

## 2023-07-05 DIAGNOSIS — E1169 Type 2 diabetes mellitus with other specified complication: Secondary | ICD-10-CM

## 2023-07-08 ENCOUNTER — Ambulatory Visit: Payer: Self-pay

## 2023-07-08 NOTE — Telephone Encounter (Signed)
     Chief Complaint: Pt. Accidentally took his Valsartan  this morning instead of evening. Instructed he should be fine. Instructed to check BP today to monitor. Wants PCP aware.  Symptoms: Above Frequency: Today Pertinent Negatives: Patient denies symptoms Disposition: [] ED /[] Urgent Care (no appt availability in office) / [] Appointment(In office/virtual)/ []  [place] [facility]/ [] Home Care/ [] Refused Recommended Disposition /[] [place] [facility]/ [x]  Follow-up with PCP Additional Notes: Please advise pt.  Reason for Disposition  [1] Caller has URGENT medicine question about med that PCP or specialist prescribed AND [2] triager unable to answer question  Answer Assessment - Initial Assessment Questions 1. NAME of MEDICINE: What medicine(s) are you calling about?     Valsartan  2. QUESTION: What is your question? (e.g., double dose of medicine, side effect)     Took with morning pill 3. PRESCRIBER: Who prescribed the medicine? Reason: if prescribed by specialist, call should be referred to that group.     Dr. MARLA 4. SYMPTOMS: Do you have any symptoms? If Yes, ask: What symptoms are you having?  How bad are the symptoms (e.g., mild, moderate, severe)     No 5. PREGNANCY:  Is there any chance that you are pregnant? When was your last menstrual period?     N/a  Protocols used: Medication Question Call-A-AH

## 2023-07-08 NOTE — Telephone Encounter (Signed)
 Patient notified to take his medication as normal and to monitor his blood pressure. He will notify office of any changes in blood pressure

## 2023-07-08 NOTE — Telephone Encounter (Signed)
 Please call patient.  I reviewed the update. It is fine. He can restart taking medication at usual time.   2 options  1) he can skip the dose tonight and tomorrow morning, and then tomorrow (Saturday) night start back on usual regimen with one in evening.  2) he can take the usual dose tonight, and then skip tomorrow morning and he is back on schedule.  Either one is acceptable. If his BP runs higher than average lately I would suggest go ahead and just take it tonight (option 2).  If his BP has been running lower lately, he is worried about too much medicine in system at one time, I would suggest option 1.  Marsa Officer, DO [facility] 07/08/2023, 12:06 PM

## 2023-07-09 NOTE — Telephone Encounter (Signed)
 Requested Prescriptions  Pending Prescriptions Disp Refills   ezetimibe  (ZETIA ) 10 MG tablet [Pharmacy Med Name: Ezetimibe  10 MG Oral Tablet] 90 tablet 0    Sig: Take 1 tablet by mouth once daily     Cardiovascular:  Antilipid - Sterol Transport Inhibitors Failed - 07/09/2023 10:06 AM      Failed - Lipid Panel in normal range within the last 12 months    Cholesterol, Total  Date Value Ref Range Status  06/09/2016 158 100 - 199 mg/dL Final   Cholesterol  Date Value Ref Range Status  07/19/2022 92 <200 mg/dL Final   LDL Cholesterol (Calc)  Date Value Ref Range Status  07/19/2022 41 mg/dL (calc) Final    Comment:    Reference range: <100 . Desirable range <100 mg/dL for primary prevention;   <70 mg/dL for patients with CHD or diabetic patients  with > or = 2 CHD risk factors. SABRA LDL-C is now calculated using the Martin-Hopkins  calculation, which is a validated novel method providing  better accuracy than the Friedewald equation in the  estimation of LDL-C.  Gladis APPLETHWAITE et al. SANDREA. 7986;689(80): 2061-2068  (http://education.Quest[employer]ostics.com/faq/FAQ164)    HDL  Date Value Ref Range Status  07/19/2022 37 (L) > OR = 40 mg/dL Final  87/93/7982 46 >60 mg/dL Final   Triglycerides  Date Value Ref Range Status  07/19/2022 55 <150 mg/dL Final         Passed - AST in normal range and within 360 days    AST  Date Value Ref Range Status  07/19/2022 19 10 - 35 U/L Final         Passed - ALT in normal range and within 360 days    ALT  Date Value Ref Range Status  07/19/2022 19 9 - 46 U/L Final         Passed - Patient is not pregnant      Passed - Valid encounter within last 12 months    Recent Outpatient Visits           1 month ago Essential hypertension   [place] [facility] Delles, Sharyle A, RPH-CPP   4 months ago DM (diabetes mellitus), type 2 with peripheral vascular complications [facility])   [place] [facility] Delles,  Sharyle A, RPH-CPP   5 months ago DM (diabetes mellitus), type 2 with peripheral vascular complications [facility])   [place] [facility] Marsa PARAS, DO   8 months ago Essential hypertension   [place] [facility] Delles, Sharyle A, RPH-CPP   8 months ago Gastroesophageal reflux disease without esophagitis   [place] [facility] [place], Marsa PARAS, DO       Future Appointments             In 3 weeks [place], Marsa PARAS, DO [place] [facility], [facility]   In 1 month Gollan, Timothy J, MD [facility] at [facility]

## 2023-07-11 ENCOUNTER — Other Ambulatory Visit: Payer: Self-pay | Admitting: Internal Medicine

## 2023-07-11 DIAGNOSIS — E1151 Type 2 diabetes mellitus with diabetic peripheral angiopathy without gangrene: Secondary | ICD-10-CM

## 2023-07-12 ENCOUNTER — Other Ambulatory Visit: Payer: Self-pay | Admitting: Family Medicine

## 2023-07-12 DIAGNOSIS — I2510 Atherosclerotic heart disease of native coronary artery without angina pectoris: Secondary | ICD-10-CM

## 2023-07-12 DIAGNOSIS — I10 Essential (primary) hypertension: Secondary | ICD-10-CM

## 2023-07-13 NOTE — Telephone Encounter (Signed)
 Requested Prescriptions  Pending Prescriptions Disp Refills   isosorbide  mononitrate (IMDUR ) 60 MG 24 hr tablet [Pharmacy Med Name: ISOSORB MONO ER 60MG  TAB] 90 tablet 0    Sig: Take 1 tablet by mouth once daily     Cardiovascular:  Nitrates Passed - 07/13/2023  8:59 AM      Passed - Last BP in normal range    BP Readings from Last 1 Encounters:  05/13/23 115/68         Passed - Last Heart Rate in normal range    Pulse Readings from Last 1 Encounters:  05/13/23 67         Passed - Valid encounter within last 12 months    Recent Outpatient Visits           1 month ago Essential hypertension   [place] [facility] Delles, Sharyle A, RPH-CPP   4 months ago DM (diabetes mellitus), type 2 with peripheral vascular complications [facility])   [place] [facility] Delles, Sharyle A, RPH-CPP   5 months ago DM (diabetes mellitus), type 2 with peripheral vascular complications [facility])   [place] [facility] [place] Marsa PARAS, DO   8 months ago Essential hypertension   [place] [facility] Delles, Sharyle A, RPH-CPP   8 months ago Gastroesophageal reflux disease without esophagitis   [place] [facility] [place], Marsa PARAS, DO       Future Appointments             In 3 weeks [place], Marsa PARAS, DO  [facility], [facility]   In 1 month Gollan, Timothy J, MD [facility] at [facility]

## 2023-07-13 NOTE — Telephone Encounter (Signed)
 Requested Prescriptions  Pending Prescriptions Disp Refills   ACCU-CHEK GUIDE TEST test strip [Pharmacy Med Name: Accu-Chek Guide In Vitro Strip] 100 each 0    Sig: USE TO CHECK BLOOD SUGAR UP TO 3 TIMES DAILY     There is no refill protocol information for this order

## 2023-07-14 ENCOUNTER — Other Ambulatory Visit: Payer: Self-pay | Admitting: Family Medicine

## 2023-07-14 DIAGNOSIS — I10 Essential (primary) hypertension: Secondary | ICD-10-CM

## 2023-07-14 DIAGNOSIS — I2510 Atherosclerotic heart disease of native coronary artery without angina pectoris: Secondary | ICD-10-CM

## 2023-07-18 NOTE — Telephone Encounter (Signed)
 Disp Refills Start End   isosorbide  mononitrate (IMDUR ) 60 MG 24 hr tablet 90 tablet 0 07/13/2023 --   Sig - Route: Take 1 tablet by mouth once daily - Oral   Sent to pharmacy as: isosorbide  mononitrate (IMDUR ) 60 MG 24 hr tablet   E-Prescribing Status: Receipt confirmed by pharmacy (07/13/2023  8:59 AM EST)     Requested Prescriptions  Pending Prescriptions Disp Refills   isosorbide  mononitrate (IMDUR ) 60 MG 24 hr tablet [Pharmacy Med Name: ISOSORB MONO ER 60MG  TAB] 90 tablet 0    Sig: Take 1 tablet by mouth once daily     Cardiovascular:  Nitrates Passed - 07/18/2023 12:12 PM      Passed - Last BP in normal range    BP Readings from Last 1 Encounters:  05/13/23 115/68         Passed - Last Heart Rate in normal range    Pulse Readings from Last 1 Encounters:  05/13/23 67         Passed - Valid encounter within last 12 months    Recent Outpatient Visits           1 month ago Essential hypertension   [place] [facility] Delles, Sharyle A, RPH-CPP   4 months ago DM (diabetes mellitus), type 2 with peripheral vascular complications [facility])   [place] [facility] Delles, Sharyle A, RPH-CPP   5 months ago DM (diabetes mellitus), type 2 with peripheral vascular complications [facility])   [place] [facility] [place] Marsa PARAS, DO   8 months ago Essential hypertension   [place] [facility] Delles, Sharyle A, RPH-CPP   8 months ago Gastroesophageal reflux disease without esophagitis   [place] [facility] [place], Marsa PARAS, DO       Future Appointments             In 2 weeks [place], Marsa PARAS, DO  [facility], [facility]   In 4 weeks Perla, Timothy J, MD [facility] HeartCare at [facility]

## 2023-07-27 ENCOUNTER — Other Ambulatory Visit: Payer: 59

## 2023-07-27 DIAGNOSIS — I2510 Atherosclerotic heart disease of native coronary artery without angina pectoris: Secondary | ICD-10-CM

## 2023-07-27 DIAGNOSIS — E1151 Type 2 diabetes mellitus with diabetic peripheral angiopathy without gangrene: Secondary | ICD-10-CM | POA: Diagnosis not present

## 2023-07-27 DIAGNOSIS — E1169 Type 2 diabetes mellitus with other specified complication: Secondary | ICD-10-CM | POA: Diagnosis not present

## 2023-07-27 DIAGNOSIS — R351 Nocturia: Secondary | ICD-10-CM

## 2023-07-27 DIAGNOSIS — I10 Essential (primary) hypertension: Secondary | ICD-10-CM

## 2023-07-27 DIAGNOSIS — E785 Hyperlipidemia, unspecified: Secondary | ICD-10-CM | POA: Diagnosis not present

## 2023-07-27 DIAGNOSIS — Z0000 Encounter for general adult medical examination without abnormal findings: Secondary | ICD-10-CM

## 2023-07-28 LAB — CBC WITH DIFFERENTIAL/PLATELET
Absolute Lymphocytes: 946 {cells}/uL (ref 850–3900)
Absolute Monocytes: 479 {cells}/uL (ref 200–950)
Basophils Absolute: 30 {cells}/uL (ref 0–200)
Basophils Relative: 0.8 %
Eosinophils Absolute: 80 {cells}/uL (ref 15–500)
Eosinophils Relative: 2.1 %
HCT: 31.7 % — ABNORMAL LOW (ref 38.5–50.0)
Hemoglobin: 9 g/dL — ABNORMAL LOW (ref 13.2–17.1)
MCH: 20.1 pg — ABNORMAL LOW (ref 27.0–33.0)
MCHC: 28.4 g/dL — ABNORMAL LOW (ref 32.0–36.0)
MCV: 70.9 fL — ABNORMAL LOW (ref 80.0–100.0)
Monocytes Relative: 12.6 %
Neutro Abs: 2265 {cells}/uL (ref 1500–7800)
Neutrophils Relative %: 59.6 %
Platelets: 212 10*3/uL (ref 140–400)
RBC: 4.47 10*6/uL (ref 4.20–5.80)
RDW: 21.1 % — ABNORMAL HIGH (ref 11.0–15.0)
Total Lymphocyte: 24.9 %
WBC: 3.8 10*3/uL (ref 3.8–10.8)

## 2023-07-28 LAB — COMPLETE METABOLIC PANEL WITH GFR
AG Ratio: 1.4 (calc) (ref 1.0–2.5)
ALT: 52 U/L — ABNORMAL HIGH (ref 9–46)
AST: 43 U/L — ABNORMAL HIGH (ref 10–35)
Albumin: 4.1 g/dL (ref 3.6–5.1)
Alkaline phosphatase (APISO): 51 U/L (ref 35–144)
BUN: 12 mg/dL (ref 7–25)
CO2: 27 mmol/L (ref 20–32)
Calcium: 8.8 mg/dL (ref 8.6–10.3)
Chloride: 100 mmol/L (ref 98–110)
Creat: 0.85 mg/dL (ref 0.70–1.35)
Globulin: 3 g/dL (ref 1.9–3.7)
Glucose, Bld: 113 mg/dL — ABNORMAL HIGH (ref 65–99)
Potassium: 4.5 mmol/L (ref 3.5–5.3)
Sodium: 135 mmol/L (ref 135–146)
Total Bilirubin: 0.5 mg/dL (ref 0.2–1.2)
Total Protein: 7.1 g/dL (ref 6.1–8.1)
eGFR: 96 mL/min/{1.73_m2} (ref >=60)

## 2023-07-28 LAB — HEMOGLOBIN A1C
Hgb A1c MFr Bld: 6.6 %{Hb} — ABNORMAL HIGH (ref <5.7)
Mean Plasma Glucose: 143 mg/dL
eAG (mmol/L): 7.9 mmol/L

## 2023-07-28 LAB — TSH: TSH: 1.91 m[IU]/L (ref 0.40–4.50)

## 2023-07-28 LAB — PSA: PSA: 0.27 ng/mL (ref <=4.00)

## 2023-07-28 LAB — LIPID PANEL
Cholesterol: 94 mg/dL (ref <200)
HDL: 31 mg/dL — ABNORMAL LOW (ref >=40)
LDL Cholesterol (Calc): 49 mg/dL
Non-HDL Cholesterol (Calc): 63 mg/dL (ref <130)
Total CHOL/HDL Ratio: 3 (calc) (ref <5.0)
Triglycerides: 59 mg/dL (ref <150)

## 2023-07-28 LAB — MICROALBUMIN / CREATININE URINE RATIO
Creatinine, Urine: 115 mg/dL (ref 20–320)
Microalb Creat Ratio: 7 mg/g{creat} (ref <30)
Microalb, Ur: 0.8 mg/dL

## 2023-08-03 ENCOUNTER — Ambulatory Visit (INDEPENDENT_AMBULATORY_CARE_PROVIDER_SITE_OTHER): Payer: 59 | Admitting: Family Medicine

## 2023-08-03 ENCOUNTER — Encounter: Payer: Self-pay | Admitting: Family Medicine

## 2023-08-03 VITALS — BP 110/70 | HR 76 | Ht 68.0 in | Wt 317.0 lb

## 2023-08-03 DIAGNOSIS — D509 Iron deficiency anemia, unspecified: Secondary | ICD-10-CM | POA: Insufficient documentation

## 2023-08-03 DIAGNOSIS — I10 Essential (primary) hypertension: Secondary | ICD-10-CM

## 2023-08-03 DIAGNOSIS — Z23 Encounter for immunization: Secondary | ICD-10-CM

## 2023-08-03 DIAGNOSIS — Z0000 Encounter for general adult medical examination without abnormal findings: Secondary | ICD-10-CM | POA: Diagnosis not present

## 2023-08-03 DIAGNOSIS — E1169 Type 2 diabetes mellitus with other specified complication: Secondary | ICD-10-CM | POA: Diagnosis not present

## 2023-08-03 DIAGNOSIS — E1151 Type 2 diabetes mellitus with diabetic peripheral angiopathy without gangrene: Secondary | ICD-10-CM

## 2023-08-03 DIAGNOSIS — E785 Hyperlipidemia, unspecified: Secondary | ICD-10-CM | POA: Diagnosis not present

## 2023-08-03 DIAGNOSIS — I2510 Atherosclerotic heart disease of native coronary artery without angina pectoris: Secondary | ICD-10-CM | POA: Diagnosis not present

## 2023-08-03 DIAGNOSIS — G4733 Obstructive sleep apnea (adult) (pediatric): Secondary | ICD-10-CM

## 2023-08-03 DIAGNOSIS — F325 Major depressive disorder, single episode, in full remission: Secondary | ICD-10-CM

## 2023-08-03 DIAGNOSIS — Z951 Presence of aortocoronary bypass graft: Secondary | ICD-10-CM | POA: Diagnosis not present

## 2023-08-03 MED ORDER — AMLODIPINE BESYLATE 5 MG PO TABS: 5.0000 mg | ORAL_TABLET | Freq: Every day | ORAL | 3 refills | Status: AC

## 2023-08-03 MED ORDER — VALSARTAN 320 MG PO TABS: 320.0000 mg | ORAL_TABLET | Freq: Every day | ORAL | 3 refills | Status: AC

## 2023-08-03 MED ORDER — METFORMIN HCL 500 MG PO TABS: 500.0000 mg | ORAL_TABLET | Freq: Two times a day (BID) | ORAL | 3 refills | Status: DC

## 2023-08-03 MED ORDER — METOPROLOL SUCCINATE ER 25 MG PO TB24: 25.0000 mg | ORAL_TABLET | Freq: Every day | ORAL | 3 refills | Status: AC

## 2023-08-03 MED ORDER — EZETIMIBE 10 MG PO TABS: 10.0000 mg | ORAL_TABLET | Freq: Every day | ORAL | 3 refills | Status: AC

## 2023-08-03 NOTE — Progress Notes (Signed)
Subjective:    Patient ID: Craig Harvey, male    DOB: 11/07/1955, 68 y.o.   MRN: [id number]  Craig Harvey is a 68 y.o. male presenting on 08/03/2023 for No chief complaint on file.   HPI  Discussed the use of AI scribe software for clinical note transcription with the patient, who gave verbal consent to proceed.  History of Present Illness   He is a 68 year old male who presents for an annual physical exam.      Here for Annual Physical and Lab Review.  Elevated LFT Morbid Obesity BMI >48 Abnormal weight gain LFT mild elevated  Anemia, microcytic Hemoglobin 9.0 Up to date on colon CA Screening Denies dark stools or bleeding  CHRONIC DM, Type 2: A1c 6.6, elevated but still controlled Failed Trulicity due to side effect intolerance Stable CBGs Meds: Metformin 500mg  TWICE A DAY (half of 1000mg  tab), Humalog Vials 75/25 - currently at 17u BID wc Reports good compliance. Tolerating well w/o side-effects Currently on ACEi Lifestyle: - Diet (trying to improve diet - eliminated french fries, chocolate) UTD DM eye No significant hypoglycemia   Depression, moderate, single episode, remission Improved control on SSRI Sertraline   Hypertension History of Unstable angina s/p PCI stent Syncopal episode CAD / Vascular Disease / S/p CABG (2016) Followed by Cardiology Medications current - Amlodipine 5mg  daily, Metoprolol 25mg  XL Daily, Valsartan 320mg  daily He admits some chronic chest wall discomfort after CABG. Suspects it to be permanent injury. BP readings controlled Prior ECHO  He has risk factors with DM2, HYPERTENSION, HLD, OSA on CPAP   OSA on CPAP Improved OSA control on CPAP, he already has supplies Using nightly. Improves his sleep   He experiences weakness in his back, particularly in the buttocks area, when walking and sometimes upon waking. He describes it as a 'tired muscle' feeling, not associated with pain, but with a sensation of weakness. He has  gained weight recently and has started using a treadmill. No numbness, tingling, or burning in his feet. No dark stools or blood in his stool. He feels weak when walking, which may be related to low iron levels.   Health Maintenance:  High dose Flu Shot today.   Recommend Prevnar-20 in future for pneumonia coverage age 74+  Tdap due in future.   PSA 0.4 negative   Cologuard negative 07/12/22 (next due in 2 years) 2027     08/03/2023    1:14 PM 02/01/2023    4:13 PM 11/02/2022    3:19 PM  Depression screen PHQ 2/9  Decreased Interest 0 0 0  Down, Depressed, Hopeless 0 0 0  PHQ - 2 Score 0 0 0  Altered sleeping  0 0  Tired, decreased energy  0 0  Change in appetite  0 0  Feeling bad or failure about yourself   1 0  Trouble concentrating  0 0  Moving slowly or fidgety/restless  0 0  Suicidal thoughts  0 0  PHQ-9 Score  1 0  Difficult doing work/chores  Not difficult at all        08/03/2023    1:14 PM 02/01/2023    4:13 PM 11/02/2022    3:20 PM 07/26/2022    1:50 PM  GAD 7 : Generalized Anxiety Score  Nervous, Anxious, on Edge 0 0 0 0  Control/stop worrying 0 0 0 1  Worry too much - different things 0 1 0 1  Trouble relaxing 0 0 0 0  Restless 0 0 0 0  Easily annoyed or irritable 0 0 0 0  Afraid - awful might happen 0 1 0 0  Total GAD 7 Score 0 2 0 2  Anxiety Difficulty Not difficult at all   Not difficult at all     Past Medical History:  Diagnosis Date   CAD (coronary artery disease)    a. 01/2015 Ex MV: high risk; b. 01/2015 Cath: ostLAD 70, mLAD 99%, ost to pLCx 95%, mLCx 80%, LVEF nl; c. 01/2015 s/p 3v CABG (LIMA-LAD, SVG-OM, SVG-Ramus); d. 12/2021 PCI: LM 15/25, LAD 70ost, 100p/m, RI small, LCX 100ost, 80p/m, RCA 95p (3.5x15 Onyx Frontier DES), VG->OM2 nl, VG->RI 100p, LIMA-LAD nl   Diastolic dysfunction    a. 01/2015 Echo: EF 60-65%; b. 09/2022 Echo: EF 60-65%, no rwma, GrI DD, nl RV size/fxn, RVSP 26.25mmHg, mildly to mod dil LA. Ao root 40mm, Asc Ao 36mm.    Hyperlipidemia LDL goal <70    Hypertension    Morbid obesity (HCC)    OSA (obstructive sleep apnea)    Type II diabetes mellitus (HCC)    Past Surgical History:  Procedure Laterality Date   CARDIAC CATHETERIZATION N/A 01/09/2015   Procedure: Left Heart Cath and Coronary Angiography;  Surgeon: Antonieta Iba, MD;  Location: [facility] INVASIVE CV LAB;  Service: Cardiovascular;  Laterality: N/A;   CORONARY ARTERY BYPASS GRAFT N/A 01/14/2015   Procedure: CORONARY ARTERY BYPASS GRAFTING (CABG), ON PUMP, TIMES THREE, USING LEFT INTERNAL MAMMARY ARTERY, RIGHT GREATER SAPHENOUS VEIN HARVESTED ENDOSCOPICALLY;  Surgeon: Kerin Perna, MD;  Location: [facility];  Service: Open Heart Surgery;  Laterality: N/A;   CORONARY STENT INTERVENTION N/A 12/10/2021   Procedure: CORONARY STENT INTERVENTION;  Surgeon: Marykay Lex, MD;  Location: [facility] INVASIVE CV LAB;  Service: Cardiovascular;  Laterality: N/A;   HERNIA REPAIR     LEFT HEART CATH AND CORONARY ANGIOGRAPHY N/A 12/10/2021   Procedure: LEFT HEART CATH AND CORONARY ANGIOGRAPHY;  Surgeon: Marykay Lex, MD;  Location: [facility] INVASIVE CV LAB;  Service: Cardiovascular;  Laterality: N/A;   TEE WITHOUT CARDIOVERSION N/A 01/14/2015   Procedure: TRANSESOPHAGEAL ECHOCARDIOGRAM (TEE);  Surgeon: Kerin Perna, MD;  Location: [facility] OR;  Service: Open Heart Surgery;  Laterality: N/A;   Social History   Socioeconomic History   Marital status: Single    Spouse name: Not on file   Number of children: Not on file   Years of education: Not on file   Highest education level: Not on file  Occupational History   Occupation: Former Naval architect / Production designer, theatre/television/film  Tobacco Use   Smoking status: Former    Current packs/day: 0.00    Types: Cigarettes    Quit date: 08/06/1975    Years since quitting: 48.0   Smokeless tobacco: Never  Vaping Use   Vaping status: Never Used  Substance and Sexual Activity   Alcohol use: No   Drug use: No   Sexual activity: Yes  Other Topics  Concern   Not on file  Social History Narrative   Not on file   Social Drivers of Health   Financial Resource Strain: Low Risk  (05/20/2021)   Overall Financial Resource Strain (CARDIA)    Difficulty of Paying Living Expenses: Not hard at all  Food Insecurity: No Food Insecurity (05/20/2021)   Hunger Vital Sign    Worried About Running Out of Food in the Last Year: Never true    Ran Out of Food in the Last Year:  Never true  Transportation Needs: No Transportation Needs (05/20/2021)   PRAPARE - Administrator, Civil Service (Medical): No    Lack of Transportation (Non-Medical): No  Physical Activity: Sufficiently Active (05/20/2021)   Exercise Vital Sign    Days of Exercise per Week: 4 days    Minutes of Exercise per Session: 40 min  Stress: No Stress Concern Present (05/20/2021)   [employer] of Occupational Health - Occupational Stress Questionnaire    Feeling of Stress : Not at all  Social Connections: Socially Isolated (05/20/2021)   Social Connection and Isolation Panel [NHANES]    Frequency of Communication with Friends and Family: Three times a week    Frequency of Social Gatherings with Friends and Family: Three times a week    Attends Religious Services: Never    Active Member of Clubs or Organizations: No    Attends Banker Meetings: Never    Marital Status: Never married  Catering manager Violence: Not on file   Family History  Problem Relation Age of Onset   CAD Mother    Throat cancer Father    CAD Brother    Diabetes Neg Hx    Cancer Neg Hx    Prostate cancer Neg Hx    Colon cancer Neg Hx    Current Outpatient Medications on File Prior to Visit  Medication Sig   ACCU-CHEK GUIDE TEST test strip USE TO CHECK BLOOD SUGAR UP TO 3 TIMES DAILY   Accu-Chek Softclix Lancets lancets Use to check blood sugar up to 3 x daily   aspirin EC 81 MG tablet Take 1 tablet (81 mg total) by mouth daily.   atorvastatin (LIPITOR) 80 MG tablet Take  1 tablet (80 mg total) by mouth daily.   baclofen (LIORESAL) 10 MG tablet TAKE ONE-HALF TO ONE TABLET BY MOUTH TWICE DAILY AS NEEDED FOR MUSCLE SPASM (LEG PAIN)   Blood Glucose Monitoring Suppl (ACCU-CHEK GUIDE ME) w/Device KIT Use to check blood sugar up to 3 x daily   clopidogrel (PLAVIX) 75 MG tablet Take 1 tablet (75 mg total) by mouth daily.   docusate sodium (COLACE) 100 MG capsule Take 100 mg by mouth 2 (two) times daily.   insulin lispro protamine-lispro (HUMALOG MIX 75/25) (75-25) 100 UNIT/ML SUSP injection INJECT 17 UNITS SUBCUTANEOUSLY TWICE DAILY WITH A MEAL. MAX OF 50 UNITS DAILY.   Insulin Syringe-Needle U-100 (BD VEO INSULIN SYRINGE U/F) 31G X 15/64" 0.3 ML MISC USE AS DIRECTED TWICE DAILY   isosorbide mononitrate (IMDUR) 60 MG 24 hr tablet Take 1 tablet by mouth once daily   nitroGLYCERIN (NITROSTAT) 0.4 MG SL tablet Place 1 tablet (0.4 mg total) under the tongue every 5 (five) minutes as needed for chest pain.   pantoprazole (PROTONIX) 40 MG tablet Take 1 tablet (40 mg total) by mouth daily before breakfast.   sertraline (ZOLOFT) 50 MG tablet Take 1 tablet (50 mg total) by mouth daily.   SHINGRIX injection Inject 0.5 mL into muscle for shingles vaccine. Repeat dose in 2-6 months.   Syringe, Disposable, 3 ML MISC Use to inject humalog mix 75/25 17u BID   metoCLOPramide (REGLAN) 10 MG tablet TAKE 1 TABLET BY MOUTH EVERY 8 HOURS AS NEEDED FOR NAUSEA (HEART BURN) (Patient not taking: Reported on 08/03/2023)   No current facility-administered medications on file prior to visit.    Review of Systems  Constitutional:  Negative for activity change, appetite change, chills, diaphoresis, fatigue and fever.  HENT:  Negative for  congestion and hearing loss.   Eyes:  Negative for visual disturbance.  Respiratory:  Negative for cough, chest tightness, shortness of breath and wheezing.   Cardiovascular:  Negative for chest pain, palpitations and leg swelling.  Gastrointestinal:  Negative for  abdominal pain, constipation, diarrhea, nausea and vomiting.  Genitourinary:  Negative for dysuria, frequency and hematuria.  Musculoskeletal:  Negative for arthralgias and neck pain.  Skin:  Negative for rash.  Neurological:  Negative for dizziness, weakness, light-headedness, numbness and headaches.  Hematological:  Negative for adenopathy.  Psychiatric/Behavioral:  Negative for behavioral problems, dysphoric mood and sleep disturbance.    Per HPI unless specifically indicated above     Objective:    BP 110/70   Pulse 76   Ht 5\' 8"  (1.727 m)   Wt (!) 317 lb (143.8 kg)   SpO2 97%   BMI 48.20 kg/m   Wt Readings from Last 3 Encounters:  08/03/23 (!) 317 lb (143.8 kg)  05/13/23 (!) 305 lb 6.4 oz (138.5 kg)  04/10/23 289 lb (131.1 kg)    Physical Exam Vitals and nursing note reviewed.  Constitutional:      General: He is not in acute distress.    Appearance: He is well-developed. He is obese. He is not diaphoretic.     Comments: Well-appearing, comfortable, cooperative  HENT:     Head: Normocephalic and atraumatic.  Eyes:     General:        Right eye: No discharge.        Left eye: No discharge.     Conjunctiva/sclera: Conjunctivae normal.     Pupils: Pupils are equal, round, and reactive to light.  Neck:     Thyroid: No thyromegaly.     Vascular: No carotid bruit.  Cardiovascular:     Rate and Rhythm: Normal rate and regular rhythm.     Pulses: Normal pulses.     Heart sounds: Normal heart sounds. No murmur heard. Pulmonary:     Effort: Pulmonary effort is normal. No respiratory distress.     Breath sounds: Normal breath sounds. No wheezing or rales.  Abdominal:     General: Bowel sounds are normal. There is no distension.     Palpations: Abdomen is soft. There is no mass.     Tenderness: There is no abdominal tenderness.  Musculoskeletal:        General: No tenderness. Normal range of motion.     Cervical back: Normal range of motion and neck supple.     Right  lower leg: No edema.     Left lower leg: No edema.     Comments: Localized R buttock near piriformis area with localized symptoms but no spasm or tender to touch.  Upper / Lower Extremities: - Normal muscle tone, strength bilateral upper extremities 5/5, lower extremities 5/5  Lymphadenopathy:     Cervical: No cervical adenopathy.  Skin:    General: Skin is warm and dry.     Findings: No erythema or rash.  Neurological:     Mental Status: He is alert and oriented to person, place, and time.     Comments: Distal sensation intact to light touch all extremities  Psychiatric:        Mood and Affect: Mood normal.        Behavior: Behavior normal.        Thought Content: Thought content normal.     Comments: Well groomed, good eye contact, normal speech and thoughts     Diabetic  Foot Exam - Simple   Simple Foot Form Diabetic Foot exam was performed with the following findings: Yes 08/03/2023  1:28 PM  Visual Inspection No deformities, no ulcerations, no other skin breakdown bilaterally: Yes Sensation Testing Intact to touch and monofilament testing bilaterally: Yes Pulse Check Posterior Tibialis and Dorsalis pulse intact bilaterally: Yes Comments      Results for orders placed or performed in visit on 07/27/23  Urine Microalbumin w/creat. ratio   Collection Time: 07/27/23  9:13 AM  Result Value Ref Range   Creatinine, Urine 115 20 - 320 mg/dL   Microalb, Ur 0.8 mg/dL   Microalb Creat Ratio 7 <30 mg/g creat  TSH   Collection Time: 07/27/23  9:13 AM  Result Value Ref Range   TSH 1.91 0.40 - 4.50 mIU/L  PSA   Collection Time: 07/27/23  9:13 AM  Result Value Ref Range   PSA 0.27 < OR = 4.00 ng/mL  Lipid panel   Collection Time: 07/27/23  9:13 AM  Result Value Ref Range   Cholesterol 94 <200 mg/dL   HDL 31 (L) > OR = 40 mg/dL   Triglycerides 59 <540 mg/dL   LDL Cholesterol (Calc) 49 mg/dL (calc)   Total CHOL/HDL Ratio 3.0 <5.0 (calc)   Non-HDL Cholesterol (Calc) 63 <981  mg/dL (calc)  CBC with Differential/Platelet   Collection Time: 07/27/23  9:13 AM  Result Value Ref Range   WBC 3.8 3.8 - 10.8 Thousand/uL   RBC 4.47 4.20 - 5.80 Million/uL   Hemoglobin 9.0 (L) 13.2 - 17.1 g/dL   HCT 19.1 (L) 47.8 - 29.5 %   MCV 70.9 (L) 80.0 - 100.0 fL   MCH 20.1 (L) 27.0 - 33.0 pg   MCHC 28.4 (L) 32.0 - 36.0 g/dL   RDW 62.1 (H) 30.8 - 65.7 %   Platelets 212 140 - 400 Thousand/uL   MPV  7.5 - 12.5 fL   Neutro Abs 2,265 1,500 - 7,800 cells/uL   Absolute Lymphocytes 946 850 - 3,900 cells/uL   Absolute Monocytes 479 200 - 950 cells/uL   Eosinophils Absolute 80 15 - 500 cells/uL   Basophils Absolute 30 0 - 200 cells/uL   Neutrophils Relative % 59.6 %   Total Lymphocyte 24.9 %   Monocytes Relative 12.6 %   Eosinophils Relative 2.1 %   Basophils Relative 0.8 %  Hemoglobin A1c   Collection Time: 07/27/23  9:13 AM  Result Value Ref Range   Hgb A1c MFr Bld 6.6 (H) <5.7 % of total Hgb   Mean Plasma Glucose 143 mg/dL   eAG (mmol/L) 7.9 mmol/L  COMPLETE METABOLIC PANEL WITH GFR   Collection Time: 07/27/23  9:13 AM  Result Value Ref Range   Glucose, Bld 113 (H) 65 - 99 mg/dL   BUN 12 7 - 25 mg/dL   Creat 8.46 9.62 - 9.52 mg/dL   eGFR 96 > OR = 60 WU/XLK/4.40N0   BUN/Creatinine Ratio SEE NOTE: 6 - 22 (calc)   Sodium 135 135 - 146 mmol/L   Potassium 4.5 3.5 - 5.3 mmol/L   Chloride 100 98 - 110 mmol/L   CO2 27 20 - 32 mmol/L   Calcium 8.8 8.6 - 10.3 mg/dL   Total Protein 7.1 6.1 - 8.1 g/dL   Albumin 4.1 3.6 - 5.1 g/dL   Globulin 3.0 1.9 - 3.7 g/dL (calc)   AG Ratio 1.4 1.0 - 2.5 (calc)   Total Bilirubin 0.5 0.2 - 1.2 mg/dL   Alkaline phosphatase (APISO) 51  35 - 144 U/L   AST 43 (H) 10 - 35 U/L   ALT 52 (H) 9 - 46 U/L      Assessment & Plan:   Problem List Items Addressed This Visit     CAD (coronary artery disease), native coronary artery   Relevant Medications   amLODipine (NORVASC) 5 MG tablet   metoprolol succinate (TOPROL-XL) 25 MG 24 hr tablet    valsartan (DIOVAN) 320 MG tablet   ezetimibe (ZETIA) 10 MG tablet   CAD s/p CABG x 3   DM (diabetes mellitus), type 2 with peripheral vascular complications (HCC)   Relevant Medications   amLODipine (NORVASC) 5 MG tablet   metFORMIN (GLUCOPHAGE) 500 MG tablet   metoprolol succinate (TOPROL-XL) 25 MG 24 hr tablet   valsartan (DIOVAN) 320 MG tablet   ezetimibe (ZETIA) 10 MG tablet   Essential hypertension   Relevant Medications   amLODipine (NORVASC) 5 MG tablet   metoprolol succinate (TOPROL-XL) 25 MG 24 hr tablet   valsartan (DIOVAN) 320 MG tablet   ezetimibe (ZETIA) 10 MG tablet   Hyperlipidemia associated with type 2 diabetes mellitus (HCC)   Relevant Medications   amLODipine (NORVASC) 5 MG tablet   metFORMIN (GLUCOPHAGE) 500 MG tablet   metoprolol succinate (TOPROL-XL) 25 MG 24 hr tablet   valsartan (DIOVAN) 320 MG tablet   ezetimibe (ZETIA) 10 MG tablet   Major depression in remission (HCC)   Microcytic anemia   Relevant Orders   Ambulatory referral to Hematology / Oncology   OSA on CPAP   Other Visit Diagnoses       Annual physical exam    -  Primary     Needs flu shot       Relevant Orders   Flu Vaccine Trivalent High Dose (Fluad) (Completed)        Updated Health Maintenance information Reviewed recent lab results with patient Encouraged improvement to lifestyle with diet and exercise Goal of weight loss  Diabetes Mellitus HbA1c 6.6, controlled. Discussed potential benefits of newer medications for weight loss and cardiovascular health. -Adjust rx for Metformin to 500mg  BID. (Instead of half of 1000mg  tabs) -Consider newer medications (Zepbound, Ozempic, Rybelsus) for potential weight loss and cardiovascular benefits.  Anemia, microcytic Symptomatic with fatigue at times Hemoglobin 9.0, indicating iron deficiency anemia. No reported symptoms of GI bleeding. Up to date on colon CA screening. -Referral to Hematology for further evaluation and  management.  Musculoskeletal discomfort Reports of localized weakness in the buttock area, particularly with walking and prolonged standing. No reported numbness or tingling. Likely positional issue given circumstantial symptoms. Reviewed goal for weight loss -Consider taking existing rx Baclofen for additional relief.  Liver function Mildly elevated liver enzymes, likely secondary to weight gain. -Plan to recheck in the future.  General Health Maintenance / Vaccinations -Administer influenza vaccine today. -Recommend Prevnar 20 pneumonia vaccine at pharmacy. -Recommend future Tdap vaccine. -Recommend diabetic eye exam, report to be sent to our office or bring a copy. -Continue with colon cancer screening, next due in 2027.  Follow-up in 6 months, or sooner if patient decides to try new diabetes medications or if musculoskeletal discomfort worsens.        Orders Placed This Encounter  Procedures   Flu Vaccine Trivalent High Dose (Fluad)   Ambulatory referral to Hematology / Oncology    Referral Priority:   Routine    Referral Type:   Consultation    Referral Reason:   Specialty Services Required  Requested Specialty:   Oncology    Number of Visits Requested:   1    Meds ordered this encounter  Medications   amLODipine (NORVASC) 5 MG tablet    Sig: Take 1 tablet (5 mg total) by mouth daily.    Dispense:  90 tablet    Refill:  3   metFORMIN (GLUCOPHAGE) 500 MG tablet    Sig: Take 1 tablet (500 mg total) by mouth 2 (two) times daily with a meal.    Dispense:  180 tablet    Refill:  3   metoprolol succinate (TOPROL-XL) 25 MG 24 hr tablet    Sig: Take 1 tablet (25 mg total) by mouth daily.    Dispense:  90 tablet    Refill:  3   valsartan (DIOVAN) 320 MG tablet    Sig: Take 1 tablet (320 mg total) by mouth daily.    Dispense:  90 tablet    Refill:  3   ezetimibe (ZETIA) 10 MG tablet    Sig: Take 1 tablet (10 mg total) by mouth daily.    Dispense:  90 tablet     Refill:  3     Follow up plan: Return for 6 month DM A1c.  Saralyn Pilar, DO [facility] 08/03/2023, 1:21 PM

## 2023-08-03 NOTE — Patient Instructions (Addendum)
Thank you for coming to the office today.  Recommend Diabetic Eye Exam sent to our office or bring a copy  Flu Shot today  Recommend vaccines at the pharmacy -Prevnar-20 pneumonia vaccine -Tdap   Referral to Hematology for Anemia. Hgb 9.0, this means low iron deficiency. May need to be treated and find a source. This can cause weakness, fatigue, shortness of breath.  [facility] (Hematology/Oncology) [address] [place], [place] [address] Phone: [phone]   Consider the new medications for Type 2 Diabetes and Weight Management.  Weekly shots  Mounjaro Ozempic -----------------------  Rybelsus (daily pill)  Check into these for cost coverage   Please schedule a Follow-up Appointment to: Return for 6 month DM A1c.  If you have any other questions or concerns, please feel free to call the office or send a message through MyChart. You may also schedule an earlier appointment if necessary.  Additionally, you may be receiving a survey about your experience at our office within a few days to 1 week by e-mail or mail. We value your feedback.  Saralyn Pilar, DO [facility], [place]

## 2023-08-07 ENCOUNTER — Other Ambulatory Visit: Payer: Self-pay | Admitting: Family Medicine

## 2023-08-07 DIAGNOSIS — E1151 Type 2 diabetes mellitus with diabetic peripheral angiopathy without gangrene: Secondary | ICD-10-CM

## 2023-08-09 NOTE — Telephone Encounter (Signed)
 Requested Prescriptions  Pending Prescriptions Disp Refills   insulin  lispro protamine -lispro (HUMALOG  MIX 75/25) (75-25) 100 UNIT/ML SUSP injection [Pharmacy Med Name: HumaLOG  Mix 75/25 (75-25) 100 UNIT/ML Subcutaneous Suspension] 30 mL 0    Sig: INJECT 17 UNITS UNDER THE SKIN TWICE DAILY WITH A MEAL *MAX OF 50 UNITS DAILY*     Endocrinology:  Diabetes - Insulins Passed - 08/09/2023  9:16 AM      Passed - HBA1C is between 0 and 7.9 and within 180 days    Hemoglobin A1C  Date Value Ref Range Status  04/18/2017 6.4  Final   Hgb A1c MFr Bld  Date Value Ref Range Status  07/27/2023 6.6 (H) <5.7 % of total Hgb Final    Comment:    For someone without known diabetes, a hemoglobin A1c value of 6.5% or greater indicates that they may have  diabetes and this should be confirmed with a follow-up  test. . For someone with known diabetes, a value <7% indicates  that their diabetes is well controlled and a value  greater than or equal to 7% indicates suboptimal  control. A1c targets should be individualized based on  duration of diabetes, age, comorbid conditions, and  other considerations. . Currently, no consensus exists regarding use of hemoglobin A1c for diagnosis of diabetes for children. SABRA Amy - Valid encounter within last 6 months    Recent Outpatient Visits           6 days ago Annual physical exam   [place] [facility] [place] Marsa PARAS, DO   2 months ago Essential hypertension   [place] [facility] Delles, Sharyle A, RPH-CPP   5 months ago DM (diabetes mellitus), type 2 with peripheral vascular complications [facility])   [place] [facility] Delles, Sharyle A, RPH-CPP   6 months ago DM (diabetes mellitus), type 2 with peripheral vascular complications [facility])   [place] [facility] [place] Marsa PARAS, DO   9 months ago Essential hypertension   [place] [facility] Delles, Sharyle LABOR, RPH-CPP       Future Appointments             In 6 days Gollan, Timothy J, MD [facility] [place]   In 5 months Edman, Marsa PARAS, DO [place] [facility]

## 2023-08-14 NOTE — Progress Notes (Signed)
 Cardiology Office Note  Date:  08/15/2023   ID:  Craig Harvey, DOB 10/17/1955, MRN [id number]  PCP:  Raina Bunting, DO   Chief Complaint  Patient presents with   3 month follow up     "Doing well."     HPI:  68 year old male with known history of  HTN,  Morbid obesity OSA not on CPAP,  CAD s/p 3 vessel CABG on 01/14/2015,  DM,  HLD  Cath 6/23 for chest pain, PCI to the RCA, occluded vein graft to the ramus/high OM Echo March 2024 EF 60 to 65% who presents for routine follow-up of his coronary artery disease, diabetes, hyperlipidemia  Last seen in clinic by myself  7/23  Seen in the ER 10/24 with chest pain watching basketball game. He noticed a slight feeling of tingling or burning in his chest. He checked his blood pressure and noticed it was slightly elevated then checked again his blood pressure kept going up each time he checked it. During this period he started noticed a feeling of slight pressure in his chest. Did not radiate no nausea or vomiting no shortness of breath. He took 4 baby aspirin  and a spray of nitroglycerin . He reports his chest symptoms have improved.  Went to the ER, ER w/u negative  Reports having some shortness of breath on exertion No regular exercise program  Concerned about periods when blood pressure runs high Seems to happen when he is stressed Weight trending higher over the past 6 months 305 up to 319 Reports that primary care has discussed GLP-1 medication with him, he is reluctant so far, concerned about side effects  Lab work reviewed HGB 9.0, Restarted iron Does cologuard, no recent colonoscopy  Lab work reviewed A1c 6.6 Total cholesterol 94 LDL 49  Cardiac cath 12/10/21 reviewed with him again today   -----------------LEFT CORONARY---------------------------   Mid LM to Dist LM lesion is 15% stenosed with 25% stenosed side branch in Ramus.   Ost LAD to Prox LAD lesion is 70% stenosed.  Prox LAD to Mid LAD lesion is 100%  stenosed.   Ost Cx to Prox Cx lesion is 100% stenosed.   Prox Cx to Mid Cx lesion is 80% stenosed.   -----------------GRAFTS---------------------------   LIMA graft was visualized by angiography and is large.  The graft exhibits no disease.  There is no competitive flow   SVG-OM graft was visualized by angiography. The graft exhibits no disease.   SVG-RI gaft was visulalized by angiograph => Origin to Prox Graft lesion is 100% stenosed.   ------------------------------------------------------   -----------------RIGHT CORONARY---------------------------   CULPRIT LESION: Prox RCA lesion is 95% stenosed.   Balloon angioplasty was performed using sequential BALLN SCOREFLEX Balloons 3.0 x 10 & 3.50X10.   A drug-eluting stent was successfully placed using a STENT ONYX FRONTIER 3.5X15 -> postdilated to 4.1 mm   Post intervention, there is a 0% residual stenosis.   ------------------------------------------------------   The left ventricular systolic function is normal.   LV end diastolic pressure is normal.   SUMMARY Severe Native CAD:  100% CTO of proximal LCx after OM1/RI, 90% followed 100% CTO of LAD after SP1;  Widely patent ostial RI CULPRIT LESION 95% focal (napkin ring) proximal RCA Successful scoring balloon (sequential 3.0 mm & 3.5 mm ScoreFlex) angioplasty followed by => DES PCI of proximal RCA Onyx Frontier DES 3.5 mm x 15 mm postdilated to 4.1 mm. 95% reduced to 0% stenosis; TIMI-3 flow pre and post.   Distal  LAD has tandem 50% lesions, distal LCx with retrograde flow reveals 80% stenosis prior to anastomosis. 2 of 3 grafts patent: SVG-OM, LIMA-LAD with flush occlusion of SVG-RI   Other past medical history presented to [facility] on 01/06/2015 with complaints of nausea, feeling hot, and chest pain with radiation into his left shoulder.  EKG was unremarkable in ED and Cardiac enzymes were negative.   Echo on 7/4 showed EF 60-65%, no regional wall motion abnormalities, no valvular  abnormalities, PASP normal, normal study.  He underwent nuclear stress test that showed large defect of moderate severity in the mid anterior, mid anteroseptal, mid anterolateral, apical anterior, apical lateral and apex locations.    Catheterization prior to CABG  underwent cardiac catheterization which showed severe 2 vessel CAD (ost LAD 70%, mid LAD 99%, ost LCx to prox LCx 95% after the take off of OM1, mid LCx 80% prior to takeoff to OM2, normal LV function).    He underwent successful 3 vessel CABG (LIMA-LAD, SVG-OM, SVG-Ramus) on 01/14/2015. Post-op he developed Afib and converted on IV amiodarone . His A1C was poorly controlled coming in at 10.0%.     PMH:   has a past medical history of CAD (coronary artery disease), Diastolic dysfunction, Hyperlipidemia LDL goal <70, Hypertension, Morbid obesity (HCC), OSA (obstructive sleep apnea), and Type II diabetes mellitus (HCC).  PSH:    Past Surgical History:  Procedure Laterality Date   CARDIAC CATHETERIZATION N/A 01/09/2015   Procedure: Left Heart Cath and Coronary Angiography;  Surgeon: Devorah Fonder, MD;  Location: [facility] INVASIVE CV LAB;  Service: Cardiovascular;  Laterality: N/A;   CORONARY ARTERY BYPASS GRAFT N/A 01/14/2015   Procedure: CORONARY ARTERY BYPASS GRAFTING (CABG), ON PUMP, TIMES THREE, USING LEFT INTERNAL MAMMARY ARTERY, RIGHT GREATER SAPHENOUS VEIN HARVESTED ENDOSCOPICALLY;  Surgeon: Heriberto London, MD;  Location: [facility] OR;  Service: Open Heart Surgery;  Laterality: N/A;   CORONARY STENT INTERVENTION N/A 12/10/2021   Procedure: CORONARY STENT INTERVENTION;  Surgeon: Arleen Lacer, MD;  Location: [facility] INVASIVE CV LAB;  Service: Cardiovascular;  Laterality: N/A;   HERNIA REPAIR     LEFT HEART CATH AND CORONARY ANGIOGRAPHY N/A 12/10/2021   Procedure: LEFT HEART CATH AND CORONARY ANGIOGRAPHY;  Surgeon: Arleen Lacer, MD;  Location: [facility] INVASIVE CV LAB;  Service: Cardiovascular;  Laterality: N/A;   TEE WITHOUT CARDIOVERSION N/A  01/14/2015   Procedure: TRANSESOPHAGEAL ECHOCARDIOGRAM (TEE);  Surgeon: Heriberto London, MD;  Location: [facility] OR;  Service: Open Heart Surgery;  Laterality: N/A;    Current Outpatient Medications  Medication Sig Dispense Refill   ACCU-CHEK GUIDE TEST test strip USE TO CHECK BLOOD SUGAR UP TO 3 TIMES DAILY 100 each 0   Accu-Chek Softclix Lancets lancets Use to check blood sugar up to 3 x daily 100 each 12   amLODipine  (NORVASC ) 5 MG tablet Take 1 tablet (5 mg total) by mouth daily. 90 tablet 3   aspirin  EC 81 MG tablet Take 1 tablet (81 mg total) by mouth daily. 90 tablet 3   atorvastatin  (LIPITOR ) 80 MG tablet Take 1 tablet (80 mg total) by mouth daily. 90 tablet 3   baclofen  (LIORESAL ) 10 MG tablet TAKE ONE-HALF TO ONE TABLET BY MOUTH TWICE DAILY AS NEEDED FOR MUSCLE SPASM (LEG PAIN) 180 tablet 1   Blood Glucose Monitoring Suppl (ACCU-CHEK GUIDE ME) w/Device KIT Use to check blood sugar up to 3 x daily 1 kit 0   clopidogrel  (PLAVIX ) 75 MG tablet Take 1 tablet (75 mg total) by  mouth daily. 90 tablet 3   docusate sodium  (COLACE) 100 MG capsule Take 100 mg by mouth 2 (two) times daily.     ezetimibe  (ZETIA ) 10 MG tablet Take 1 tablet (10 mg total) by mouth daily. 90 tablet 3   insulin  lispro protamine -lispro (HUMALOG  MIX 75/25) (75-25) 100 UNIT/ML SUSP injection INJECT 17 UNITS UNDER THE SKIN TWICE DAILY WITH A MEAL *MAX OF 50 UNITS DAILY* 30 mL 1   Insulin  Syringe-Needle U-100 (BD VEO INSULIN  SYRINGE U/F) 31G X 15/64" 0.3 ML MISC USE AS DIRECTED TWICE DAILY 200 each 3   isosorbide  mononitrate (IMDUR ) 60 MG 24 hr tablet Take 1 tablet by mouth once daily 90 tablet 0   metFORMIN  (GLUCOPHAGE ) 500 MG tablet Take 1 tablet (500 mg total) by mouth 2 (two) times daily with a meal. 180 tablet 3   metoprolol  succinate (TOPROL -XL) 25 MG 24 hr tablet Take 1 tablet (25 mg total) by mouth daily. 90 tablet 3   nitroGLYCERIN  (NITROSTAT ) 0.4 MG SL tablet Place 1 tablet (0.4 mg total) under the tongue every 5 (five)  minutes as needed for chest pain. 25 tablet 3   pantoprazole  (PROTONIX ) 40 MG tablet Take 1 tablet (40 mg total) by mouth daily before breakfast. 90 tablet 3   sertraline  (ZOLOFT ) 50 MG tablet Take 1 tablet (50 mg total) by mouth daily. 90 tablet 3   SHINGRIX  injection Inject 0.5 mL into muscle for shingles vaccine. Repeat dose in 2-6 months. 0.5 mL 1   Syringe, Disposable, 3 ML MISC Use to inject humalog  mix 75/25 17u BID 200 each 5   valsartan  (DIOVAN ) 320 MG tablet Take 1 tablet (320 mg total) by mouth daily. 90 tablet 3   metoCLOPramide  (REGLAN ) 10 MG tablet TAKE 1 TABLET BY MOUTH EVERY 8 HOURS AS NEEDED FOR NAUSEA (HEART BURN) (Patient not taking: Reported on 08/15/2023) 30 tablet 0   No current facility-administered medications for this visit.   Allergies:   Trulicity  [dulaglutide ]   Social History:  The patient  reports that he quit smoking about 48 years ago. His smoking use included cigarettes. He has never used smokeless tobacco. He reports that he does not drink alcohol and does not use drugs.   Family History:   family history includes CAD in his brother and mother; Throat cancer in his father.    Review of Systems: Review of Systems  Constitutional: Negative.   Respiratory: Negative.    Cardiovascular: Negative.   Gastrointestinal: Negative.   Musculoskeletal:  Positive for back pain.  Neurological: Negative.   Psychiatric/Behavioral: Negative.    All other systems reviewed and are negative.   PHYSICAL EXAM: VS:  BP 128/70 (BP Location: Left Arm, Patient Position: Sitting, Cuff Size: Large)   Pulse 62   Ht 5\' 8"  (1.727 m)   Wt (!) 319 lb 6 oz (144.9 kg)   SpO2 97%   BMI 48.56 kg/m  , BMI Body mass index is 48.56 kg/m. Constitutional:  oriented to person, place, and time. No distress.  HENT:  Head: Grossly normal Eyes:  no discharge. No scleral icterus.  Neck: No JVD, no carotid bruits  Cardiovascular: Regular rate and rhythm, no murmurs  appreciated Pulmonary/Chest: Clear to auscultation bilaterally, no wheezes or rails Abdominal: Soft.  no distension.  no tenderness.  Musculoskeletal: Normal range of motion Neurological:  normal muscle tone. Coordination normal. No atrophy Skin: Skin warm and dry Psychiatric: normal affect, pleasant   Recent Labs: 07/27/2023: ALT 52; BUN 12; Creat 0.85; Hemoglobin  9.0; Platelets 212; Potassium 4.5; Sodium 135; TSH 1.91    Lipid Panel Lab Results  Component Value Date   CHOL 94 07/27/2023   HDL 31 (L) 07/27/2023   LDLCALC 49 07/27/2023   TRIG 59 07/27/2023    Wt Readings from Last 3 Encounters:  08/15/23 (!) 319 lb 6 oz (144.9 kg)  08/03/23 (!) 317 lb (143.8 kg)  05/13/23 (!) 305 lb 6.4 oz (138.5 kg)     ASSESSMENT AND PLAN:  Coronary artery disease of bypass graft of native heart with stable angina pectoris (HCC) -  Recent episode atypical chest pain, seen in the ER, workup negative Denies further episodes, feels he is at his baseline Thinks the blood pressure spike was in the setting of being upset Blood pressure today well-controlled, no further workup needed Discussed with him that he may have more shortness of breath in the setting of anemia, hemoglobin 9  Anemia Hemoglobin 9 down from 14 over the past year or 2 Scheduled to see hematology Reports he does Cologuard, no recent colonoscopy He has restarted on iron pill over-the-counter  Mixed hyperlipidemia - Plan: EKG 12-Lead Cholesterol is at goal on the current lipid regimen. No changes to the medications were made.  Essential hypertension - Plan: EKG 12-Lead Blood pressure is well controlled on today's visit. No changes made to the medications.  Type 2 diabetes mellitus without complication, with long-term current use of insulin  (HCC) - Plan: EKG 12-Lead  hemoglobin A 1C in the 7 range Recommended low carbohydrate diet Ideally should be on Ozempic/Mounjaro, this was discussed with him He will think about it  and talk with primary care  Morbid obesity (HCC) - Plan: EKG 12-Lead  Recommended low carbohydrate diet, suggested Ozempic/Mounjaro for weight loss  Erectile dysfunction Managed by PMD    No orders of the defined types were placed in this encounter.    Signed, Juanda Noon, M.D., Ph.D. 08/15/2023  [facility] [place], [place] [phone]

## 2023-08-15 ENCOUNTER — Ambulatory Visit: Payer: 59 | Attending: Cardiovascular Disease | Admitting: Cardiovascular Disease

## 2023-08-15 ENCOUNTER — Encounter: Payer: Self-pay | Admitting: Cardiovascular Disease

## 2023-08-15 VITALS — BP 128/70 | HR 62 | Ht 68.0 in | Wt 319.4 lb

## 2023-08-15 DIAGNOSIS — I2510 Atherosclerotic heart disease of native coronary artery without angina pectoris: Secondary | ICD-10-CM | POA: Diagnosis not present

## 2023-08-15 DIAGNOSIS — E1151 Type 2 diabetes mellitus with diabetic peripheral angiopathy without gangrene: Secondary | ICD-10-CM | POA: Diagnosis not present

## 2023-08-15 DIAGNOSIS — E1159 Type 2 diabetes mellitus with other circulatory complications: Secondary | ICD-10-CM

## 2023-08-15 DIAGNOSIS — G4733 Obstructive sleep apnea (adult) (pediatric): Secondary | ICD-10-CM

## 2023-08-15 DIAGNOSIS — I10 Essential (primary) hypertension: Secondary | ICD-10-CM | POA: Diagnosis not present

## 2023-08-15 DIAGNOSIS — E785 Hyperlipidemia, unspecified: Secondary | ICD-10-CM | POA: Diagnosis not present

## 2023-08-15 DIAGNOSIS — Z951 Presence of aortocoronary bypass graft: Secondary | ICD-10-CM

## 2023-08-15 NOTE — Patient Instructions (Signed)

## 2023-08-16 ENCOUNTER — Other Ambulatory Visit: Payer: Self-pay | Admitting: Internal Medicine

## 2023-08-16 DIAGNOSIS — E1151 Type 2 diabetes mellitus with diabetic peripheral angiopathy without gangrene: Secondary | ICD-10-CM

## 2023-08-16 NOTE — Telephone Encounter (Signed)
Requested medications are due for refill today.  yes  Requested medications are on the active medications list.  yes  Last refill. 07/13/2023 #100 0 rf  Future visit scheduled.   yes  Notes to clinic.  No protocol.    Requested Prescriptions  Pending Prescriptions Disp Refills   ACCU-CHEK GUIDE TEST test strip [Pharmacy Med Name: Accu-Chek Guide In Vitro Strip] 100 each 0    Sig: USE TO CHECK BLOOD SUGAR UP TO 3 TIMES DAILY     There is no refill protocol information for this order

## 2023-08-22 ENCOUNTER — Inpatient Hospital Stay: Payer: 59

## 2023-08-22 ENCOUNTER — Inpatient Hospital Stay: Payer: 59 | Admitting: Oncology

## 2023-09-04 ENCOUNTER — Other Ambulatory Visit: Payer: Self-pay | Admitting: Family Medicine

## 2023-09-04 DIAGNOSIS — G8929 Other chronic pain: Secondary | ICD-10-CM

## 2023-09-06 NOTE — Telephone Encounter (Signed)
 Requested medication (s) are due for refill today: Yes  Requested medication (s) are on the active medication list: Yes  Last refill:  05/14/22  Future visit scheduled: Yes  Notes to clinic:  Prescription has expired.    Requested Prescriptions  Pending Prescriptions Disp Refills   baclofen (LIORESAL) 10 MG tablet [Pharmacy Med Name: Baclofen 10 MG Oral Tablet] 60 tablet 0    Sig: TAKE ONE-HALF TO ONE TABLET BY MOUTH TWICE DAILY AS NEEDED FOR MUSCLE SPASM (LEG PAIN)     Analgesics:  Muscle Relaxants - baclofen Passed - 09/06/2023  9:21 AM      Passed - Cr in normal range and within 180 days    Creat  Date Value Ref Range Status  07/27/2023 0.85 0.70 - 1.35 mg/dL Final   Creatinine, Urine  Date Value Ref Range Status  07/27/2023 115 20 - 320 mg/dL Final         Passed - eGFR is 30 or above and within 180 days    GFR, Est African American  Date Value Ref Range Status  07/15/2020 108 > OR = 60 mL/min/1.18m2 Final   GFR, Est Non African American  Date Value Ref Range Status  07/15/2020 93 > OR = 60 mL/min/1.19m2 Final   GFR, Estimated  Date Value Ref Range Status  04/10/2023 >60 >60 mL/min Final    Comment:    (NOTE) Calculated using the CKD-EPI Creatinine Equation (2021)    eGFR  Date Value Ref Range Status  07/27/2023 96 > OR = 60 mL/min/1.66m2 Final         Passed - Valid encounter within last 6 months    Recent Outpatient Visits           1 month ago Annual physical exam   [place] [facility] Smitty Cords, DO   3 months ago Essential hypertension   [place] [facility] Delles, Gentry Fitz A, RPH-CPP   5 months ago DM (diabetes mellitus), type 2 with peripheral vascular complications [facility])   [place] [facility] Delles, Gentry Fitz A, RPH-CPP   7 months ago DM (diabetes mellitus), type 2 with peripheral vascular complications [facility])   [place] [facility] Smitty Cords, DO   10 months ago Essential hypertension   [place] [facility] Delles, Jackelyn Poling, RPH-CPP       Future Appointments             In 4 months Althea Charon, Netta Neat, DO [place] [facility], [facility]

## 2023-09-26 ENCOUNTER — Other Ambulatory Visit (INDEPENDENT_AMBULATORY_CARE_PROVIDER_SITE_OTHER): Payer: Medicaid Other | Admitting: Pharmacist

## 2023-09-26 DIAGNOSIS — Z7984 Long term (current) use of oral hypoglycemic drugs: Secondary | ICD-10-CM

## 2023-09-26 DIAGNOSIS — Z794 Long term (current) use of insulin: Secondary | ICD-10-CM

## 2023-09-26 DIAGNOSIS — E1151 Type 2 diabetes mellitus with diabetic peripheral angiopathy without gangrene: Secondary | ICD-10-CM

## 2023-09-26 DIAGNOSIS — I10 Essential (primary) hypertension: Secondary | ICD-10-CM

## 2023-09-26 DIAGNOSIS — E119 Type 2 diabetes mellitus without complications: Secondary | ICD-10-CM

## 2023-09-26 NOTE — Progress Notes (Addendum)
 09/26/2023 Name: Craig Harvey MRN: [id number] DOB: 12/08/1955  Chief Complaint  Patient presents with   Medication Management    Craig Harvey is a 68 y.o. year old male who presented for a telephone visit.   They were referred to the pharmacist by their PCP for assistance in managing diabetes and hypertension.    Subjective:  Care Team: Primary Care Provider: Smitty Cords, DO; Next Scheduled Visit: 02/01/2024 Cardiologist: [facility] [place]  Medication Access/Adherence  Current Pharmacy:  [facility] [address]), [place] - [address] [place]) [place] [address] Phone: [phone] Fax: [phone]   Patient reports affordability concerns with their medications: No  Patient reports access/transportation concerns to their pharmacy: No  Patient reports adherence concerns with their medications:  No     Uses weekly pillbox; denies missed doses    Hypertension:   Patient followed by [facility]    Current medications:  - amlodipine 5 mg daily - metoprolol ER 25 mg daily - valsartan 320 mg daily - isosorbide ER 60 mg daily   Previous therapies tried: lisinopril, HCTZ (discontinued 12/2021 related to hyponatremia); amlodipine   Reports uses weekly pillbox to organize his medications; denies missed doses   Home Monitoring: Patient has an automated upper arm home BP machine Counsel on BP monitoring technique Current blood pressure readings:  - This morning: 147/77, HR 65, but denies resting prior to reading - Last night: 136/84, HR 73   Per patient and provider, patient with history of labile blood pressure    Current physical activity: limited by back pain, but walking ~30 minutes x 4 days/week   OSA/CPAP: Reports using CPAP   Limiting salt/sodium in his diet. Reviews nutrition labels for sodium content     Diabetes:   Current medications:  - Humalog Mix 75/25 - Injects 17  units twice daily with meals - metformin 500 mg twice daily   Current glucose readings: last checked this morning, reading: 132; recently ranging 100-132 *Attributes elevated reading this morning to [employer] cups last night   Denies recent symptoms of hypoglycemia  - Confirms has glucose tablets   Current physical activity: limited by back pain, but walking ~30 minutes x 4 days/week     Hyperlipidemia/ASCVD Risk Reduction   Current lipid lowering medications: atorvastatin 80 mg; ezetimibe 10 mg daily Medications tried in the past:    Antiplatelet regimen: aspirin 81 mg; clopidogrel 75 mg daily   ASCVD History: CAD s/p CABG x 05 January 2015 and PCI with balloon angioplasty and DES proximal RCA June 2023  Risk Factors: hypertension, hyperlipidemia, obesity, OSA on CPAP, T2DM    Current physical activity: limited by back pain, but walking ~30 minutes x 4 days/week     Objective:  Lab Results  Component Value Date   HGBA1C 6.6 (H) 07/27/2023    Lab Results  Component Value Date   CREATININE 0.85 07/27/2023   BUN 12 07/27/2023   NA 135 07/27/2023   K 4.5 07/27/2023   CL 100 07/27/2023   CO2 27 07/27/2023    Lab Results  Component Value Date   CHOL 94 07/27/2023   HDL 31 (L) 07/27/2023   LDLCALC 49 07/27/2023   TRIG 59 07/27/2023   CHOLHDL 3.0 07/27/2023   BP Readings from Last 3 Encounters:  08/15/23 128/70  08/03/23 110/70  05/13/23 115/68   Pulse Readings from Last 3 Encounters:  08/15/23 62  08/03/23 76  05/13/23 67  Medications Reviewed Today     Reviewed by Manuela Neptune, RPH-CPP (Pharmacist) on 09/26/23 at 1414  Med List Status: <None>   Medication Order Taking? Sig Documenting Provider Last Dose Status Informant  Accu-Chek Softclix Lancets lancets [id number]  Use to check blood sugar up to 3 x daily Smitty Cords, DO  Active   amLODipine (NORVASC) 5 MG tablet [id number] Yes Take 1 tablet (5 mg total) by mouth daily. Smitty Cords, DO Taking Active   aspirin EC 81 MG tablet [id number] Yes Take 1 tablet (81 mg total) by mouth daily. Antonieta Iba, MD Taking Active Pharmacy Records, Self  atorvastatin (LIPITOR) 80 MG tablet [id number] Yes Take 1 tablet (80 mg total) by mouth daily. Smitty Cords, DO Taking Active   baclofen (LIORESAL) 10 MG tablet [id number]  TAKE ONE-HALF TO ONE TABLET BY MOUTH TWICE DAILY AS NEEDED FOR MUSCLE SPASM (LEG PAIN) Smitty Cords, DO  Active   Blood Glucose Monitoring Suppl (ACCU-CHEK GUIDE ME) w/Device KIT [id number]  Use to check blood sugar up to 3 x daily Smitty Cords, DO  Active   clopidogrel (PLAVIX) 75 MG tablet [id number] Yes Take 1 tablet (75 mg total) by mouth daily. Charlsie Quest, NP Taking Active   docusate sodium (COLACE) 100 MG capsule [id number]  Take 100 mg by mouth 2 (two) times daily. [provider]  Active   ezetimibe (ZETIA) 10 MG tablet [id number] Yes Take 1 tablet (10 mg total) by mouth daily. Smitty Cords, DO Taking Active   glucose blood (ACCU-CHEK GUIDE TEST) test strip [id number]  USE TO CHECK BLOOD SUGAR UP TO 3 TIMES DAILY Karamalegos, Netta Neat, DO  Active   insulin lispro protamine-lispro (HUMALOG MIX 75/25) (75-25) 100 UNIT/ML SUSP injection [id number] Yes INJECT 17 UNITS UNDER THE SKIN TWICE DAILY WITH A MEAL *MAX OF 50 UNITS DAILY* Karamalegos, Netta Neat, DO Taking Active   Insulin Syringe-Needle U-100 (BD VEO INSULIN SYRINGE U/F) 31G X 15/64" 0.3 ML MISC [id number]  USE AS DIRECTED TWICE DAILY Althea Charon, Netta Neat, DO  Active   isosorbide mononitrate (IMDUR) 60 MG 24 hr tablet [id number] Yes Take 1 tablet by mouth once daily Smitty Cords, DO Taking Active   metFORMIN (GLUCOPHAGE) 500 MG tablet [id number] Yes Take 1 tablet (500 mg total) by mouth 2 (two) times daily with a meal. Althea Charon, Netta Neat, DO Taking Active   metoprolol succinate (TOPROL-XL) 25 MG 24 hr tablet [id number] Yes  Take 1 tablet (25 mg total) by mouth daily. Smitty Cords, DO Taking Active   nitroGLYCERIN (NITROSTAT) 0.4 MG SL tablet [id number]  Place 1 tablet (0.4 mg total) under the tongue every 5 (five) minutes as needed for chest pain. Antonieta Iba, MD  Active   pantoprazole (PROTONIX) 40 MG tablet [id number] Yes Take 1 tablet (40 mg total) by mouth daily before breakfast. Smitty Cords, DO Taking Active   sertraline (ZOLOFT) 50 MG tablet [id number] Yes Take 1 tablet (50 mg total) by mouth daily. Smitty Cords, DO Taking Active   Syringe, Disposable, 3 ML MISC [id number]  Use to inject humalog mix 75/25 17u BID Smitty Cords, DO  Active Pharmacy Records, Self  valsartan (DIOVAN) 320 MG tablet [id number] Yes Take 1 tablet (320 mg total) by mouth daily. Smitty Cords, DO Taking Active               Assessment/Plan:   Diabetes: - Reviewed long term cardiovascular and renal outcomes of  uncontrolled blood sugar - Reviewed goal A1c, goal fasting, and goal 2 hour post prandial glucose - Reviewed dietary modifications including having regular, well-balanced meals, while controlling carbohydrate portion sizes - Have counseled patient on s/s of low blood sugar and how to treat lows - Recommend patient to continue to monitor home blood sugar, keep log of the results and bring this record with him to medical appointments     Hypertension: - Encourage patient to use CPAP consistently - Reviewed long term cardiovascular and renal outcomes of uncontrolled blood pressure - Encourage patient to continue to take positional changes slowly - Recommended to continue to check home blood pressure and heart rate, keep log of the results and bring this record with him to medical appointments        Follow Up Plan: Clinical Pharmacist will follow up with patient by telephone on 03/26/2024 at 2:00 PM     Estelle Grumbles, PharmD, Patsy [place], CPP Clinical  Pharmacist [facility] [phone]

## 2023-09-26 NOTE — Patient Instructions (Signed)
Goals Addressed             This Visit's Progress    Pharmacy Goals       Our goal A1c is less than 7%. This corresponds with fasting sugars less than 130 and 2 hour after meal sugars less than 180. Please keep a log of your results when checking your blood sugar   Our goal bad cholesterol, or LDL, is less than 70 . This is why it is important to continue taking your atorvastatin and ezetimibe.  Check your blood pressure daily, and any time you have concerning symptoms like headache, chest pain, dizziness, shortness of breath, or vision changes.   Our goal is less than 130/80.  To appropriately check your blood pressure, make sure you do the following:  1) Avoid caffeine, exercise, or tobacco products for 30 minutes before checking. Empty your bladder. 2) Sit with your back supported in a flat-backed chair. Rest your arm on something flat (arm of the chair, table, etc). 3) Sit still with your feet flat on the floor, resting, for at least 5 minutes.  4) Check your blood pressure. Take 1-2 readings.  5) Write down these readings and bring with you to any provider appointments.  Bring your home blood pressure machine with you to a provider's office for accuracy comparison at least once a year.   Make sure you take your blood pressure medications before you come to any office visit, even if you were asked to fast for labs.   Atreus Hasz Delles, PharmD, BCACP, CPP Clinical Pharmacist [facility] [place] [phone]         

## 2023-10-11 ENCOUNTER — Other Ambulatory Visit: Payer: Self-pay | Admitting: Family Medicine

## 2023-10-11 DIAGNOSIS — I2510 Atherosclerotic heart disease of native coronary artery without angina pectoris: Secondary | ICD-10-CM

## 2023-10-11 DIAGNOSIS — I10 Essential (primary) hypertension: Secondary | ICD-10-CM

## 2023-10-12 NOTE — Telephone Encounter (Signed)
 Requested by interface surescripts. Last OV 08/03/23. Future visit in 3 months.  Requested Prescriptions  Pending Prescriptions Disp Refills   isosorbide mononitrate (IMDUR) 60 MG 24 hr tablet [Pharmacy Med Name: Isosorbide Mononitrate ER 60 MG Oral Tablet Extended Release 24 Hour] 90 tablet 0    Sig: Take 1 tablet by mouth once daily     Cardiovascular:  Nitrates Failed - 10/12/2023 11:49 AM      Failed - Valid encounter within last 12 months    Recent Outpatient Visits   None     Future Appointments             In 3 months Althea Charon, Netta Neat, DO [place] [facility]            Passed - Last BP in normal range    BP Readings from Last 1 Encounters:  08/15/23 128/70         Passed - Last Heart Rate in normal range    Pulse Readings from Last 1 Encounters:  08/15/23 62

## 2023-11-02 IMAGING — CR DG CHEST 2V
2 series · 2 of 2 positions shown · non-contrast
Comparison: Chest x-ray 02/04/2015

CLINICAL DATA: chest tightness

EXAM:
CHEST - 2 VIEW

[chest pa]
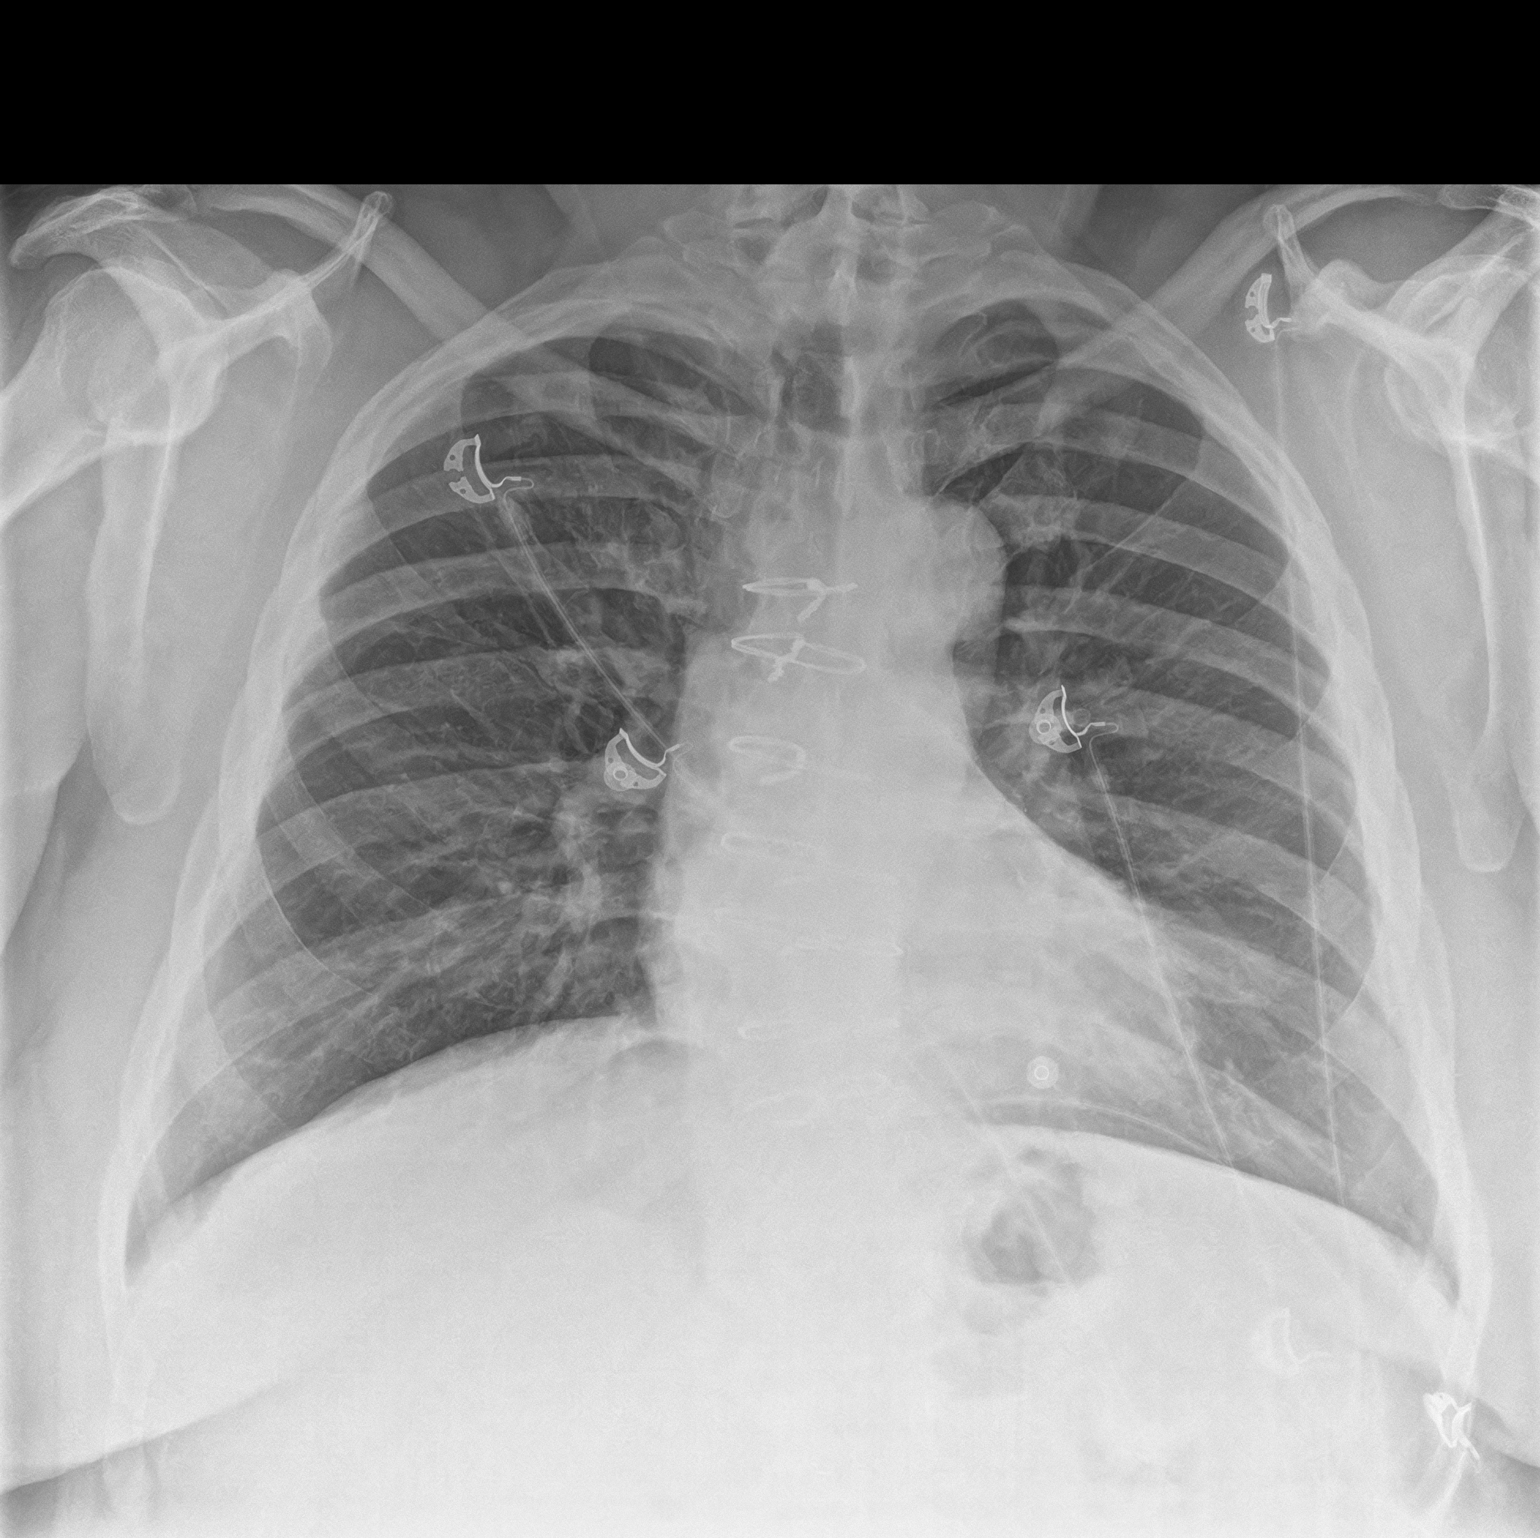

[chest lat]
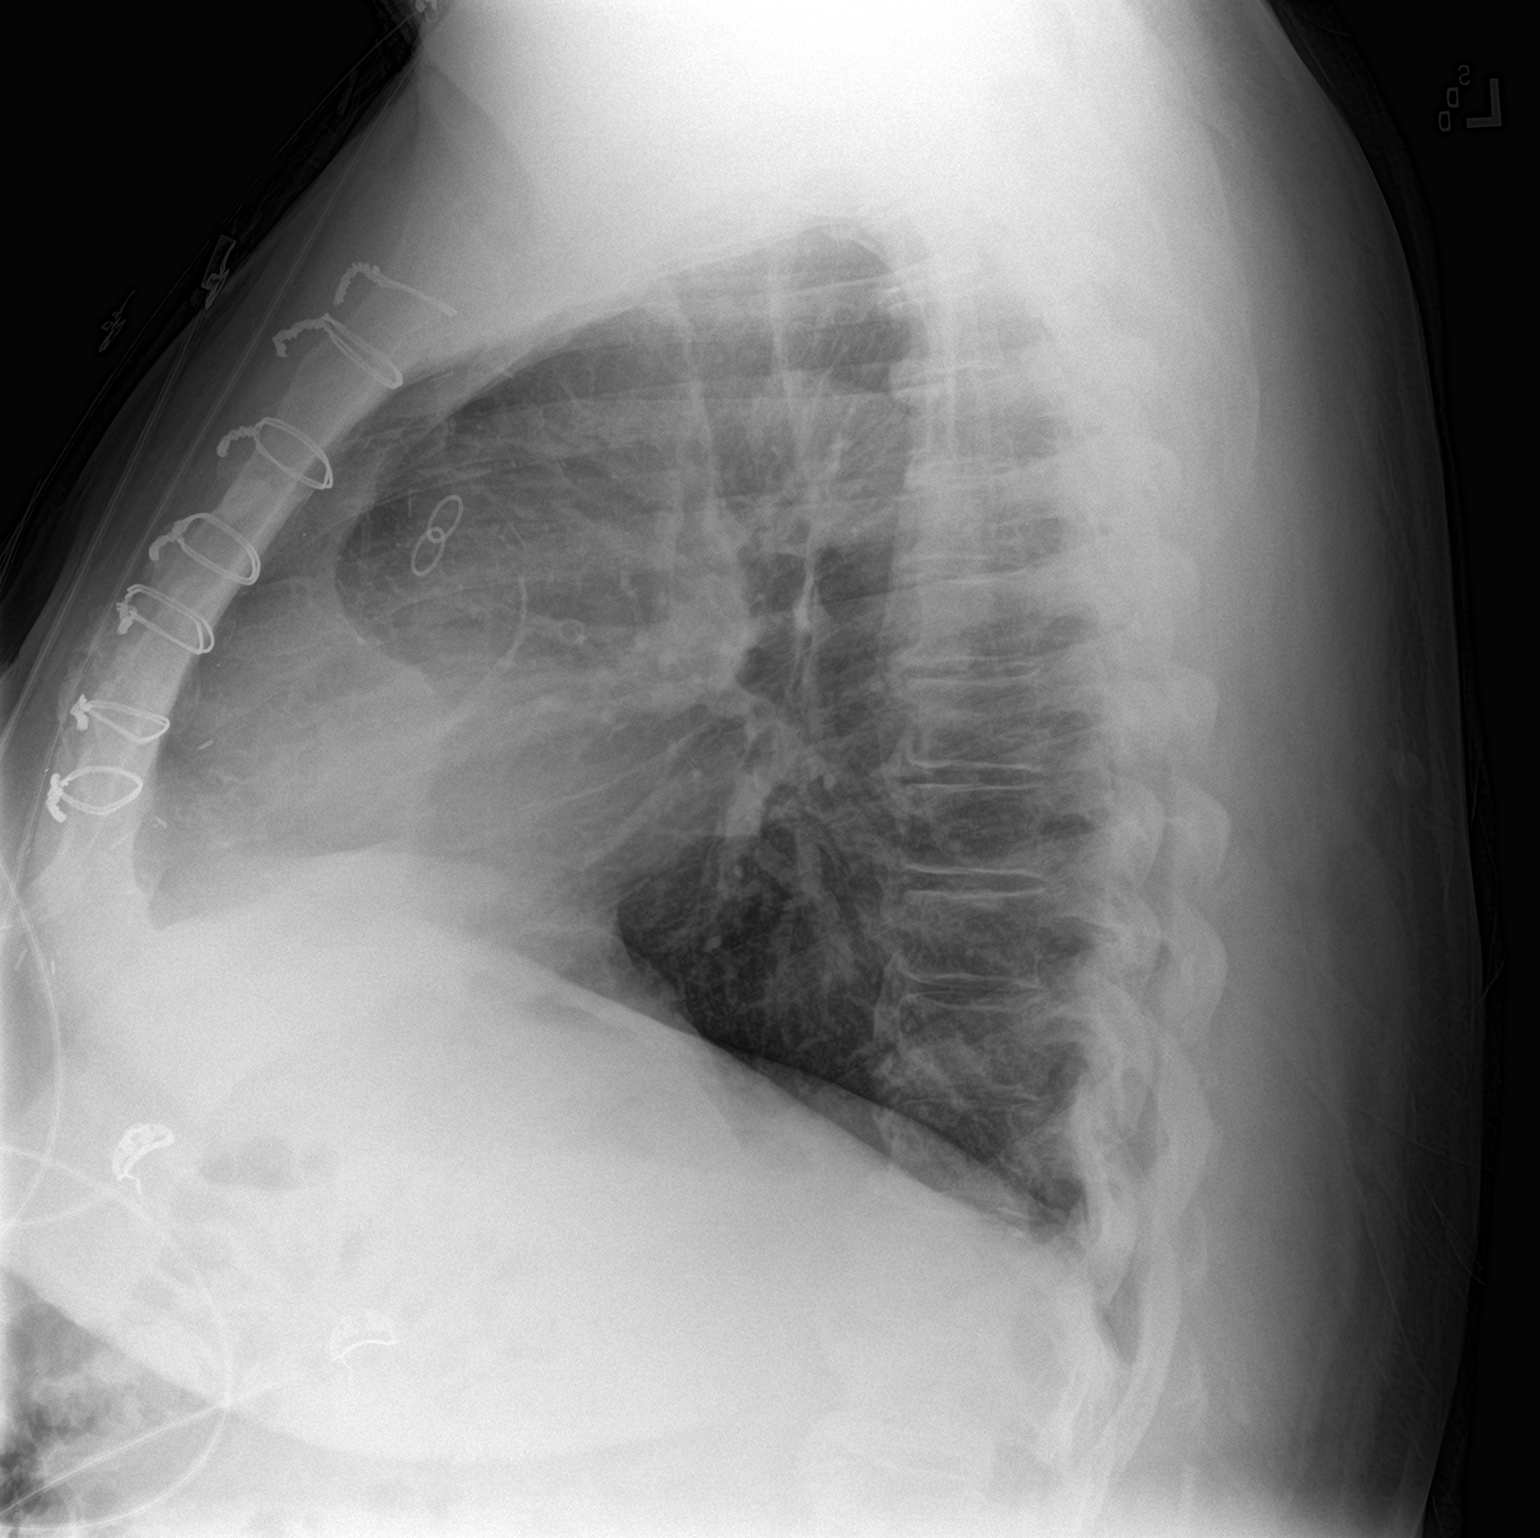

[2 of 2 positions shown; findings below may reference images not displayed]

FINDINGS: The heart and mediastinal contours are within normal limits.

No focal consolidation. Slightly increased interstitial markings. No
pleural effusion. No pneumothorax.

No acute osseous abnormality.  Intact sternotomy wires.
IMPRESSION: Slightly increased interstitial markings which may represent viral
illness versus small airway disease.

## 2023-11-30 ENCOUNTER — Other Ambulatory Visit: Payer: Self-pay | Admitting: Family Medicine

## 2023-11-30 DIAGNOSIS — G8929 Other chronic pain: Secondary | ICD-10-CM

## 2023-12-02 NOTE — Telephone Encounter (Signed)
 Requested Prescriptions  Pending Prescriptions Disp Refills   baclofen  (LIORESAL ) 10 MG tablet [Pharmacy Med Name: Baclofen  10 MG Oral Tablet] 60 tablet 0    Sig: TAKE 1/2 TO 1 (ONE-HALF TO ONE) TABLET BY MOUTH TWICE DAILY AS NEEDED FOR MUSCLE SPASM (LEG  PAIN)     Analgesics:  Muscle Relaxants - baclofen  Failed - 12/02/2023 11:26 AM      Failed - Valid encounter within last 6 months    Recent Outpatient Visits   None     Future Appointments             In 2 months Karamalegos, Kayleen Party, DO [facility], [facility]            Passed - Cr in normal range and within 180 days    Creat  Date Value Ref Range Status  07/27/2023 0.85 0.70 - 1.35 mg/dL Final   Creatinine, Urine  Date Value Ref Range Status  07/27/2023 115 20 - 320 mg/dL Final         Passed - eGFR is 30 or above and within 180 days    GFR, Est African American  Date Value Ref Range Status  07/15/2020 108 > OR = 60 mL/min/1.60m2 Final   GFR, Est Non African American  Date Value Ref Range Status  07/15/2020 93 > OR = 60 mL/min/1.78m2 Final   GFR, Estimated  Date Value Ref Range Status  04/10/2023 >60 >60 mL/min Final    Comment:    (NOTE) Calculated using the CKD-EPI Creatinine Equation (2021)    eGFR  Date Value Ref Range Status  07/27/2023 96 > OR = 60 mL/min/1.82m2 Final

## 2023-12-11 ENCOUNTER — Other Ambulatory Visit: Payer: Self-pay | Admitting: Family Medicine

## 2023-12-11 DIAGNOSIS — E1169 Type 2 diabetes mellitus with other specified complication: Secondary | ICD-10-CM

## 2023-12-13 NOTE — Telephone Encounter (Signed)
 Requested Prescriptions  Pending Prescriptions Disp Refills   atorvastatin  (LIPITOR ) 80 MG tablet [Pharmacy Med Name: Atorvastatin  Calcium  80 MG Oral Tablet] 90 tablet 1    Sig: Take 1 tablet by mouth once daily     Cardiovascular:  Antilipid - Statins Failed - 12/13/2023  9:29 AM      Failed - Valid encounter within last 12 months    Recent Outpatient Visits   None     Future Appointments             In 1 month Karamalegos, Kayleen Party, DO [place] [facility], [facility]            Failed - Lipid Panel in normal range within the last 12 months    Cholesterol, Total  Date Value Ref Range Status  06/09/2016 158 100 - 199 mg/dL Final   Cholesterol  Date Value Ref Range Status  07/27/2023 94 <200 mg/dL Final   LDL Cholesterol (Calc)  Date Value Ref Range Status  07/27/2023 49 mg/dL (calc) Final    Comment:    Reference range: <100 . Desirable range <100 mg/dL for primary prevention;   <70 mg/dL for patients with CHD or diabetic patients  with > or = 2 CHD risk factors. Aaron Aas LDL-C is now calculated using the Martin-Hopkins  calculation, which is a validated novel method providing  better accuracy than the Friedewald equation in the  estimation of LDL-C.  Melinda Sprawls et al. Erroll Heard. 2956;213(08): 2061-2068  (http://education.[employer].com/faq/FAQ164)    HDL  Date Value Ref Range Status  07/27/2023 31 (L) > OR = 40 mg/dL Final  65/78/4696 46 >29 mg/dL Final   Triglycerides  Date Value Ref Range Status  07/27/2023 59 <150 mg/dL Final         Passed - Patient is not pregnant

## 2023-12-23 ENCOUNTER — Other Ambulatory Visit: Payer: Self-pay | Admitting: Family Medicine

## 2023-12-23 DIAGNOSIS — E1151 Type 2 diabetes mellitus with diabetic peripheral angiopathy without gangrene: Secondary | ICD-10-CM

## 2023-12-26 NOTE — Telephone Encounter (Signed)
 Requested Prescriptions  Pending Prescriptions Disp Refills   insulin  lispro protamine -lispro (HUMALOG  MIX 75/25) (75-25) 100 UNIT/ML SUSP injection [Pharmacy Med Name: HumaLOG  Mix 75/25 (75-25) 100 UNIT/ML Subcutaneous Suspension] 30 mL 0    Sig: INJECT 17 UNITS SUBCUTANEOUSLY TWICE DAILY WITH A MEAL *MAX OF 50 UNITS DAILY*     Endocrinology:  Diabetes - Insulins Failed - 12/26/2023  4:53 PM      Failed - Valid encounter within last 6 months    Recent Outpatient Visits   None     Future Appointments             In 1 month Karamalegos, Marsa PARAS, DO [facility], [facility]            Passed - HBA1C is between 0 and 7.9 and within 180 days    Hemoglobin A1C  Date Value Ref Range Status  04/18/2017 6.4  Final   Hgb A1c MFr Bld  Date Value Ref Range Status  07/27/2023 6.6 (H) <5.7 % of total Hgb Final    Comment:    For someone without known diabetes, a hemoglobin A1c value of 6.5% or greater indicates that they may have  diabetes and this should be confirmed with a follow-up  test. . For someone with known diabetes, a value <7% indicates  that their diabetes is well controlled and a value  greater than or equal to 7% indicates suboptimal  control. A1c targets should be individualized based on  duration of diabetes, age, comorbid conditions, and  other considerations. . Currently, no consensus exists regarding use of hemoglobin A1c for diagnosis of diabetes for children. SABRA

## 2023-12-30 ENCOUNTER — Other Ambulatory Visit: Payer: Self-pay | Admitting: Family Medicine

## 2023-12-30 DIAGNOSIS — G8929 Other chronic pain: Secondary | ICD-10-CM

## 2024-01-02 NOTE — Telephone Encounter (Signed)
 Requested Prescriptions  Pending Prescriptions Disp Refills   baclofen  (LIORESAL ) 10 MG tablet [Pharmacy Med Name: Baclofen  10 MG Oral Tablet] 180 tablet 0    Sig: TAKE 1/2 TO 1 (ONE-HALF TO ONE) TABLET BY MOUTH TWICE DAILY AS NEEDED FOR MUSCLE SPASM (LEG  PAIN)     Analgesics:  Muscle Relaxants - baclofen  Failed - 01/02/2024  8:51 AM      Failed - Valid encounter within last 6 months    Recent Outpatient Visits   None     Future Appointments             In 1 month Karamalegos, Marsa PARAS, DO [place] [facility], [facility]            Passed - Cr in normal range and within 180 days    Creat  Date Value Ref Range Status  07/27/2023 0.85 0.70 - 1.35 mg/dL Final   Creatinine, Urine  Date Value Ref Range Status  07/27/2023 115 20 - 320 mg/dL Final         Passed - eGFR is 30 or above and within 180 days    GFR, Est African American  Date Value Ref Range Status  07/15/2020 108 > OR = 60 mL/min/1.50m2 Final   GFR, Est Non African American  Date Value Ref Range Status  07/15/2020 93 > OR = 60 mL/min/1.73m2 Final   GFR, Estimated  Date Value Ref Range Status  04/10/2023 >60 >60 mL/min Final    Comment:    (NOTE) Calculated using the CKD-EPI Creatinine Equation (2021)    eGFR  Date Value Ref Range Status  07/27/2023 96 > OR = 60 mL/min/1.72m2 Final

## 2024-01-05 ENCOUNTER — Other Ambulatory Visit: Payer: Self-pay | Admitting: Family Medicine

## 2024-01-05 DIAGNOSIS — E1151 Type 2 diabetes mellitus with diabetic peripheral angiopathy without gangrene: Secondary | ICD-10-CM

## 2024-01-09 ENCOUNTER — Other Ambulatory Visit: Payer: Self-pay | Admitting: Family Medicine

## 2024-01-09 DIAGNOSIS — I10 Essential (primary) hypertension: Secondary | ICD-10-CM

## 2024-01-09 DIAGNOSIS — I2510 Atherosclerotic heart disease of native coronary artery without angina pectoris: Secondary | ICD-10-CM

## 2024-01-10 NOTE — Telephone Encounter (Signed)
 Requested medications are due for refill today.  yes  Requested medications are on the active medications list.  yes  Last refill. 12/01/2022 #200 3 rf  Future visit scheduled.   yes  Notes to clinic.  Protocol would not attach. Please review for refill.    Requested Prescriptions  Pending Prescriptions Disp Refills   BD VEO INSULIN  SYR ULTRAFINE 31G X 15/64 0.3 ML MISC [Pharmacy Med Name: BD INS SYR 0.3/31G/6MM MIS] 100 each 0    Sig: USE AS DIRECTED TWICE DAILY     There is no refill protocol information for this order

## 2024-01-11 NOTE — Telephone Encounter (Signed)
 Last OV 07/14/23 within protocol.  Requested Prescriptions  Pending Prescriptions Disp Refills   isosorbide  mononitrate (IMDUR ) 60 MG 24 hr tablet [Pharmacy Med Name: Isosorbide  Mononitrate ER 60 MG Oral Tablet Extended Release 24 Hour] 90 tablet 0    Sig: Take 1 tablet by mouth once daily     Cardiovascular:  Nitrates Failed - 01/11/2024  8:34 AM      Failed - Valid encounter within last 12 months    Recent Outpatient Visits   None     Future Appointments             In 3 weeks Edman Marsa PARAS, DO [facility], [facility]            Passed - Last BP in normal range    BP Readings from Last 1 Encounters:  08/15/23 128/70         Passed - Last Heart Rate in normal range    Pulse Readings from Last 1 Encounters:  08/15/23 62

## 2024-02-01 ENCOUNTER — Ambulatory Visit (INDEPENDENT_AMBULATORY_CARE_PROVIDER_SITE_OTHER): Payer: 59 | Admitting: Family Medicine

## 2024-02-01 ENCOUNTER — Encounter: Payer: Self-pay | Admitting: Family Medicine

## 2024-02-01 VITALS — BP 122/64 | HR 64 | Ht 68.0 in | Wt 325.1 lb

## 2024-02-01 DIAGNOSIS — E1151 Type 2 diabetes mellitus with diabetic peripheral angiopathy without gangrene: Secondary | ICD-10-CM

## 2024-02-01 DIAGNOSIS — F325 Major depressive disorder, single episode, in full remission: Secondary | ICD-10-CM

## 2024-02-01 DIAGNOSIS — Z951 Presence of aortocoronary bypass graft: Secondary | ICD-10-CM | POA: Diagnosis not present

## 2024-02-01 DIAGNOSIS — G4733 Obstructive sleep apnea (adult) (pediatric): Secondary | ICD-10-CM | POA: Diagnosis not present

## 2024-02-01 DIAGNOSIS — K219 Gastro-esophageal reflux disease without esophagitis: Secondary | ICD-10-CM

## 2024-02-01 DIAGNOSIS — Z794 Long term (current) use of insulin: Secondary | ICD-10-CM | POA: Diagnosis not present

## 2024-02-01 DIAGNOSIS — I10 Essential (primary) hypertension: Secondary | ICD-10-CM | POA: Diagnosis not present

## 2024-02-01 LAB — POCT GLYCOSYLATED HEMOGLOBIN (HGB A1C): Hemoglobin A1C: 7.5 % — AB (ref 4.0–5.6)

## 2024-02-01 MED ORDER — PANTOPRAZOLE SODIUM 40 MG PO TBEC
40.0000 mg | DELAYED_RELEASE_TABLET | Freq: Every day | ORAL | 3 refills | Status: AC
Start: 2024-02-01 — End: ?

## 2024-02-01 MED ORDER — SERTRALINE HCL 50 MG PO TABS: 50.0000 mg | ORAL_TABLET | Freq: Every day | ORAL | 3 refills | Status: AC

## 2024-02-01 NOTE — Patient Instructions (Addendum)
 Thank you for coming to the office today.  Recent Labs    02/01/23 1551 07/27/23 0913 02/01/24 1348  HGBA1C 6.0* 6.6* 7.5*    Reduce snacks and sweets  Check with the [facility] pharmacy about Prevnar-20 or pneumonia vaccine.  If you need it, they can do it or we can do it next time.  We will get a copy of Eye Exam  DUE for FASTING BLOOD WORK (no food or drink after midnight before the lab appointment, only water or coffee without cream/sugar on the morning of)  SCHEDULE Lab Only visit in the morning at the clinic for lab draw in 6  MONTHS   - Make sure Lab Only appointment is at about 1 week before your next appointment, so that results will be available  For Lab Results, once available within 2-3 days of blood draw, you can can log in to MyChart online to view your results and a brief explanation. Also, we can discuss results at next follow-up visit.   Please schedule a Follow-up Appointment to: Return for 6 month fasting lab > 1 week later Annual Physical.  If you have any other questions or concerns, please feel free to call the office or send a message through MyChart. You may also schedule an earlier appointment if necessary.  Additionally, you may be receiving a survey about your experience at our office within a few days to 1 week by e-mail or mail. We value your feedback.  Marsa Officer, DO [facility], [place]

## 2024-02-01 NOTE — Progress Notes (Unsigned)
 Subjective:    Patient ID: Craig Harvey, male    DOB: 1955/12/13, 68 y.o.   MRN: [id number]  Craig Harvey is a 68 y.o. male presenting on 02/01/2024 for Medical Management of Chronic Issues   HPI  Elevated LFT Morbid Obesity BMI >48 Abnormal weight gain LFT mild elevated   Anemia, microcytic Hemoglobin 9.0 Up to date on colon CA Screening Denies dark stools or bleeding   CHRONIC DM, Type 2: A1c 7.5 today, elevated. Past history 6.0 to 6.6 Admits dietary intake increased contributing to high sugar. Failed Trulicity  due to side effect intolerance Stable CBGs Meds: Metformin  500mg  TWICE A DAY (half of 1000mg  tab), Humalog  Vials 75/25 - currently at 17u BID wc Reports good compliance. Tolerating well w/o side-effects Currently on ACEi Lifestyle: - Diet (trying to improve diet - eliminated french fries, chocolate) UTD DM eye - last done [facility] early 2025. Request copy No significant hypoglycemia   Depression, moderate, single episode, remission Improved control on SSRI Sertraline    Hypertension History of Unstable angina s/p PCI stent Syncopal episode CAD / Vascular Disease / S/p CABG (2016) Followed by Cardiology Medications current - Amlodipine  5mg  daily, Metoprolol  25mg  XL Daily, Valsartan  320mg  daily He admits some chronic chest wall discomfort after CABG. Suspects it to be permanent injury. BP readings controlled Prior ECHO  He has risk factors with DM2, HYPERTENSION, HLD, OSA on CPAP   OSA on CPAP Improved OSA control on CPAP, he already has supplies Using nightly. Improves his sleep   He experiences weakness in his back, particularly in the buttocks area, when walking and sometimes upon waking. He describes it as a 'tired muscle' feeling, not associated with pain, but with a sensation of weakness. He has gained weight recently and has started using a treadmill. No numbness, tingling, or burning in his feet. No dark stools or blood in his  stool. He feels weak when walking, which may be related to low iron levels.   Health Maintenance:   High dose Flu Shot today.    Recommend Prevnar-20 in future for pneumonia coverage age 65+    Health Maintenance: ***     02/01/2024    1:31 PM 08/03/2023    1:14 PM 02/01/2023    4:13 PM  Depression screen PHQ 2/9  Decreased Interest 0 0 0  Down, Depressed, Hopeless 0 0 0  PHQ - 2 Score 0 0 0  Altered sleeping 0  0  Tired, decreased energy 0  0  Change in appetite 0  0  Feeling bad or failure about yourself  0  1  Trouble concentrating 0  0  Moving slowly or fidgety/restless 0  0  Suicidal thoughts 0  0  PHQ-9 Score 0  1  Difficult doing work/chores Not difficult at all  Not difficult at all       02/01/2024    1:31 PM 08/03/2023    1:14 PM 02/01/2023    4:13 PM 11/02/2022    3:20 PM  GAD 7 : Generalized Anxiety Score  Nervous, Anxious, on Edge 0 0 0 0  Control/stop worrying 0 0 0 0  Worry too much - different things 0 0 1 0  Trouble relaxing 0 0 0 0  Restless 0 0 0 0  Easily annoyed or irritable 0 0 0 0  Afraid - awful might happen  0 1 0  Total GAD 7 Score  0 2 0  Anxiety Difficulty  Not difficult at all  Social History   Tobacco Use   Smoking status: Former    Current packs/day: 0.00    Types: Cigarettes    Quit date: 08/06/1975    Years since quitting: 48.5   Smokeless tobacco: Never  Vaping Use   Vaping status: Never Used  Substance Use Topics   Alcohol use: No   Drug use: No    Review of Systems Per HPI unless specifically indicated above     Objective:    BP 122/64 (BP Location: Right Arm, Patient Position: Sitting, Cuff Size: Large)   Pulse 64   Ht 5' 8 (1.727 m)   Wt (!) 325 lb 2 oz (147.5 kg)   SpO2 93%   BMI 49.44 kg/m   Wt Readings from Last 3 Encounters:  02/01/24 (!) 325 lb 2 oz (147.5 kg)  08/15/23 (!) 319 lb 6 oz (144.9 kg)  08/03/23 (!) 317 lb (143.8 kg)    Physical Exam    Results for orders placed or performed in  visit on 02/01/24  POCT HgB A1C   Collection Time: 02/01/24  1:48 PM  Result Value Ref Range   Hemoglobin A1C 7.5 (A) 4.0 - 5.6 %   HbA1c POC (<> result, manual entry)     HbA1c, POC (prediabetic range)     HbA1c, POC (controlled diabetic range)        Assessment & Plan:   Problem List Items Addressed This Visit     CAD s/p CABG x 3   DM (diabetes mellitus), type 2 with peripheral vascular complications (HCC) - Primary   Relevant Orders   POCT HgB A1C (Completed)   Essential hypertension   Gastroesophageal reflux disease without esophagitis   Relevant Medications   pantoprazole  (PROTONIX ) 40 MG tablet   Major depression in remission (HCC)   Relevant Medications   sertraline  (ZOLOFT ) 50 MG tablet   OSA on CPAP   Other Visit Diagnoses       Long term current use of insulin  (HCC)            ***  Orders Placed This Encounter  Procedures   POCT HgB A1C    Meds ordered this encounter  Medications   sertraline  (ZOLOFT ) 50 MG tablet    Sig: Take 1 tablet (50 mg total) by mouth daily.    Dispense:  90 tablet    Refill:  3    Add refills   pantoprazole  (PROTONIX ) 40 MG tablet    Sig: Take 1 tablet (40 mg total) by mouth daily before breakfast.    Dispense:  90 tablet    Refill:  3    Add refills    Follow up plan: Return for 6 month fasting lab > 1 week later Annual Physical.  Future labs ordered for ***  Marsa Officer, DO [facility], [facility] 02/01/2024, 1:58 PM

## 2024-02-10 ENCOUNTER — Other Ambulatory Visit: Payer: Self-pay | Admitting: Cardiology

## 2024-02-10 DIAGNOSIS — I2510 Atherosclerotic heart disease of native coronary artery without angina pectoris: Secondary | ICD-10-CM

## 2024-02-16 ENCOUNTER — Other Ambulatory Visit: Payer: Self-pay | Admitting: Internal Medicine

## 2024-02-16 DIAGNOSIS — E1151 Type 2 diabetes mellitus with diabetic peripheral angiopathy without gangrene: Secondary | ICD-10-CM

## 2024-02-20 NOTE — Telephone Encounter (Signed)
 Requested medication (s) are due for refill today: routing for review  Requested medication (s) are on the active medication list: yes  Last refill:  08/17/23  Future visit scheduled: {Yes  Notes to clinic:  unable to attach protocol, routing for review.     Requested Prescriptions  Pending Prescriptions Disp Refills   ACCU-CHEK GUIDE TEST test strip [Pharmacy Med Name: Accu-Chek Guide In Vitro Strip] 100 each 0    Sig: USE TO CHECK BLOOD SUGAR UP TO 3 TIMES DAILY     There is no refill protocol information for this order

## 2024-02-22 ENCOUNTER — Other Ambulatory Visit: Payer: Self-pay | Admitting: Family Medicine

## 2024-02-22 DIAGNOSIS — E1151 Type 2 diabetes mellitus with diabetic peripheral angiopathy without gangrene: Secondary | ICD-10-CM

## 2024-02-23 NOTE — Telephone Encounter (Signed)
 Requested Prescriptions  Pending Prescriptions Disp Refills   insulin  lispro protamine -lispro (HUMALOG  MIX 75/25) (75-25) 100 UNIT/ML SUSP injection [Pharmacy Med Name: HumaLOG  Mix 75/25 (75-25) 100 UNIT/ML Subcutaneous Suspension] 30 mL 0    Sig: INJECT 17 UNITS SUBCUTANEOUSLY TWICE DAILY WITH A MEALS. MAX OF 50 UNITS DAILY.     Endocrinology:  Diabetes - Insulins Passed - 02/23/2024  3:43 PM      Passed - HBA1C is between 0 and 7.9 and within 180 days    Hemoglobin A1C  Date Value Ref Range Status  02/01/2024 7.5 (A) 4.0 - 5.6 % Final  04/18/2017 6.4  Final   Hgb A1c MFr Bld  Date Value Ref Range Status  07/27/2023 6.6 (H) <5.7 % of total Hgb Final    Comment:    For someone without known diabetes, a hemoglobin A1c value of 6.5% or greater indicates that they may have  diabetes and this should be confirmed with a follow-up  test. . For someone with known diabetes, a value <7% indicates  that their diabetes is well controlled and a value  greater than or equal to 7% indicates suboptimal  control. A1c targets should be individualized based on  duration of diabetes, age, comorbid conditions, and  other considerations. . Currently, no consensus exists regarding use of hemoglobin A1c for diagnosis of diabetes for children. SABRA Amy - Valid encounter within last 6 months    Recent Outpatient Visits           3 weeks ago DM (diabetes mellitus), type 2 with peripheral vascular complications [facility])   [facility] [place], Marsa PARAS, [place]

## 2024-02-26 ENCOUNTER — Other Ambulatory Visit: Payer: Self-pay | Admitting: Family Medicine

## 2024-02-26 DIAGNOSIS — E1151 Type 2 diabetes mellitus with diabetic peripheral angiopathy without gangrene: Secondary | ICD-10-CM

## 2024-02-28 NOTE — Telephone Encounter (Signed)
 Requested medication (s) are due for refill today: no  Requested medication (s) are on the active medication list: yes  Last refill:  01/10/24 100 each  Future visit scheduled:  no  Notes to clinic:  no refill protocol   Requested Prescriptions  Pending Prescriptions Disp Refills   BD VEO INSULIN  SYR ULTRAFINE 31G X 15/64 0.3 ML MISC [Pharmacy Med Name: BD INS SYR 0.3/31G/6MM MIS] 100 each 0    Sig: USE AS DIRECTED TWICE DAILY     There is no refill protocol information for this order

## 2024-02-29 ENCOUNTER — Other Ambulatory Visit: Payer: Self-pay | Admitting: Family Medicine

## 2024-02-29 DIAGNOSIS — E1151 Type 2 diabetes mellitus with diabetic peripheral angiopathy without gangrene: Secondary | ICD-10-CM

## 2024-03-02 NOTE — Telephone Encounter (Signed)
 Requested Prescriptions  Refused Prescriptions Disp Refills   BD VEO INSULIN  SYR ULTRAFINE 31G X 15/64 0.3 ML MISC [Pharmacy Med Name: BD INS SYR 0.3/31G/6MM MIS] 100 each 0    Sig: USE AS DIRECTED TWICE DAILY     There is no refill protocol information for this order

## 2024-03-06 ENCOUNTER — Telehealth: Payer: Self-pay

## 2024-03-06 NOTE — Telephone Encounter (Signed)
 Chart updated

## 2024-03-06 NOTE — Telephone Encounter (Signed)
 Copied from CRM [id number]. Topic: Clinical - Medical Advice >> Mar 02, 2024  4:25 PM Tobias L wrote: Reason for CRM: Patient states he received information from [facility] in regards to the pneumonia vaccine. Patient states he was informed his last injection for the pneumonia vaccine was 01/18/2023.   Patient calling to advise Dr. Edman of his last injection date.

## 2024-03-26 ENCOUNTER — Other Ambulatory Visit: Admitting: Pharmacist

## 2024-03-26 DIAGNOSIS — Z794 Long term (current) use of insulin: Secondary | ICD-10-CM

## 2024-03-26 DIAGNOSIS — E1151 Type 2 diabetes mellitus with diabetic peripheral angiopathy without gangrene: Secondary | ICD-10-CM

## 2024-03-26 DIAGNOSIS — I10 Essential (primary) hypertension: Secondary | ICD-10-CM

## 2024-03-26 DIAGNOSIS — Z951 Presence of aortocoronary bypass graft: Secondary | ICD-10-CM

## 2024-03-26 NOTE — Patient Instructions (Signed)
Goals Addressed             This Visit's Progress    Pharmacy Goals       Our goal A1c is less than 7%. This corresponds with fasting sugars less than 130 and 2 hour after meal sugars less than 180. Please keep a log of your results when checking your blood sugar   Our goal bad cholesterol, or LDL, is less than 70 . This is why it is important to continue taking your atorvastatin and ezetimibe.  Check your blood pressure daily, and any time you have concerning symptoms like headache, chest pain, dizziness, shortness of breath, or vision changes.   Our goal is less than 130/80.  To appropriately check your blood pressure, make sure you do the following:  1) Avoid caffeine, exercise, or tobacco products for 30 minutes before checking. Empty your bladder. 2) Sit with your back supported in a flat-backed chair. Rest your arm on something flat (arm of the chair, table, etc). 3) Sit still with your feet flat on the floor, resting, for at least 5 minutes.  4) Check your blood pressure. Take 1-2 readings.  5) Write down these readings and bring with you to any provider appointments.  Bring your home blood pressure machine with you to a provider's office for accuracy comparison at least once a year.   Make sure you take your blood pressure medications before you come to any office visit, even if you were asked to fast for labs.   Atreus Hasz Delles, PharmD, BCACP, CPP Clinical Pharmacist [facility] [place] [phone]         

## 2024-03-26 NOTE — Progress Notes (Signed)
 03/26/2024 Name: Craig Harvey MRN: [id number] DOB: 08-03-1955  Chief Complaint  Patient presents with   Medication Management    Craig Harvey is a 68 y.o. year old male who presented for a telephone visit.   They were referred to the pharmacist by their PCP for assistance in managing diabetes and hypertension.      Subjective:   Care Team: Primary Care Provider: Edman Marsa PARAS, DO; Next Scheduled Visit: 08/09/2024 Cardiologist: [facility] [place]  Medication Access/Adherence  Current Pharmacy:  [facility] [address] ([address]), [place] - [address] [place] [address] Phone: [phone] Fax: [phone]   Patient reports affordability concerns with their medications: No  Patient reports access/transportation concerns to their pharmacy: No  Patient reports adherence concerns with their medications:  No     Uses weekly pillbox; denies missed doses   Has noticed improvement in back pain on days when gets enough rest. Has relief from Voltaren gel.    Hypertension:   Patient followed by [facility]    Current medications:  - amlodipine  5 mg daily - metoprolol  ER 25 mg daily - valsartan  320 mg daily - isosorbide  ER 60 mg daily   Previous therapies tried: lisinopril , HCTZ (discontinued 12/2021 related to hyponatremia); amlodipine     Home Monitoring: Patient has an automated upper arm home BP machine Counsel on BP monitoring technique Current blood pressure readings:  - Today: 148/75, HR 76 *Not rested well; after walking - 9/21: AM: 137/69, HR 80; PM: 123/67, HR 65 - 9/20: AM 116/62, HR 75; PM: 114/62, HR 69 - 9/19: AM: 140/77, HR 64; PM: 119/63, HR 65 - 9/18: AM: 133/66, HR 67; PM: 124/69, HR 74 - 9/17: AM: 149/76, HR 72; PM 125/62, HR 69 - 9/16: AM: 131/61, HR 70; PM 125/75, HR 68 - 9/15: AM: 135/77, HR 64; 133/65, HR 71   Per patient and provider, patient with history of labile  blood pressure    Current physical activity: limited by back pain, but walking ~5-30 minutes x most days/week   OSA/CPAP: Reports using CPAP consistently   Limiting salt/sodium in his diet. Reviews nutrition labels for sodium content     Diabetes:   Current medications:  - Humalog  Mix 75/25 - Injects 17 units twice daily with meals - metformin  500 mg twice daily  Previous therapies tried: Trulicity  (dizziness/headache with injections)   Current glucose readings: last checked this morning, reading: 130; recently ranging 99-130   Denies recent symptoms of hypoglycemia  - Confirms has glucose tablets   Current physical activity: limited by back pain, but walking ~5-30 minutes x most days/week  Reports has been working on weight loss; current weight at home ~315 lbs     Hyperlipidemia/ASCVD Risk Reduction   Current lipid lowering medications: atorvastatin  80 mg; ezetimibe  10 mg daily    Antiplatelet regimen: aspirin  81 mg; clopidogrel  75 mg daily   ASCVD History: CAD s/p CABG x 05 January 2015 and PCI with balloon angioplasty and DES proximal RCA June 2023  Risk Factors: hypertension, hyperlipidemia, obesity, OSA on CPAP, T2DM    Current physical activity: limited by back pain, but walking ~5-30 minutes x most days/week     Objective:  Lab Results  Component Value Date   HGBA1C 7.5 (A) 02/01/2024    Lab Results  Component Value Date   CREATININE 0.85 07/27/2023   BUN 12 07/27/2023   NA 135 07/27/2023   K 4.5 07/27/2023  CL 100 07/27/2023   CO2 27 07/27/2023    Lab Results  Component Value Date   CHOL 94 07/27/2023   HDL 31 (L) 07/27/2023   LDLCALC 49 07/27/2023   TRIG 59 07/27/2023   CHOLHDL 3.0 07/27/2023   BP Readings from Last 3 Encounters:  02/01/24 122/64  08/15/23 128/70  08/03/23 110/70   Pulse Readings from Last 3 Encounters:  02/01/24 64  08/15/23 62  08/03/23 76     Medications Reviewed Today     Reviewed by Alana Sharyle LABOR,  RPH-CPP (Pharmacist) on 03/26/24 at 1442  Med List Status: <None>   Medication Order Taking? Sig Documenting Provider Last Dose Status Informant  ACCU-CHEK GUIDE TEST test strip [id number]  USE TO CHECK BLOOD SUGAR UP TO 3 TIMES DAILY Edman Marsa PARAS, DO  Active   Accu-Chek Softclix Lancets lancets [id number]  Use to check blood sugar up to 3 x daily Edman Marsa PARAS, DO  Active   amLODipine  (NORVASC ) 5 MG tablet [id number] Yes Take 1 tablet (5 mg total) by mouth daily. Edman Marsa PARAS, DO  Active   aspirin  EC 81 MG tablet [id number] Yes Take 1 tablet (81 mg total) by mouth daily. Gollan, Timothy J, MD  Active Pharmacy Records, Self  atorvastatin  (LIPITOR ) 80 MG tablet [id number] Yes Take 1 tablet by mouth once daily Edman Marsa PARAS, DO  Active   baclofen  (LIORESAL ) 10 MG tablet [id number]  TAKE 1/2 TO 1 (ONE-HALF TO ONE) TABLET BY MOUTH TWICE DAILY AS NEEDED FOR MUSCLE SPASM (LEG  PAIN) Edman Marsa PARAS, DO  Active   BD VEO INSULIN  SYR ULTRAFINE 31G X 15/64 0.3 ML MISC [id number]  USE AS DIRECTED TWICE DAILY Edman Marsa PARAS, DO  Active   Blood Glucose Monitoring Suppl (ACCU-CHEK GUIDE ME) w/Device KIT [id number]  Use to check blood sugar up to 3 x daily Edman Marsa PARAS, DO  Active   clopidogrel  (PLAVIX ) 75 MG tablet [id number] Yes Take 1 tablet by mouth once daily Gollan, Timothy J, MD  Active   diclofenac Sodium (VOLTAREN) 1 % GEL [id number] Yes Apply 2 g topically 4 (four) times daily as needed. [provider]  Active   docusate sodium  (COLACE) 100 MG capsule [id number]  Take 100 mg by mouth 2 (two) times daily. [provider]  Active   ezetimibe  (ZETIA ) 10 MG tablet [id number] Yes Take 1 tablet (10 mg total) by mouth daily. Edman Marsa PARAS, DO  Active   insulin  lispro protamine -lispro (HUMALOG  MIX 75/25) (75-25) 100 UNIT/ML SUSP injection [id number] Yes INJECT 17 UNITS SUBCUTANEOUSLY TWICE DAILY WITH A MEALS. MAX OF 50  UNITS DAILY. Edman Marsa PARAS, DO  Active   isosorbide  mononitrate (IMDUR ) 60 MG 24 hr tablet [id number] Yes Take 1 tablet by mouth once daily Edman Marsa PARAS, DO  Active   metFORMIN  (GLUCOPHAGE ) 500 MG tablet [id number] Yes Take 1 tablet (500 mg total) by mouth 2 (two) times daily with a meal. Edman, Marsa PARAS, DO  Active   metoprolol  succinate (TOPROL -XL) 25 MG 24 hr tablet [id number] Yes Take 1 tablet (25 mg total) by mouth daily. Edman Marsa PARAS, DO  Active   nitroGLYCERIN  (NITROSTAT ) 0.4 MG SL tablet [id number]  Place 1 tablet (0.4 mg total) under the tongue every 5 (five) minutes as needed for chest pain. Gollan, Timothy J, MD  Active   pantoprazole  (PROTONIX ) 40 MG tablet [id number]  Take 1 tablet (40 mg total) by mouth daily before breakfast. Edman Marsa PARAS, DO  Active  sertraline  (ZOLOFT ) 50 MG tablet [id number]  Take 1 tablet (50 mg total) by mouth daily. Edman Marsa PARAS, DO  Active   Syringe, Disposable, 3 ML MISC [id number]  Use to inject humalog  mix 75/25 17u BID Edman Marsa PARAS, DO  Active Pharmacy Records, Self  valsartan  (DIOVAN ) 320 MG tablet [id number] Yes Take 1 tablet (320 mg total) by mouth daily. Edman Marsa PARAS, DO  Active               Assessment/Plan:    Diabetes: - Reviewed long term cardiovascular and renal outcomes of uncontrolled blood sugar - Reviewed goal A1c, goal fasting, and goal 2 hour post prandial glucose - Reviewed dietary modifications including having regular, well-balanced meals, while controlling carbohydrate portion sizes - Have counseled patient on s/s of low blood sugar and how to treat lows - Denies interest in continuous glucose monitoring at this time, but will let me know if interested in the future - Denies interest in GLP-1 Receptor Agonist therapy due to history with Trulicity  - Recommend patient to continue to monitor home blood sugar, keep log of the results and bring this  record with him to medical appointments - Will ask PCP to consider checking Vitamin B12 level with upcoming lab work     Hypertension: - Encourage patient to use CPAP consistently - Reviewed long term cardiovascular and renal outcomes of uncontrolled blood pressure - Encourage patient to continue to take positional changes slowly - Recommended to continue to check home blood pressure and heart rate, keep log of the results and bring this record with him to medical appointments      Follow Up Plan: Clinical Pharmacist will follow up with patient by telephone on 11/05/2024 at 2:00 PM      Sharyle Sia, PharmD, [place], CPP Clinical Pharmacist [facility] [phone]

## 2024-03-28 ENCOUNTER — Encounter: Payer: Self-pay | Admitting: *Deleted

## 2024-04-09 ENCOUNTER — Other Ambulatory Visit: Payer: Self-pay | Admitting: Family Medicine

## 2024-04-09 DIAGNOSIS — I10 Essential (primary) hypertension: Secondary | ICD-10-CM

## 2024-04-09 DIAGNOSIS — I2510 Atherosclerotic heart disease of native coronary artery without angina pectoris: Secondary | ICD-10-CM

## 2024-04-10 NOTE — Telephone Encounter (Signed)
 Requested Prescriptions  Pending Prescriptions Disp Refills   isosorbide  mononitrate (IMDUR ) 60 MG 24 hr tablet [Pharmacy Med Name: Isosorbide  Mononitrate ER 60 MG Oral Tablet Extended Release 24 Hour] 90 tablet 1    Sig: Take 1 tablet by mouth once daily     Cardiovascular:  Nitrates Passed - 04/10/2024  4:58 PM      Passed - Last BP in normal range    BP Readings from Last 1 Encounters:  02/01/24 122/64         Passed - Last Heart Rate in normal range    Pulse Readings from Last 1 Encounters:  02/01/24 64         Passed - Valid encounter within last 12 months    Recent Outpatient Visits           2 months ago DM (diabetes mellitus), type 2 with peripheral vascular complications [facility])   [facility] [place], Marsa PARAS, [place]

## 2024-04-11 ENCOUNTER — Other Ambulatory Visit: Payer: Self-pay | Admitting: Family Medicine

## 2024-04-11 DIAGNOSIS — I2510 Atherosclerotic heart disease of native coronary artery without angina pectoris: Secondary | ICD-10-CM

## 2024-04-11 DIAGNOSIS — I10 Essential (primary) hypertension: Secondary | ICD-10-CM

## 2024-04-13 NOTE — Telephone Encounter (Signed)
 Refilled 04/10/24 # 90 with 1 refill. Requested Prescriptions  Refused Prescriptions Disp Refills   isosorbide  mononitrate (IMDUR ) 60 MG 24 hr tablet [Pharmacy Med Name: Isosorbide  Mononitrate ER 60 MG Oral Tablet Extended Release 24 Hour] 90 tablet 0    Sig: Take 1 tablet by mouth once daily     Cardiovascular:  Nitrates Passed - 04/13/2024 10:44 AM      Passed - Last BP in normal range    BP Readings from Last 1 Encounters:  02/01/24 122/64         Passed - Last Heart Rate in normal range    Pulse Readings from Last 1 Encounters:  02/01/24 64         Passed - Valid encounter within last 12 months    Recent Outpatient Visits           2 months ago DM (diabetes mellitus), type 2 with peripheral vascular complications [facility])   [place] [facility] [place], Marsa PARAS, [place]

## 2024-04-16 ENCOUNTER — Other Ambulatory Visit: Payer: Self-pay | Admitting: Family Medicine

## 2024-04-16 DIAGNOSIS — E1151 Type 2 diabetes mellitus with diabetic peripheral angiopathy without gangrene: Secondary | ICD-10-CM

## 2024-04-17 ENCOUNTER — Other Ambulatory Visit: Payer: Self-pay | Admitting: Family Medicine

## 2024-04-17 DIAGNOSIS — E1151 Type 2 diabetes mellitus with diabetic peripheral angiopathy without gangrene: Secondary | ICD-10-CM

## 2024-04-19 NOTE — Telephone Encounter (Signed)
 Requested medication (s) are due for refill today - yes  Requested medication (s) are on the active medication list -yes  Future visit scheduled -yes  Last refill: 02/28/24 #100  Notes to clinic: no protocol attached  Requested Prescriptions  Pending Prescriptions Disp Refills   EMBECTA INSULIN  SYR ULTRAFINE 31G X 15/64 0.3 ML MISC [Pharmacy Med Name: INSULIN  SYRG 0.3/31G MIS] 100 each 0    Sig: USE AS DIRECTED TWICE DAILY     There is no refill protocol information for this order       Requested Prescriptions  Pending Prescriptions Disp Refills   EMBECTA INSULIN  SYR ULTRAFINE 31G X 15/64 0.3 ML MISC [Pharmacy Med Name: INSULIN  SYRG 0.3/31G MIS] 100 each 0    Sig: USE AS DIRECTED TWICE DAILY     There is no refill protocol information for this order

## 2024-04-19 NOTE — Telephone Encounter (Signed)
 Requested medication (s) are due for refill today: yes  Requested medication (s) are on the active medication list: yes  Last refill:  02/28/24  Future visit scheduled: yes  Notes to clinic:  unable to attach protocol, routing for review     Requested Prescriptions  Pending Prescriptions Disp Refills   BD VEO INSULIN  SYR ULTRAFINE 31G X 15/64 0.3 ML MISC [Pharmacy Med Name: BD INS SYR 0.3/31G/6MM MIS] 100 each 0    Sig: USE AS DIRECTED TWICE DAILY     There is no refill protocol information for this order

## 2024-04-23 NOTE — Progress Notes (Signed)
 JAYMIEN LANDIN                                          MRN: [id number]   04/23/2024   The [facility] Quality Team Specialist reviewed this patient medical record for the purposes of chart review for care gap closure. The following were reviewed: chart review for care gap closure-diabetic eye exam.    [facility] Quality Team

## 2024-04-26 ENCOUNTER — Other Ambulatory Visit: Payer: Self-pay | Admitting: Family Medicine

## 2024-04-26 DIAGNOSIS — E1151 Type 2 diabetes mellitus with diabetic peripheral angiopathy without gangrene: Secondary | ICD-10-CM

## 2024-04-27 NOTE — Telephone Encounter (Signed)
 Requested Prescriptions  Pending Prescriptions Disp Refills   insulin  lispro protamine -lispro (HUMALOG  MIX 75/25) (75-25) 100 UNIT/ML SUSP injection [Pharmacy Med Name: HumaLOG  Mix 75/25 (75-25) 100 UNIT/ML Subcutaneous Suspension] 30 mL 2    Sig: INJECT 17 UNITS SUBCUTANEOUSLY TWICE DAILY WITH A MEAL. MAX OF 50 UNITS DAILY.     Endocrinology:  Diabetes - Insulins Passed - 04/27/2024  3:05 PM      Passed - HBA1C is between 0 and 7.9 and within 180 days    Hemoglobin A1C  Date Value Ref Range Status  02/01/2024 7.5 (A) 4.0 - 5.6 % Final  04/18/2017 6.4  Final   Hgb A1c MFr Bld  Date Value Ref Range Status  07/27/2023 6.6 (H) <5.7 % of total Hgb Final    Comment:    For someone without known diabetes, a hemoglobin A1c value of 6.5% or greater indicates that they may have  diabetes and this should be confirmed with a follow-up  test. . For someone with known diabetes, a value <7% indicates  that their diabetes is well controlled and a value  greater than or equal to 7% indicates suboptimal  control. A1c targets should be individualized based on  duration of diabetes, age, comorbid conditions, and  other considerations. . Currently, no consensus exists regarding use of hemoglobin A1c for diagnosis of diabetes for children. SABRA Amy - Valid encounter within last 6 months    Recent Outpatient Visits           2 months ago DM (diabetes mellitus), type 2 with peripheral vascular complications [facility])   [place] [facility] [place], Marsa PARAS, [place]

## 2024-06-04 ENCOUNTER — Other Ambulatory Visit: Payer: Self-pay | Admitting: Family Medicine

## 2024-06-04 DIAGNOSIS — E1169 Type 2 diabetes mellitus with other specified complication: Secondary | ICD-10-CM

## 2024-06-07 NOTE — Telephone Encounter (Signed)
 Requested Prescriptions  Pending Prescriptions Disp Refills   atorvastatin  (LIPITOR ) 80 MG tablet [Pharmacy Med Name: Atorvastatin  Calcium  80 MG Oral Tablet] 90 tablet 0    Sig: Take 1 tablet by mouth once daily     Cardiovascular:  Antilipid - Statins Failed - 06/07/2024  3:20 PM      Failed - Lipid Panel in normal range within the last 12 months    Cholesterol, Total  Date Value Ref Range Status  06/09/2016 158 100 - 199 mg/dL Final   Cholesterol  Date Value Ref Range Status  07/27/2023 94 <200 mg/dL Final   LDL Cholesterol (Calc)  Date Value Ref Range Status  07/27/2023 49 mg/dL (calc) Final    Comment:    Reference range: <100 . Desirable range <100 mg/dL for primary prevention;   <70 mg/dL for patients with CHD or diabetic patients  with > or = 2 CHD risk factors. SABRA LDL-C is now calculated using the Martin-Hopkins  calculation, which is a validated novel method providing  better accuracy than the Friedewald equation in the  estimation of LDL-C.  Gladis APPLETHWAITE et al. SANDREA. 7986;689(80): 2061-2068  (http://education.[employer].com/faq/FAQ164)    HDL  Date Value Ref Range Status  07/27/2023 31 (L) > OR = 40 mg/dL Final  87/93/7982 46 >60 mg/dL Final   Triglycerides  Date Value Ref Range Status  07/27/2023 59 <150 mg/dL Final         Passed - Patient is not pregnant      Passed - Valid encounter within last 12 months    Recent Outpatient Visits           4 months ago DM (diabetes mellitus), type 2 with peripheral vascular complications [facility])   [place] [facility] [place], Marsa PARAS, [place]

## 2024-07-07 ENCOUNTER — Other Ambulatory Visit: Payer: Self-pay | Admitting: Family Medicine

## 2024-07-07 DIAGNOSIS — E1151 Type 2 diabetes mellitus with diabetic peripheral angiopathy without gangrene: Secondary | ICD-10-CM

## 2024-07-09 NOTE — Telephone Encounter (Signed)
 Requested Prescriptions  Pending Prescriptions Disp Refills   metFORMIN  (GLUCOPHAGE ) 500 MG tablet [Pharmacy Med Name: metFORMIN  HCl 500 MG Oral Tablet] 180 tablet 0    Sig: TAKE 1 TABLET BY MOUTH TWICE DAILY WITH A MEAL     Endocrinology:  Diabetes - Biguanides Failed - 07/09/2024  1:48 PM      Failed - B12 Level in normal range and within 720 days    No results found for: VITAMINB12       Passed - Cr in normal range and within 360 days    Creat  Date Value Ref Range Status  07/27/2023 0.85 0.70 - 1.35 mg/dL Final   Creatinine, Urine  Date Value Ref Range Status  07/27/2023 115 20 - 320 mg/dL Final         Passed - HBA1C is between 0 and 7.9 and within 180 days    Hemoglobin A1C  Date Value Ref Range Status  02/01/2024 7.5 (A) 4.0 - 5.6 % Final  04/18/2017 6.4  Final   Hgb A1c MFr Bld  Date Value Ref Range Status  07/27/2023 6.6 (H) <5.7 % of total Hgb Final    Comment:    For someone without known diabetes, a hemoglobin A1c value of 6.5% or greater indicates that they may have  diabetes and this should be confirmed with a follow-up  test. . For someone with known diabetes, a value <7% indicates  that their diabetes is well controlled and a value  greater than or equal to 7% indicates suboptimal  control. A1c targets should be individualized based on  duration of diabetes, age, comorbid conditions, and  other considerations. . Currently, no consensus exists regarding use of hemoglobin A1c for diagnosis of diabetes for children. .          Passed - eGFR in normal range and within 360 days    GFR, Est African American  Date Value Ref Range Status  07/15/2020 108 > OR = 60 mL/min/1.72m2 Final   GFR, Est Non African American  Date Value Ref Range Status  07/15/2020 93 > OR = 60 mL/min/1.82m2 Final   GFR, Estimated  Date Value Ref Range Status  04/10/2023 >60 >60 mL/min Final    Comment:    (NOTE) Calculated using the CKD-EPI Creatinine Equation (2021)     eGFR  Date Value Ref Range Status  07/27/2023 96 > OR = 60 mL/min/1.52m2 Final         Passed - Valid encounter within last 6 months    Recent Outpatient Visits           5 months ago DM (diabetes mellitus), type 2 with peripheral vascular complications [facility])    [facility] [place], Marsa PARAS, DO              Passed - CBC within normal limits and completed in the last 12 months    WBC  Date Value Ref Range Status  07/27/2023 3.8 3.8 - 10.8 Thousand/uL Final   RBC  Date Value Ref Range Status  07/27/2023 4.47 4.20 - 5.80 Million/uL Final   Hemoglobin  Date Value Ref Range Status  07/27/2023 9.0 (L) 13.2 - 17.1 g/dL Final  87/93/7982 86.0 13.0 - 17.7 g/dL Final    Comment:                  **Please note reference interval change**   HCT  Date Value Ref Range Status  07/27/2023 31.7 (L) 38.5 -  50.0 % Final   Hematocrit  Date Value Ref Range Status  06/09/2016 41.5 37.5 - 51.0 % Final   MCHC  Date Value Ref Range Status  07/27/2023 28.4 (L) 32.0 - 36.0 g/dL Final    Comment:    For adults, a slight decrease in the calculated MCHC value (in the range of 30 to 32 g/dL) is most likely not clinically significant; however, it should be interpreted with caution in correlation with other red cell parameters and the patient's clinical condition.    [facility]  Date Value Ref Range Status  07/27/2023 20.1 (L) 27.0 - 33.0 pg Final   MCV  Date Value Ref Range Status  07/27/2023 70.9 (L) 80.0 - 100.0 fL Final  06/09/2016 83 79 - 97 fL Final   No results found for: PLTCOUNTKUC, LABPLAT, POCPLA RDW  Date Value Ref Range Status  07/27/2023 21.1 (H) 11.0 - 15.0 % Final  06/09/2016 16.6 (H) 12.3 - 15.4 % Final

## 2024-07-30 ENCOUNTER — Other Ambulatory Visit: Payer: Self-pay | Admitting: Cardiovascular Disease

## 2024-07-30 DIAGNOSIS — I2510 Atherosclerotic heart disease of native coronary artery without angina pectoris: Secondary | ICD-10-CM

## 2024-08-06 ENCOUNTER — Ambulatory Visit: Payer: Self-pay

## 2024-08-08 ENCOUNTER — Telehealth: Payer: Self-pay

## 2024-08-08 NOTE — Telephone Encounter (Signed)
 Copied from CRM #[id number]. Topic: Clinical - Request for Lab/Test Order >> Aug 07, 2024  4:17 PM Revonda D wrote: Reason for CRM: Pt is requesting to have labs completed and needs to have the orders submitted. Pt would like a callback with an update and to get the appt scheduled.    ----------------------------------------------------------------------- From previous Reason for Contact - Red Word Triage: Red Word that prompted transfer to Nurse Triage:   Please warm transfer Red Word call to Nurse Triage and then click Send Message and Close CRM.

## 2024-08-08 NOTE — Telephone Encounter (Signed)
 Left message for patient to return call OK to schedule lab appointment 1 week prior to appointment with DR Edman

## 2024-08-09 ENCOUNTER — Ambulatory Visit: Admitting: Family Medicine

## 2024-09-24 ENCOUNTER — Other Ambulatory Visit

## 2024-10-01 ENCOUNTER — Encounter: Admitting: Family Medicine

## 2024-11-05 ENCOUNTER — Other Ambulatory Visit
